# Patient Record
Sex: Female | Born: 1960
Health system: Southern US, Community
[De-identification: ages and names within clinical notes are randomized; demographics above are authoritative.]

## PROBLEM LIST (undated history)

## (undated) DIAGNOSIS — C801 Malignant (primary) neoplasm, unspecified: Secondary | ICD-10-CM

## (undated) DIAGNOSIS — N950 Postmenopausal bleeding: Secondary | ICD-10-CM

## (undated) DIAGNOSIS — Z803 Family history of malignant neoplasm of breast: Secondary | ICD-10-CM

## (undated) DIAGNOSIS — M419 Scoliosis, unspecified: Secondary | ICD-10-CM

## (undated) DIAGNOSIS — Z808 Family history of malignant neoplasm of other organs or systems: Secondary | ICD-10-CM

## (undated) DIAGNOSIS — Z9889 Other specified postprocedural states: Secondary | ICD-10-CM

## (undated) DIAGNOSIS — R112 Nausea with vomiting, unspecified: Secondary | ICD-10-CM

## (undated) DIAGNOSIS — Z923 Personal history of irradiation: Secondary | ICD-10-CM

## (undated) DIAGNOSIS — Z973 Presence of spectacles and contact lenses: Secondary | ICD-10-CM

## (undated) DIAGNOSIS — Z800 Family history of malignant neoplasm of digestive organs: Secondary | ICD-10-CM

## (undated) DIAGNOSIS — I10 Essential (primary) hypertension: Secondary | ICD-10-CM

## (undated) HISTORY — DX: Family history of malignant neoplasm of breast: Z80.3

## (undated) HISTORY — PX: DILATION AND CURETTAGE OF UTERUS: SHX78

## (undated) HISTORY — PX: AUGMENTATION MAMMAPLASTY: SUR837

## (undated) HISTORY — DX: Family history of malignant neoplasm of other organs or systems: Z80.8

## (undated) HISTORY — DX: Essential (primary) hypertension: I10

## (undated) HISTORY — PX: BREAST SURGERY: SHX581

## (undated) HISTORY — PX: TONSILLECTOMY: SUR1361

## (undated) HISTORY — DX: Family history of malignant neoplasm of digestive organs: Z80.0

## (undated) HISTORY — PX: REDUCTION MAMMAPLASTY: SUR839

---
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
## 1998-02-25 ENCOUNTER — Other Ambulatory Visit: Admission: RE | Admit: 1998-02-25 | Discharge: 1998-02-25 | Payer: Self-pay | Admitting: *Deleted

## 1998-07-26 ENCOUNTER — Inpatient Hospital Stay (HOSPITAL_COMMUNITY): Admission: AD | Admit: 1998-07-26 | Discharge: 1998-07-26 | Payer: Self-pay | Admitting: *Deleted

## 1998-11-09 ENCOUNTER — Encounter: Payer: Self-pay | Admitting: Emergency Medicine

## 1998-11-09 ENCOUNTER — Emergency Department (HOSPITAL_COMMUNITY): Admission: EM | Admit: 1998-11-09 | Discharge: 1998-11-09 | Payer: Self-pay | Admitting: Internal Medicine

## 1998-11-26 ENCOUNTER — Encounter: Admission: RE | Admit: 1998-11-26 | Discharge: 1999-02-24 | Payer: Self-pay | Admitting: *Deleted

## 1998-12-30 ENCOUNTER — Inpatient Hospital Stay (HOSPITAL_COMMUNITY): Admission: AD | Admit: 1998-12-30 | Discharge: 1998-12-30 | Payer: Self-pay | Admitting: Obstetrics and Gynecology

## 1999-01-17 ENCOUNTER — Inpatient Hospital Stay (HOSPITAL_COMMUNITY): Admission: AD | Admit: 1999-01-17 | Discharge: 1999-01-17 | Payer: Self-pay | Admitting: Obstetrics and Gynecology

## 1999-01-31 ENCOUNTER — Inpatient Hospital Stay (HOSPITAL_COMMUNITY): Admission: AD | Admit: 1999-01-31 | Discharge: 1999-02-03 | Payer: Self-pay | Admitting: Obstetrics & Gynecology

## 1999-03-11 ENCOUNTER — Other Ambulatory Visit: Admission: RE | Admit: 1999-03-11 | Discharge: 1999-03-11 | Payer: Self-pay | Admitting: *Deleted

## 2000-03-10 ENCOUNTER — Other Ambulatory Visit: Admission: RE | Admit: 2000-03-10 | Discharge: 2000-03-10 | Payer: Self-pay | Admitting: *Deleted

## 2001-03-29 ENCOUNTER — Other Ambulatory Visit: Admission: RE | Admit: 2001-03-29 | Discharge: 2001-03-29 | Payer: Self-pay | Admitting: *Deleted

## 2002-04-25 ENCOUNTER — Other Ambulatory Visit: Admission: RE | Admit: 2002-04-25 | Discharge: 2002-04-25 | Payer: Self-pay | Admitting: Obstetrics and Gynecology

## 2002-09-20 ENCOUNTER — Other Ambulatory Visit: Admission: RE | Admit: 2002-09-20 | Discharge: 2002-09-20 | Payer: Self-pay | Admitting: Obstetrics and Gynecology

## 2003-04-16 ENCOUNTER — Encounter: Payer: Self-pay | Admitting: Obstetrics and Gynecology

## 2003-04-16 ENCOUNTER — Ambulatory Visit (HOSPITAL_COMMUNITY): Admission: RE | Admit: 2003-04-16 | Discharge: 2003-04-16 | Payer: Self-pay | Admitting: Obstetrics and Gynecology

## 2003-04-18 ENCOUNTER — Encounter: Admission: RE | Admit: 2003-04-18 | Discharge: 2003-04-18 | Payer: Self-pay | Admitting: Obstetrics and Gynecology

## 2003-04-18 ENCOUNTER — Encounter: Payer: Self-pay | Admitting: Obstetrics and Gynecology

## 2003-05-10 ENCOUNTER — Other Ambulatory Visit: Admission: RE | Admit: 2003-05-10 | Discharge: 2003-05-10 | Payer: Self-pay | Admitting: Obstetrics and Gynecology

## 2004-05-19 ENCOUNTER — Encounter: Admission: RE | Admit: 2004-05-19 | Discharge: 2004-05-19 | Payer: Self-pay | Admitting: Obstetrics and Gynecology

## 2004-05-27 ENCOUNTER — Encounter: Admission: RE | Admit: 2004-05-27 | Discharge: 2004-05-27 | Payer: Self-pay | Admitting: Obstetrics and Gynecology

## 2005-06-11 ENCOUNTER — Encounter: Admission: RE | Admit: 2005-06-11 | Discharge: 2005-06-11 | Payer: Self-pay | Admitting: Obstetrics and Gynecology

## 2006-06-16 ENCOUNTER — Encounter: Admission: RE | Admit: 2006-06-16 | Discharge: 2006-06-16 | Payer: Self-pay | Admitting: Obstetrics and Gynecology

## 2006-07-05 ENCOUNTER — Encounter: Admission: RE | Admit: 2006-07-05 | Discharge: 2006-07-05 | Payer: Self-pay | Admitting: Obstetrics and Gynecology

## 2006-07-13 ENCOUNTER — Encounter: Admission: RE | Admit: 2006-07-13 | Discharge: 2006-07-13 | Payer: Self-pay | Admitting: Obstetrics and Gynecology

## 2006-07-27 ENCOUNTER — Encounter: Admission: RE | Admit: 2006-07-27 | Discharge: 2006-07-27 | Payer: Self-pay | Admitting: General Surgery

## 2006-07-30 ENCOUNTER — Ambulatory Visit (HOSPITAL_BASED_OUTPATIENT_CLINIC_OR_DEPARTMENT_OTHER): Admission: RE | Admit: 2006-07-30 | Discharge: 2006-07-30 | Payer: Self-pay | Admitting: General Surgery

## 2006-07-30 ENCOUNTER — Encounter (INDEPENDENT_AMBULATORY_CARE_PROVIDER_SITE_OTHER): Payer: Self-pay | Admitting: Specialist

## 2006-07-30 ENCOUNTER — Encounter: Admission: RE | Admit: 2006-07-30 | Discharge: 2006-07-30 | Payer: Self-pay | Admitting: General Surgery

## 2007-07-27 ENCOUNTER — Encounter: Admission: RE | Admit: 2007-07-27 | Discharge: 2007-07-27 | Payer: Self-pay | Admitting: Obstetrics and Gynecology

## 2008-07-30 ENCOUNTER — Encounter: Admission: RE | Admit: 2008-07-30 | Discharge: 2008-07-30 | Payer: Self-pay | Admitting: Obstetrics and Gynecology

## 2009-07-31 ENCOUNTER — Encounter: Admission: RE | Admit: 2009-07-31 | Discharge: 2009-07-31 | Payer: Self-pay | Admitting: Obstetrics and Gynecology

## 2009-08-07 ENCOUNTER — Encounter: Admission: RE | Admit: 2009-08-07 | Discharge: 2009-08-07 | Payer: Self-pay | Admitting: Obstetrics and Gynecology

## 2010-02-26 ENCOUNTER — Encounter: Admission: RE | Admit: 2010-02-26 | Discharge: 2010-02-26 | Payer: Self-pay | Admitting: Obstetrics and Gynecology

## 2010-06-30 ENCOUNTER — Encounter: Payer: Self-pay | Admitting: Internal Medicine

## 2010-09-17 ENCOUNTER — Encounter
Admission: RE | Admit: 2010-09-17 | Discharge: 2010-09-17 | Payer: Self-pay | Source: Home / Self Care | Attending: Obstetrics and Gynecology | Admitting: Obstetrics and Gynecology

## 2010-09-30 NOTE — Letter (Signed)
Summary: Pre Visit Letter Revised  [facility]  [address] [place], [place] [address]   Phone: [phone]  Fax: [phone]        06/30/2010 MRN: [id number]  [facility] [address] [place], [place]  [address]             Procedure Date:  12-14 at 9:30am           Dr Dalene Carrow to the Gastroenterology Division at [facility].    You are scheduled to see a nurse for your pre-procedure visit on 07-30-10 at 11am on the 3rd floor at [facility], [address]  We ask that you try to arrive at our office 15 minutes prior to your appointment time to allow for check-in.  Please take a minute to review the attached form.  If you answer "Yes" to one or more of the questions on the first page, we ask that you call the person listed at your earliest opportunity.  If you answer "No" to all of the questions, please complete the rest of the form and bring it to your appointment.    Your nurse visit will consist of discussing your medical and surgical history, your immediate family medical history, and your medications.   If you are unable to list all of your medications on the form, please bring the medication bottles to your appointment and we will list them.  We will need to be aware of both prescribed and over the counter drugs.  We will need to know exact dosage information as well.    Please be prepared to read and sign documents such as consent forms, a financial agreement, and acknowledgement forms.  If necessary, and with your consent, a friend or relative is welcome to sit-in on the nurse visit with you.  Please bring your insurance card so that we may make a copy of it.  If your insurance requires a referral to see a specialist, please bring your referral form from your primary care physician.  No co-pay is required for this nurse visit.     If you cannot keep your appointment, please call [phone] to cancel or reschedule prior to your  appointment date.  This allows [place] the opportunity to schedule an appointment for another patient in need of care.    Thank you for choosing [facility] for your medical needs.  We appreciate the opportunity to care for you.  Please visit [place] at our website  to learn more about our practice.  Sincerely, The Gastroenterology Division

## 2011-01-16 NOTE — Op Note (Signed)
[name], Graham             ACCOUNT NO.:  [id number]   MEDICAL RECORD NO.:  [id number]          PATIENT TYPE:  AMB   LOCATION:  DSC                          FACILITY:  [facility]   PHYSICIAN:  Angelia Mould. Derrell Lolling, M.D.DATE OF BIRTH:  Feb 20, 1961   DATE OF PROCEDURE:  07/30/2006  DATE OF DISCHARGE:                               OPERATIVE REPORT   PREOPERATIVE DIAGNOSIS:  Atypical ductal hyperplasia, right breast.   POSTOPERATIVE DIAGNOSIS:  Atypical ductal hyperplasia, right breast.   OPERATION PERFORMED:  Excisional biopsy calcifications, right breast.   SURGEON:  Angelia Mould. Derrell Lolling, M.D.   OPERATIVE INDICATIONS:  This is a 50 year old white female who had  screening mammograms which showed suspicious calcifications in the right  breast upper outer quadrant.  She had vacuum-assisted large core needle  biopsy of the right breast upper outer quadrant showing fibrocystic  change with microcalcifications and focal atypical ductal hyperplasia.  There was no evidence of ductal carcinoma in situ.  Dr. Cain Saupe  was concerned about sampling her and asked [place] to consider excision of  this area.  The patient has been examined and counseled as an  outpatient.  I told the patient this was a reasonable approach, and she  was brought to operating room electively.   OPERATIVE TECHNIQUE:  The patient underwent wire guide needle  localization at [facility] by Dr. Adriana Reams this  morning.  The wire was in good position.  The films were reviewed  preoperatively.  The patient was taken to operating room.  She underwent  monitoring and fairly significant sedation by the anesthesia department.  The right breast was prepped and draped in sterile fashion.  One percent  Xylocaine with epinephrine was used as a local infiltration anesthetic.  The insertion wire was in the upper outer quadrant laterally.  I made a  curved incision in the upper outer quadrant near the wire.   This  incision paralleled the areolar margin which was more medially located.  Dissection was carried down into the breast tissue and around the wire  guide.  The specimen was marked with silk sutures to mark superficial  and lateral margins.  Specimen mammography was performed.  Dr. Adriana Reams called back and said that we had completely excised the area in  question.  The specimen was sent to pathology.  Hemostasis was excellent  and achieved with electrocautery.  The wound was irrigated with saline.  The deeper breast tissues were closed with interrupted sutures of 3-0  Vicryl and  the skin closed with a running subcuticular suture of 4-0 Monocryl and  Steri-Strips.  Clean bandages were placed and the patient taken to the  recovery room in stable condition.  Estimated blood loss was about 10 to  15 mL.  Complications none.  Sponge, needle and instrument counts were  correct.      Angelia Mould. Derrell Lolling, M.D.  Electronically Signed     HMI/MEDQ  D:  07/30/2006  T:  07/31/2006  Job:  [id number]   cc:   Burnell Blanks, M.D.  [facility]

## 2011-09-08 ENCOUNTER — Other Ambulatory Visit: Payer: Self-pay | Admitting: Obstetrics and Gynecology

## 2011-09-08 DIAGNOSIS — Z1231 Encounter for screening mammogram for malignant neoplasm of breast: Secondary | ICD-10-CM

## 2011-10-01 ENCOUNTER — Ambulatory Visit
Admission: RE | Admit: 2011-10-01 | Discharge: 2011-10-01 | Disposition: A | Discharge status: Home / Self Care | Payer: BC Managed Care – PPO | Source: Ambulatory Visit | Attending: Obstetrics and Gynecology | Admitting: Obstetrics and Gynecology

## 2011-10-01 DIAGNOSIS — Z1231 Encounter for screening mammogram for malignant neoplasm of breast: Secondary | ICD-10-CM

## 2012-09-27 ENCOUNTER — Other Ambulatory Visit: Payer: Self-pay | Admitting: Obstetrics and Gynecology

## 2012-09-27 DIAGNOSIS — Z1231 Encounter for screening mammogram for malignant neoplasm of breast: Secondary | ICD-10-CM

## 2012-10-21 ENCOUNTER — Ambulatory Visit
Admission: RE | Admit: 2012-10-21 | Discharge: 2012-10-21 | Disposition: A | Discharge status: Home / Self Care | Payer: BC Managed Care – PPO | Source: Ambulatory Visit | Attending: Obstetrics and Gynecology | Admitting: Obstetrics and Gynecology

## 2012-10-21 DIAGNOSIS — Z1231 Encounter for screening mammogram for malignant neoplasm of breast: Secondary | ICD-10-CM

## 2012-10-24 ENCOUNTER — Other Ambulatory Visit: Payer: Self-pay | Admitting: Obstetrics and Gynecology

## 2012-10-24 DIAGNOSIS — R928 Other abnormal and inconclusive findings on diagnostic imaging of breast: Secondary | ICD-10-CM

## 2012-11-17 ENCOUNTER — Ambulatory Visit
Admission: RE | Admit: 2012-11-17 | Discharge: 2012-11-17 | Disposition: A | Discharge status: Home / Self Care | Payer: BC Managed Care – PPO | Source: Ambulatory Visit | Attending: Obstetrics and Gynecology | Admitting: Obstetrics and Gynecology

## 2012-11-17 DIAGNOSIS — R928 Other abnormal and inconclusive findings on diagnostic imaging of breast: Secondary | ICD-10-CM

## 2012-11-21 ENCOUNTER — Other Ambulatory Visit: Payer: Self-pay | Admitting: Obstetrics and Gynecology

## 2012-11-21 DIAGNOSIS — R920 Mammographic microcalcification found on diagnostic imaging of breast: Secondary | ICD-10-CM

## 2012-12-12 ENCOUNTER — Inpatient Hospital Stay
Admission: RE | Admit: 2012-12-12 | Discharge: 2012-12-12 | Disposition: A | Discharge status: Home / Self Care | Payer: BC Managed Care – PPO | Source: Ambulatory Visit | Attending: Obstetrics and Gynecology | Admitting: Obstetrics and Gynecology

## 2013-04-20 ENCOUNTER — Other Ambulatory Visit: Payer: Self-pay | Admitting: Obstetrics and Gynecology

## 2013-04-20 DIAGNOSIS — R920 Mammographic microcalcification found on diagnostic imaging of breast: Secondary | ICD-10-CM

## 2013-05-15 ENCOUNTER — Ambulatory Visit
Admission: RE | Admit: 2013-05-15 | Discharge: 2013-05-15 | Disposition: A | Discharge status: Home / Self Care | Payer: BC Managed Care – PPO | Source: Ambulatory Visit | Attending: Obstetrics and Gynecology | Admitting: Obstetrics and Gynecology

## 2013-05-15 DIAGNOSIS — R920 Mammographic microcalcification found on diagnostic imaging of breast: Secondary | ICD-10-CM

## 2013-10-20 ENCOUNTER — Other Ambulatory Visit: Payer: Self-pay | Admitting: Obstetrics and Gynecology

## 2013-10-20 DIAGNOSIS — R921 Mammographic calcification found on diagnostic imaging of breast: Secondary | ICD-10-CM

## 2013-11-13 ENCOUNTER — Other Ambulatory Visit: Payer: Self-pay | Admitting: Obstetrics and Gynecology

## 2013-11-13 ENCOUNTER — Ambulatory Visit
Admission: RE | Admit: 2013-11-13 | Discharge: 2013-11-13 | Disposition: A | Discharge status: Home / Self Care | Payer: BC Managed Care – PPO | Source: Ambulatory Visit | Attending: Obstetrics and Gynecology | Admitting: Obstetrics and Gynecology

## 2013-11-13 DIAGNOSIS — R921 Mammographic calcification found on diagnostic imaging of breast: Secondary | ICD-10-CM

## 2013-11-20 ENCOUNTER — Ambulatory Visit
Admission: RE | Admit: 2013-11-20 | Discharge: 2013-11-20 | Disposition: A | Discharge status: Home / Self Care | Payer: BC Managed Care – PPO | Source: Ambulatory Visit | Attending: Obstetrics and Gynecology | Admitting: Obstetrics and Gynecology

## 2013-11-20 DIAGNOSIS — R921 Mammographic calcification found on diagnostic imaging of breast: Secondary | ICD-10-CM

## 2013-11-21 ENCOUNTER — Other Ambulatory Visit: Payer: Self-pay | Admitting: Obstetrics and Gynecology

## 2013-11-21 ENCOUNTER — Other Ambulatory Visit (HOSPITAL_COMMUNITY): Payer: Self-pay | Admitting: Diagnostic Radiology

## 2013-11-21 DIAGNOSIS — N6091 Unspecified benign mammary dysplasia of right breast: Secondary | ICD-10-CM

## 2013-11-28 ENCOUNTER — Ambulatory Visit (INDEPENDENT_AMBULATORY_CARE_PROVIDER_SITE_OTHER): Payer: BC Managed Care – PPO | Admitting: Surgery

## 2013-11-29 ENCOUNTER — Other Ambulatory Visit: Payer: BC Managed Care – PPO

## 2014-01-02 ENCOUNTER — Other Ambulatory Visit (INDEPENDENT_AMBULATORY_CARE_PROVIDER_SITE_OTHER): Payer: Self-pay | Admitting: General Surgery

## 2014-01-02 ENCOUNTER — Encounter (INDEPENDENT_AMBULATORY_CARE_PROVIDER_SITE_OTHER): Payer: Self-pay | Admitting: General Surgery

## 2014-01-02 ENCOUNTER — Ambulatory Visit (INDEPENDENT_AMBULATORY_CARE_PROVIDER_SITE_OTHER): Payer: BC Managed Care – PPO | Admitting: General Surgery

## 2014-01-02 VITALS — BP 126/80 | HR 77 | Temp 98.5°F | Ht 63.0 in | Wt 165.0 lb

## 2014-01-02 DIAGNOSIS — N6091 Unspecified benign mammary dysplasia of right breast: Secondary | ICD-10-CM | POA: Insufficient documentation

## 2014-01-02 DIAGNOSIS — N6089 Other benign mammary dysplasias of unspecified breast: Secondary | ICD-10-CM

## 2014-01-04 NOTE — Progress Notes (Signed)
Patient ID: Natasha Graham, female   DOB: 09-14-1960, 53 y.o.   MRN: [id number]  Chief Complaint  Patient presents with  . eval right breast    HPI Natasha Graham is a 53 y.o. female.  Referred by Dr Wandalee Ferdinand HPI 53 yof who has multiple mammograms that I reviewed. She has had followup for quite a while. She did have a stereotactic biopsy of right breast calcifications in November 2007 with a subsequent wire localization of some of these calcifications which showed atypical ductal hyperplasia. She was not evaluated for any risk reduction measures at that time. She underwent a mammogram last year that showed  breast density C. This was read as a BI-RADS 3 mammogram with calcifications in the outer right breast that have remained stable. She was discussed an MRI at that point. She was then seen in March of 2015 with some calcifications in the lateral right breast changed. This underwent a biopsy that showed atypical ductal hyperplasia. She was also recommended an MRI again. She comes down today to discuss this. The entire area of calcification measures 7.8 x 3.4 x 1.2 cm.  Past Medical History  Diagnosis Date  . Hypertension     Past Surgical History  Procedure Laterality Date  . Brain surgery    . Scoliosis  1972    Family History  Problem Relation Age of Onset  . Cancer Father     liver    Social History History  Substance Use Topics  . Smoking status: Never Smoker   . Smokeless tobacco: Not on file  . Alcohol Use: No    No Known Allergies  Current Outpatient Prescriptions  Medication Sig Dispense Refill  . CINNAMON PO Take by mouth.      . lactobacillus acidophilus (BACID) TABS tablet Take 2 tablets by mouth 3 (three) times daily.      Marland Kitchen lisinopril-hydrochlorothiazide (PRINZIDE,ZESTORETIC) 20-25 MG per tablet       . Multiple Vitamin (MULTIVITAMIN) tablet Take 1 tablet by mouth daily.      . Turmeric 500 MG CAPS Take by mouth.       No current facility-administered  medications for this visit.    Review of Systems Review of Systems  Constitutional: Negative for fever, chills and unexpected weight change.  HENT: Negative for congestion, hearing loss, sore throat, trouble swallowing and voice change.   Eyes: Negative for visual disturbance.  Respiratory: Negative for cough and wheezing.   Cardiovascular: Negative for chest pain, palpitations and leg swelling.  Gastrointestinal: Negative for nausea, vomiting, abdominal pain, diarrhea, constipation, blood in stool, abdominal distention and anal bleeding.  Genitourinary: Negative for hematuria, vaginal bleeding and difficulty urinating.  Musculoskeletal: Negative for arthralgias.  Skin: Negative for rash and wound.  Neurological: Negative for seizures, syncope and headaches.  Hematological: Negative for adenopathy. Does not bruise/bleed easily.  Psychiatric/Behavioral: Negative for confusion.    Blood pressure 126/80, pulse 77, temperature 98.5 F (36.9 C), height 5\' 3"  (1.6 m), weight 165 lb (74.844 kg).  Physical Exam Physical Exam  Vitals reviewed. Constitutional: She appears well-developed and well-nourished.  Eyes: No scleral icterus.  Cardiovascular: Normal rate, regular rhythm and normal heart sounds.   Pulmonary/Chest: Effort normal and breath sounds normal. She has no wheezes. She has no rales. Right breast exhibits no inverted nipple, no mass, no nipple discharge, no skin change and no tenderness. Left breast exhibits no inverted nipple, no mass, no nipple discharge, no skin change and no tenderness.  Lymphadenopathy:    She  has no cervical adenopathy.    She has no axillary adenopathy.       Right: No supraclavicular adenopathy present.       Left: No supraclavicular adenopathy present.    Data Reviewed  ADDENDUM REPORT: 01/02/2014 14:16  ADDENDUM:  The patient's imaging and biopsy results were reviewed with Dr.  Donne Hazel on 01/12/2014. The currently demonstrated biopsy marker   clip is at the anterior, lateral aspect of a 2.2 x 1.7 x 1.2 cm  group of a large number of tiny microcalcifications deep in the  upper-outer quadrant of the right breast. This is the area of recent  concern, biopsied with stereotactic guidance, yielding atypical  ductal hyperplasia. Surgical excision of this group of  microcalcifications is recommended.  Additional calcifications elsewhere in the upper outer right breast,  extending to the retroareolar region, are mammographically stable  over a long period of time time with previous stereotactic and  excisional biopsies yielding atypical ductal hyperplasia. The  recently biopsied calcifications and previously biopsied  calcifications together span an area measuring 7.8 x 3.4 x 1.2 cm in  maximum dimensions.  Electronically Signed  By: Enrique Sack M.D.  On: 01/02/2014 14:16        Arline Asp, MD Tue Nov 21, 2013 10:26:29 AM EDT       ADDENDUM REPORT: 11/21/2013 10:24  ADDENDUM:  I spoke with the patient by telephone on 11/21/2013 at 10:15 a.m. to  discuss her pathology results. Pathology demonstrates calcifications  with atypical ductal hyperplasia, which is concordant with the  imaging appearance. MRI is recommended for optimal surgical planning  purposes given the extent of mammographically evident  calcifications. The patient reports a previous MRI that she is found  to be a challenging experience but I described the procedure in  detail and she feels she will be able to complete it without  difficulty. Surgical consultation has been scheduled with Dr. Dalbert Batman  on 12/05/2013 at 3:40 p.m. All questions were answered. She will be  contacted regarding MRI schedule time.     EXAM:  DIGITAL DIAGNOSTIC RIGHT MAMMOGRAM WITH CAD  COMPARISON: May 15, 2013, November 25, 2012, October 21, 2012,  October 01, 2011, September 17, 2010  ACR Breast Density Category b: There are scattered areas of  fibroglandular density.   FINDINGS:  Cc and MLO views of bilateral breasts, spot magnification cc and  lateral views of the right breast are submitted. There is a grouping  of indeterminate microcalcifications in the lateral right breast.  Mammographic images were processed with CAD.  IMPRESSION:  Suspicious findings.  RECOMMENDATION:  Stereotactic core biopsy right breast calcifications.   Assessment    Right breast adh   Plan     Right breast radioactive seed guided excisional biopsy of the suspicious calcifications  I reviewed all of her films with Dr. Joneen Caraway in the breast center. There are a number of calcifications in the outer right breast that remained stable but a smaller area that have changed. These are the areas of atypical ductal hyperplasia on biopsy. I don't think that an MRI is necessary. I don't know that that will improve any diagnostic capability with this. She also does not want to undergo an MRI for a variety of reasons including some concerns about her rods. She would rather not have this done. We're not going to proceed with an MRI. We discussed proceeding with a localized excision of these abnormal calcifications and then decided where to go after this. We discussed  the risks and benefits associated with this.       Rolm Bookbinder 01/04/2014, 2:52 PM

## 2014-02-20 ENCOUNTER — Encounter (HOSPITAL_BASED_OUTPATIENT_CLINIC_OR_DEPARTMENT_OTHER): Payer: Self-pay | Admitting: *Deleted

## 2014-02-20 NOTE — Progress Notes (Signed)
To come for labs and ekg Friday when she comes for seeds

## 2014-02-23 ENCOUNTER — Ambulatory Visit
Admission: RE | Admit: 2014-02-23 | Discharge: 2014-02-23 | Disposition: A | Discharge status: Home / Self Care | Payer: BC Managed Care – PPO | Source: Ambulatory Visit | Attending: General Surgery | Admitting: General Surgery

## 2014-02-23 ENCOUNTER — Encounter (HOSPITAL_BASED_OUTPATIENT_CLINIC_OR_DEPARTMENT_OTHER)
Admission: RE | Admit: 2014-02-23 | Discharge: 2014-02-23 | Disposition: A | Discharge status: Home / Self Care | Payer: BC Managed Care – PPO | Source: Ambulatory Visit | Attending: General Surgery | Admitting: General Surgery

## 2014-02-23 DIAGNOSIS — N6091 Unspecified benign mammary dysplasia of right breast: Secondary | ICD-10-CM

## 2014-02-23 LAB — CBC WITH DIFFERENTIAL/PLATELET
Basophils Absolute: 0 10*3/uL (ref 0.0–0.1)
Basophils Relative: 1 % (ref 0–1)
Eosinophils Absolute: 0.1 10*3/uL (ref 0.0–0.7)
Eosinophils Relative: 2 % (ref 0–5)
HEMATOCRIT: 38 % (ref 36.0–46.0)
HEMOGLOBIN: 13.2 g/dL (ref 12.0–15.0)
LYMPHS ABS: 1.7 10*3/uL (ref 0.7–4.0)
Lymphocytes Relative: 29 % (ref 12–46)
MCH: 31.3 pg (ref 26.0–34.0)
MCHC: 34.7 g/dL (ref 30.0–36.0)
MCV: 90 fL (ref 78.0–100.0)
MONOS PCT: 7 % (ref 3–12)
Monocytes Absolute: 0.4 10*3/uL (ref 0.1–1.0)
NEUTROS ABS: 3.7 10*3/uL (ref 1.7–7.7)
Neutrophils Relative %: 61 % (ref 43–77)
Platelets: 271 10*3/uL (ref 150–400)
RBC: 4.22 MIL/uL (ref 3.87–5.11)
RDW: 12 % (ref 11.5–15.5)
WBC: 6 10*3/uL (ref 4.0–10.5)

## 2014-02-23 LAB — BASIC METABOLIC PANEL
BUN: 9 mg/dL (ref 6–23)
CHLORIDE: 101 meq/L (ref 96–112)
CO2: 30 mEq/L (ref 19–32)
Calcium: 9.1 mg/dL (ref 8.4–10.5)
Creatinine, Ser: 0.74 mg/dL (ref 0.50–1.10)
GFR calc non Af Amer: >90 mL/min (ref >90)
GLUCOSE: 108 mg/dL — AB (ref 70–99)
POTASSIUM: 4 meq/L (ref 3.7–5.3)
Sodium: 142 mEq/L (ref 137–147)

## 2014-02-26 ENCOUNTER — Ambulatory Visit (HOSPITAL_BASED_OUTPATIENT_CLINIC_OR_DEPARTMENT_OTHER)
Admission: RE | Admit: 2014-02-26 | Discharge: 2014-02-26 | Disposition: A | Discharge status: Home / Self Care | Payer: BC Managed Care – PPO | Source: Ambulatory Visit | Attending: General Surgery | Admitting: General Surgery

## 2014-02-26 ENCOUNTER — Ambulatory Visit
Admission: RE | Admit: 2014-02-26 | Discharge: 2014-02-26 | Disposition: A | Discharge status: Home / Self Care | Payer: BC Managed Care – PPO | Source: Ambulatory Visit | Attending: General Surgery | Admitting: General Surgery

## 2014-02-26 ENCOUNTER — Ambulatory Visit (HOSPITAL_BASED_OUTPATIENT_CLINIC_OR_DEPARTMENT_OTHER): Payer: BC Managed Care – PPO | Admitting: Certified Registered"

## 2014-02-26 ENCOUNTER — Encounter (HOSPITAL_BASED_OUTPATIENT_CLINIC_OR_DEPARTMENT_OTHER): Payer: Self-pay

## 2014-02-26 ENCOUNTER — Encounter (HOSPITAL_BASED_OUTPATIENT_CLINIC_OR_DEPARTMENT_OTHER): Payer: BC Managed Care – PPO | Admitting: Certified Registered"

## 2014-02-26 ENCOUNTER — Encounter (HOSPITAL_BASED_OUTPATIENT_CLINIC_OR_DEPARTMENT_OTHER)
Admission: RE | Disposition: A | Discharge status: Home / Self Care | Payer: Self-pay | Source: Ambulatory Visit | Attending: General Surgery

## 2014-02-26 DIAGNOSIS — I10 Essential (primary) hypertension: Secondary | ICD-10-CM | POA: Insufficient documentation

## 2014-02-26 DIAGNOSIS — Z79899 Other long term (current) drug therapy: Secondary | ICD-10-CM | POA: Insufficient documentation

## 2014-02-26 DIAGNOSIS — N6091 Unspecified benign mammary dysplasia of right breast: Secondary | ICD-10-CM

## 2014-02-26 DIAGNOSIS — D0590 Unspecified type of carcinoma in situ of unspecified breast: Secondary | ICD-10-CM | POA: Insufficient documentation

## 2014-02-26 HISTORY — DX: Presence of spectacles and contact lenses: Z97.3

## 2014-02-26 SURGERY — RADIOACTIVE SEED GUIDED BREAST BIOPSY
Anesthesia: General | Site: Breast | Laterality: Right

## 2014-02-26 MED ORDER — LACTATED RINGERS IV SOLN
INTRAVENOUS | Status: DC | PRN
  Administered 2014-02-26 (×2): via INTRAVENOUS

## 2014-02-26 MED ORDER — OXYCODONE-ACETAMINOPHEN 10-325 MG PO TABS: 1.0000 | ORAL_TABLET | Freq: Four times a day (QID) | ORAL | Status: DC | PRN

## 2014-02-26 MED ORDER — FENTANYL CITRATE 0.05 MG/ML IJ SOLN
INTRAMUSCULAR | Status: DC | PRN
  Administered 2014-02-26 (×2): 25 ug via INTRAVENOUS
  Administered 2014-02-26: 50 ug via INTRAVENOUS
  Administered 2014-02-26: 25 ug via INTRAVENOUS

## 2014-02-26 MED ORDER — CEFAZOLIN SODIUM-DEXTROSE 2-3 GM-% IV SOLR
2.0000 g | INTRAVENOUS | Status: AC
  Administered 2014-02-26: 2 g via INTRAVENOUS

## 2014-02-26 MED ORDER — LIDOCAINE HCL (CARDIAC) 20 MG/ML IV SOLN
INTRAVENOUS | Status: DC | PRN
  Administered 2014-02-26: 60 mg via INTRAVENOUS

## 2014-02-26 MED ORDER — PROPOFOL 10 MG/ML IV BOLUS
INTRAVENOUS | Status: DC | PRN
  Administered 2014-02-26: 150 mg via INTRAVENOUS

## 2014-02-26 MED ORDER — ONDANSETRON HCL 4 MG/2ML IJ SOLN
INTRAMUSCULAR | Status: DC | PRN
  Administered 2014-02-26: 4 mg via INTRAVENOUS

## 2014-02-26 MED ORDER — OXYCODONE HCL 5 MG PO TABS: 5.0000 mg | ORAL_TABLET | Freq: Once | ORAL | Status: DC | PRN

## 2014-02-26 MED ORDER — FENTANYL CITRATE 0.05 MG/ML IJ SOLN: 50.0000 ug | INTRAMUSCULAR | Status: DC | PRN

## 2014-02-26 MED ORDER — MIDAZOLAM HCL 2 MG/2ML IJ SOLN: 1.0000 mg | INTRAMUSCULAR | Status: DC | PRN

## 2014-02-26 MED ORDER — OXYCODONE HCL 5 MG/5ML PO SOLN: 5.0000 mg | Freq: Once | ORAL | Status: DC | PRN

## 2014-02-26 MED ORDER — LACTATED RINGERS IV SOLN: INTRAVENOUS | Status: DC

## 2014-02-26 MED ORDER — BUPIVACAINE HCL (PF) 0.25 % IJ SOLN
INTRAMUSCULAR | Status: DC | PRN
  Administered 2014-02-26: 20 mL

## 2014-02-26 MED ORDER — MIDAZOLAM HCL 2 MG/2ML IJ SOLN
INTRAMUSCULAR | Status: AC
  Filled 2014-02-26: qty 2

## 2014-02-26 MED ORDER — PROPOFOL 10 MG/ML IV EMUL
INTRAVENOUS | Status: AC
  Filled 2014-02-26: qty 50

## 2014-02-26 MED ORDER — MIDAZOLAM HCL 5 MG/5ML IJ SOLN
INTRAMUSCULAR | Status: DC | PRN
  Administered 2014-02-26: 2 mg via INTRAVENOUS

## 2014-02-26 MED ORDER — HYDROMORPHONE HCL PF 1 MG/ML IJ SOLN: 0.2500 mg | INTRAMUSCULAR | Status: DC | PRN

## 2014-02-26 MED ORDER — ONDANSETRON HCL 4 MG/2ML IJ SOLN: 4.0000 mg | Freq: Once | INTRAMUSCULAR | Status: DC | PRN

## 2014-02-26 MED ORDER — EPHEDRINE SULFATE 50 MG/ML IJ SOLN
INTRAMUSCULAR | Status: DC | PRN
  Administered 2014-02-26: 10 mg via INTRAVENOUS

## 2014-02-26 MED ORDER — DEXAMETHASONE SODIUM PHOSPHATE 4 MG/ML IJ SOLN
INTRAMUSCULAR | Status: DC | PRN
  Administered 2014-02-26: 10 mg via INTRAVENOUS

## 2014-02-26 MED ORDER — CEFAZOLIN SODIUM-DEXTROSE 2-3 GM-% IV SOLR
INTRAVENOUS | Status: AC
  Filled 2014-02-26: qty 50

## 2014-02-26 MED ORDER — FENTANYL CITRATE 0.05 MG/ML IJ SOLN
INTRAMUSCULAR | Status: AC
  Filled 2014-02-26: qty 6

## 2014-02-26 MED ORDER — BUPIVACAINE HCL (PF) 0.25 % IJ SOLN
INTRAMUSCULAR | Status: AC
  Filled 2014-02-26: qty 30

## 2014-02-26 SURGICAL SUPPLY — 57 items
APPLIER CLIP 9.375 MED OPEN (MISCELLANEOUS)
BENZOIN TINCTURE PRP APPL 2/3 (GAUZE/BANDAGES/DRESSINGS) IMPLANT
BINDER BREAST LRG (GAUZE/BANDAGES/DRESSINGS) ×2 IMPLANT
BINDER BREAST MEDIUM (GAUZE/BANDAGES/DRESSINGS) IMPLANT
BINDER BREAST XLRG (GAUZE/BANDAGES/DRESSINGS) IMPLANT
BINDER BREAST XXLRG (GAUZE/BANDAGES/DRESSINGS) IMPLANT
BLADE SURG 15 STRL LF DISP TIS (BLADE) ×1 IMPLANT
BLADE SURG 15 STRL SS (BLADE) ×1
CANISTER SUC SOCK COL 7IN (MISCELLANEOUS) IMPLANT
CANISTER SUCT 1200ML W/VALVE (MISCELLANEOUS) IMPLANT
CHLORAPREP W/TINT 26ML (MISCELLANEOUS) ×2 IMPLANT
CLIP APPLIE 9.375 MED OPEN (MISCELLANEOUS) IMPLANT
COVER MAYO STAND STRL (DRAPES) ×2 IMPLANT
COVER PROBE W GEL 5X96 (DRAPES) ×2 IMPLANT
COVER TABLE BACK 60X90 (DRAPES) ×2 IMPLANT
DECANTER SPIKE VIAL GLASS SM (MISCELLANEOUS) IMPLANT
DERMABOND ADVANCED (GAUZE/BANDAGES/DRESSINGS) ×1
DERMABOND ADVANCED .7 DNX12 (GAUZE/BANDAGES/DRESSINGS) ×1 IMPLANT
DEVICE DUBIN W/COMP PLATE 8390 (MISCELLANEOUS) ×2 IMPLANT
DRAPE PED LAPAROTOMY (DRAPES) ×2 IMPLANT
DRSG TEGADERM 4X4.75 (GAUZE/BANDAGES/DRESSINGS) IMPLANT
ELECT COATED BLADE 2.86 ST (ELECTRODE) ×2 IMPLANT
ELECT REM PT RETURN 9FT ADLT (ELECTROSURGICAL) ×2
ELECTRODE REM PT RTRN 9FT ADLT (ELECTROSURGICAL) ×1 IMPLANT
GLOVE BIO SURGEON STRL SZ7 (GLOVE) ×4 IMPLANT
GLOVE BIOGEL PI IND STRL 7.0 (GLOVE) ×1 IMPLANT
GLOVE BIOGEL PI IND STRL 7.5 (GLOVE) ×1 IMPLANT
GLOVE BIOGEL PI INDICATOR 7.0 (GLOVE) ×1
GLOVE BIOGEL PI INDICATOR 7.5 (GLOVE) ×1
GLOVE EXAM NITRILE MD LF STRL (GLOVE) ×2 IMPLANT
GLOVE SURG SS PI 7.0 STRL IVOR (GLOVE) ×2 IMPLANT
GOWN STRL REUS W/ TWL LRG LVL3 (GOWN DISPOSABLE) ×2 IMPLANT
GOWN STRL REUS W/TWL LRG LVL3 (GOWN DISPOSABLE) ×2
KIT MARKER MARGIN INK (KITS) ×2 IMPLANT
NEEDLE HYPO 25X1 1.5 SAFETY (NEEDLE) ×2 IMPLANT
NS IRRIG 1000ML POUR BTL (IV SOLUTION) IMPLANT
PACK BASIN DAY SURGERY FS (CUSTOM PROCEDURE TRAY) ×2 IMPLANT
PENCIL BUTTON HOLSTER BLD 10FT (ELECTRODE) ×2 IMPLANT
SHEET MEDIUM DRAPE 40X70 STRL (DRAPES) IMPLANT
SLEEVE SCD COMPRESS KNEE MED (MISCELLANEOUS) ×2 IMPLANT
SPONGE GAUZE 4X4 12PLY STER LF (GAUZE/BANDAGES/DRESSINGS) IMPLANT
SPONGE LAP 4X18 X RAY DECT (DISPOSABLE) ×2 IMPLANT
STAPLER VISISTAT 35W (STAPLE) IMPLANT
STRIP CLOSURE SKIN 1/2X4 (GAUZE/BANDAGES/DRESSINGS) ×2 IMPLANT
SUT MNCRL AB 4-0 PS2 18 (SUTURE) ×2 IMPLANT
SUT MON AB 5-0 PS2 18 (SUTURE) IMPLANT
SUT SILK 2 0 SH (SUTURE) IMPLANT
SUT VIC AB 2-0 SH 27 (SUTURE) ×1
SUT VIC AB 2-0 SH 27XBRD (SUTURE) ×1 IMPLANT
SUT VIC AB 3-0 SH 27 (SUTURE) ×1
SUT VIC AB 3-0 SH 27X BRD (SUTURE) ×1 IMPLANT
SUT VIC AB 5-0 PS2 18 (SUTURE) IMPLANT
SYR CONTROL 10ML LL (SYRINGE) ×2 IMPLANT
TOWEL OR 17X24 6PK STRL BLUE (TOWEL DISPOSABLE) ×2 IMPLANT
TOWEL OR NON WOVEN STRL DISP B (DISPOSABLE) ×2 IMPLANT
TUBE CONNECTING 20X1/4 (TUBING) IMPLANT
YANKAUER SUCT BULB TIP NO VENT (SUCTIONS) IMPLANT

## 2014-02-26 NOTE — H&P (Signed)
37 yof who has multiple mammograms that I reviewed. She has had followup for quite a while. She did have a stereotactic biopsy of right breast calcifications in November 2007 with a subsequent wire localization of some of these calcifications which showed atypical ductal hyperplasia. She was not evaluated for any risk reduction measures at that time. She underwent a mammogram last year that showed breast density C. This was read as a BI-RADS 3 mammogram with calcifications in the outer right breast that have remained stable. She was discussed an MRI at that point. She was then seen in March of 2015 with some calcifications in the lateral right breast changed. This underwent a biopsy that showed atypical ductal hyperplasia. The entire area of calcification measures 7.8 x 3.4 x 1.2 cm.   Past Medical History   Diagnosis  Date   .  Hypertension     Past Surgical History   Procedure  Laterality  Date   .  Brain surgery     .  Scoliosis   1972    Family History   Problem  Relation  Age of Onset   .  Cancer  Father      liver   Social History  History   Substance Use Topics   .  Smoking status:  Never Smoker   .  Smokeless tobacco:  Not on file   .  Alcohol Use:  No   No Known Allergies  Current Outpatient Prescriptions   Medication  Sig  Dispense  Refill   .  CINNAMON PO  Take by mouth.     .  lactobacillus acidophilus (BACID) TABS tablet  Take 2 tablets by mouth 3 (three) times daily.     Marland Kitchen  lisinopril-hydrochlorothiazide (PRINZIDE,ZESTORETIC) 20-25 MG per tablet      .  Multiple Vitamin (MULTIVITAMIN) tablet  Take 1 tablet by mouth daily.     .  Turmeric 500 MG CAPS  Take by mouth.      No current facility-administered medications for this visit.   Review of Systems  Review of Systems  Constitutional: Negative for fever, chills and unexpected weight change.  Respiratory: Negative for cough and wheezing.  Cardiovascular: Negative for chest pain, palpitations and leg swelling.    Blood pressure 126/80, pulse 77, temperature 98.5 F (36.9 C), height 5\' 3"  (1.6 m), weight 165 lb (74.844 kg).  Physical Exam  Physical Exam  Vitals reviewed.  Constitutional: She appears well-developed and well-nourished.  Eyes: No scleral icterus.  Cardiovascular: Normal rate, regular rhythm and normal heart sounds.  Pulmonary/Chest: Effort normal and breath sounds normal. She has no wheezes. She has no rales. Right breast exhibits no inverted nipple, no mass, no nipple discharge, no skin change and no tenderness. Left breast exhibits no inverted nipple, no mass, no nipple discharge, no skin change and no tenderness.   Data Reviewed   ADDENDUM REPORT: 01/02/2014 14:16  ADDENDUM:  The patient's imaging and biopsy results were reviewed with Dr.  Donne Hazel on 01/12/2014. The currently demonstrated biopsy marker  clip is at the anterior, lateral aspect of a 2.2 x 1.7 x 1.2 cm  group of a large number of tiny microcalcifications deep in the  upper-outer quadrant of the right breast. This is the area of recent  concern, biopsied with stereotactic guidance, yielding atypical  ductal hyperplasia. Surgical excision of this group of  microcalcifications is recommended.  Additional calcifications elsewhere in the upper outer right breast,  extending to the retroareolar region, are mammographically  stable  over a long period of time time with previous stereotactic and  excisional biopsies yielding atypical ductal hyperplasia. The  recently biopsied calcifications and previously biopsied  calcifications together span an area measuring 7.8 x 3.4 x 1.2 cm in  maximum dimensions.  Electronically Signed  By: Enrique Sack M.D.  On: 01/02/2014 14:16        Arline Asp, MD Tue Nov 21, 2013 10:26:29 AM EDT        ADDENDUM REPORT: 11/21/2013 10:24  ADDENDUM:  I spoke with the patient by telephone on 11/21/2013 at 10:15 a.m. to  discuss her pathology results. Pathology demonstrates  calcifications  with atypical ductal hyperplasia, which is concordant with the  imaging appearance. MRI is recommended for optimal surgical planning  purposes given the extent of mammographically evident  calcifications. The patient reports a previous MRI that she is found  to be a challenging experience but I described the procedure in  detail and she feels she will be able to complete it without  difficulty. Surgical consultation has been scheduled with Dr. Dalbert Batman  on 12/05/2013 at 3:40 p.m. All questions were answered. She will be  contacted regarding MRI schedule time.   EXAM:  DIGITAL DIAGNOSTIC RIGHT MAMMOGRAM WITH CAD  COMPARISON: May 15, 2013, November 25, 2012, October 21, 2012,  October 01, 2011, September 17, 2010  ACR Breast Density Category b: There are scattered areas of  fibroglandular density.  FINDINGS:  Cc and MLO views of bilateral breasts, spot magnification cc and  lateral views of the right breast are submitted. There is a grouping  of indeterminate microcalcifications in the lateral right breast.  Mammographic images were processed with CAD.  IMPRESSION:  Suspicious findings.  RECOMMENDATION:  Stereotactic core biopsy right breast calcifications.  Assessment  Right breast adh   Plan  Right breast radioactive seed guided excisional biopsy of the suspicious calcifications  I reviewed all of her films with Dr. Joneen Caraway in the breast center. There are a number of calcifications in the outer right breast that remained stable but a smaller area that have changed. These are the areas of atypical ductal hyperplasia on biopsy. I don't think that an MRI is necessary. I don't know that that will improve any diagnostic capability with this. She also does not want to undergo an MRI for a variety of reasons including some concerns about her rods. She would rather not have this done. We're not going to proceed with an MRI. We discussed proceeding with a localized excision of these  abnormal calcifications and then decided where to go after this. We discussed the risks and benefits associated with this.

## 2014-02-26 NOTE — Op Note (Signed)
Preoperative diagnosis: ADH on right breast core biopsy Postoperative diagnosis: same as above Procedure: Right breast radioactive seed guided excisional biopsy Surgeon: Dr Serita Grammes EBL: minimal Anesthesia: general Specimen: right breast tissue marked with paint Complications: none Sponge and needle count correct times two Disposition to recovery stable  Indications: This is a 65 yof who has stable calcifications and a change that underwent a biopsy showing atypical ductal hyperplasia.  We discussed radioactive seed guided excision.  Procedure: After informed consent was obtained the patient was taken to the operating room.  She had previously had a radioactive seed placed by radiology. I confirmed its presence prior to beginning. She was given cefazolin. Sequential compression devices were on her legs. She was placed under general anesthesia without complication. Her right breast was prepped and draped in the standard sterile surgical fashion. A surgical timeout was then performed.  I identified the location of the seed. I then used a prior incision in the right lateral breast. I used the neoprobe to excise the seat and the surrounding tissue. This was all the way down to her chest wall. I confirmed the presence of the seed in the specimen with the neoprobe.  I then marked this with paint. A Faxitron mammogram was taken confirming removal of the radioactive seed as well as the calcifications. There were some calcifications at the edge of the area I removed but due to the fact that this was atypical ductal hyperplasia I elected not to remove more tissue and the deep margin is her pectoralis muscle. I then obtained hemostasis. I closed the breast tissue with a 2-0 Vicryl. The dermis with a 3-0 Vicryl. The skin was closed 4-0 Monocryl. Dermabond was placed over this. The entire area was infiltrated with Marcaine. She tolerated this well was extubated and transferred to recovery stable. A breast  binder was also placed.

## 2014-02-26 NOTE — Transfer of Care (Signed)
Immediate Anesthesia Transfer of Care Note  Patient: Natasha Graham  Procedure(s) Performed: Procedure(s): RADIOACTIVE SEED GUIDED EXCISIONAL BREAST BIOPSY (Right)  Patient Location: PACU  Anesthesia Type:General  Level of Consciousness: awake, alert , oriented and patient cooperative  Airway & Oxygen Therapy: Patient Spontanous Breathing and Patient connected to face mask oxygen  Post-op Assessment: Report given to PACU RN and Post -op Vital signs reviewed and stable  Post vital signs: Reviewed and stable  Complications: No apparent anesthesia complications

## 2014-02-26 NOTE — Anesthesia Postprocedure Evaluation (Signed)
  Anesthesia Post-op Note  Patient: Natasha Graham  Procedure(s) Performed: Procedure(s): RADIOACTIVE SEED GUIDED EXCISIONAL BREAST BIOPSY (Right)  Patient Location: PACU  Anesthesia Type:General  Level of Consciousness: awake, alert  and oriented  Airway and Oxygen Therapy: Patient Spontanous Breathing  Post-op Pain: none  Post-op Assessment: Post-op Vital signs reviewed  Post-op Vital Signs: Reviewed  Last Vitals:  Filed Vitals:   02/26/14 0915  BP: 116/67  Pulse: 66  Temp:   Resp: 11    Complications: No apparent anesthesia complications

## 2014-02-26 NOTE — Discharge Instructions (Signed)
[facility] °Office Phone Number [phone] ° °BREAST BIOPSY/ PARTIAL MASTECTOMY: POST OP INSTRUCTIONS ° °Always review your discharge instruction sheet given to you by the facility where your surgery was performed. ° °IF YOU HAVE DISABILITY OR FAMILY LEAVE FORMS, YOU MUST BRING THEM TO THE OFFICE FOR PROCESSING.  DO NOT GIVE THEM TO YOUR DOCTOR. ° °1. A prescription for pain medication may be given to you upon discharge.  Take your pain medication as prescribed, if needed.  If narcotic pain medicine is not needed, then you may take acetaminophen (Tylenol), naprosyn (Alleve) or ibuprofen (Advil) as needed. °2. Take your usually prescribed medications unless otherwise directed °3. If you need a refill on your pain medication, please contact your pharmacy.  They will contact our office to request authorization.  Prescriptions will not be filled after 5pm or on week-ends. °4. You should eat very light the first 24 hours after surgery, such as soup, crackers, pudding, etc.  Resume your normal diet the day after surgery. °5. Most patients will experience some swelling and bruising in the breast.  Ice packs and a good support bra will help.  Wear the breast binder provided or a sports bra for 72 hours day and night.  After that wear a sports bra during the day until you return to the office. Swelling and bruising can take several days to resolve.  °6. It is common to experience some constipation if taking pain medication after surgery.  Increasing fluid intake and taking a stool softener will usually help or prevent this problem from occurring.  A mild laxative (Milk of Magnesia or Miralax) should be taken according to package directions if there are no bowel movements after 48 hours. °7. Unless discharge instructions indicate otherwise, you may remove your bandages 48 hours after surgery and you may shower at that time.  You may have steri-strips (small skin tapes) in place directly over the incision.   These strips should be left on the skin for 7-10 days and will come off on their own.  If your surgeon used skin glue on the incision, you may shower in 24 hours.  The glue will flake off over the next 2-3 weeks.  Any sutures or staples will be removed at the office during your follow-up visit. °8. ACTIVITIES:  You may resume regular daily activities (gradually increasing) beginning the next day.  Wearing a good support bra or sports bra minimizes pain and swelling.  You may have sexual intercourse when it is comfortable. °a. You may drive when you no longer are taking prescription pain medication, you can comfortably wear a seatbelt, and you can safely maneuver your car and apply brakes. °b. RETURN TO WORK:  ______________________________________________________________________________________ °9. You should see your doctor in the office for a follow-up appointment approximately two weeks after your surgery.  Your doctor’s nurse will typically make your follow-up appointment when she calls you with your pathology report.  Expect your pathology report 3-4 business days after your surgery.  You may call to check if you do not hear from us after three days. °10. OTHER INSTRUCTIONS: _______________________________________________________________________________________________ _____________________________________________________________________________________________________________________________________ °_____________________________________________________________________________________________________________________________________ °_____________________________________________________________________________________________________________________________________ ° °WHEN TO CALL DR WAKEFIELD: °1. Fever over 101.0 °2. Nausea and/or vomiting. °3. Extreme swelling or bruising. °4. Continued bleeding from incision. °5. Increased pain, redness, or drainage from the incision. ° °The clinic staff is available to  answer your questions during regular business hours.  Please don’t hesitate to call and ask to speak to one of the nurses for   clinical concerns.  If you have a medical emergency, go to the nearest emergency room or call 911.  A surgeon from [facility] is always on call at the hospital. ° °For further questions, please visit [email or web address] ° °Post Anesthesia Home Care Instructions ° °Activity: °Get plenty of rest for the remainder of the day. A responsible adult should stay with you for 24 hours following the procedure.  °For the next 24 hours, DO NOT: °-Drive a car °-Operate machinery °-Drink alcoholic beverages °-Take any medication unless instructed by your physician °-Make any legal decisions or sign important papers. ° °Meals: °Start with liquid foods such as gelatin or soup. Progress to regular foods as tolerated. Avoid greasy, spicy, heavy foods. If nausea and/or vomiting occur, drink only clear liquids until the nausea and/or vomiting subsides. Call your physician if vomiting continues. ° °Special Instructions/Symptoms: °Your throat may feel dry or sore from the anesthesia or the breathing tube placed in your throat during surgery. If this causes discomfort, gargle with warm salt water. The discomfort should disappear within 24 hours. ° °

## 2014-02-26 NOTE — Anesthesia Procedure Notes (Signed)
Procedure Name: LMA Insertion Date/Time: 02/26/2014 7:57 AM Performed by: BLOCKER, TIMOTHY Pre-anesthesia Checklist: Patient identified, Emergency Drugs available, Suction available and Patient being monitored Patient Re-evaluated:Patient Re-evaluated prior to inductionOxygen Delivery Method: Circle System Utilized Preoxygenation: Pre-oxygenation with 100% oxygen Intubation Type: IV induction Ventilation: Mask ventilation without difficulty LMA: LMA inserted LMA Size: 4.0 Number of attempts: 1 Airway Equipment and Method: bite block Placement Confirmation: positive ETCO2 Tube secured with: Tape Dental Injury: Teeth and Oropharynx as per pre-operative assessment

## 2014-02-26 NOTE — Anesthesia Preprocedure Evaluation (Addendum)
Anesthesia Evaluation  Patient identified by MRN, date of birth, ID band  Airway Mallampati: I TM Distance: >3 FB Neck ROM: Full    Dental  (+) Teeth Intact, Dental Advisory Given   Pulmonary  breath sounds clear to auscultation        Cardiovascular hypertension, Pt. on medications Rhythm:Regular Rate:Normal     Neuro/Psych    GI/Hepatic   Endo/Other    Renal/GU      Musculoskeletal   Abdominal   Peds  Hematology   Anesthesia Other Findings   Reproductive/Obstetrics                          Anesthesia Physical Anesthesia Plan  ASA: II  Anesthesia Plan: General   Post-op Pain Management:    Induction: Intravenous  Airway Management Planned: LMA  Additional Equipment:   Intra-op Plan:   Post-operative Plan: Extubation in OR  Informed Consent: I have reviewed the patients History and Physical, chart, labs and discussed the procedure including the risks, benefits and alternatives for the proposed anesthesia with the patient or authorized representative who has indicated his/her understanding and acceptance.   Dental advisory given  Plan Discussed with: CRNA, Anesthesiologist and Surgeon  Anesthesia Plan Comments:         Anesthesia Quick Evaluation

## 2014-03-06 ENCOUNTER — Telehealth (INDEPENDENT_AMBULATORY_CARE_PROVIDER_SITE_OTHER): Payer: Self-pay

## 2014-03-06 ENCOUNTER — Telehealth (INDEPENDENT_AMBULATORY_CARE_PROVIDER_SITE_OTHER): Payer: Self-pay | Admitting: *Deleted

## 2014-03-06 DIAGNOSIS — D0511 Intraductal carcinoma in situ of right breast: Secondary | ICD-10-CM

## 2014-03-06 NOTE — Telephone Encounter (Signed)
Called pt to notify her that we were going to place orders for post op mgm on right br along with getting bilateral breast mri scheduled. Dr Donne Hazel would like these test done before her f/u appt with Dr Donne Hazel on 7/14. We will be back in touch with the pt once we have the test scheduled. Pt understands.

## 2014-03-06 NOTE — Telephone Encounter (Signed)
Spoke to pt. Made her aware of her Mammogram 03-07-14 at [facility] and Breast MRI 03-12-14 8:00 p.m. At [facility].   Pt aware.  Anderson Malta

## 2014-03-07 ENCOUNTER — Ambulatory Visit
Admission: RE | Admit: 2014-03-07 | Discharge: 2014-03-07 | Disposition: A | Discharge status: Home / Self Care | Payer: BC Managed Care – PPO | Source: Ambulatory Visit | Attending: General Surgery | Admitting: General Surgery

## 2014-03-07 DIAGNOSIS — D0511 Intraductal carcinoma in situ of right breast: Secondary | ICD-10-CM

## 2014-03-12 ENCOUNTER — Ambulatory Visit
Admission: RE | Admit: 2014-03-12 | Discharge: 2014-03-12 | Disposition: A | Discharge status: Home / Self Care | Payer: BC Managed Care – PPO | Source: Ambulatory Visit | Attending: General Surgery | Admitting: General Surgery

## 2014-03-12 DIAGNOSIS — D0511 Intraductal carcinoma in situ of right breast: Secondary | ICD-10-CM

## 2014-03-12 MED ORDER — GADOBENATE DIMEGLUMINE 529 MG/ML IV SOLN
15.0000 mL | Freq: Once | INTRAVENOUS | Status: AC | PRN
  Administered 2014-03-12: 15 mL via INTRAVENOUS

## 2014-03-13 ENCOUNTER — Encounter (INDEPENDENT_AMBULATORY_CARE_PROVIDER_SITE_OTHER): Payer: Self-pay | Admitting: General Surgery

## 2014-03-13 ENCOUNTER — Ambulatory Visit (INDEPENDENT_AMBULATORY_CARE_PROVIDER_SITE_OTHER): Payer: BC Managed Care – PPO | Admitting: General Surgery

## 2014-03-13 VITALS — BP 122/78 | HR 76 | Temp 97.5°F | Ht 64.0 in | Wt 163.0 lb

## 2014-03-13 DIAGNOSIS — D0590 Unspecified type of carcinoma in situ of unspecified breast: Secondary | ICD-10-CM

## 2014-03-13 DIAGNOSIS — D0511 Intraductal carcinoma in situ of right breast: Secondary | ICD-10-CM

## 2014-03-13 NOTE — Progress Notes (Signed)
Subjective:     Patient ID: Natasha Graham, female   DOB: Nov 23, 1960, 53 y.o.   MRN: [id number]  HPI 53 yof who has larger area of right breast calcifications that had been stable and had a prior excisional biopsy with adh.  There was a change in a portion of the calcifications and I did an excisional biopsy of this area that the pathology shows dcis with calcs measuring 0.7 cm.  Margins are not involved but closest is the anterior at 1 mm.  This is skin though.  This is er positive at 100% and pr positive at 96%.  She is doing well and returns today without complaints.  A postop mm shows remaining amorphous calcifications within a segmental distribution anteriorly to the level of the nipple spanning 7 cm.  MRI has also been performed a fluid filled excisional cavity.  Report is below.  There are a couple separate areas of NME one of which appears to correlate with calcifications.  The left breast and nodes are all normal.  She comes in today to discuss path and options.  Review of Systems EXAM:  BILATERAL BREAST MRI WITH AND WITHOUT CONTRAST  TECHNIQUE:  Multiplanar, multisequence MR images of both breasts were obtained  prior to and following the intravenous administration of 15 ml of  Multihance.  THREE-DIMENSIONAL MR IMAGE RENDERING ON INDEPENDENT WORKSTATION:  Three-dimensional MR images were rendered by post-processing of the  original MR data on an independent workstation. The  three-dimensional MR images were interpreted, and findings are  reported in the following complete MRI report for this study. Three  dimensional images were evaluated at the independent DynaCad  workstation.  COMPARISON: Bilateral breast MRI 07/13/2006. Mammography  03/07/2014, 02/22/2014, dating back to 06/16/2006.  FINDINGS:  Breast composition: b. Scattered fibroglandular tissue.  Background parenchymal enhancement: Mild.  Right breast: Approximate 4.8 x 2.6 x 2.3 cm fluid-filled excisional  cavity in the  lower outer right breast extending back to the  pectoralis muscle, with expected rim enhancement related to  granulation tissue. Immediately adjacent to the cavity along its  anterior-superior margin is a focus of non mass enhancement  demonstrating plateau kinetics measuring approximately 1.9 x 1.8 x  1.6 cm; on the prior mammograms, this is in the area of the scar  from the prior excision in 2007. Linear non mass enhancement  demonstrating plateau and washout kinetics is present immediately  inferior to the biopsy cavity in the lower outer right breast  extending over an approximate 3.8 cm length; this likely correlates  with the calcifications identified on the most recent mammograms. No  abnormal enhancement is identified elsewhere in the right breast.  Left breast: No mass or abnormal enhancement. Enhancing tissue in  the lower outer quadrant is unchanged from the 2007 MRI.  Lymph nodes: No abnormal appearing lymph nodes.  Ancillary findings: Approximate 1.3 cm simple cyst in the anterior  segment right lobe of liver. No significant ancillary findings.  IMPRESSION:  1. Suspicious linear non mass enhancement extending over an  approximate 3.8 cm length in the lower outer quadrant of the right  breast, inferior to the excision cavity. This correlates with the  calcifications identified on the recent mammograms.  2. Non mass enhancement immediately adjacent to the excisional  cavity in the right breast along its anterior-superior margin which  correlates with the scar from the prior excision in 2007.  3. No evidence of malignancy involving the left breast.  4. No pathologic lymphadenopathy.  5. 1.3  cm simple cyst in the anterior segment right lobe of liver.  RECOMMENDATION:  Stereotactic core needle biopsy of the calcifications anterior to  the biopsy cavity in the right breast identified on recent  mammograms; alternatively, MR biopsy of the linear non mass  enhancement in the  lower outer right breast.  BI-RADS CATEGORY 4: Suspicious.     Objective:   Physical Exam Well healing right breast incision    Assessment:     Right breast dcis    Plan:     We discussed her pathology today.  This is technically excised right now but there is a more extensive area of calcs and mr abnormality that may very well need to be excised. We are going to do another radiologic biopsy prior to this.  I will discuss with radiology doing this with mm or mri.  I discussed that if this entire area needs to be excised it would be mastectomy.  Will get another biopsy and then reevaluate. We discussed the staging and pathophysiology of breast cancer. We discussed all of the different options for treatment for breast cancer including surgery, chemotherapy, radiation therapy, Herceptin, and antiestrogen therapy.  I do not think at this time she needs a sentinel node biopsy.

## 2014-03-15 ENCOUNTER — Telehealth (INDEPENDENT_AMBULATORY_CARE_PROVIDER_SITE_OTHER): Payer: Self-pay

## 2014-03-15 ENCOUNTER — Other Ambulatory Visit (INDEPENDENT_AMBULATORY_CARE_PROVIDER_SITE_OTHER): Payer: Self-pay | Admitting: General Surgery

## 2014-03-15 DIAGNOSIS — R928 Other abnormal and inconclusive findings on diagnostic imaging of breast: Secondary | ICD-10-CM

## 2014-03-15 NOTE — Telephone Encounter (Signed)
Pt called to be made aware that she was discussed at [facility] yesterday and they recommend pt getting MRI guided bx not mm bx. I advised pt that I have spoke to the [facility] about getting the MRI guided bx scheduled and she will call the pt with the appt date. I advised pt that once she gets this date set up that I will check to see if pt needs another appt with Dr Donne Hazel or if we will just call her with the results. Pt understands.

## 2014-03-16 ENCOUNTER — Telehealth (INDEPENDENT_AMBULATORY_CARE_PROVIDER_SITE_OTHER): Payer: Self-pay

## 2014-03-16 NOTE — Telephone Encounter (Signed)
Pt calling in b/c she is asking more questions about the MRI bx that is scheduled for 7/22. The pt is wanting to know what will be the next step if this area that she is getting bx turns out to be cancer. The pt wanted to know if this was discussed in the Cancer Conference this week. The pt wants to know what will be the plan if this bx shows no cancer but she still has the area of concern for calcifications what do they do to manage this long term. I explained to pt that she really needs to speak with Dr Donne Hazel again once the MRI bx is done so she can get all of her answers at that time. I explained that Dr Donne Hazel was out of the office today and Monday that I would still send a message to him letting him know that you have questions. The pt asked me if Dr Donne Hazel would consider just doing more sx on the area of concern in the breast to just remove it instead of having to go thru a MRI guided bx. I advised pt that I really think that Dr Donne Hazel would not recommend just doing more surgery without knowing more about the area in the breast first now since this has all been discussed with the doctors at the conference. I advised pt that if that was an option to begin with before the MRI bx that Dr Donne Hazel would of offered that to her before the MRI bx. I advised pt that I would send all of this to Dr Donne Hazel and he might call pt before the bx to talk to her on the phone. I went ahead and made pt a f/u appt to see Dr Donne Hazel after the MRI bx for 03/26/14 at 4:30. Pt understands.

## 2014-03-20 NOTE — Telephone Encounter (Signed)
Called pt to offer moving her appt up earlier with Dr Donne Hazel. The pt is scheduled for MRI bx on 7/22 so i r/s her appt to see Dr Donne Hazel for 7/23 at 1:30.

## 2014-03-21 ENCOUNTER — Other Ambulatory Visit: Payer: Self-pay | Admitting: Obstetrics and Gynecology

## 2014-03-21 ENCOUNTER — Ambulatory Visit
Admission: RE | Admit: 2014-03-21 | Discharge: 2014-03-21 | Disposition: A | Discharge status: Home / Self Care | Payer: BC Managed Care – PPO | Source: Ambulatory Visit | Attending: General Surgery | Admitting: General Surgery

## 2014-03-21 ENCOUNTER — Other Ambulatory Visit (INDEPENDENT_AMBULATORY_CARE_PROVIDER_SITE_OTHER): Payer: Self-pay | Admitting: General Surgery

## 2014-03-21 ENCOUNTER — Ambulatory Visit: Admission: RE | Admit: 2014-03-21 | Payer: BC Managed Care – PPO | Source: Ambulatory Visit

## 2014-03-21 DIAGNOSIS — R921 Mammographic calcification found on diagnostic imaging of breast: Secondary | ICD-10-CM

## 2014-03-21 DIAGNOSIS — R928 Other abnormal and inconclusive findings on diagnostic imaging of breast: Secondary | ICD-10-CM

## 2014-03-21 MED ORDER — GADOBENATE DIMEGLUMINE 529 MG/ML IV SOLN
15.0000 mL | Freq: Once | INTRAVENOUS | Status: AC | PRN
  Administered 2014-03-21: 15 mL via INTRAVENOUS

## 2014-03-22 ENCOUNTER — Encounter (INDEPENDENT_AMBULATORY_CARE_PROVIDER_SITE_OTHER): Payer: Self-pay | Admitting: General Surgery

## 2014-03-22 ENCOUNTER — Ambulatory Visit (INDEPENDENT_AMBULATORY_CARE_PROVIDER_SITE_OTHER): Payer: BC Managed Care – PPO | Admitting: General Surgery

## 2014-03-22 VITALS — BP 126/70 | HR 87 | Temp 97.0°F | Ht 63.0 in | Wt 164.0 lb

## 2014-03-22 DIAGNOSIS — D0590 Unspecified type of carcinoma in situ of unspecified breast: Secondary | ICD-10-CM

## 2014-03-22 DIAGNOSIS — D0511 Intraductal carcinoma in situ of right breast: Secondary | ICD-10-CM

## 2014-03-22 NOTE — Progress Notes (Signed)
Subjective:     Patient ID: Natasha Graham, female   DOB: 25-Feb-1961, 53 y.o.   MRN: [id number]  HPI 36 yof who has larger area of right breast calcifications that had been stable and had a prior excisional biopsy with adh. There was a change in a portion of the calcifications and I did an excisional biopsy of this area that the pathology shows dcis with calcs measuring 0.7 cm. Margins are not involved but closest is the anterior at 1 mm. This is skin though. This is er positive at 100% and pr positive at 96%.  A postop mm shows remaining amorphous calcifications within a segmental distribution anteriorly to the level of the nipple spanning 7 cm. MRI has also been performed a fluid filled excisional cavity. Report is below. There are a couple separate areas of NME one of which appears to correlate with calcifications. We decided after Dr Nyoka Cowden of radiologys recommendation to attempt an mr biopsy that she thought correlated with calcs.  This was attempted with report below and I have discussed with Dr Radford Pax. She now recommends attempt at stereo biopsy.  Review of Systems EXAM:  MR OF THE RIGHT BREAST WITH AND WITHOUT CONTRAST  TECHNIQUE:  Multiplanar, multisequence MR images of the right breast were  obtained prior to and following the intravenous administration of  41ml of Multihance.  THREE-DIMENSIONAL MR IMAGE RENDERING ON INDEPENDENT WORKSTATION:  Three-dimensional MR images were rendered by post-processing of the  original MR data on an independent workstation. The  three-dimensional MR images were interpreted, and findings are  reported in the following complete MRI report for this study. Three  dimensional images were evaluated at the independent DynaCad  workstation.  COMPARISON: Recent breast MRI.  FINDINGS:  Excisional biopsy cavity is seen in the posterior third of the  upper-outer/central right breast. There is thin peripheral  enhancement about the seroma, a typical finding. This  thin  enhancement extends to the skin surface overlying the anterior  aspect of the excisional biopsy cavity. Three post-contrast image  sequences were performed, at 20 seconds, 3 minutes, and 6 minutes  after contrast administration. No suspicious linear enhancement is  seen anterior to the excisional biopsy cavity on today's MRI images.  As no suspicious target for biopsy is seen, the biopsy was canceled.  This was explained to the patient and her questions were answered.  I have discussed by telephone with Dr. Donne Hazel today's findings.  Given the extensive residual calcifications in the right breast, an  additional biopsy is desired for surgical planning. Although many of  the calcifications are quite superficial and may be technically  impossible to biopsy with the stereotactic biopsy device, we will  attempt to biopsy under stereotactic guidance some calcifications in  the anterior anterior portion of the right breast (anterior to the  excisional biopsy cavity) for stereotactic biopsy.  IMPRESSION:  No MRI biopsy was performed as described above.  RECOMMENDATION:  In discussed with Dr. Donne Hazel today, our office will schedule the  patient for another stereotactic biopsy of the right breast.  BI-RADS CATEGORY 2: Benign.     Objective:   Physical Exam deferred    Assessment:     Right breast dcis     Plan:     Right breast stereo biopsy then follow up

## 2014-03-29 ENCOUNTER — Ambulatory Visit
Admission: RE | Admit: 2014-03-29 | Discharge: 2014-03-29 | Disposition: A | Discharge status: Home / Self Care | Payer: BC Managed Care – PPO | Source: Ambulatory Visit | Attending: Obstetrics and Gynecology | Admitting: Obstetrics and Gynecology

## 2014-03-29 DIAGNOSIS — R921 Mammographic calcification found on diagnostic imaging of breast: Secondary | ICD-10-CM

## 2014-03-29 DIAGNOSIS — R928 Other abnormal and inconclusive findings on diagnostic imaging of breast: Secondary | ICD-10-CM

## 2014-04-02 ENCOUNTER — Ambulatory Visit (INDEPENDENT_AMBULATORY_CARE_PROVIDER_SITE_OTHER): Payer: BC Managed Care – PPO | Admitting: General Surgery

## 2014-04-02 ENCOUNTER — Encounter (INDEPENDENT_AMBULATORY_CARE_PROVIDER_SITE_OTHER): Payer: Self-pay | Admitting: General Surgery

## 2014-04-02 DIAGNOSIS — D0511 Intraductal carcinoma in situ of right breast: Secondary | ICD-10-CM

## 2014-04-02 DIAGNOSIS — D0590 Unspecified type of carcinoma in situ of unspecified breast: Secondary | ICD-10-CM

## 2014-04-02 NOTE — Progress Notes (Signed)
Subjective:     Patient ID: Natasha Graham, female   DOB: 12/07/1960, 53 y.o.   MRN: [id number]  HPI 81 yof who has larger area of right breast calcifications that had been stable and had a prior excisional biopsy with adh. There was a change in a portion of the calcifications and I did an excisional biopsy of this area that the pathology shows dcis with calcs measuring 0.7 cm. Margins are not involved but closest is the anterior at 1 mm. This is skin though. This is er positive at 100% and pr positive at 96%. A postop mm shows remaining amorphous calcifications within a segmental distribution anteriorly to the level of the nipple spanning 7 cm. MRI has also been performed a fluid filled excisional cavity. Report is below. There are a couple separate areas of NME one of which appears to correlate with calcifications. We decided after Dr Nyoka Cowden of radiologys recommendation to attempt an mr biopsy that she thought correlated with calcs. This was attempted with report below and I have discussed with Dr Radford Pax. She has now undergone a stereo biopsy that shows dcis.  She is doing well after that.   Review of Systems  ADDENDUM REPORT: 03/30/2014 14:35  ADDENDUM:  I spoke with the patient on March 30, 2014 to discuss pathology  results from recent stereotactic guided core needle biopsy of the  right breast. Pathology results demonstrate ductal carcinoma in  situ. This is concordant with imaging findings. Patient has an  appointment to see Dr. Donne Hazel on Monday April 02, 2014. She  reports no complaints at the biopsy site.  Electronically Signed  By: Lovey Newcomer M.D.  On: 03/30/2014 14:35       Study Result    CLINICAL DATA: Patient with segmental calcifications about the  lateral aspect of the right breast. Patient recently underwent  excisional biopsy for previously biopsied calcifications within the  posterior aspect of the right breast, demonstrating ductal carcinoma  in situ. Given the extent  of the calcifications, an attempt at  biopsy of the more anterior aspect was requested.  EXAM:  RIGHT BREAST STEREOTACTIC CORE NEEDLE BIOPSY  COMPARISON: Previous exams.  FINDINGS:  The patient and I discussed the procedure of stereotactic-guided  biopsy including benefits and alternatives. We discussed the high  likelihood of a successful procedure. We discussed the risks of the  procedure including infection, bleeding, tissue injury, clip  migration, and inadequate sampling. Informed written consent was  given. The usual time out protocol was performed immediately prior  to the procedure.  Using sterile technique and 2% Lidocaine as local anesthetic, under  stereotactic guidance, a 9 gauge vacuum assisted core needle biopsy  device was used to perform core needle biopsy of calcifications  within the lateral right breast anteriorly using a cranial approach.  Specimen radiograph was performed showing calcifications. Specimens  with calcifications are identified for pathology.  At the conclusion of the procedure, a coil shaped tissue marker clip  was deployed into the biopsy cavity. Follow-up 2-view mammogram  confirmed clip in appropriate position.  IMPRESSION:  Stereotactic-guided biopsy of right breast calcifications. No  apparent complications.       Objective:   Physical Exam Healed right breast incision with ecchymosis from prior biopsy      Assessment:     Right breast dcis     Plan:     There are now two separate areas in this larger area of calcifications that are dcis.  I think she requires mastectomy  for this and discussed possibly a nsm via inframammary incision. i will confirm with radiology clearance of nipple and areola. Will also discuss with plastic surgery her recent incision and healing. She will see plastic surgery first for reconstruction options. I don't think she will require radiation as this is likely a large area of dcis. She does understand that other  therapies are possible though and that invasive disease may be present.  I will plan on sentinel node biopsy at the same time.  She also understands that antiestrogen therapy will be indicated.   We discussed the staging and pathophysiology of breast cancer.   We discussed a sentinel lymph node biopsy as she does not appear to having lymph node involvement right now. We discussed the performance of that with injection of radioactive tracer and blue dye. We discussed that she would have an incision underneath her axillary hairline. We discussed that there is a bout a 10-20% chance of having a positive node with a sentinel lymph node biopsy and we will await the permanent pathology to make any other first further decisions in terms of her treatment. One of these options might be to return to the operating room to perform an axillary lymph node dissection. We discussed about a 1-2% risk lifetime of chronic shoulder pain as well as lymphedema associated with a sentinel lymph node biopsy.  We discussed the risks of operation including bleeding, infection, possible reoperation. She understands her further therapy will be based on what her stages at the time of her operation.

## 2014-04-06 ENCOUNTER — Other Ambulatory Visit (INDEPENDENT_AMBULATORY_CARE_PROVIDER_SITE_OTHER): Payer: Self-pay | Admitting: General Surgery

## 2014-04-06 DIAGNOSIS — D0511 Intraductal carcinoma in situ of right breast: Secondary | ICD-10-CM

## 2014-05-08 ENCOUNTER — Other Ambulatory Visit (HOSPITAL_COMMUNITY): Payer: BC Managed Care – PPO

## 2014-05-08 ENCOUNTER — Encounter (HOSPITAL_COMMUNITY): Payer: Self-pay

## 2014-05-09 ENCOUNTER — Other Ambulatory Visit (INDEPENDENT_AMBULATORY_CARE_PROVIDER_SITE_OTHER): Payer: Self-pay | Admitting: General Surgery

## 2014-05-09 ENCOUNTER — Encounter (HOSPITAL_COMMUNITY)
Admission: RE | Admit: 2014-05-09 | Discharge: 2014-05-09 | Disposition: A | Discharge status: Home / Self Care | Payer: BC Managed Care – PPO | Source: Ambulatory Visit | Attending: General Surgery | Admitting: General Surgery

## 2014-05-09 ENCOUNTER — Encounter (HOSPITAL_COMMUNITY)
Admission: RE | Admit: 2014-05-09 | Discharge: 2014-05-09 | Disposition: A | Discharge status: Home / Self Care | Payer: BC Managed Care – PPO | Source: Ambulatory Visit | Attending: Anesthesiology | Admitting: Anesthesiology

## 2014-05-09 ENCOUNTER — Encounter (HOSPITAL_COMMUNITY): Payer: Self-pay

## 2014-05-09 DIAGNOSIS — Z01812 Encounter for preprocedural laboratory examination: Secondary | ICD-10-CM | POA: Diagnosis present

## 2014-05-09 DIAGNOSIS — D0590 Unspecified type of carcinoma in situ of unspecified breast: Secondary | ICD-10-CM | POA: Insufficient documentation

## 2014-05-09 DIAGNOSIS — Z01810 Encounter for preprocedural cardiovascular examination: Secondary | ICD-10-CM | POA: Diagnosis present

## 2014-05-09 HISTORY — DX: Malignant (primary) neoplasm, unspecified: C80.1

## 2014-05-09 LAB — CBC WITH DIFFERENTIAL/PLATELET
BASOS PCT: 0 % (ref 0–1)
Basophils Absolute: 0 10*3/uL (ref 0.0–0.1)
EOS PCT: 2 % (ref 0–5)
Eosinophils Absolute: 0.1 10*3/uL (ref 0.0–0.7)
HEMATOCRIT: 39.9 % (ref 36.0–46.0)
HEMOGLOBIN: 14.2 g/dL (ref 12.0–15.0)
Lymphocytes Relative: 29 % (ref 12–46)
Lymphs Abs: 1.5 10*3/uL (ref 0.7–4.0)
MCH: 31.7 pg (ref 26.0–34.0)
MCHC: 35.6 g/dL (ref 30.0–36.0)
MCV: 89.1 fL (ref 78.0–100.0)
MONO ABS: 0.4 10*3/uL (ref 0.1–1.0)
MONOS PCT: 8 % (ref 3–12)
Neutro Abs: 3.1 10*3/uL (ref 1.7–7.7)
Neutrophils Relative %: 61 % (ref 43–77)
Platelets: 275 10*3/uL (ref 150–400)
RBC: 4.48 MIL/uL (ref 3.87–5.11)
RDW: 12.3 % (ref 11.5–15.5)
WBC: 5.1 10*3/uL (ref 4.0–10.5)

## 2014-05-09 LAB — BASIC METABOLIC PANEL
Anion gap: 12 (ref 5–15)
BUN: 11 mg/dL (ref 6–23)
CHLORIDE: 102 meq/L (ref 96–112)
CO2: 28 mEq/L (ref 19–32)
Calcium: 9.3 mg/dL (ref 8.4–10.5)
Creatinine, Ser: 0.67 mg/dL (ref 0.50–1.10)
GFR calc Af Amer: >90 mL/min (ref >90)
GFR calc non Af Amer: >90 mL/min (ref >90)
Glucose, Bld: 96 mg/dL (ref 70–99)
Potassium: 3.8 mEq/L (ref 3.7–5.3)
Sodium: 142 mEq/L (ref 137–147)

## 2014-05-09 NOTE — Pre-Procedure Instructions (Addendum)
Natasha Graham  05/09/2014   Your procedure is scheduled on:  05-16-2014  Wednesday   Report to [facility] Admitting at 6:30  AM.   Call this number if you have problems the morning of surgery: [phone]   Remember:   Do not eat food or drink liquids after midnight.   Take these medicines the morning of surgery with A SIP OF WATER: none   Do not wear jewelry, make-up or nail polish.  Do not wear lotions, powders, or perfumes. You may not wear deodorant.  Do not shave 48 hours prior to surgery.   Do not bring valuables to the hospital.  [facility] is not responsible for any belongings or valuables.               Contacts, dentures or bridgework may not be worn into surgery.   Leave suitcase in the car. After surgery it may be brought to your room.  For patients admitted to the hospital, discharge time is determined by your treatment team.               .    Special Instructions: See attached sheet for instructions of CHG shower/bath   Please read over the following fact sheets that you were given: Pain Booklet, Coughing and Deep Breathing and Surgical Site Infection Prevention

## 2014-05-09 NOTE — Progress Notes (Signed)
Dr. Cristal Generous office called for clarification of reason on consent form,has 2 procedures and no reason.  Dr. Leafy Ro office called for orders .

## 2014-05-11 ENCOUNTER — Other Ambulatory Visit: Payer: Self-pay | Admitting: Plastic Surgery

## 2014-05-11 DIAGNOSIS — C50911 Malignant neoplasm of unspecified site of right female breast: Secondary | ICD-10-CM

## 2014-05-11 NOTE — H&P (Signed)
Natasha Graham is an 53 y.o. female.   Chief Complaint: Breast Cancer HPI: The patient is a 53 yrs old female here for a history and physical for breast reconstruction. She was diagnosed with right breast cancer after an abnormal mammogram. There were changes in calcifications noted. An excisional biopsy was done with 1 mm margin. The tumor was ER and PR positive. There are still calcifications spanning 7 cm. An MRI was done. A stereotactic biopsy also showed DCIS. She had a family history in her paternal aunt with breast cancer in her 24s. She is 5 feet 3 inches tall, weighs 163 pounds and wears a 34 C bra. She is not a smoker. She is G3P3 and breast feed her children. She has scoliosis and had spinal rods placed. She has significant bruising in the right breast at present from the biopsy. No axillary nodes are palpated.   Past Medical History  Diagnosis Date  . Hypertension   . Wears glasses   . Cancer     breast cancer    Past Surgical History  Procedure Laterality Date  . Scoliosis  1972    harrington rods-age 29  . Tonsillectomy    . Dilation and curettage of uterus    . Breast surgery      right breast excisional biopsy    Family History  Problem Relation Age of Onset  . Cancer Father     liver   Social History:  reports that she has never smoked. She does not have any smokeless tobacco history on file. She reports that she does not drink alcohol or use illicit drugs.  Allergies: No Known Allergies   (Not in a hospital admission)  Results for orders placed during the hospital encounter of 05/09/14 (from the past 48 hour(s))  BASIC METABOLIC PANEL     Status: None   Collection Time    05/09/14  9:49 AM      Result Value Ref Range   Sodium 142  137 - 147 mEq/L   Potassium 3.8  3.7 - 5.3 mEq/L   Chloride 102  96 - 112 mEq/L   CO2 28  19 - 32 mEq/L   Glucose, Bld 96  70 - 99 mg/dL   BUN 11  6 - 23 mg/dL   Creatinine, Ser 0.67  0.50 - 1.10 mg/dL   Calcium 9.3  8.4 -  10.5 mg/dL   GFR calc non Af Amer >90  >90 mL/min   GFR calc Af Amer >90  >90 mL/min   Comment: (NOTE)     The eGFR has been calculated using the CKD EPI equation.     This calculation has not been validated in all clinical situations.     eGFR's persistently <90 mL/min signify possible Chronic Kidney     Disease.   Anion gap 12  5 - 15  CBC WITH DIFFERENTIAL     Status: None   Collection Time    05/09/14  9:49 AM      Result Value Ref Range   WBC 5.1  4.0 - 10.5 K/uL   RBC 4.48  3.87 - 5.11 MIL/uL   Hemoglobin 14.2  12.0 - 15.0 g/dL   HCT 39.9  36.0 - 46.0 %   MCV 89.1  78.0 - 100.0 fL   MCH 31.7  26.0 - 34.0 pg   MCHC 35.6  30.0 - 36.0 g/dL   RDW 12.3  11.5 - 15.5 %   Platelets 275  150 - 400  K/uL   Neutrophils Relative % 61  43 - 77 %   Neutro Abs 3.1  1.7 - 7.7 K/uL   Lymphocytes Relative 29  12 - 46 %   Lymphs Abs 1.5  0.7 - 4.0 K/uL   Monocytes Relative 8  3 - 12 %   Monocytes Absolute 0.4  0.1 - 1.0 K/uL   Eosinophils Relative 2  0 - 5 %   Eosinophils Absolute 0.1  0.0 - 0.7 K/uL   Basophils Relative 0  0 - 1 %   Basophils Absolute 0.0  0.0 - 0.1 K/uL   Dg Chest 2 View  05/09/2014   CLINICAL DATA:  Preoperative image, hypertension  EXAM: CHEST  2 VIEW  COMPARISON:  07/27/2006  FINDINGS: Stable thoracic spine Harrington rods. Stable mild sigmoid scoliotic curvature. Heart size and vascular pattern are normal. Lungs are clear.  IMPRESSION: No active cardiopulmonary disease.   Electronically Signed   By: Skipper Cliche M.D.   On: 05/09/2014 11:34    Review of Systems  Constitutional: Negative.   HENT: Negative.   Eyes: Negative.   Respiratory: Negative.   Cardiovascular: Negative.   Gastrointestinal: Negative.   Genitourinary: Negative.   Musculoskeletal: Negative.   Skin: Negative.   Psychiatric/Behavioral: Negative.     There were no vitals taken for this visit. Physical Exam  Constitutional: She is oriented to person, place, and time. She appears  well-developed and well-nourished.  HENT:  Head: Normocephalic and atraumatic.  Eyes: Conjunctivae and EOM are normal. Pupils are equal, round, and reactive to light.  Cardiovascular: Normal rate.   Respiratory: Effort normal.  Musculoskeletal: Normal range of motion.  Neurological: She is alert and oriented to person, place, and time.  Skin: Skin is warm.  Psychiatric: She has a normal mood and affect. Her behavior is normal. Judgment and thought content normal.     Assessment/Plan Plan for immediate breast reconstruction with placement of tissue expander and Flex HD to right breast.  SANGER,CLAIRE 05/11/2014, 8:21 AM

## 2014-05-15 MED ORDER — CEFAZOLIN SODIUM-DEXTROSE 2-3 GM-% IV SOLR
2.0000 g | INTRAVENOUS | Status: AC
  Administered 2014-05-16: 2 g via INTRAVENOUS
  Filled 2014-05-15: qty 50

## 2014-05-16 ENCOUNTER — Ambulatory Visit (HOSPITAL_COMMUNITY): Payer: BC Managed Care – PPO | Admitting: Anesthesiology

## 2014-05-16 ENCOUNTER — Encounter (HOSPITAL_COMMUNITY): Payer: Self-pay

## 2014-05-16 ENCOUNTER — Encounter (HOSPITAL_COMMUNITY): Payer: BC Managed Care – PPO | Admitting: Anesthesiology

## 2014-05-16 ENCOUNTER — Encounter (HOSPITAL_COMMUNITY)
Admission: RE | Disposition: A | Discharge status: Home / Self Care | Payer: Self-pay | Source: Ambulatory Visit | Attending: General Surgery

## 2014-05-16 ENCOUNTER — Observation Stay (HOSPITAL_COMMUNITY)
Admission: RE | Admit: 2014-05-16 | Discharge: 2014-05-17 | Disposition: A | Discharge status: Home / Self Care | Payer: BC Managed Care – PPO | Source: Ambulatory Visit | Attending: General Surgery | Admitting: General Surgery

## 2014-05-16 ENCOUNTER — Encounter (HOSPITAL_COMMUNITY)
Admission: RE | Admit: 2014-05-16 | Discharge: 2014-05-16 | Disposition: A | Discharge status: Home / Self Care | Payer: BC Managed Care – PPO | Source: Ambulatory Visit | Attending: General Surgery | Admitting: General Surgery

## 2014-05-16 DIAGNOSIS — Z800 Family history of malignant neoplasm of digestive organs: Secondary | ICD-10-CM | POA: Diagnosis not present

## 2014-05-16 DIAGNOSIS — C50911 Malignant neoplasm of unspecified site of right female breast: Secondary | ICD-10-CM

## 2014-05-16 DIAGNOSIS — Z9089 Acquired absence of other organs: Secondary | ICD-10-CM | POA: Insufficient documentation

## 2014-05-16 DIAGNOSIS — D0590 Unspecified type of carcinoma in situ of unspecified breast: Principal | ICD-10-CM | POA: Insufficient documentation

## 2014-05-16 DIAGNOSIS — I10 Essential (primary) hypertension: Secondary | ICD-10-CM | POA: Insufficient documentation

## 2014-05-16 DIAGNOSIS — M4120 Other idiopathic scoliosis, site unspecified: Secondary | ICD-10-CM | POA: Insufficient documentation

## 2014-05-16 DIAGNOSIS — R928 Other abnormal and inconclusive findings on diagnostic imaging of breast: Secondary | ICD-10-CM | POA: Insufficient documentation

## 2014-05-16 DIAGNOSIS — N6089 Other benign mammary dysplasias of unspecified breast: Secondary | ICD-10-CM | POA: Diagnosis not present

## 2014-05-16 DIAGNOSIS — D0511 Intraductal carcinoma in situ of right breast: Secondary | ICD-10-CM | POA: Diagnosis present

## 2014-05-16 HISTORY — PX: MASTECTOMY: SHX3

## 2014-05-16 HISTORY — DX: Scoliosis, unspecified: M41.9

## 2014-05-16 HISTORY — PX: BREAST RECONSTRUCTION WITH PLACEMENT OF TISSUE EXPANDER AND FLEX HD (ACELLULAR HYDRATED DERMIS): SHX6295

## 2014-05-16 LAB — BASIC METABOLIC PANEL
ANION GAP: 11 (ref 5–15)
BUN: 9 mg/dL (ref 6–23)
CO2: 27 meq/L (ref 19–32)
Calcium: 8.3 mg/dL — ABNORMAL LOW (ref 8.4–10.5)
Chloride: 98 mEq/L (ref 96–112)
Creatinine, Ser: 0.63 mg/dL (ref 0.50–1.10)
GFR calc non Af Amer: >90 mL/min (ref >90)
Glucose, Bld: 220 mg/dL — ABNORMAL HIGH (ref 70–99)
POTASSIUM: 3.5 meq/L — AB (ref 3.7–5.3)
Sodium: 136 mEq/L — ABNORMAL LOW (ref 137–147)

## 2014-05-16 LAB — CBC
HEMATOCRIT: 36.4 % (ref 36.0–46.0)
Hemoglobin: 12.9 g/dL (ref 12.0–15.0)
MCH: 31.3 pg (ref 26.0–34.0)
MCHC: 35.4 g/dL (ref 30.0–36.0)
MCV: 88.3 fL (ref 78.0–100.0)
Platelets: 241 10*3/uL (ref 150–400)
RBC: 4.12 MIL/uL (ref 3.87–5.11)
RDW: 12.3 % (ref 11.5–15.5)
WBC: 11.8 10*3/uL — ABNORMAL HIGH (ref 4.0–10.5)

## 2014-05-16 SURGERY — NIPPLE SPARING MASTECTOMY WITH SENTINAL LYMPH NODE BIOPSY AND  RECONSTRUCTION WITH PLACEMENT OF TISSUE EXPANDER
Anesthesia: General | Site: Breast | Laterality: Right

## 2014-05-16 MED ORDER — PROMETHAZINE HCL 25 MG/ML IJ SOLN: 25.0000 mg | INTRAMUSCULAR | Status: DC | PRN

## 2014-05-16 MED ORDER — MIDAZOLAM HCL 5 MG/5ML IJ SOLN
INTRAMUSCULAR | Status: DC | PRN
  Administered 2014-05-16 (×2): 1 mg via INTRAVENOUS

## 2014-05-16 MED ORDER — ROCURONIUM BROMIDE 50 MG/5ML IV SOLN
INTRAVENOUS | Status: AC
  Filled 2014-05-16: qty 1

## 2014-05-16 MED ORDER — LIDOCAINE HCL (CARDIAC) 20 MG/ML IV SOLN
INTRAVENOUS | Status: DC | PRN
  Administered 2014-05-16: 60 mg via INTRAVENOUS

## 2014-05-16 MED ORDER — FENTANYL CITRATE 0.05 MG/ML IJ SOLN
INTRAMUSCULAR | Status: AC
  Filled 2014-05-16: qty 5

## 2014-05-16 MED ORDER — ADULT MULTIVITAMIN W/MINERALS CH
1.0000 | ORAL_TABLET | Freq: Every day | ORAL | Status: DC
  Administered 2014-05-17: 1 via ORAL
  Filled 2014-05-16 (×2): qty 1

## 2014-05-16 MED ORDER — ONE-DAILY MULTI VITAMINS PO TABS: 1.0000 | ORAL_TABLET | Freq: Every day | ORAL | Status: DC

## 2014-05-16 MED ORDER — PROMETHAZINE HCL 25 MG/ML IJ SOLN
INTRAMUSCULAR | Status: AC
  Filled 2014-05-16: qty 1

## 2014-05-16 MED ORDER — ONDANSETRON HCL 4 MG/2ML IJ SOLN
4.0000 mg | Freq: Four times a day (QID) | INTRAMUSCULAR | Status: DC | PRN
  Administered 2014-05-16: 4 mg via INTRAVENOUS
  Filled 2014-05-16 (×2): qty 2

## 2014-05-16 MED ORDER — DEXAMETHASONE SODIUM PHOSPHATE 4 MG/ML IJ SOLN
INTRAMUSCULAR | Status: DC | PRN
  Administered 2014-05-16: 4 mg via INTRAVENOUS

## 2014-05-16 MED ORDER — HYDROCODONE-ACETAMINOPHEN 5-325 MG PO TABS
ORAL_TABLET | ORAL | Status: AC
  Filled 2014-05-16: qty 2

## 2014-05-16 MED ORDER — KCL IN DEXTROSE-NACL 20-5-0.45 MEQ/L-%-% IV SOLN
INTRAVENOUS | Status: AC
  Filled 2014-05-16: qty 1000

## 2014-05-16 MED ORDER — MIDAZOLAM HCL 2 MG/2ML IJ SOLN
INTRAMUSCULAR | Status: AC
  Filled 2014-05-16: qty 2

## 2014-05-16 MED ORDER — FENTANYL CITRATE 0.05 MG/ML IJ SOLN
INTRAMUSCULAR | Status: AC
  Filled 2014-05-16: qty 2

## 2014-05-16 MED ORDER — DIAZEPAM 5 MG PO TABS
ORAL_TABLET | ORAL | Status: AC
  Filled 2014-05-16: qty 1

## 2014-05-16 MED ORDER — PROPOFOL 10 MG/ML IV BOLUS
INTRAVENOUS | Status: AC
  Filled 2014-05-16: qty 20

## 2014-05-16 MED ORDER — LISINOPRIL-HYDROCHLOROTHIAZIDE 20-25 MG PO TABS
1.0000 | ORAL_TABLET | Freq: Every evening | ORAL | Status: DC
Start: 2014-05-16 — End: 2014-05-16

## 2014-05-16 MED ORDER — ONDANSETRON HCL 4 MG/2ML IJ SOLN
INTRAMUSCULAR | Status: AC
  Filled 2014-05-16: qty 2

## 2014-05-16 MED ORDER — STERILE WATER FOR INJECTION IJ SOLN
INTRAMUSCULAR | Status: AC
  Filled 2014-05-16: qty 10

## 2014-05-16 MED ORDER — HYDROCHLOROTHIAZIDE 25 MG PO TABS
25.0000 mg | ORAL_TABLET | Freq: Every evening | ORAL | Status: DC
  Administered 2014-05-16: 25 mg via ORAL
  Filled 2014-05-16 (×2): qty 1

## 2014-05-16 MED ORDER — SODIUM CHLORIDE 0.9 % IR SOLN
Status: DC | PRN
  Administered 2014-05-16: 09:00:00

## 2014-05-16 MED ORDER — DIAZEPAM 2 MG PO TABS: 2.0000 mg | ORAL_TABLET | Freq: Two times a day (BID) | ORAL | Status: DC | PRN

## 2014-05-16 MED ORDER — PHENYLEPHRINE HCL 10 MG/ML IJ SOLN
INTRAMUSCULAR | Status: DC | PRN
  Administered 2014-05-16 (×2): 80 ug via INTRAVENOUS

## 2014-05-16 MED ORDER — PROPOFOL 10 MG/ML IV BOLUS
INTRAVENOUS | Status: DC | PRN
  Administered 2014-05-16: 160 mg via INTRAVENOUS

## 2014-05-16 MED ORDER — LIDOCAINE HCL (CARDIAC) 20 MG/ML IV SOLN
INTRAVENOUS | Status: AC
  Filled 2014-05-16: qty 5

## 2014-05-16 MED ORDER — SUCCINYLCHOLINE CHLORIDE 20 MG/ML IJ SOLN
INTRAMUSCULAR | Status: AC
  Filled 2014-05-16: qty 1

## 2014-05-16 MED ORDER — ACETAMINOPHEN 650 MG RE SUPP: 650.0000 mg | Freq: Four times a day (QID) | RECTAL | Status: DC | PRN

## 2014-05-16 MED ORDER — HYDROCODONE-ACETAMINOPHEN 5-325 MG PO TABS
1.0000 | ORAL_TABLET | ORAL | Status: DC | PRN
  Administered 2014-05-16 (×2): 2 via ORAL
  Administered 2014-05-17: 1 via ORAL
  Filled 2014-05-16: qty 1
  Filled 2014-05-16: qty 2

## 2014-05-16 MED ORDER — ONDANSETRON HCL 4 MG/2ML IJ SOLN
INTRAMUSCULAR | Status: DC | PRN
  Administered 2014-05-16: 4 mg via INTRAVENOUS

## 2014-05-16 MED ORDER — PROMETHAZINE HCL 25 MG/ML IJ SOLN
12.5000 mg | INTRAMUSCULAR | Status: DC | PRN
  Filled 2014-05-16: qty 1

## 2014-05-16 MED ORDER — TECHNETIUM TC 99M SULFUR COLLOID FILTERED
1.0000 | Freq: Once | INTRAVENOUS | Status: AC | PRN
  Administered 2014-05-16: 1 via INTRADERMAL

## 2014-05-16 MED ORDER — LISINOPRIL 20 MG PO TABS
20.0000 mg | ORAL_TABLET | Freq: Every evening | ORAL | Status: DC
  Administered 2014-05-16: 20 mg via ORAL
  Filled 2014-05-16 (×2): qty 1

## 2014-05-16 MED ORDER — EPHEDRINE SULFATE 50 MG/ML IJ SOLN
INTRAMUSCULAR | Status: AC
  Filled 2014-05-16: qty 1

## 2014-05-16 MED ORDER — ACETAMINOPHEN 325 MG PO TABS: 650.0000 mg | ORAL_TABLET | Freq: Four times a day (QID) | ORAL | Status: DC | PRN

## 2014-05-16 MED ORDER — 0.9 % SODIUM CHLORIDE (POUR BTL) OPTIME
TOPICAL | Status: DC | PRN
  Administered 2014-05-16 (×3): 1000 mL

## 2014-05-16 MED ORDER — SUCCINYLCHOLINE CHLORIDE 20 MG/ML IJ SOLN
INTRAMUSCULAR | Status: DC | PRN
  Administered 2014-05-16: 100 mg via INTRAVENOUS

## 2014-05-16 MED ORDER — LACTATED RINGERS IV SOLN
INTRAVENOUS | Status: DC | PRN
  Administered 2014-05-16 (×2): via INTRAVENOUS

## 2014-05-16 MED ORDER — CEFAZOLIN SODIUM 1-5 GM-% IV SOLN
1.0000 g | Freq: Three times a day (TID) | INTRAVENOUS | Status: DC
  Administered 2014-05-16 – 2014-05-17 (×3): 1 g via INTRAVENOUS
  Filled 2014-05-16 (×5): qty 50

## 2014-05-16 MED ORDER — HYDROMORPHONE HCL PF 1 MG/ML IJ SOLN
0.2500 mg | INTRAMUSCULAR | Status: DC | PRN
  Administered 2014-05-16 (×2): 0.5 mg via INTRAVENOUS

## 2014-05-16 MED ORDER — DEXAMETHASONE SODIUM PHOSPHATE 4 MG/ML IJ SOLN
INTRAMUSCULAR | Status: AC
  Filled 2014-05-16: qty 1

## 2014-05-16 MED ORDER — MORPHINE SULFATE 2 MG/ML IJ SOLN: 2.0000 mg | INTRAMUSCULAR | Status: DC | PRN

## 2014-05-16 MED ORDER — DOCUSATE SODIUM 100 MG PO CAPS
100.0000 mg | ORAL_CAPSULE | Freq: Two times a day (BID) | ORAL | Status: DC
  Administered 2014-05-16 – 2014-05-17 (×2): 100 mg via ORAL
  Filled 2014-05-16 (×2): qty 1

## 2014-05-16 MED ORDER — PROMETHAZINE HCL 25 MG/ML IJ SOLN
6.2500 mg | Freq: Four times a day (QID) | INTRAMUSCULAR | Status: AC | PRN
  Administered 2014-05-16: 6.25 mg via INTRAVENOUS

## 2014-05-16 MED ORDER — HYDROMORPHONE HCL 1 MG/ML IJ SOLN
INTRAMUSCULAR | Status: AC
  Filled 2014-05-16: qty 1

## 2014-05-16 MED ORDER — KCL IN DEXTROSE-NACL 20-5-0.45 MEQ/L-%-% IV SOLN
INTRAVENOUS | Status: DC
  Filled 2014-05-16 (×5): qty 1000

## 2014-05-16 MED ORDER — FENTANYL CITRATE 0.05 MG/ML IJ SOLN
INTRAMUSCULAR | Status: DC | PRN
  Administered 2014-05-16 (×2): 50 ug via INTRAVENOUS
  Administered 2014-05-16: 100 ug via INTRAVENOUS
  Administered 2014-05-16: 25 ug via INTRAVENOUS
  Administered 2014-05-16 (×5): 50 ug via INTRAVENOUS

## 2014-05-16 MED ORDER — ONDANSETRON HCL 4 MG/2ML IJ SOLN
INTRAMUSCULAR | Status: AC
  Administered 2014-05-16: 4 mg
  Filled 2014-05-16: qty 2

## 2014-05-16 MED ORDER — ARTIFICIAL TEARS OP OINT
TOPICAL_OINTMENT | OPHTHALMIC | Status: DC | PRN
  Administered 2014-05-16: 1 via OPHTHALMIC

## 2014-05-16 MED ORDER — ARTIFICIAL TEARS OP OINT
TOPICAL_OINTMENT | OPHTHALMIC | Status: AC
  Filled 2014-05-16: qty 3.5

## 2014-05-16 SURGICAL SUPPLY — 85 items
APPLIER CLIP 9.375 MED OPEN (MISCELLANEOUS) ×2
BAG DECANTER FOR FLEXI CONT (MISCELLANEOUS) ×2 IMPLANT
BINDER BREAST LRG (GAUZE/BANDAGES/DRESSINGS) IMPLANT
BINDER BREAST XLRG (GAUZE/BANDAGES/DRESSINGS) ×2 IMPLANT
BIOPATCH RED 1 DISK 7.0 (GAUZE/BANDAGES/DRESSINGS) ×2 IMPLANT
BLADE 10 SAFETY STRL DISP (BLADE) IMPLANT
CANISTER SUCTION 2500CC (MISCELLANEOUS) ×2 IMPLANT
CHLORAPREP W/TINT 26ML (MISCELLANEOUS) ×4 IMPLANT
CLIP APPLIE 9.375 MED OPEN (MISCELLANEOUS) ×1 IMPLANT
CONT SPEC 4OZ CLIKSEAL STRL BL (MISCELLANEOUS) ×4 IMPLANT
COVER PROBE W GEL 5X96 (DRAPES) ×2 IMPLANT
COVER SURGICAL LIGHT HANDLE (MISCELLANEOUS) ×4 IMPLANT
DERMABOND ADVANCED (GAUZE/BANDAGES/DRESSINGS) ×1
DERMABOND ADVANCED .7 DNX12 (GAUZE/BANDAGES/DRESSINGS) ×1 IMPLANT
DRAIN CHANNEL 19F RND (DRAIN) ×2 IMPLANT
DRAPE INCISE IOBAN 66X45 STRL (DRAPES) IMPLANT
DRAPE LAPAROSCOPIC ABDOMINAL (DRAPES) IMPLANT
DRAPE ORTHO SPLIT 77X108 STRL (DRAPES) ×2
DRAPE PROXIMA HALF (DRAPES) ×4 IMPLANT
DRAPE SURG 17X23 STRL (DRAPES) IMPLANT
DRAPE SURG ORHT 6 SPLT 77X108 (DRAPES) ×2 IMPLANT
DRAPE WARM FLUID 44X44 (DRAPE) ×2 IMPLANT
DRSG MEPILEX BORDER 4X12 (GAUZE/BANDAGES/DRESSINGS) IMPLANT
DRSG PAD ABDOMINAL 8X10 ST (GAUZE/BANDAGES/DRESSINGS) ×4 IMPLANT
ELECT BLADE 4.0 EZ CLEAN MEGAD (MISCELLANEOUS) ×6
ELECT BLADE 6.5 EXT (BLADE) ×6 IMPLANT
ELECT CAUTERY BLADE 6.4 (BLADE) ×4 IMPLANT
ELECT REM PT RETURN 9FT ADLT (ELECTROSURGICAL) ×2
ELECTRODE BLDE 4.0 EZ CLN MEGD (MISCELLANEOUS) ×3 IMPLANT
ELECTRODE REM PT RTRN 9FT ADLT (ELECTROSURGICAL) ×1 IMPLANT
EVACUATOR SILICONE 100CC (DRAIN) ×2 IMPLANT
GAUZE SPONGE 4X4 12PLY STRL (GAUZE/BANDAGES/DRESSINGS) IMPLANT
GLOVE BIO SURGEON STRL SZ 6 (GLOVE) ×2 IMPLANT
GLOVE BIO SURGEON STRL SZ 6.5 (GLOVE) ×10 IMPLANT
GLOVE BIO SURGEON STRL SZ7 (GLOVE) ×6 IMPLANT
GLOVE BIO SURGEON STRL SZ8 (GLOVE) ×4 IMPLANT
GLOVE BIOGEL PI IND STRL 7.0 (GLOVE) ×2 IMPLANT
GLOVE BIOGEL PI IND STRL 7.5 (GLOVE) ×1 IMPLANT
GLOVE BIOGEL PI INDICATOR 7.0 (GLOVE) ×2
GLOVE BIOGEL PI INDICATOR 7.5 (GLOVE) ×1
GLOVE SURG SS PI 7.0 STRL IVOR (GLOVE) ×2 IMPLANT
GOWN STRL REUS W/ TWL LRG LVL3 (GOWN DISPOSABLE) ×5 IMPLANT
GOWN STRL REUS W/TWL LRG LVL3 (GOWN DISPOSABLE) ×5
GRAFT FLEX HD 4X16 THICK (Tissue Mesh) ×2 IMPLANT
IMPL BREAST TIS EXP M 350CC (Breast) ×1 IMPLANT
IMPLANT BREAST TIS EXP M 350CC (Breast) ×2 IMPLANT
KIT BASIN OR (CUSTOM PROCEDURE TRAY) ×4 IMPLANT
KIT MARKER MARGIN INK (KITS) ×2 IMPLANT
KIT ROOM TURNOVER OR (KITS) ×2 IMPLANT
MARKER SKIN DUAL TIP RULER LAB (MISCELLANEOUS) ×2 IMPLANT
NEEDLE 18GX1X1/2 (RX/OR ONLY) (NEEDLE) IMPLANT
NEEDLE 21 GA WING INFUSION (NEEDLE) ×2 IMPLANT
NEEDLE 22X1 1/2 (OR ONLY) (NEEDLE) IMPLANT
NEEDLE HYPO 25GX1X1/2 BEV (NEEDLE) IMPLANT
NS IRRIG 1000ML POUR BTL (IV SOLUTION) ×6 IMPLANT
PACK GENERAL/GYN (CUSTOM PROCEDURE TRAY) ×4 IMPLANT
PAD ABD 8X10 STRL (GAUZE/BANDAGES/DRESSINGS) IMPLANT
PAD ARMBOARD 7.5X6 YLW CONV (MISCELLANEOUS) ×2 IMPLANT
PENCIL BUTTON HOLSTER BLD 10FT (ELECTRODE) ×2 IMPLANT
PIN SAFETY STERILE (MISCELLANEOUS) ×2 IMPLANT
SET ASEPTIC TRANSFER (MISCELLANEOUS) IMPLANT
SPECIMEN JAR X LARGE (MISCELLANEOUS) ×2 IMPLANT
SPONGE LAP 18X18 X RAY DECT (DISPOSABLE) ×2 IMPLANT
STAPLER VISISTAT 35W (STAPLE) ×2 IMPLANT
STRIP CLOSURE SKIN 1/2X4 (GAUZE/BANDAGES/DRESSINGS) IMPLANT
SUT ETHILON 2 0 FS 18 (SUTURE) IMPLANT
SUT MNCRL AB 4-0 PS2 18 (SUTURE) ×2 IMPLANT
SUT MON AB 3-0 SH 27 (SUTURE) ×1
SUT MON AB 3-0 SH27 (SUTURE) ×1 IMPLANT
SUT MON AB 5-0 PS2 18 (SUTURE) ×2 IMPLANT
SUT PDS AB 2-0 CT1 27 (SUTURE) ×6 IMPLANT
SUT SILK 2 0 SH (SUTURE) ×4 IMPLANT
SUT SILK 3 0 SH 30 (SUTURE) IMPLANT
SUT VIC AB 3-0 54X BRD REEL (SUTURE) IMPLANT
SUT VIC AB 3-0 BRD 54 (SUTURE)
SUT VIC AB 3-0 SH 18 (SUTURE) ×2 IMPLANT
SUT VIC AB 3-0 SH 27 (SUTURE)
SUT VIC AB 3-0 SH 27X BRD (SUTURE) IMPLANT
SUT VIC AB 3-0 SH 8-18 (SUTURE) IMPLANT
SUT VICRYL 4-0 PS2 18IN ABS (SUTURE) IMPLANT
SYR BULB IRRIGATION 50ML (SYRINGE) ×2 IMPLANT
SYR CONTROL 10ML LL (SYRINGE) ×2 IMPLANT
TOWEL OR 17X24 6PK STRL BLUE (TOWEL DISPOSABLE) ×4 IMPLANT
TOWEL OR 17X26 10 PK STRL BLUE (TOWEL DISPOSABLE) ×4 IMPLANT
TRAY FOLEY CATH 14FRSI W/METER (CATHETERS) ×2 IMPLANT

## 2014-05-16 NOTE — Op Note (Signed)
Op report    DATE OF OPERATION:  05/16/2014  LOCATION: [facility]  SURGICAL DIVISION: Plastic Surgery  PREOPERATIVE DIAGNOSES:  1. Breast cancer.    POSTOPERATIVE DIAGNOSES:  1. Breast cancer.   PROCEDURE:  1. Right immediate breast reconstruction with placement of Acellular Dermal Matrix and tissue expanders.  SURGEON: Theodoro Kos, DO  ASSISTANT: Shawn Rayburn, PA  ANESTHESIA:  General.   COMPLICATIONS: None.   IMPLANTS: Right - Mentor 350 cc with 300 cc of saline placed. Acellular Dermal Matrix 4 x 16  INDICATIONS FOR PROCEDURE:  The patient, Natasha Graham, is a 53 y.o. female born on Apr 22, 1961, is here for immediate first stage breast reconstruction with placement of right tissue expander and Acellular dermal matrix. MRN: [id number]  CONSENT:  Informed consent was obtained directly from the patient. Risks, benefits and alternatives were fully discussed. Specific risks including but not limited to bleeding, infection, hematoma, seroma, scarring, pain, implant infection, implant extrusion, capsular contracture, asymmetry, wound healing problems, and need for further surgery were all discussed. The patient did have an ample opportunity to have her questions answered to her satisfaction.   DESCRIPTION OF PROCEDURE:  The patient was taken to the operating room by the general surgery team. SCDs were placed and IV antibiotics were given. The patient's chest was prepped and draped in a sterile fashion. A time out was performed and the implants to be used were identified.  A right mastectomy was performed.  Once the general surgery team had completed their portion of the case the patient was rendered to the plastic and reconstructive surgery team.  The pectoralis major muscle was lifted from the chest wall with release of the lateral edge and lateral inframammary fold.  The pocket was irrigated with antibiotic solution and hemostasis was achieved with electrocautery.  The  ADM was then prepared according to the manufacture guidelines and slits placed to help with postoperative fluid management.  The ADM was then sutured to the inferior and lateral edge of the inframammary fold with 2-0 PDS starting with an interrupted stitch and then a running stitch.  The lateral portion was sutured to with interrupted sutures after the expander was placed.  The expander was prepared according to the manufacture guidelines, the air evacuated and then it was placed under the ADM and pectoralis major muscle.  The inferior and lateral tabs were used to secure the expander to the chest wall with 2-0 PDS.  The drain was placed at the inframammary fold over the ADM and secured to the skin with 3-0 Silk.   The deep layers were closed with 3-0 Vicryl followed by 4-0 Monocryl.  The skin was closed with 5-0 Monocryl and then dermabond was applied.  The ABDs and breast binder were placed.  The patient tolerated the procedure well and there were no complications.  The patient was allowed to wake from anesthesia and taken to the recovery room in satisfactory condition.

## 2014-05-16 NOTE — H&P (View-Only) (Signed)
Natasha Graham is an 53 y.o. female.   Chief Complaint: Breast Cancer HPI: The patient is a 53 yrs old female here for a history and physical for breast reconstruction. She was diagnosed with right breast cancer after an abnormal mammogram. There were changes in calcifications noted. An excisional biopsy was done with 1 mm margin. The tumor was ER and PR positive. There are still calcifications spanning 7 cm. An MRI was done. A stereotactic biopsy also showed DCIS. She had a family history in her paternal aunt with breast cancer in her 50s. She is 5 feet 3 inches tall, weighs 163 pounds and wears a 34 C bra. She is not a smoker. She is G3P3 and breast feed her children. She has scoliosis and had spinal rods placed. She has significant bruising in the right breast at present from the biopsy. No axillary nodes are palpated.   Past Medical History  Diagnosis Date  . Hypertension   . Wears glasses   . Cancer     breast cancer    Past Surgical History  Procedure Laterality Date  . Scoliosis  1972    harrington rods-age 10  . Tonsillectomy    . Dilation and curettage of uterus    . Breast surgery      right breast excisional biopsy    Family History  Problem Relation Age of Onset  . Cancer Father     liver   Social History:  reports that she has never smoked. She does not have any smokeless tobacco history on file. She reports that she does not drink alcohol or use illicit drugs.  Allergies: No Known Allergies   (Not in a hospital admission)  Results for orders placed during the hospital encounter of 05/09/14 (from the past 48 hour(s))  BASIC METABOLIC PANEL     Status: None   Collection Time    05/09/14  9:49 AM      Result Value Ref Range   Sodium 142  137 - 147 mEq/L   Potassium 3.8  3.7 - 5.3 mEq/L   Chloride 102  96 - 112 mEq/L   CO2 28  19 - 32 mEq/L   Glucose, Bld 96  70 - 99 mg/dL   BUN 11  6 - 23 mg/dL   Creatinine, Ser 0.67  0.50 - 1.10 mg/dL   Calcium 9.3  8.4 -  10.5 mg/dL   GFR calc non Af Amer >90  >90 mL/min   GFR calc Af Amer >90  >90 mL/min   Comment: (NOTE)     The eGFR has been calculated using the CKD EPI equation.     This calculation has not been validated in all clinical situations.     eGFR's persistently <90 mL/min signify possible Chronic Kidney     Disease.   Anion gap 12  5 - 15  CBC WITH DIFFERENTIAL     Status: None   Collection Time    05/09/14  9:49 AM      Result Value Ref Range   WBC 5.1  4.0 - 10.5 K/uL   RBC 4.48  3.87 - 5.11 MIL/uL   Hemoglobin 14.2  12.0 - 15.0 g/dL   HCT 39.9  36.0 - 46.0 %   MCV 89.1  78.0 - 100.0 fL   MCH 31.7  26.0 - 34.0 pg   MCHC 35.6  30.0 - 36.0 g/dL   RDW 12.3  11.5 - 15.5 %   Platelets 275  150 - 400   K/uL   Neutrophils Relative % 61  43 - 77 %   Neutro Abs 3.1  1.7 - 7.7 K/uL   Lymphocytes Relative 29  12 - 46 %   Lymphs Abs 1.5  0.7 - 4.0 K/uL   Monocytes Relative 8  3 - 12 %   Monocytes Absolute 0.4  0.1 - 1.0 K/uL   Eosinophils Relative 2  0 - 5 %   Eosinophils Absolute 0.1  0.0 - 0.7 K/uL   Basophils Relative 0  0 - 1 %   Basophils Absolute 0.0  0.0 - 0.1 K/uL   Dg Chest 2 View  05/09/2014   CLINICAL DATA:  Preoperative image, hypertension  EXAM: CHEST  2 VIEW  COMPARISON:  07/27/2006  FINDINGS: Stable thoracic spine Harrington rods. Stable mild sigmoid scoliotic curvature. Heart size and vascular pattern are normal. Lungs are clear.  IMPRESSION: No active cardiopulmonary disease.   Electronically Signed   By: Raymond  Rubner M.D.   On: 05/09/2014 11:34    Review of Systems  Constitutional: Negative.   HENT: Negative.   Eyes: Negative.   Respiratory: Negative.   Cardiovascular: Negative.   Gastrointestinal: Negative.   Genitourinary: Negative.   Musculoskeletal: Negative.   Skin: Negative.   Psychiatric/Behavioral: Negative.     There were no vitals taken for this visit. Physical Exam  Constitutional: She is oriented to person, place, and time. She appears  well-developed and well-nourished.  HENT:  Head: Normocephalic and atraumatic.  Eyes: Conjunctivae and EOM are normal. Pupils are equal, round, and reactive to light.  Cardiovascular: Normal rate.   Respiratory: Effort normal.  Musculoskeletal: Normal range of motion.  Neurological: She is alert and oriented to person, place, and time.  Skin: Skin is warm.  Psychiatric: She has a normal mood and affect. Her behavior is normal. Judgment and thought content normal.     Assessment/Plan Plan for immediate breast reconstruction with placement of tissue expander and Flex HD to right breast.  SANGER,Natasha Graham 05/11/2014, 8:21 AM    

## 2014-05-16 NOTE — Anesthesia Preprocedure Evaluation (Addendum)
Anesthesia Evaluation  Patient identified by MRN, date of birth, ID band Patient awake    Reviewed: Allergy & Precautions, H&P , NPO status , Patient's Chart, lab work & pertinent test results  Airway Mallampati: II      Dental  (+) Dental Advisory Given   Pulmonary neg pulmonary ROS,  breath sounds clear to auscultation        Cardiovascular hypertension, Rhythm:Regular Rate:Normal     Neuro/Psych    GI/Hepatic negative GI ROS, Neg liver ROS,   Endo/Other    Renal/GU negative Renal ROS     Musculoskeletal   Abdominal   Peds  Hematology   Anesthesia Other Findings   Reproductive/Obstetrics                        Anesthesia Physical Anesthesia Plan  ASA: II  Anesthesia Plan: General   Post-op Pain Management:    Induction: Intravenous  Airway Management Planned: Oral ETT  Additional Equipment:   Intra-op Plan:   Post-operative Plan: Extubation in OR  Informed Consent: I have reviewed the patients History and Physical, chart, labs and discussed the procedure including the risks, benefits and alternatives for the proposed anesthesia with the patient or authorized representative who has indicated his/her understanding and acceptance.   Dental advisory given  Plan Discussed with: CRNA, Anesthesiologist and Surgeon  Anesthesia Plan Comments:         Anesthesia Quick Evaluation

## 2014-05-16 NOTE — Interval H&P Note (Signed)
History and Physical Interval Note:  05/16/2014 9:44 AM  Natasha Graham  has presented today for surgery, with the diagnosis of right breast cancer  The various methods of treatment have been discussed with the patient and family. After consideration of risks, benefits and other options for treatment, the patient has consented to  Procedure(s): RIGHT NIPPLE SPARING MASTECTOMY WITH SENTINAL LYMPH NODE BIOPSY  (Right) IMMEDIATE RIGHT BREAST RECONSTRUCTION WITH PLACEMENT OF TISSUE EXPANDER AND FLEX HD (ACELLULAR HYDRATED DERMIS) (Right) as a surgical intervention .  The patient's history has been reviewed, patient examined, no change in status, stable for surgery.  I have reviewed the patient's chart and labs.  Questions were answered to the patient's satisfaction.     SANGER,Jolan Upchurch

## 2014-05-16 NOTE — Anesthesia Postprocedure Evaluation (Signed)
  Anesthesia Post-op Note  Patient: Natasha Graham  Procedure(s) Performed: Procedure(s): RIGHT NIPPLE SPARING MASTECTOMY WITH SENTINAL LYMPH NODE BIOPSY  (Right) IMMEDIATE RIGHT BREAST RECONSTRUCTION WITH PLACEMENT OF TISSUE EXPANDER AND FLEX HD (ACELLULAR HYDRATED DERMIS) (Right)  Patient Location: PACU  Anesthesia Type:General  Level of Consciousness: awake  Airway and Oxygen Therapy: Patient Spontanous Breathing  Post-op Pain: mild  Post-op Assessment: Post-op Vital signs reviewed  Post-op Vital Signs: Reviewed  Last Vitals:  Filed Vitals:   05/16/14 1147  BP:   Pulse:   Temp:   Resp: 12    Complications: No apparent anesthesia complications

## 2014-05-16 NOTE — Progress Notes (Signed)
Report given to Mark RN

## 2014-05-16 NOTE — H&P (Signed)
HPI  23 yof who has multiple mammograms that I reviewed. She has had followup for quite a while. She did have a stereotactic biopsy of right breast calcifications in November 2007 with a subsequent wire localization of some of these calcifications which showed atypical ductal hyperplasia. She was not evaluated for any risk reduction measures at that time. She underwent a mammogram last year that showed breast density C. This was read as a BI-RADS 3 mammogram with calcifications in the outer right breast that have remained stable. She was discussed an MRI at that point. She was then seen in March of 2015 with some calcifications in the lateral right breast changed. This underwent a biopsy that showed atypical ductal hyperplasia. She was also recommended an MRI again. The entire area of calcification measures 7.8 x 3.4 x 1.2 cm. There was a change in a portion of the calcifications and I did an excisional biopsy of this area that the pathology shows dcis with calcs measuring 0.7 cm. Margins are not involved but closest is the anterior at 1 mm. This is skin though. This is er positive at 100% and pr positive at 96%. A postop mm shows remaining amorphous calcifications within a segmental distribution anteriorly to the level of the nipple spanning 7 cm. MRI has also been performed a fluid filled excisional cavity. There are a couple separate areas of NME one of which appears to correlate with calcifications. We decided after Dr Nyoka Cowden of radiologys recommendation to attempt an mr biopsy that she thought correlated with calcs. . She has now undergone a stereo biopsy that shows dcis. She is doing well after that.  Past Medical History   Diagnosis  Date   .  Hypertension     Past Surgical History   Procedure  Laterality  Date   .  Brain surgery     .  Scoliosis   1972    Family History   Problem  Relation  Age of Onset   .  Cancer  Father      liver   Social History  History   Substance Use Topics   .   Smoking status:  Never Smoker   .  Smokeless tobacco:  Not on file   .  Alcohol Use:  No   No Known Allergies  Current Outpatient Prescriptions   Medication  Sig  Dispense  Refill   .  CINNAMON PO  Take by mouth.     .  lactobacillus acidophilus (BACID) TABS tablet  Take 2 tablets by mouth 3 (three) times daily.     Marland Kitchen  lisinopril-hydrochlorothiazide (PRINZIDE,ZESTORETIC) 20-25 MG per tablet      .  Multiple Vitamin (MULTIVITAMIN) tablet  Take 1 tablet by mouth daily.     .  Turmeric 500 MG CAPS  Take by mouth.      No current facility-administered medications for this visit.   Review of Systems  Review of Systems  Constitutional: Negative for fever, chills and unexpected weight change.  HENT: Negative for congestion, hearing loss, sore throat, trouble swallowing and voice change.  Eyes: Negative for visual disturbance.  Respiratory: Negative for cough and wheezing.  Cardiovascular: Negative for chest pain, palpitations and leg swelling.  Gastrointestinal: Negative for nausea, vomiting, abdominal pain, diarrhea, constipation, blood in stool, abdominal distention and anal bleeding.  Genitourinary: Negative for hematuria, vaginal bleeding and difficulty urinating.   Physical Exam  Physical Exam  Vitals reviewed.  Constitutional: She appears well-developed and well-nourished.  Eyes: No  scleral icterus.  Cardiovascular: Normal rate, regular rhythm and normal heart sounds.  Pulmonary/Chest: Effort normal and breath sounds normal. She has no wheezes. She has no rales. Right breast exhibits no inverted nipple, no mass, no nipple discharge, no skin change and no tenderness. Left breast exhibits no inverted nipple, no mass, no nipple discharge, no skin change and no tenderness. Healed right breast scar Lymphadenopathy:  She has no cervical adenopathy.  She has no axillary adenopathy.  Right: No supraclavicular adenopathy present.  Left: No supraclavicular adenopathy present.   Assessment   Right breast dcis Plan  Right NSM  There are now two separate areas in this larger area of calcifications that are dcis. I think she requires mastectomy for this and can do a nsm via inframammary incision.   I don't think she will require radiation as this is likely a large area of dcis. She does understand that other therapies are possible though and that invasive disease may be present. I will plan on sentinel node biopsy at the same time. She also understands that antiestrogen therapy will be indicated.  We discussed the staging and pathophysiology of breast cancer.  We discussed a sentinel lymph node biopsy as she does not appear to having lymph node involvement right now. We discussed the performance of that with injection of radioactive tracer. We discussed that she would have an incision underneath her axillary hairline. We discussed that there is a bout a 10-20% chance of having a positive node with a sentinel lymph node biopsy and we will await the permanent pathology to make any other first further decisions in terms of her treatment. One of these options might be to return to the operating room to perform an axillary lymph node dissection. We discussed about a 1-2% risk lifetime of chronic shoulder pain as well as lymphedema associated with a sentinel lymph node biopsy.  We discussed the risks of operation including bleeding, infection, possible reoperation. She understands her further therapy will be based on what her stages at the time of her operation.

## 2014-05-16 NOTE — Anesthesia Procedure Notes (Addendum)
Procedure Name: Intubation Date/Time: 05/16/2014 8:43 AM Performed by: Storm Frisk E Pre-anesthesia Checklist: Patient identified, Timeout performed, Emergency Drugs available, Suction available and Patient being monitored Patient Re-evaluated:Patient Re-evaluated prior to inductionOxygen Delivery Method: Circle system utilized Preoxygenation: Pre-oxygenation with 100% oxygen Intubation Type: IV induction and Cricoid Pressure applied Ventilation: Mask ventilation without difficulty Laryngoscope Size: Mac and 3 Grade View: Grade I Tube type: Oral Tube size: 7.0 mm Number of attempts: 1 Airway Equipment and Method: Stylet Placement Confirmation: ETT inserted through vocal cords under direct vision,  breath sounds checked- equal and bilateral and positive ETCO2 Secured at: 22 cm Tube secured with: Tape Dental Injury: Teeth and Oropharynx as per pre-operative assessment  Comments: B/L Breath Sounds confirmed by Dr. Oletta Lamas   Anesthesia Regional Block:  Pectoralis block  Pre-Anesthetic Checklist: ,, timeout performed, Correct Patient, Correct Site, Correct Laterality, Correct Procedure, Correct Position, site marked, Risks and benefits discussed,  Surgical consent,  Pre-op evaluation,  At surgeon's request and post-op pain management  Laterality: Right  Prep: chloraprep       Needles:   Needle Type: Stimulator Needle - 80          Additional Needles:  Procedures: Doppler guided and ultrasound guided (picture in chart) Pectoralis block Narrative:  Start time: 05/16/2014 8:00 AM End time: 05/16/2014 8:20 AM Injection made incrementally with aspirations every 5 mL. Anesthesiologist: Dr. Marcie Bal

## 2014-05-16 NOTE — Op Note (Signed)
Preoperative diagnosis: Right breast ductal carcinoma in situ Postoperative diagnosis: Same as above Procedure: Right nipple sparing mastectomy Right axillary sentinel lymph node biopsy Surgeon: Dr. Serita Grammes Anesthesia: Gen. With pectoral block Estimated blood loss: 40 cc Drains: Per plastic surgery Complications: None Specimens: #1 right nipple sparing mastectomy marked with paint #2 right retroareolar tissue marked with paint #3 right axillary tissue #4 right axillary sentinel node with counts of 2855 Social count was correct at completion Disposition case turned over to plastic surgery for reconstruction  Indications: This a 53 year old female who has a history of a larger calcifications in her upper outer quadrant that underwent an excisional biopsy number years ago for atypical ductal hyperplasia. It was elected that she would be followed at that point. She then came to see me for a change in these calcifications. She had an excisional biopsy showing ductal carcinoma in situ. We then obtained an MRI which showed a larger span of abnormality. She had a biopsy of the other extent of this disease it showed ductal carcinoma in situ. We decided then to proceed with a nipple sparing mastectomy, sentinel node biopsy, and immediate expander reconstruction with plastic surgery.  Procedure: After informed consent was obtained the patient first underwent a pectoral block and was injected with technetium in the standard perireolar fashion. She was given cefazolin. Sequential compression devices were in place and she was placed under general anesthesia without complication. Bilateral chest was then prepped and draped in the standard sterile surgical fashion. A surgical timeout was then performed.  I made an inframammary incision that measured 10 cm about 7 cm from the sternum. I then used cautery to remove the breast tissue including the pectoralis fascia from the pectoralis muscle. Anteriorly I  then used cautery and a combination of the facelift scissors to remove the breast from the skin. This was somewhat difficult in the region of her prior incision. I was able to remove the breast tissue in its entirety I then marked this with paint. This was passed off the table as a specimen. I then everted the nipple areolar complex. I then used the scissors to sharply remove all of the retroareolar tissue except for the skin and the nipple. This was painted and passed off the table as a separate specimen. I then used a neoprobe to identify a sentinel node with a count listed as above. There was no background radioactivity.This was excised. I then obtained hemostasis. The case was then turned over to plastic surgery for reconstruction.

## 2014-05-16 NOTE — Transfer of Care (Signed)
Immediate Anesthesia Transfer of Care Note  Patient: Natasha Graham  Procedure(s) Performed: Procedure(s): RIGHT NIPPLE SPARING MASTECTOMY WITH SENTINAL LYMPH NODE BIOPSY  (Right) IMMEDIATE RIGHT BREAST RECONSTRUCTION WITH PLACEMENT OF TISSUE EXPANDER AND FLEX HD (ACELLULAR HYDRATED DERMIS) (Right)  Patient Location: PACU  Anesthesia Type:General  Level of Consciousness: awake and alert   Airway & Oxygen Therapy: Patient Spontanous Breathing and Patient connected to nasal cannula oxygen  Post-op Assessment: Report given to PACU RN and Post -op Vital signs reviewed and stable  Post vital signs: Reviewed and stable  Complications: No apparent anesthesia complications

## 2014-05-16 NOTE — Progress Notes (Signed)
Nausea w/ movement/ no emesis/ orders rec'd for promethazine

## 2014-05-16 NOTE — Brief Op Note (Signed)
05/16/2014  9:45 AM  PATIENT:  Natasha Graham  53 y.o. female  PRE-OPERATIVE DIAGNOSIS:  right breast cancer  POST-OPERATIVE DIAGNOSIS:  right breast cancer  PROCEDURE:  Procedure(s): RIGHT NIPPLE SPARING MASTECTOMY WITH SENTINAL LYMPH NODE BIOPSY  (Right) IMMEDIATE RIGHT BREAST RECONSTRUCTION WITH PLACEMENT OF TISSUE EXPANDER AND FLEX HD (ACELLULAR HYDRATED DERMIS) (Right)  SURGEON:  Surgeon(s) and Role: Panel 1:    * Rolm Bookbinder, MD - Primary  Panel 2:    * Theodoro Kos, DO - Primary  PHYSICIAN ASSISTANT: Shawn Rayburn, PA  ASSISTANTS: none   ANESTHESIA:   general  EBL:     BLOOD ADMINISTERED:none  DRAINS: (1) Jackson-Pratt drain(s) with closed bulb suction in the breast pocket   LOCAL MEDICATIONS USED:  MARCAINE     SPECIMEN:  No Specimen  DISPOSITION OF SPECIMEN:  N/A  COUNTS:  YES  TOURNIQUET:  * No tourniquets in log *  DICTATION: .Dragon Dictation  PLAN OF CARE: Discharge to home after PACU  PATIENT DISPOSITION:  Observation   Delay start of Pharmacological VTE agent (>24hrs) due to surgical blood loss or risk of bleeding: no

## 2014-05-17 ENCOUNTER — Encounter (HOSPITAL_COMMUNITY): Payer: Self-pay | Admitting: General Surgery

## 2014-05-17 DIAGNOSIS — D0590 Unspecified type of carcinoma in situ of unspecified breast: Secondary | ICD-10-CM | POA: Diagnosis not present

## 2014-05-17 MED ADMIN — KCl 20 MEQ/L (0.15%) in Dextrose 5% & NaCl 0.45% Inj: INTRAVENOUS | @ 04:00:00

## 2014-05-17 NOTE — Discharge Instructions (Signed)
[facility] °[phone] ° °MASTECTOMY: POST OP INSTRUCTIONS ° °Always review your discharge instruction sheet given to you by the facility where your surgery was performed. °IF YOU HAVE DISABILITY OR FAMILY LEAVE FORMS, YOU MUST BRING THEM TO THE OFFICE FOR PROCESSING.   °DO NOT GIVE THEM TO YOUR DOCTOR. °A prescription for pain medication may be given to you upon discharge.  Take your pain medication as prescribed, if needed.  If narcotic pain medicine is not needed, then you may take acetaminophen (Tylenol), naprosyn (Alleve) or ibuprofen (Advil) as needed. °1. Take your usually prescribed medications unless otherwise directed. °2. If you need a refill on your pain medication, please contact your pharmacy.  They will contact our office to request authorization.  Prescriptions will not be filled after 5pm or on week-ends. °3. You should follow a light diet the first few days after arrival home, such as soup and crackers, etc.  Resume your normal diet the day after surgery. °4. Most patients will experience some swelling and bruising on the chest and underarm.  Ice packs will help.  Swelling and bruising can take several days to resolve. Wear the binder day and night until you return to the office.  °5. It is common to experience some constipation if taking pain medication after surgery.  Increasing fluid intake and taking a stool softener (such as Colace) will usually help or prevent this problem from occurring.  A mild laxative (Milk of Magnesia or Miralax) should be taken according to package instructions if there are no bowel movements after 48 hours. °6. Unless discharge instructions indicate otherwise, leave your bandage dry and in place until your next appointment in 3-5 days.  You may take a limited sponge bath.  No tube baths or showers until the drains are removed.  You may have steri-strips (small skin tapes) in place directly over the incision.  These strips should be left on the  skin for 7-10 days. If you have glue it will come off in next couple week.  Any sutures will be removed at an office visit °7. DRAINS:  If you have drains in place, it is important to keep a list of the amount of drainage produced each day in your drains.  Before leaving the hospital, you should be instructed on drain care.  Call our office if you have any questions about your drains. I will remove your drains when they put out less than 30 cc or ml for 2 consecutive days. °8. ACTIVITIES:  You may resume regular (light) daily activities beginning the next day--such as daily self-care, walking, climbing stairs--gradually increasing activities as tolerated.  You may have sexual intercourse when it is comfortable.  Refrain from any heavy lifting or straining until approved by your doctor. °a. You may drive when you are no longer taking prescription pain medication, you can comfortably wear a seatbelt, and you can safely maneuver your car and apply brakes. °b. RETURN TO WORK:  __________________________________________________________ °9. You should see your doctor in the office for a follow-up appointment approximately 3-5 days after your surgery.  Your doctor’s nurse will typically make your follow-up appointment when she calls you with your pathology report.  Expect your pathology report 3-4business days after surgery. °10. OTHER INSTRUCTIONS: ______________________________________________________________________________________________ ____________________________________________________________________________________________ °WHEN TO CALL YOUR DR Kayda Allers: °1. Fever over 101.0 °2. Nausea and/or vomiting °3. Extreme swelling or bruising °4. Continued bleeding from incision. °5. Increased pain, redness, or drainage from the incision. °The clinic staff is available   to answer your questions during regular business hours.  Please don’t hesitate to call and ask to speak to one of the nurses for clinical concerns.  If  you have a medical emergency, go to the nearest emergency room or call 911.  A surgeon from [facility] is always on call at the hospital. °[address], [address], [place], [place]  [address] ? [address] [address], [place], [place]   [address] °[phone] ? [phone] ? FAX [phone] °Web site: [email or web address] ° °

## 2014-05-17 NOTE — Progress Notes (Signed)
1 Day Post-Op  Subjective: Doing well this am. Pain under good control and nausea is improved.  Right breast flap and NAC are viable and without signs of hematoma or infection.  JP drain output as expected.  Ready for discharge today.  Objective: Vital signs in last 24 hours: Temp:  [97.5 F (36.4 C)-98.5 F (36.9 C)] 98 F (36.7 C) (09/17 0515) Pulse Rate:  [59-103] 59 (09/17 0515) Resp:  [8-20] 16 (09/17 0515) BP: (121-162)/(61-92) 121/61 mmHg (09/17 0515) SpO2:  [96 %-100 %] 98 % (09/17 0515) Weight:  [72.122 kg (159 lb)-74.39 kg (164 lb)] 72.122 kg (159 lb) (09/16 1315) Last BM Date: 05/16/14  Intake/Output from previous day: 09/16 0701 - 09/17 0700 In: 2640 [P.O.:440; I.V.:2200] Out: 1055 [Urine:600; Drains:205; Blood:250] Intake/Output this shift:    General appearance: alert, cooperative and no distress Resp: clear to auscultation bilaterally Cardio: regular rate and rhythm Right breast incision is clean, dry and intact and without signs of hematoma or infection. NAC/flap viable.   Lab Results:   Recent Labs  05/16/14 1430  WBC 11.8*  HGB 12.9  HCT 36.4  PLT 241   BMET  Recent Labs  05/16/14 1430  NA 136*  K 3.5*  CL 98  CO2 27  GLUCOSE 220*  BUN 9  CREATININE 0.63  CALCIUM 8.3*   PT/INR No results found for this basename: LABPROT, INR,  in the last 72 hours ABG No results found for this basename: PHART, PCO2, PO2, HCO3,  in the last 72 hours  Studies/Results: Nm Sentinel Node Inj-no Rpt (breast)  05/16/2014   CLINICAL DATA: right breast sentinel node biopsy   Sulfur colloid was injected intradermally by the nuclear medicine  technologist for breast cancer sentinel node localization.     Anti-infectives: Anti-infectives   Start     Dose/Rate Route Frequency Ordered Stop   05/16/14 1415  ceFAZolin (ANCEF) IVPB 1 g/50 mL premix     1 g 100 mL/hr over 30 Minutes Intravenous 3 times per day 05/16/14 1349     05/16/14 0831  polymyxin B 500,000  Units, bacitracin 50,000 Units in sodium chloride irrigation 0.9 % 500 mL irrigation  Status:  Discontinued       As needed 05/16/14 0916 05/16/14 1109   05/16/14 0600  ceFAZolin (ANCEF) IVPB 2 g/50 mL premix     2 g 100 mL/hr over 30 Minutes Intravenous On call to O.R. 05/15/14 1426 05/16/14 0837      Assessment/Plan: s/p Procedure(s): RIGHT NIPPLE SPARING MASTECTOMY WITH SENTINAL LYMPH NODE BIOPSY  (Right) IMMEDIATE RIGHT BREAST RECONSTRUCTION WITH PLACEMENT OF TISSUE EXPANDER AND FLEX HD (ACELLULAR HYDRATED DERMIS) (Right) Discharge today Follow up in office next Tuesday  LOS: 1 day    Tempest Frankland,PA-C Plastic Surgery [phone]

## 2014-05-23 NOTE — Progress Notes (Signed)
Agree with the above

## 2014-05-30 NOTE — Discharge Summary (Signed)
Agree with the above information.

## 2014-05-30 NOTE — Discharge Summary (Signed)
Physician Discharge Summary  Patient ID: Natasha Graham MRN: [id number] DOB/AGE: 54/27/1962 54 y.o.  Admit date: 05/16/2014 Discharge date: 05/17/14  Admission Diagnoses:Breast cancer, right breast  Discharge Diagnoses:  Active Problems:   Breast cancer, right breast   Discharged Condition: good  Hospital Course: The patient is a 53 yrs old female admitted for right nipple sparing mastectomy and sentinel node biopsy and for immediate right breast reconstruction. She was diagnosed with right breast cancer after an abnormal mammogram. There were changes in calcifications noted. An excisional biopsy was done with 1 mm margin. The tumor was ER and PR positive. There are still calcifications spanning 7 cm. An MRI was done. A stereotactic biopsy also showed DCIS. She had a family history in her paternal aunt with breast cancer in her 5s. She is 5 feet 3 inches tall, weighs 163 pounds and wears a 34 C bra. She is not a smoker. She is G3P3 and breast feed her children. She has scoliosis and had spinal rods placed. She has significant bruising in the right breast at present from the biopsy. No axillary nodes are palpated.   The patient was taken to the OR and underwent a right nipple sparing mastectomy and right axillary sentinel node biopsy followed by right immediate breast reconstruction with placement of ADM and tissue expander. She had no complications and was discharged the following day in stable condition.   Consults: None   Treatments: surgery: Procedure: Right nipple sparing mastectomy Right axillary sentinel lymph node biopsy Surgeon: Dr. Serita Grammes Followed by:  1. Right immediate breast reconstruction with placement of Acellular Dermal Matrix and tissue expanders. SURGEON: Claire Sanger, DO IMPLANTS: Right - [place]or 350 cc with 300 cc of saline placed. Acellular Dermal Matrix 4 x 16   Discharge Exam: Blood pressure 121/61, pulse 59, temperature 98 F (36.7 C), temperature  source Oral, resp. rate 16, height 5\' 4"  (1.626 m), weight 72.122 kg (159 lb), last menstrual period 10/16/2013, SpO2 98.00%. General appearance: alert, cooperative, appears stated age and no distress Resp: clear to auscultation bilaterally Cardio: regular rate and rhythm Right breast flap is viable and without signs of infection or hematoma  Disposition: 01-Home or Self Care     Medication List         Flax Seed Oil 1000 MG Caps  Take 1,000-2,000 mg by mouth daily.     lisinopril-hydrochlorothiazide 20-25 MG per tablet  Commonly known as:  PRINZIDE,ZESTORETIC  Take 1 tablet by mouth every evening.     multivitamin tablet  Take 1 tablet by mouth daily.           Follow-up Information   Follow up with [facility], DO In 1 week.   Specialty:  Plastic Surgery   Contact information:   [place] [address] [phone]       Follow up with Rolm Bookbinder, MD In 2 weeks.   Specialty:  General Surgery   Contact information:   [address] [place] [address] [phone]       Signed: [facility] [phone]

## 2014-06-04 ENCOUNTER — Telehealth: Payer: Self-pay | Admitting: *Deleted

## 2014-06-04 NOTE — Telephone Encounter (Signed)
Pt called back requesting to see Dr. Jana Hakim. Called Dr. Renella Cunas appt and confirmed 07/30/14 appt w/ pt.  Called Kim in Med Rec to request the records back so I can create the chart.

## 2014-06-04 NOTE — Telephone Encounter (Signed)
Received referral from [facility].  Called and confirmed 06/19/14 appt w/ pt.  Mailed calendar, welcoming packet & intake form to pt.  Emailed Engineer, civil (consulting) at [employer] to make her aware.  Took paperwork to HIM to create chart.

## 2014-06-11 ENCOUNTER — Ambulatory Visit: Payer: BC Managed Care – PPO | Admitting: Physical Therapy

## 2014-06-19 ENCOUNTER — Ambulatory Visit: Payer: BC Managed Care – PPO

## 2014-06-21 ENCOUNTER — Ambulatory Visit: Payer: BC Managed Care – PPO | Attending: General Surgery | Admitting: Physical Therapy

## 2014-06-21 DIAGNOSIS — C50919 Malignant neoplasm of unspecified site of unspecified female breast: Secondary | ICD-10-CM | POA: Diagnosis present

## 2014-07-19 ENCOUNTER — Encounter: Payer: BC Managed Care – PPO | Admitting: Physical Therapy

## 2014-07-27 ENCOUNTER — Encounter: Payer: Self-pay | Admitting: *Deleted

## 2014-07-27 NOTE — Progress Notes (Signed)
Completed chart and placed in Dr. Virgie Dad box.

## 2014-07-30 ENCOUNTER — Ambulatory Visit (HOSPITAL_BASED_OUTPATIENT_CLINIC_OR_DEPARTMENT_OTHER): Payer: BC Managed Care – PPO | Admitting: Oncology

## 2014-07-30 ENCOUNTER — Encounter: Payer: Self-pay | Admitting: Oncology

## 2014-07-30 ENCOUNTER — Other Ambulatory Visit (HOSPITAL_BASED_OUTPATIENT_CLINIC_OR_DEPARTMENT_OTHER): Payer: BC Managed Care – PPO

## 2014-07-30 ENCOUNTER — Other Ambulatory Visit: Payer: Self-pay | Admitting: *Deleted

## 2014-07-30 ENCOUNTER — Ambulatory Visit: Payer: BC Managed Care – PPO

## 2014-07-30 VITALS — BP 165/68 | HR 89 | Temp 98.2°F | Resp 18 | Ht 63.75 in | Wt 167.7 lb

## 2014-07-30 DIAGNOSIS — Z170 Estrogen receptor positive status [ER+]: Secondary | ICD-10-CM

## 2014-07-30 DIAGNOSIS — C50911 Malignant neoplasm of unspecified site of right female breast: Secondary | ICD-10-CM

## 2014-07-30 DIAGNOSIS — D0511 Intraductal carcinoma in situ of right breast: Secondary | ICD-10-CM

## 2014-07-30 LAB — CBC WITH DIFFERENTIAL/PLATELET
BASO%: 0.7 % (ref 0.0–2.0)
Basophils Absolute: 0 10*3/uL (ref 0.0–0.1)
EOS ABS: 0.1 10*3/uL (ref 0.0–0.5)
EOS%: 2.2 % (ref 0.0–7.0)
HCT: 41.6 % (ref 34.8–46.6)
HGB: 13.8 g/dL (ref 11.6–15.9)
LYMPH%: 30.1 % (ref 14.0–49.7)
MCH: 31 pg (ref 25.1–34.0)
MCHC: 33.2 g/dL (ref 31.5–36.0)
MCV: 93.4 fL (ref 79.5–101.0)
MONO#: 0.5 10*3/uL (ref 0.1–0.9)
MONO%: 9.2 % (ref 0.0–14.0)
NEUT%: 57.8 % (ref 38.4–76.8)
NEUTROS ABS: 3.4 10*3/uL (ref 1.5–6.5)
PLATELETS: 317 10*3/uL (ref 145–400)
RBC: 4.45 10*6/uL (ref 3.70–5.45)
RDW: 12.2 % (ref 11.2–14.5)
WBC: 6 10*3/uL (ref 3.9–10.3)
lymph#: 1.8 10*3/uL (ref 0.9–3.3)

## 2014-07-30 LAB — COMPREHENSIVE METABOLIC PANEL (CC13)
ALT: 23 U/L (ref 0–55)
ANION GAP: 12 meq/L — AB (ref 3–11)
AST: 23 U/L (ref 5–34)
Albumin: 4 g/dL (ref 3.5–5.0)
Alkaline Phosphatase: 78 U/L (ref 40–150)
BILIRUBIN TOTAL: 0.29 mg/dL (ref 0.20–1.20)
BUN: 10.5 mg/dL (ref 7.0–26.0)
CO2: 30 mEq/L — ABNORMAL HIGH (ref 22–29)
CREATININE: 0.9 mg/dL (ref 0.6–1.1)
Calcium: 9.5 mg/dL (ref 8.4–10.4)
Chloride: 102 mEq/L (ref 98–109)
GLUCOSE: 93 mg/dL (ref 70–140)
Potassium: 3.5 mEq/L (ref 3.5–5.1)
Sodium: 143 mEq/L (ref 136–145)
Total Protein: 7 g/dL (ref 6.4–8.3)

## 2014-07-30 NOTE — Progress Notes (Signed)
[facility]  [phone]) [phone] [phone]) [phone]     ID: Natasha Graham DOB: 10-Jul-1961  MR#: [id number]  BSW#:[id number]  Patient Care Team: Leonides Sake, MD as PCP - General (Family Medicine) Rolm Bookbinder, MD as Consulting Physician (General Surgery) Chauncey Cruel, MD as Consulting Physician (Oncology) OTHER MD: Sherrie Mustache MD  CHIEF COMPLAINT: Ductal carcinoma in situ  CURRENT TREATMENT: Observation   BREAST CANCER HISTORY: Natasha Graham has a history of right breast biopsy in late 2007 showing atypical ductal hyperplasia in the upper outer right breast.. Breast MRI 07/13/2006 showed postbiopsy changes in the upper right breast, and some right breast cysts. There were no solid masses however or areas of abnormal enhancement. There were no enlarged lymph nodes. Incidental finding of liver cysts was made. On 07/30/2006 she underwent needle localization of the calcifications in the outer right breast which also showed ([id number]) atypical ductal hyperplasia, with no malignancy.   On 10/21/2012 routine digital bilateral screening mammography at the breast Center showed breast density to be category C. In the right breast there were calcifications and architectural distortion to go with skin retraction. Additional views obtained 11/17/2012 showed extensive but stable punctate microcalcifications in the upper outer right breast. Many of these were superficial and not amenable to stereotactic biopsy. MRI was suggested but the patient was unable to undergo that test and therefore six-month mammography was obtained 05/15/2013. This showed the calcifications in the outer right breast to have been stable. Because of their extents and because of their superficial location they could not be all sampled all removed with biopsy. MRI was again discussed, but the patient has Harrington rods in place and also felt the cost of MRI was prohibitive.  On 11/13/2013 further six-month  follow-up showed a grouping of intermediate microcalcifications in the lateral right breast and these were biopsied 11/20/2013. The result of that procedure ([id number]) showed atypical ductal hyperplasia. Accordingly on 02/23/2014 the patient underwent right lumpectomy, with the pathology ([id number]) showing ductal carcinoma in situ, grade 2, measuring 0.7 cm. Estrogen receptor was 100% positive. Progesterone receptor was 96% positive. Both showed strong staining intensity. Margins were clear but the closest margin anteriorly was at 1 mm.  On 03/07/2014 the patient underwent postoperative mammography which showed post lumpectomy changes but also calcifications extending from the lumpectomy site anteriorly towards the nipple. On 03/12/2014 the patient underwent bilateral breast MRI. Breast composition here was described as category B. In the right breast there was a 4.8 cm fluidlike excisional cavity in the lower outer right breast. Adjacent to this was a focus of non-masslike enhancement measuring 1.9 cm. This is an area associated with the scar from the excision in 2007. There was also non-masslike enhancement inferior to the biopsy cavity measuring 3.8 cm. There were no abnormal lymph nodes and no masses or abnormal enhancement in the left breast. Specifically the left breast lower outer quadrant was unchanged from 2007 MRI.  Overall it was felt the linear non-masslike enhancement extending almost 4 cm inferior to the excisional cavity was sufficiently suspicious to warrant biopsy, and this was performed 03/29/2014. It again showed ductal carcinoma in situ, grade 1, estrogen receptor and progesterone receptor both 100% positive with strong staining intensity.  At this point the patient met with surgery and opted for definitive right mastectomy with immediate reconstruction. On 05/16/2014 she underwent simple mastectomy with sentinel lymph node sampling. The pathology ([id number]) showed atypical  ductal hyperplasia but no evidence of neoplasia. All 5 sentinel lymph  nodes were clear. The patient had immediate right breast reconstruction with tissue expander and dermal matrix placement.  She is scheduled for definitive implant placement 08/08/2014.  Her subsequent history is as detailed below  INTERVAL HISTORY: Natasha Graham  (who is niece to my patient Natasha Graham)  was evaluated in the rest clinic 07/30/2014.  She did well with her right mastectomy and is not having significant problems with her reconstruction. She is pleased so far with the cosmetic result.  REVIEW OF SYSTEMS:  She has had no periods for the past year. Still she denies significant problems with hot flashes (she states she has "hot feet" instead) , vaginal dryness, insomnia, or weight gain.   She exercises chiefly by walking, 30-45 minutes most days of the week.A detailed review of systems today was noncontributory  PAST MEDICAL HISTORY: Past Medical History  Diagnosis Date  . Hypertension   . Wears glasses   . Cancer     breast cancer  . Scoliosis     PAST SURGICAL HISTORY: Past Surgical History  Procedure Laterality Date  . Scoliosis  1972    harrington rods-age 53  . Tonsillectomy    . Dilation and curettage of uterus    . Breast surgery      right breast excisional biopsy  . Mastectomy Right 05/16/2014    placement of acellular dermal matrix & tissue expanders   . Breast reconstruction with placement of tissue expander and flex hd (acellular hydrated dermis) Right 05/16/2014    Procedure: IMMEDIATE RIGHT BREAST RECONSTRUCTION WITH PLACEMENT OF TISSUE EXPANDER AND FLEX HD (ACELLULAR HYDRATED DERMIS);  Surgeon: Theodoro Kos, DO;  Location: [facility];  Service: Plastics;  Laterality: Right;    FAMILY HISTORY Family History  Problem Relation Age of Onset  . Cancer Father     liver   there is significant cancer history on the paternal side, her father dying at the age of 22 with what the patient tells  me was metastatic head and neck cancer (but also involving the colon and liver). One of the patient's father's brothers had stomach cancer, and another had a brain tumor, and 2 of the patient's father's sisters had breast cancer, 1 diagnosed at age 14, the other at age 52.  On the mother's side there is a history of non-Hodgkin's lymphoma at age 29. There is no history of ovarian cancer in the family  GYNECOLOGIC HISTORY:  No LMP recorded. Patient is not currently having periods (Reason: Perimenopausal).  menarche age 53, first live birth age 22. The patient is GX P3. She stopped having periods in October 2014. She did not take hormone replacement. She did take birth control pills for approximately 1 year remotely, with no complications.  SOCIAL HISTORY:   The patient and her husband Derald Macleod (goes by [employer]") on a systems tacking and processing lumbar. She works as an Software engineer. Son Truman Hayward is a truck Geophysicist/field seismologist. Daughter Thayer Headings is a recent college graduate and is looking for a job. Roselyn Reef , 15 is a Ship broker. All live in [place]. The patient has 1 grandchild on the way. She attends [employer]    ADVANCED DIRECTIVES:  In place   HEALTH MAINTENANCE: History  Substance Use Topics  . Smoking status: Never Smoker   . Smokeless tobacco: Never Used  . Alcohol Use: No     Colonoscopy:  2013  PAP: March 2015  Bone density:  Lipid panel:  No Known Allergies  Current Outpatient Prescriptions  Medication  Sig Dispense Refill  . Flaxseed, Linseed, (FLAX SEED OIL) 1000 MG CAPS Take 1,000-2,000 mg by mouth daily.     Marland Kitchen lisinopril-hydrochlorothiazide (PRINZIDE,ZESTORETIC) 20-25 MG per tablet Take 1 tablet by mouth every evening.     . Multiple Vitamin (MULTIVITAMIN) tablet Take 1 tablet by mouth daily.     No current facility-administered medications for this visit.    OBJECTIVE:  Middle-aged white woman in no acute distress Filed Vitals:   07/30/14 1618  BP:  165/68  Pulse: 89  Temp: 98.2 F (36.8 C)  Resp: 18     Body mass index is 29.02 kg/(m^2).    ECOG FS:0 - Asymptomatic  Ocular: Sclerae unicteric, pupils equal, round and reactive to light Ear-nose-throat: Oropharynx clear,  Teeth in good repair Lymphatic: No cervical or supraclavicular adenopathy Lungs no rales or rhonchi, good excursion bilaterally Heart regular rate and rhythm, no murmur appreciated Abd soft, nontender, positive bowel sounds MSK no focal spinal tenderness, no joint edema Neuro: non-focal, well-oriented, appropriate affect Breasts:  The right breast is status post mastectomy  With expander in place. There is no evidence of local recurrence. The right axilla is benign. The left breast is unremarkable.   LAB RESULTS:  CMP     Component Value Date/Time   NA 143 07/30/2014 1603   NA 136* 05/16/2014 1430   K 3.5 07/30/2014 1603   K 3.5* 05/16/2014 1430   CL 98 05/16/2014 1430   CO2 30* 07/30/2014 1603   CO2 27 05/16/2014 1430   GLUCOSE 93 07/30/2014 1603   GLUCOSE 220* 05/16/2014 1430   BUN 10.5 07/30/2014 1603   BUN 9 05/16/2014 1430   CREATININE 0.9 07/30/2014 1603   CREATININE 0.63 05/16/2014 1430   CALCIUM 9.5 07/30/2014 1603   CALCIUM 8.3* 05/16/2014 1430   PROT 7.0 07/30/2014 1603   ALBUMIN 4.0 07/30/2014 1603   AST 23 07/30/2014 1603   ALT 23 07/30/2014 1603   ALKPHOS 78 07/30/2014 1603   BILITOT 0.29 07/30/2014 1603   GFRNONAA >90 05/16/2014 1430   GFRAA >90 05/16/2014 1430    INo results found for: SPEP, UPEP  Lab Results  Component Value Date   WBC 6.0 07/30/2014   NEUTROABS 3.4 07/30/2014   HGB 13.8 07/30/2014   HCT 41.6 07/30/2014   MCV 93.4 07/30/2014   PLT 317 07/30/2014      Chemistry      Component Value Date/Time   NA 143 07/30/2014 1603   NA 136* 05/16/2014 1430   K 3.5 07/30/2014 1603   K 3.5* 05/16/2014 1430   CL 98 05/16/2014 1430   CO2 30* 07/30/2014 1603   CO2 27 05/16/2014 1430   BUN 10.5 07/30/2014 1603    BUN 9 05/16/2014 1430   CREATININE 0.9 07/30/2014 1603   CREATININE 0.63 05/16/2014 1430      Component Value Date/Time   CALCIUM 9.5 07/30/2014 1603   CALCIUM 8.3* 05/16/2014 1430   ALKPHOS 78 07/30/2014 1603   AST 23 07/30/2014 1603   ALT 23 07/30/2014 1603   BILITOT 0.29 07/30/2014 1603       No results found for: LABCA2  No components found for: LABCA125  No results for input(s): INR in the last 168 hours.  Urinalysis No results found for: COLORURINE, APPEARANCEUR, LABSPEC, PHURINE, GLUCOSEU, HGBUR, BILIRUBINUR, KETONESUR, PROTEINUR, UROBILINOGEN, NITRITE, LEUKOCYTESUR  STUDIES: No results found.  Prior imaging studies reviewed with patient  ASSESSMENT: 53 y.o. [place], [place] woman  (1) status post right breast upper-outer  quadrant lumpectomy 07/30/2006 showing atypical ductal hyperplasia.  (2) right breast upper outer quadrant biopsy 11/20/2013 showed atypical ductal hyperplasia   (3) right lumpectomy 02/26/2014 showed ductal carcinoma in situ measuring 0.7 cm, estrogen receptor 100% positive, progesterone receptor 96% positive, with 1 mm margins  (4) right breast lower inner quadrant biopsy 03/29/2014 showed ductal carcinoma in situ, 100% estrogen receptor positive, 100% progesterone receptor positive,  (5) status post right mastectomy with sentinel lymph node sampling showing only atypical ductal hyperplasia, all 5 sentinel lymph nodes clear   (6) the patient opted against prophylactic antiestrogens  PLAN: We spent the better part of today's hour-long appointment discussing the biology of breast cancer in general, and the specifics of the patient's tumor in particular.  While there is a small amount of controversy regarding whether ductal carcinoma in situ is "truly a cancer " (since it is not life threatening) I think it is best to consider this is a noninvasive, non-life-threatening cancer, since we do not know which cases of ductal carcinoma in situ in fact will  become life threatening eventually. For that reason I affirmed the patient's decision to go ahead and have her right breast removed.  She understands that mastectomy cures noninvasive breast cancer and therefore as far as that cancer is concerned she does not need to do anything else beyond the reconstruction.  Because she still has a contralateral breast, there is a risk of breast cancer developing there. I would quote  That risk as a half percent per year, which means that she lives to be 46 she would have a 20% risk of developing breast cancer in the left breast. This could be noninvasive or invasive and it could be estrogen receptor positive or negative.   if she took anti-estrogens for 5 years that risk would drop in half. That means out of 100 women like her instead of 20 of them having cancer develop in the left breast, only 10 would. Again we are not talking life and death issues : Since she will have close monitoring, if she ever does develop cancer in the contralateral breast we should be able to pick it up early and cure, whether she would need radiation, chemotherapy, anti-estrogens, or only surgery would be a different question.  We then discussed the possible toxicities side effects and complications of anti-estrogens and I gave her that information in writing.    after much discussion Natasha Graham decided she feels very comfortable with no further treatment. I am comfortable with that decision as well.  I did recommend that she obtain "3-D" tomography mammography yearly, even if she has to pay a little bit extra to obtain that [employer] are already beginning to pay for that additional test)  Since it will increase the sensitivity of her left mammogram.  I also affirmed her exercise program which is excellent.   all she will need as far as breast cancer follow-up is concerned is yearly left mammography ( preferably with tomography as just discussed) and a yearly physician breast exam.  She will need no imaging on the right side.    accordingly we are releasing Debbie to her primary care physician. Of course I will be glad to see her at any point in the future if and when the need arises. As of now however we are not making any further routine appointments for her here.  Chauncey Cruel, MD   07/30/2014 5:28 PM     addendum: After Debbie left I checked eligibility  for genetics testing and given her family history she would qualify. I am sending her a letter letting her know that we will be glad to arrange that for her if she wishes to pursue that

## 2014-07-30 NOTE — Progress Notes (Signed)
CHECKED IN PATIENT WITH NO ISSUES PRIOR TO DR. Marlana Salvage HAS NOT BEEN TRAVELIGN AND HAS APPT CARD.

## 2014-08-02 ENCOUNTER — Other Ambulatory Visit: Payer: Self-pay | Admitting: Plastic Surgery

## 2014-08-02 DIAGNOSIS — N651 Disproportion of reconstructed breast: Secondary | ICD-10-CM

## 2014-08-02 DIAGNOSIS — Z9011 Acquired absence of right breast and nipple: Secondary | ICD-10-CM

## 2014-08-03 ENCOUNTER — Encounter (HOSPITAL_BASED_OUTPATIENT_CLINIC_OR_DEPARTMENT_OTHER): Payer: Self-pay | Admitting: *Deleted

## 2014-08-03 NOTE — Progress Notes (Signed)
No new labs needed 

## 2014-08-08 ENCOUNTER — Ambulatory Visit (HOSPITAL_BASED_OUTPATIENT_CLINIC_OR_DEPARTMENT_OTHER): Payer: BC Managed Care – PPO | Admitting: Anesthesiology

## 2014-08-08 ENCOUNTER — Encounter (HOSPITAL_BASED_OUTPATIENT_CLINIC_OR_DEPARTMENT_OTHER)
Admission: RE | Disposition: A | Discharge status: Home / Self Care | Payer: Self-pay | Source: Ambulatory Visit | Attending: Plastic Surgery

## 2014-08-08 ENCOUNTER — Ambulatory Visit (HOSPITAL_BASED_OUTPATIENT_CLINIC_OR_DEPARTMENT_OTHER)
Admission: RE | Admit: 2014-08-08 | Discharge: 2014-08-08 | Disposition: A | Discharge status: Home / Self Care | Payer: BC Managed Care – PPO | Source: Ambulatory Visit | Attending: Plastic Surgery | Admitting: Plastic Surgery

## 2014-08-08 ENCOUNTER — Other Ambulatory Visit: Payer: Self-pay | Admitting: Plastic Surgery

## 2014-08-08 ENCOUNTER — Encounter (HOSPITAL_BASED_OUTPATIENT_CLINIC_OR_DEPARTMENT_OTHER): Payer: Self-pay | Admitting: *Deleted

## 2014-08-08 DIAGNOSIS — N6489 Other specified disorders of breast: Secondary | ICD-10-CM | POA: Diagnosis present

## 2014-08-08 DIAGNOSIS — I10 Essential (primary) hypertension: Secondary | ICD-10-CM | POA: Diagnosis not present

## 2014-08-08 DIAGNOSIS — Z9011 Acquired absence of right breast and nipple: Secondary | ICD-10-CM | POA: Diagnosis not present

## 2014-08-08 DIAGNOSIS — Z853 Personal history of malignant neoplasm of breast: Secondary | ICD-10-CM | POA: Insufficient documentation

## 2014-08-08 DIAGNOSIS — Z170 Estrogen receptor positive status [ER+]: Secondary | ICD-10-CM | POA: Diagnosis not present

## 2014-08-08 DIAGNOSIS — N651 Disproportion of reconstructed breast: Secondary | ICD-10-CM

## 2014-08-08 DIAGNOSIS — Z803 Family history of malignant neoplasm of breast: Secondary | ICD-10-CM | POA: Diagnosis not present

## 2014-08-08 HISTORY — PX: MASTOPEXY: SHX5358

## 2014-08-08 HISTORY — PX: REMOVAL OF TISSUE EXPANDER AND PLACEMENT OF IMPLANT: SHX6457

## 2014-08-08 HISTORY — PX: LIPOSUCTION: SHX10

## 2014-08-08 LAB — POCT HEMOGLOBIN-HEMACUE: HEMOGLOBIN: 12.7 g/dL (ref 12.0–15.0)

## 2014-08-08 SURGERY — REMOVAL, TISSUE EXPANDER, BREAST, WITH IMPLANT INSERTION
Anesthesia: General | Site: Breast | Laterality: Right

## 2014-08-08 MED ORDER — OXYCODONE HCL 5 MG PO TABS
ORAL_TABLET | ORAL | Status: AC
  Filled 2014-08-08: qty 1

## 2014-08-08 MED ORDER — PROPOFOL 10 MG/ML IV BOLUS
INTRAVENOUS | Status: DC | PRN
  Administered 2014-08-08: 200 mg via INTRAVENOUS

## 2014-08-08 MED ORDER — SCOPOLAMINE 1 MG/3DAYS TD PT72
MEDICATED_PATCH | TRANSDERMAL | Status: AC
  Filled 2014-08-08: qty 1

## 2014-08-08 MED ORDER — LIDOCAINE HCL (PF) 1 % IJ SOLN
INTRAMUSCULAR | Status: AC
  Filled 2014-08-08: qty 60

## 2014-08-08 MED ORDER — CEFAZOLIN SODIUM-DEXTROSE 2-3 GM-% IV SOLR
2.0000 g | INTRAVENOUS | Status: AC
  Administered 2014-08-08: 2 g via INTRAVENOUS

## 2014-08-08 MED ORDER — MIDAZOLAM HCL 2 MG/2ML IJ SOLN: 1.0000 mg | INTRAMUSCULAR | Status: DC | PRN

## 2014-08-08 MED ORDER — SUFENTANIL CITRATE 50 MCG/ML IV SOLN
INTRAVENOUS | Status: DC | PRN
  Administered 2014-08-08 (×2): 10 ug via INTRAVENOUS

## 2014-08-08 MED ORDER — SCOPOLAMINE 1 MG/3DAYS TD PT72
1.0000 | MEDICATED_PATCH | TRANSDERMAL | Status: DC
Start: 2014-08-08 — End: 2014-08-08

## 2014-08-08 MED ORDER — MIDAZOLAM HCL 5 MG/5ML IJ SOLN
INTRAMUSCULAR | Status: DC | PRN
  Administered 2014-08-08: 2 mg via INTRAVENOUS

## 2014-08-08 MED ORDER — EPHEDRINE SULFATE 50 MG/ML IJ SOLN
INTRAMUSCULAR | Status: DC | PRN
  Administered 2014-08-08: 10 mg via INTRAVENOUS

## 2014-08-08 MED ORDER — OXYCODONE HCL 5 MG/5ML PO SOLN: 5.0000 mg | Freq: Once | ORAL | Status: AC | PRN

## 2014-08-08 MED ORDER — ONDANSETRON HCL 4 MG/2ML IJ SOLN: 4.0000 mg | Freq: Once | INTRAMUSCULAR | Status: DC | PRN

## 2014-08-08 MED ORDER — PROPOFOL 10 MG/ML IV EMUL
INTRAVENOUS | Status: AC
  Filled 2014-08-08: qty 50

## 2014-08-08 MED ORDER — BUPIVACAINE-EPINEPHRINE 0.25% -1:200000 IJ SOLN
INTRAMUSCULAR | Status: DC | PRN
  Administered 2014-08-08: 12.5 mL

## 2014-08-08 MED ORDER — SCOPOLAMINE 1 MG/3DAYS TD PT72
1.0000 | MEDICATED_PATCH | TRANSDERMAL | Status: DC
  Administered 2014-08-08: 1 via TRANSDERMAL
  Administered 2014-08-08: 1.5 mg via TRANSDERMAL

## 2014-08-08 MED ORDER — CEFAZOLIN SODIUM-DEXTROSE 2-3 GM-% IV SOLR
INTRAVENOUS | Status: AC
  Filled 2014-08-08: qty 50

## 2014-08-08 MED ORDER — PHENYLEPHRINE HCL 10 MG/ML IJ SOLN
INTRAMUSCULAR | Status: DC | PRN
  Administered 2014-08-08: 40 ug via INTRAVENOUS

## 2014-08-08 MED ORDER — BUPIVACAINE-EPINEPHRINE (PF) 0.25% -1:200000 IJ SOLN
INTRAMUSCULAR | Status: AC
  Filled 2014-08-08: qty 30

## 2014-08-08 MED ORDER — FENTANYL CITRATE 0.05 MG/ML IJ SOLN: 50.0000 ug | INTRAMUSCULAR | Status: DC | PRN

## 2014-08-08 MED ORDER — SUFENTANIL CITRATE 50 MCG/ML IV SOLN
INTRAVENOUS | Status: AC
  Filled 2014-08-08: qty 1

## 2014-08-08 MED ORDER — HYDROMORPHONE HCL 1 MG/ML IJ SOLN
0.2500 mg | INTRAMUSCULAR | Status: DC | PRN
  Administered 2014-08-08: 0.5 mg via INTRAVENOUS
  Administered 2014-08-08 (×2): 0.25 mg via INTRAVENOUS

## 2014-08-08 MED ORDER — LACTATED RINGERS IV SOLN
INTRAVENOUS | Status: DC
  Administered 2014-08-08 (×2): via INTRAVENOUS

## 2014-08-08 MED ORDER — SUCCINYLCHOLINE CHLORIDE 20 MG/ML IJ SOLN
INTRAMUSCULAR | Status: AC
  Filled 2014-08-08: qty 1

## 2014-08-08 MED ORDER — ONDANSETRON HCL 4 MG/2ML IJ SOLN
INTRAMUSCULAR | Status: DC | PRN
  Administered 2014-08-08: 4 mg via INTRAVENOUS

## 2014-08-08 MED ORDER — HYDROMORPHONE HCL 1 MG/ML IJ SOLN
INTRAMUSCULAR | Status: AC
  Filled 2014-08-08: qty 1

## 2014-08-08 MED ORDER — LIDOCAINE HCL (CARDIAC) 20 MG/ML IV SOLN
INTRAVENOUS | Status: DC | PRN
  Administered 2014-08-08: 50 mg via INTRAVENOUS

## 2014-08-08 MED ORDER — MIDAZOLAM HCL 2 MG/2ML IJ SOLN
INTRAMUSCULAR | Status: AC
  Filled 2014-08-08: qty 2

## 2014-08-08 MED ORDER — SODIUM CHLORIDE 0.9 % IV SOLN
INTRAVENOUS | Status: DC | PRN
  Administered 2014-08-08: 600 mL via INTRAMUSCULAR

## 2014-08-08 MED ORDER — DEXAMETHASONE SODIUM PHOSPHATE 4 MG/ML IJ SOLN
INTRAMUSCULAR | Status: DC | PRN
  Administered 2014-08-08: 10 mg via INTRAVENOUS

## 2014-08-08 MED ORDER — OXYCODONE HCL 5 MG PO TABS
5.0000 mg | ORAL_TABLET | Freq: Once | ORAL | Status: AC | PRN
  Administered 2014-08-08: 5 mg via ORAL

## 2014-08-08 MED ORDER — SODIUM CHLORIDE 0.9 % IR SOLN
Status: DC | PRN
  Administered 2014-08-08: 500 mL

## 2014-08-08 MED ORDER — SUCCINYLCHOLINE CHLORIDE 20 MG/ML IJ SOLN
INTRAMUSCULAR | Status: DC | PRN
  Administered 2014-08-08: 100 mg via INTRAVENOUS

## 2014-08-08 SURGICAL SUPPLY — 77 items
BAG DECANTER FOR FLEXI CONT (MISCELLANEOUS) ×6 IMPLANT
BINDER BREAST LRG (GAUZE/BANDAGES/DRESSINGS) IMPLANT
BINDER BREAST MEDIUM (GAUZE/BANDAGES/DRESSINGS) IMPLANT
BINDER BREAST XLRG (GAUZE/BANDAGES/DRESSINGS) IMPLANT
BINDER BREAST XXLRG (GAUZE/BANDAGES/DRESSINGS) IMPLANT
BIOPATCH RED 1 DISK 7.0 (GAUZE/BANDAGES/DRESSINGS) IMPLANT
BLADE HEX COATED 2.75 (ELECTRODE) ×6 IMPLANT
BLADE SURG 10 STRL SS (BLADE) ×12 IMPLANT
BLADE SURG 15 STRL LF DISP TIS (BLADE) ×10 IMPLANT
BLADE SURG 15 STRL SS (BLADE) ×2
BNDG GAUZE ELAST 4 BULKY (GAUZE/BANDAGES/DRESSINGS) ×12 IMPLANT
CANISTER LIPO FAT HARVEST (MISCELLANEOUS) ×6 IMPLANT
CANISTER SUCT 1200ML W/VALVE (MISCELLANEOUS) ×6 IMPLANT
CHLORAPREP W/TINT 26ML (MISCELLANEOUS) ×6 IMPLANT
CLSR STERI-STRIP ANTIMIC 1/2X4 (GAUZE/BANDAGES/DRESSINGS) ×12 IMPLANT
CORDS BIPOLAR (ELECTRODE) IMPLANT
COVER BACK TABLE 60X90IN (DRAPES) ×6 IMPLANT
COVER MAYO STAND STRL (DRAPES) ×6 IMPLANT
DECANTER SPIKE VIAL GLASS SM (MISCELLANEOUS) IMPLANT
DRAIN CHANNEL 19F RND (DRAIN) IMPLANT
DRAPE LAPAROSCOPIC ABDOMINAL (DRAPES) ×6 IMPLANT
DRSG PAD ABDOMINAL 8X10 ST (GAUZE/BANDAGES/DRESSINGS) ×12 IMPLANT
DRSG TEGADERM 2-3/8X2-3/4 SM (GAUZE/BANDAGES/DRESSINGS) IMPLANT
ELECT BLADE 4.0 EZ CLEAN MEGAD (MISCELLANEOUS) ×6
ELECT REM PT RETURN 9FT ADLT (ELECTROSURGICAL) ×6
ELECTRODE BLDE 4.0 EZ CLN MEGD (MISCELLANEOUS) ×5 IMPLANT
ELECTRODE REM PT RTRN 9FT ADLT (ELECTROSURGICAL) ×5 IMPLANT
EVACUATOR SILICONE 100CC (DRAIN) IMPLANT
FILTER LIPOSUCTION (MISCELLANEOUS) ×6 IMPLANT
GAUZE SPONGE 4X4 12PLY STRL (GAUZE/BANDAGES/DRESSINGS) IMPLANT
GLOVE BIO SURGEON STRL SZ 6.5 (GLOVE) ×24 IMPLANT
GLOVE SURG SS PI 7.0 STRL IVOR (GLOVE) ×6 IMPLANT
GOWN STRL REUS W/ TWL LRG LVL3 (GOWN DISPOSABLE) ×15 IMPLANT
GOWN STRL REUS W/TWL LRG LVL3 (GOWN DISPOSABLE) ×3
IMPL BREAST SALINE HP 500CC (Breast) ×5 IMPLANT
IMPLANT BREAST SALINE HP 500CC (Breast) ×6 IMPLANT
IV NS 1000ML (IV SOLUTION) ×1
IV NS 1000ML BAXH (IV SOLUTION) ×5 IMPLANT
IV NS 500ML (IV SOLUTION)
IV NS 500ML BAXH (IV SOLUTION) IMPLANT
KIT FILL SYSTEM UNIVERSAL (SET/KITS/TRAYS/PACK) IMPLANT
LINER CANISTER 1000CC FLEX (MISCELLANEOUS) ×6 IMPLANT
LIQUID BAND (GAUZE/BANDAGES/DRESSINGS) ×12 IMPLANT
NDL SAFETY ECLIPSE 18X1.5 (NEEDLE) ×5 IMPLANT
NEEDLE HYPO 18GX1.5 SHARP (NEEDLE) ×1
NEEDLE HYPO 25X1 1.5 SAFETY (NEEDLE) ×6 IMPLANT
NS IRRIG 1000ML POUR BTL (IV SOLUTION) IMPLANT
PACK BASIN DAY SURGERY FS (CUSTOM PROCEDURE TRAY) ×6 IMPLANT
PAD ALCOHOL SWAB (MISCELLANEOUS) ×6 IMPLANT
PENCIL BUTTON HOLSTER BLD 10FT (ELECTRODE) ×6 IMPLANT
PIN SAFETY STERILE (MISCELLANEOUS) IMPLANT
SIZER BREAST SGL 500CC (SIZER) ×5 IMPLANT
SIZER BREAST SGL USE 500CC (SIZER) ×1
SLEEVE SCD COMPRESS KNEE MED (MISCELLANEOUS) ×6 IMPLANT
SPONGE GAUZE 4X4 12PLY STER LF (GAUZE/BANDAGES/DRESSINGS) IMPLANT
SPONGE LAP 18X18 X RAY DECT (DISPOSABLE) ×12 IMPLANT
STRIP CLOSURE SKIN 1/2X4 (GAUZE/BANDAGES/DRESSINGS) ×6 IMPLANT
SUT MNCRL AB 4-0 PS2 18 (SUTURE) ×12 IMPLANT
SUT MON AB 3-0 SH 27 (SUTURE) ×2
SUT MON AB 3-0 SH27 (SUTURE) ×10 IMPLANT
SUT MON AB 5-0 PS2 18 (SUTURE) ×18 IMPLANT
SUT PDS AB 2-0 CT2 27 (SUTURE) IMPLANT
SUT SILK 3 0 PS 1 (SUTURE) IMPLANT
SUT VIC AB 3-0 SH 27 (SUTURE) ×1
SUT VIC AB 3-0 SH 27X BRD (SUTURE) ×5 IMPLANT
SUT VIC AB 5-0 PS2 18 (SUTURE) IMPLANT
SUT VICRYL 4-0 PS2 18IN ABS (SUTURE) ×6 IMPLANT
SYR 20CC LL (SYRINGE) IMPLANT
SYR 50ML LL SCALE MARK (SYRINGE) ×18 IMPLANT
SYR BULB IRRIGATION 50ML (SYRINGE) ×6 IMPLANT
SYR CONTROL 10ML LL (SYRINGE) ×6 IMPLANT
SYR TB 1ML LL NO SAFETY (SYRINGE) IMPLANT
TOWEL OR 17X24 6PK STRL BLUE (TOWEL DISPOSABLE) ×12 IMPLANT
TUBE CONNECTING 20X1/4 (TUBING) ×6 IMPLANT
TUBING SET GRADUATE ASPIR 12FT (MISCELLANEOUS) ×6 IMPLANT
UNDERPAD 30X30 INCONTINENT (UNDERPADS AND DIAPERS) ×12 IMPLANT
YANKAUER SUCT BULB TIP NO VENT (SUCTIONS) ×6 IMPLANT

## 2014-08-08 NOTE — Anesthesia Postprocedure Evaluation (Signed)
  Anesthesia Post-op Note  Patient: Natasha Graham  Procedure(s) Performed: Procedure(s): REMOVAL OF RIGHT  TISSUE EXPANDERS WITH PLACEMENT OF RIGHT BREAST IMPLANTS WITH LIPO SUCTION  (Right) LIPO SUCTION  (Bilateral)  MASTOPEXY FOR SYMMETRY (Left)  Patient Location: PACU  Anesthesia Type: General   Level of Consciousness: awake, alert  and oriented  Airway and Oxygen Therapy: Patient Spontanous Breathing  Post-op Pain: mild  Post-op Assessment: Post-op Vital signs reviewed  Post-op Vital Signs: Reviewed  Last Vitals:  Filed Vitals:   08/08/14 1145  BP: 120/74  Pulse: 68  Temp:   Resp: 8    Complications: No apparent anesthesia complications

## 2014-08-08 NOTE — Discharge Instructions (Signed)
May shower on Saturday Continue binder or sports bra  Post Anesthesia Home Care Instructions  Activity: Get plenty of rest for the remainder of the day. A responsible adult should stay with you for 24 hours following the procedure.  For the next 24 hours, DO NOT: -Drive a car -Paediatric nurse -Drink alcoholic beverages -Take any medication unless instructed by your physician -Make any legal decisions or sign important papers.  Meals: Start with liquid foods such as gelatin or soup. Progress to regular foods as tolerated. Avoid greasy, spicy, heavy foods. If nausea and/or vomiting occur, drink only clear liquids until the nausea and/or vomiting subsides. Call your physician if vomiting continues.  Special Instructions/Symptoms: Your throat may feel dry or sore from the anesthesia or the breathing tube placed in your throat during surgery. If this causes discomfort, gargle with warm salt water. The discomfort should disappear within 24 hours.

## 2014-08-08 NOTE — Anesthesia Preprocedure Evaluation (Signed)
Anesthesia Evaluation  Patient identified by MRN, date of birth, ID band Patient awake    Reviewed: Allergy & Precautions, H&P , NPO status , Patient's Chart, lab work & pertinent test results  Airway Mallampati: II  TM Distance: >3 FB Neck ROM: Full  Mouth opening: Limited Mouth Opening  Dental  (+) Teeth Intact, Dental Advisory Given   Pulmonary  breath sounds clear to auscultation        Cardiovascular hypertension, Pt. on medications Rhythm:Regular Rate:Normal     Neuro/Psych    GI/Hepatic   Endo/Other    Renal/GU      Musculoskeletal   Abdominal   Peds  Hematology   Anesthesia Other Findings   Reproductive/Obstetrics                             Anesthesia Physical Anesthesia Plan  ASA: II  Anesthesia Plan: General   Post-op Pain Management:    Induction: Intravenous  Airway Management Planned: Oral ETT  Additional Equipment:   Intra-op Plan:   Post-operative Plan: Extubation in OR  Informed Consent: I have reviewed the patients History and Physical, chart, labs and discussed the procedure including the risks, benefits and alternatives for the proposed anesthesia with the patient or authorized representative who has indicated his/her understanding and acceptance.   Dental advisory given  Plan Discussed with: CRNA and Anesthesiologist  Anesthesia Plan Comments:         Anesthesia Quick Evaluation

## 2014-08-08 NOTE — Brief Op Note (Signed)
08/08/2014  10:34 AM  PATIENT:  Natasha Graham  53 y.o. female  PRE-OPERATIVE DIAGNOSIS:  HISTORY OF RIGHT BREAST CANCER,HYPERTROPHY OF LEFT BREAST  POST-OPERATIVE DIAGNOSIS:  HISTORY OF RIGHT BREAST CANCER,HYPERTROPHY OF LEFT BREAST  PROCEDURE:  Procedure(s): REMOVAL OF RIGHT  TISSUE EXPANDERS WITH PLACEMENT OF RIGHT BREAST IMPLANTS WITH LIPO SUCTION  (Right) LIPO SUCTION  (Bilateral)  MASTOPEXY FOR SYMMETRY (Left)  SURGEON:  Surgeon(s) and Role:    * Claire Sanger, DO - Primary  PHYSICIAN ASSISTANT: Shawn Rayburn, PA  ASSISTANTS: none   ANESTHESIA:   general  EBL:  Total I/O In: 1200 [I.V.:1200] Out: -   BLOOD ADMINISTERED:none  DRAINS: none   LOCAL MEDICATIONS USED:  MARCAINE     SPECIMEN:  Source of Specimen:  mastectomy scars and left breast tissue  DISPOSITION OF SPECIMEN:  PATHOLOGY  COUNTS:  YES  TOURNIQUET:  * No tourniquets in log *  DICTATION: .Dragon Dictation  PLAN OF CARE: Discharge to home after PACU  PATIENT DISPOSITION:  PACU - hemodynamically stable.   Delay start of Pharmacological VTE agent (>24hrs) due to surgical blood loss or risk of bleeding: no

## 2014-08-08 NOTE — Anesthesia Procedure Notes (Signed)
Procedure Name: Intubation Date/Time: 08/08/2014 8:44 AM Performed by: Melynda Ripple D Pre-anesthesia Checklist: Patient identified, Emergency Drugs available, Suction available and Patient being monitored Patient Re-evaluated:Patient Re-evaluated prior to inductionOxygen Delivery Method: Circle System Utilized Preoxygenation: Pre-oxygenation with 100% oxygen Intubation Type: IV induction Ventilation: Mask ventilation without difficulty Laryngoscope Size: Mac and 3 Grade View: Grade II Tube type: Oral Number of attempts: 1 Airway Equipment and Method: stylet and oral airway Placement Confirmation: ETT inserted through vocal cords under direct vision,  positive ETCO2 and breath sounds checked- equal and bilateral Secured at: 22 cm Tube secured with: Tape Dental Injury: Teeth and Oropharynx as per pre-operative assessment

## 2014-08-08 NOTE — Op Note (Signed)
Op report    DATE OF OPERATION:  08/08/2014  LOCATION: [facility]  SURGICAL DIVISION: Plastic Surgery  PREOPERATIVE DIAGNOSES:  1. History of breast cancer.  2. Acquired absence of right breast.  3. Left Breast asymmetry  POSTOPERATIVE DIAGNOSES:  1. History of breast cancer.  2. Acquired absence of right breast.  3. Left Breast asymmetry  PROCEDURE:  1. Right exchange of tissue expander for implant.  2. Right breastcapsulotomies for implant respositioning. 3. Left breast mastopexy for symmetry. 4. Bilateral liposuction for symmetry.  SURGEON: Theodoro Kos, DO  ASSISTANT: Shawn Rayburn, PA  ANESTHESIA:  General.   COMPLICATIONS: None.   IMPLANTS: Mentor Smooth Round High Profile saline 500cc. Ref #[id number].  Serial Number [id number] filled to 600cc  INDICATIONS FOR PROCEDURE:  The patient, Natasha Graham, is a 53 y.o. female born on 06-Apr-1961, is here for treatment for further treatment after a mastectomy and placement of a tissue expander. She now presents for exchange of her expander for an implant.  She requires capsulotomies to better position the implant. MRN: [id number]  CONSENT:  Informed consent was obtained directly from the patient. Risks, benefits and alternatives were fully discussed. Specific risks including but not limited to bleeding, infection, hematoma, seroma, scarring, pain, implant infection, implant extrusion, capsular contracture, asymmetry, wound healing problems, and need for further surgery were all discussed. The patient did have an ample opportunity to have her questions answered to her satisfaction.   DESCRIPTION OF PROCEDURE:  The patient was taken to the operating room. SCDs were placed and IV antibiotics were given. The patient's chest was prepped and draped in a sterile fashion. A time out was performed and the implants to be used were identified.  Local was used to infiltrate the area.   The old mastectomy scar  was opened and superior mastectomy and inferior mastectomy flaps were re-raised over the pectoralis major muscle. The pectoralis was split to expose the tissue expander which was removed. Inspection of the pocket showed a normal healthy capsule and good integration of the biologic matrix.   Circumferential capsulotomies were performed to allow for breast pocket expansion.  Measurements were made to confirm adequate pocket size for the implant dimensions.  Hemostasis was ensured.  Gloves were changed. The implant was placed in the pocket and oriented appropriately.  It was filled with 600 cc of normal injectable saline. The pectoralis major muscle and capsule on the anterior surface were re-closed with a 3-0 running Vicryl suture. The remaining skin was closed with 4-0 Monocryl deep dermal and 5-0 Monocryl subcuticular stitches. Liposuction was done on the inferior and lateral aspect for symmetry and improved contour.  Attention was turned to the left breast and a mastopexy was performed with the ice cream cone shape.  She was marked preoperatively.  The markings were confirmed and the #10 blade was used to de-epithelialize the location for the NAC and at the vertical limb.  The flaps were raised and the NAC inset with 4-0 Monocryl followed by 5-0 Monocryl.  The vertical limb was closed with 3-0 Monocryl follwed by 4-0 and 5-0 Monocryl.  Dermabond and steri strips were applied.  A breast binder and ABD was applied.  The patient was allowed to wake from anesthesia and taken to the recovery room in satisfactory condition.

## 2014-08-08 NOTE — Transfer of Care (Signed)
Immediate Anesthesia Transfer of Care Note  Patient: Natasha Graham  Procedure(s) Performed: Procedure(s): REMOVAL OF RIGHT  TISSUE EXPANDERS WITH PLACEMENT OF RIGHT BREAST IMPLANTS WITH LIPO SUCTION  (Right) LIPO SUCTION  (Bilateral)  MASTOPEXY FOR SYMMETRY (Left)  Patient Location: PACU  Anesthesia Type:General  Level of Consciousness: awake, alert , oriented and patient cooperative  Airway & Oxygen Therapy: Patient Spontanous Breathing and Patient connected to face mask oxygen  Post-op Assessment: Report given to PACU RN and Post -op Vital signs reviewed and stable  Post vital signs: Reviewed and stable  Complications: No apparent anesthesia complications

## 2014-08-08 NOTE — H&P (Signed)
Natasha Graham is an 53 y.o. female.   Chief Complaint: acquired absence of right breast HPI: The patient is a 53 yrs old female here for a post operative follow up after breast reconstruction with expander / FlexHD followed by implant placement. She was diagnosed with right breast cancer after an abnormal mammogram. There were changes in calcifications noted. An excisional biopsy was done with 1 mm margin. The tumor was ER and PR positive. There are still calcifications spanning 7 cm. An MRI was done. A stereotactic biopsy also showed DCIS. She had a family history in her paternal aunt with breast cancer in her 37s. She is 5 feet 3 inches tall, weighs 163 pounds and wears a 34 C bra. She is not a smoker. She is G3P3 and breast feed her children. She has scoliosis and had spinal rods placed. No sign of infection. She has 580/350 cc in the right expander. The right NAC looks healthy.  Past Medical History  Diagnosis Date  . Hypertension   . Wears glasses   . Cancer     breast cancer  . Scoliosis     Past Surgical History  Procedure Laterality Date  . Scoliosis  1972    harrington rods-age 57  . Tonsillectomy    . Dilation and curettage of uterus    . Breast surgery      right breast excisional biopsy  . Mastectomy Right 05/16/2014    placement of acellular dermal matrix & tissue expanders   . Breast reconstruction with placement of tissue expander and flex hd (acellular hydrated dermis) Right 05/16/2014    Procedure: IMMEDIATE RIGHT BREAST RECONSTRUCTION WITH PLACEMENT OF TISSUE EXPANDER AND FLEX HD (ACELLULAR HYDRATED DERMIS);  Surgeon: Theodoro Kos, DO;  Location: [facility];  Service: Plastics;  Laterality: Right;    Family History  Problem Relation Age of Onset  . Cancer Father     liver   Social History:  reports that she has never smoked. She has never used smokeless tobacco. She reports that she does not drink alcohol or use illicit drugs.  Allergies: No Known  Allergies  Medications Prior to Admission  Medication Sig Dispense Refill  . cephALEXin (KEFLEX) 500 MG capsule Take 500 mg by mouth.    . diazepam (VALIUM) 2 MG tablet Take 2 mg by mouth.    . Flaxseed, Linseed, (FLAX SEED OIL) 1000 MG CAPS Take 1,000-2,000 mg by mouth daily.     Marland Kitchen HYDROcodone-acetaminophen (NORCO) 5-325 MG per tablet Take 1 tablet by mouth.    Marland Kitchen lisinopril-hydrochlorothiazide (PRINZIDE,ZESTORETIC) 20-25 MG per tablet Take 1 tablet by mouth every evening.     . Multiple Vitamin (MULTIVITAMIN) tablet Take 1 tablet by mouth daily.      No results found for this or any previous visit (from the past 48 hour(s)). No results found.  Review of Systems  Constitutional: Negative.   HENT: Negative.   Eyes: Negative.   Respiratory: Negative.   Cardiovascular: Negative.   Gastrointestinal: Negative.   Genitourinary: Negative.   Musculoskeletal: Negative.   Skin: Negative.   Neurological: Negative.   Psychiatric/Behavioral: Negative.     Blood pressure 151/80, pulse 85, temperature 97.6 F (36.4 C), temperature source Oral, resp. rate 18, height 5\' 4"  (1.626 m), weight 76.204 kg (168 lb), SpO2 99 %. Physical Exam  Constitutional: She is oriented to person, place, and time. She appears well-developed and well-nourished.  HENT:  Head: Normocephalic and atraumatic.  Eyes: Conjunctivae and EOM are normal. Pupils are equal,  round, and reactive to light.  Cardiovascular: Normal rate.   Respiratory: Effort normal.  GI: Soft.  Musculoskeletal: Normal range of motion.  Neurological: She is alert and oriented to person, place, and time.  Skin: Skin is warm.  Psychiatric: She has a normal mood and affect. Her behavior is normal. Judgment and thought content normal.     Assessment/Plan We placed injectable saline in the Right: 50 cc for a total of 630/350 cc. It was done under sterile conditions and the patient tolerated it well. We will plan for a left mastopexy for symmetry  and right expander removal with implant placement. We had a long discussion about silicone vs saline and she would like a saline implant if it fits otherwise she is agreeable to silicone.   SANGER,Raigen Jagielski 08/08/2014, 8:27 AM

## 2014-08-09 ENCOUNTER — Encounter (HOSPITAL_BASED_OUTPATIENT_CLINIC_OR_DEPARTMENT_OTHER): Payer: Self-pay | Admitting: Plastic Surgery

## 2014-08-09 NOTE — Addendum Note (Signed)
Addendum  created 08/09/14 1149 by Tawni Millers, CRNA   Modules edited: Charges VN

## 2014-10-24 ENCOUNTER — Other Ambulatory Visit: Payer: Self-pay

## 2014-10-24 DIAGNOSIS — Z1231 Encounter for screening mammogram for malignant neoplasm of breast: Secondary | ICD-10-CM

## 2014-11-15 ENCOUNTER — Ambulatory Visit
Admission: RE | Admit: 2014-11-15 | Discharge: 2014-11-15 | Disposition: A | Discharge status: Home / Self Care | Payer: BLUE CROSS/BLUE SHIELD | Source: Ambulatory Visit

## 2014-11-15 DIAGNOSIS — Z1231 Encounter for screening mammogram for malignant neoplasm of breast: Secondary | ICD-10-CM

## 2015-09-06 ENCOUNTER — Other Ambulatory Visit: Payer: Self-pay | Admitting: Obstetrics and Gynecology

## 2015-09-20 ENCOUNTER — Encounter (HOSPITAL_COMMUNITY)
Admission: RE | Disposition: A | Discharge status: Home / Self Care | Payer: Self-pay | Source: Ambulatory Visit | Attending: Obstetrics and Gynecology

## 2015-09-20 ENCOUNTER — Ambulatory Visit (HOSPITAL_COMMUNITY)
Admission: RE | Admit: 2015-09-20 | Discharge: 2015-09-20 | Disposition: A | Discharge status: Home / Self Care | Payer: BLUE CROSS/BLUE SHIELD | Source: Ambulatory Visit | Attending: Obstetrics and Gynecology | Admitting: Obstetrics and Gynecology

## 2015-09-20 ENCOUNTER — Ambulatory Visit (HOSPITAL_COMMUNITY): Payer: BLUE CROSS/BLUE SHIELD | Admitting: Anesthesiology

## 2015-09-20 ENCOUNTER — Encounter (HOSPITAL_COMMUNITY): Payer: Self-pay

## 2015-09-20 DIAGNOSIS — I10 Essential (primary) hypertension: Secondary | ICD-10-CM | POA: Insufficient documentation

## 2015-09-20 DIAGNOSIS — M419 Scoliosis, unspecified: Secondary | ICD-10-CM | POA: Diagnosis not present

## 2015-09-20 DIAGNOSIS — Z853 Personal history of malignant neoplasm of breast: Secondary | ICD-10-CM | POA: Diagnosis not present

## 2015-09-20 DIAGNOSIS — N950 Postmenopausal bleeding: Secondary | ICD-10-CM | POA: Insufficient documentation

## 2015-09-20 DIAGNOSIS — Z79899 Other long term (current) drug therapy: Secondary | ICD-10-CM | POA: Insufficient documentation

## 2015-09-20 DIAGNOSIS — Z9011 Acquired absence of right breast and nipple: Secondary | ICD-10-CM | POA: Diagnosis not present

## 2015-09-20 HISTORY — DX: Nausea with vomiting, unspecified: R11.2

## 2015-09-20 HISTORY — DX: Other specified postprocedural states: Z98.890

## 2015-09-20 HISTORY — PX: HYSTEROSCOPY WITH D & C: SHX1775

## 2015-09-20 SURGERY — DILATATION AND CURETTAGE /HYSTEROSCOPY
Anesthesia: Choice

## 2015-09-20 MED ORDER — CEFAZOLIN SODIUM-DEXTROSE 2-3 GM-% IV SOLR
2.0000 g | INTRAVENOUS | Status: AC
  Administered 2015-09-20: 2 g via INTRAVENOUS

## 2015-09-20 MED ORDER — MIDAZOLAM HCL 2 MG/2ML IJ SOLN
INTRAMUSCULAR | Status: DC | PRN
  Administered 2015-09-20: 2 mg via INTRAVENOUS

## 2015-09-20 MED ORDER — MIDAZOLAM HCL 2 MG/2ML IJ SOLN
INTRAMUSCULAR | Status: AC
  Filled 2015-09-20: qty 2

## 2015-09-20 MED ORDER — KETOROLAC TROMETHAMINE 30 MG/ML IJ SOLN
INTRAMUSCULAR | Status: AC
  Filled 2015-09-20: qty 1

## 2015-09-20 MED ORDER — SCOPOLAMINE 1 MG/3DAYS TD PT72
1.0000 | MEDICATED_PATCH | Freq: Once | TRANSDERMAL | Status: DC
  Administered 2015-09-20: 1.5 mg via TRANSDERMAL

## 2015-09-20 MED ORDER — PROPOFOL 10 MG/ML IV BOLUS
INTRAVENOUS | Status: DC | PRN
  Administered 2015-09-20: 200 mg via INTRAVENOUS

## 2015-09-20 MED ORDER — FENTANYL CITRATE (PF) 100 MCG/2ML IJ SOLN: 25.0000 ug | INTRAMUSCULAR | Status: DC | PRN

## 2015-09-20 MED ORDER — PROPOFOL 10 MG/ML IV BOLUS
INTRAVENOUS | Status: AC
  Filled 2015-09-20: qty 20

## 2015-09-20 MED ORDER — FENTANYL CITRATE (PF) 250 MCG/5ML IJ SOLN
INTRAMUSCULAR | Status: AC
  Filled 2015-09-20: qty 5

## 2015-09-20 MED ORDER — BUPIVACAINE HCL (PF) 0.25 % IJ SOLN
INTRAMUSCULAR | Status: AC
  Filled 2015-09-20: qty 30

## 2015-09-20 MED ORDER — LIDOCAINE HCL (CARDIAC) 20 MG/ML IV SOLN
INTRAVENOUS | Status: DC | PRN
  Administered 2015-09-20: 80 mg via INTRAVENOUS

## 2015-09-20 MED ORDER — HYDROCODONE-IBUPROFEN 7.5-200 MG PO TABS: 1.0000 | ORAL_TABLET | Freq: Three times a day (TID) | ORAL | Status: DC | PRN

## 2015-09-20 MED ORDER — ONDANSETRON HCL 4 MG/2ML IJ SOLN
INTRAMUSCULAR | Status: AC
  Filled 2015-09-20: qty 2

## 2015-09-20 MED ORDER — LIDOCAINE HCL (CARDIAC) 20 MG/ML IV SOLN
INTRAVENOUS | Status: AC
  Filled 2015-09-20: qty 5

## 2015-09-20 MED ORDER — GLYCINE 1.5 % IR SOLN
Status: DC | PRN
  Administered 2015-09-20: 3000 mL

## 2015-09-20 MED ORDER — DEXAMETHASONE SODIUM PHOSPHATE 10 MG/ML IJ SOLN
INTRAMUSCULAR | Status: DC | PRN
  Administered 2015-09-20: 8 mg via INTRAVENOUS

## 2015-09-20 MED ORDER — DEXAMETHASONE SODIUM PHOSPHATE 10 MG/ML IJ SOLN
INTRAMUSCULAR | Status: AC
  Filled 2015-09-20: qty 1

## 2015-09-20 MED ORDER — CEFAZOLIN SODIUM-DEXTROSE 2-3 GM-% IV SOLR
INTRAVENOUS | Status: AC
  Filled 2015-09-20: qty 50

## 2015-09-20 MED ORDER — BUPIVACAINE HCL (PF) 0.25 % IJ SOLN
INTRAMUSCULAR | Status: DC | PRN
  Administered 2015-09-20: 20 mL

## 2015-09-20 MED ORDER — ONDANSETRON HCL 4 MG/2ML IJ SOLN
INTRAMUSCULAR | Status: DC | PRN
  Administered 2015-09-20: 4 mg via INTRAVENOUS

## 2015-09-20 MED ORDER — LACTATED RINGERS IV SOLN
INTRAVENOUS | Status: DC
  Administered 2015-09-20 (×2): via INTRAVENOUS

## 2015-09-20 MED ORDER — KETOROLAC TROMETHAMINE 30 MG/ML IJ SOLN
INTRAMUSCULAR | Status: DC | PRN
  Administered 2015-09-20: 30 mg via INTRAVENOUS

## 2015-09-20 MED ORDER — SCOPOLAMINE 1 MG/3DAYS TD PT72
MEDICATED_PATCH | TRANSDERMAL | Status: AC
  Administered 2015-09-20: 1.5 mg via TRANSDERMAL
  Filled 2015-09-20: qty 1

## 2015-09-20 MED ORDER — FENTANYL CITRATE (PF) 100 MCG/2ML IJ SOLN
INTRAMUSCULAR | Status: DC | PRN
  Administered 2015-09-20 (×2): 50 ug via INTRAVENOUS

## 2015-09-20 SURGICAL SUPPLY — 15 items
CANISTER SUCT 3000ML (MISCELLANEOUS) ×2 IMPLANT
CATH ROBINSON RED A/P 16FR (CATHETERS) ×2 IMPLANT
CLOTH BEACON ORANGE TIMEOUT ST (SAFETY) ×2 IMPLANT
CONTAINER PREFILL 10% NBF 60ML (FORM) ×2 IMPLANT
GLOVE BIO SURGEON STRL SZ7.5 (GLOVE) ×2 IMPLANT
GLOVE BIOGEL PI IND STRL 7.0 (GLOVE) ×1 IMPLANT
GLOVE BIOGEL PI INDICATOR 7.0 (GLOVE) ×1
GOWN STRL REUS W/TWL LRG LVL3 (GOWN DISPOSABLE) ×6 IMPLANT
PACK VAGINAL MINOR WOMEN LF (CUSTOM PROCEDURE TRAY) ×2 IMPLANT
PAD OB MATERNITY 4.3X12.25 (PERSONAL CARE ITEMS) ×2 IMPLANT
SYR TB 1ML 25GX5/8 (SYRINGE) ×2 IMPLANT
TOWEL OR 17X24 6PK STRL BLUE (TOWEL DISPOSABLE) ×4 IMPLANT
TUBING AQUILEX INFLOW (TUBING) ×2 IMPLANT
TUBING AQUILEX OUTFLOW (TUBING) ×2 IMPLANT
WATER STERILE IRR 1000ML POUR (IV SOLUTION) ×2 IMPLANT

## 2015-09-20 NOTE — Transfer of Care (Signed)
Immediate Anesthesia Transfer of Care Note  Patient: Natasha Graham  Procedure(s) Performed: Procedure(s): DILATATION AND CURETTAGE /HYSTEROSCOPY (N/A)  Patient Location: PACU  Anesthesia Type:General  Level of Consciousness: awake and patient cooperative  Airway & Oxygen Therapy: Patient Spontanous Breathing and Patient connected to nasal cannula oxygen  Post-op Assessment: Report given to RN and Post -op Vital signs reviewed and stable  Post vital signs: Reviewed and stable  Last Vitals:  Filed Vitals:   09/20/15 0739  BP: 152/80  Pulse: 72  Temp: 36.5 C  Resp: 18    Complications: No apparent anesthesia complications

## 2015-09-20 NOTE — Progress Notes (Signed)
Patient ID: Natasha Graham, female   DOB: [birth year], 55 y.o.   MRN: [id number] Patient seen and examined. Consent witnessed and signed. No changes noted. Update completed.

## 2015-09-20 NOTE — Anesthesia Preprocedure Evaluation (Signed)
Anesthesia Evaluation  Patient identified by MRN, date of birth, ID band Patient awake    Reviewed: Allergy & Precautions, H&P , Patient's Chart, lab work & pertinent test results, reviewed documented beta blocker date and time   Airway Mallampati: II  TM Distance: >3 FB Neck ROM: full    Dental no notable dental hx.    Pulmonary    Pulmonary exam normal breath sounds clear to auscultation       Cardiovascular hypertension, On Medications  Rhythm:regular Rate:Normal     Neuro/Psych    GI/Hepatic   Endo/Other    Renal/GU      Musculoskeletal   Abdominal   Peds  Hematology   Anesthesia Other Findings   Reproductive/Obstetrics                             Anesthesia Physical Anesthesia Plan  ASA: II  Anesthesia Plan:    Post-op Pain Management:    Induction: Intravenous  Airway Management Planned: LMA  Additional Equipment:   Intra-op Plan:   Post-operative Plan:   Informed Consent: I have reviewed the patients History and Physical, chart, labs and discussed the procedure including the risks, benefits and alternatives for the proposed anesthesia with the patient or authorized representative who has indicated his/her understanding and acceptance.   Dental Advisory Given and Dental advisory given  Plan Discussed with: CRNA and Surgeon  Anesthesia Plan Comments: (Discussed GA with LMA, possible sore throat, potential need to switch to ETT, N/V, pulmonary aspiration. Questions answered. )        Anesthesia Quick Evaluation

## 2015-09-20 NOTE — H&P (Signed)
Natasha Graham is an 55 y.o. female. Recurrent PMB for evaluation  Pertinent Gynecological History: Menses: flow is light Bleeding: post menopausal bleeding Contraception: none DES exposure: denies Blood transfusions: none Sexually transmitted diseases: no past history Previous GYN Procedures: na  Last mammogram: normal Date: na Last pap: normal Date: 2016 OB History: G4, P3   Menstrual History: Menarche age: 50  No LMP recorded. Patient is not currently having periods (Reason: Perimenopausal).    Past Medical History  Diagnosis Date  . Hypertension   . Wears glasses   . Cancer [facility])     breast cancer  . Scoliosis   . PONV (postoperative nausea and vomiting)     Past Surgical History  Procedure Laterality Date  . Scoliosis  1972    harrington rods-age 54  . Tonsillectomy    . Dilation and curettage of uterus    . Breast surgery      right breast excisional biopsy  . Mastectomy Right 05/16/2014    placement of acellular dermal matrix & tissue expanders   . Breast reconstruction with placement of tissue expander and flex hd (acellular hydrated dermis) Right 05/16/2014    Procedure: IMMEDIATE RIGHT BREAST RECONSTRUCTION WITH PLACEMENT OF TISSUE EXPANDER AND FLEX HD (ACELLULAR HYDRATED DERMIS);  Surgeon: Theodoro Kos, DO;  Location: [facility];  Service: Plastics;  Laterality: Right;  . Removal of tissue expander and placement of implant Right 08/08/2014    Procedure: REMOVAL OF RIGHT  TISSUE EXPANDERS WITH PLACEMENT OF RIGHT BREAST IMPLANTS WITH LIPO SUCTION ;  Surgeon: Theodoro Kos, DO;  Location: [facility];  Service: Plastics;  Laterality: Right;  . Liposuction Bilateral 08/08/2014    Procedure: LIPO SUCTION ;  Surgeon: Theodoro Kos, DO;  Location: [place];  Service: Plastics;  Laterality: Bilateral;  . Mastopexy Left 08/08/2014    Procedure:  MASTOPEXY FOR SYMMETRY;  Surgeon: Theodoro Kos, DO;  Location: [place] [facility];  Service:  Plastics;  Laterality: Left;    Family History  Problem Relation Age of Onset  . Cancer Father     liver    Social History:  reports that she has never smoked. She has never used smokeless tobacco. She reports that she does not drink alcohol or use illicit drugs.  Allergies: No Known Allergies  Prescriptions prior to admission  Medication Sig Dispense Refill Last Dose  . lisinopril-hydrochlorothiazide (PRINZIDE,ZESTORETIC) 20-25 MG per tablet Take 1 tablet by mouth every evening.    09/19/2015 at 1900    Review of Systems  Constitutional: Negative.   All other systems reviewed and are negative.   Blood pressure 152/80, pulse 72, temperature 97.7 F (36.5 C), temperature source Oral, resp. rate 18, height 5\' 3"  (1.6 m), weight 74.844 kg (165 lb), SpO2 100 %. Physical Exam  Nursing note and vitals reviewed. Constitutional: She appears well-developed and well-nourished.  HENT:  Head: Normocephalic.  Cardiovascular: Normal rate and regular rhythm.   Respiratory: Effort normal and breath sounds normal.  GI: Soft. Bowel sounds are normal.  Genitourinary: Vagina normal and uterus normal.  Musculoskeletal: Normal range of motion.  Skin: Skin is warm and dry.  Psychiatric: She has a normal mood and affect.    No results found for this or any previous visit (from the past 24 hour(s)).  No results found.  Assessment/Plan: PMB - recurrent, Thick endometrium- nl sonohys History of Breast CA HTN Proceed with Diag HS, D&C Risks of surgery noted and discussed. Consent done.  Leibish Mcgregor J 09/20/2015,  8:32 AM

## 2015-09-20 NOTE — Op Note (Signed)
09/20/2015  9:51 AM  PATIENT:  Natasha Graham  55 y.o. female  PRE-OPERATIVE DIAGNOSIS:  Postmenopausal Bleeding  POST-OPERATIVE DIAGNOSIS:  Postmenopausal Bleeding  PROCEDURE:  Procedure(s): DILATATION AND CURETTAGE /HYSTEROSCOPY  SURGEON:  Surgeon(s): Brien Few, MD  ASSISTANTS: none   ANESTHESIA:   local and general  ESTIMATED BLOOD LOSS: minimal and fluid deficit = 95cc   DRAINS: none   LOCAL MEDICATIONS USED:  MARCAINE    and Amount: 20 ml  SPECIMEN:  Source of Specimen:  [facility]  DISPOSITION OF SPECIMEN:  PATHOLOGY  COUNTS:  YES  DICTATION #: done  PLAN OF CARE: dc home  PATIENT DISPOSITION:  PACU - hemodynamically stable.

## 2015-09-20 NOTE — Discharge Instructions (Signed)

## 2015-09-20 NOTE — Anesthesia Procedure Notes (Signed)
Procedure Name: LMA Insertion Date/Time: 09/20/2015 9:16 AM Performed by: Georgeanne Nim Pre-anesthesia Checklist: Patient identified, Emergency Drugs available, Suction available, Patient being monitored and Timeout performed Patient Re-evaluated:Patient Re-evaluated prior to inductionOxygen Delivery Method: Circle system utilized Preoxygenation: Pre-oxygenation with 100% oxygen Intubation Type: IV induction Ventilation: Mask ventilation without difficulty LMA: LMA inserted LMA Size: 4.0 Number of attempts: 1 Placement Confirmation: positive ETCO2,  CO2 detector and breath sounds checked- equal and bilateral Tube secured with: Tape Dental Injury: Teeth and Oropharynx as per pre-operative assessment

## 2015-09-20 NOTE — Anesthesia Postprocedure Evaluation (Signed)
Anesthesia Post Note  Patient: Natasha Graham  Procedure(s) Performed: Procedure(s) (LRB): DILATATION AND CURETTAGE /HYSTEROSCOPY (N/A)  Patient location during evaluation: PACU Anesthesia Type: General Level of consciousness: sedated Pain management: satisfactory to patient Vital Signs Assessment: post-procedure vital signs reviewed and stable Respiratory status: spontaneous breathing Cardiovascular status: stable Anesthetic complications: no    Last Vitals:  Filed Vitals:   09/20/15 1100 09/20/15 1111  BP: 117/68   Pulse: 57 60  Temp:  36.7 C  Resp: 14 14    Last Pain: There were no vitals filed for this visit.               Riccardo Dubin

## 2015-09-23 ENCOUNTER — Encounter (HOSPITAL_COMMUNITY): Payer: Self-pay | Admitting: Obstetrics and Gynecology

## 2015-09-23 NOTE — Op Note (Signed)
NAMEKENIKA, Natasha Graham             ACCOUNT NO.:  [id number]  MEDICAL RECORD NO.:  [id number]  LOCATION:  [facility]                          FACILITY:  [facility]  PHYSICIAN:  Lovenia Kim, M.D.DATE OF BIRTH:  08/03/1961  DATE OF PROCEDURE:  09/20/2015 DATE OF DISCHARGE:  09/20/2015                              OPERATIVE REPORT   PREOPERATIVE DIAGNOSIS:  Postmenopausal bleeding.  POSTOPERATIVE DIAGNOSIS:  Postmenopausal bleeding.  PROCEDURE:  Diagnostic hysteroscopy, D and C.  SURGEON:  Lovenia Kim, M.D.  ASSISTANT:  None.  ANESTHESIA:  General.  ESTIMATED BLOOD LOSS:  Less than 50 mL.  COMPLICATIONS:  None.  DRAINS:  None.  COUNTS:  Correct.  DISPOSITION:  The patient to recovery in good condition.  BRIEF OPERATIVE NOTE:  After being apprised of risks of anesthesia, infection, bleeding, injury to surrounding organs, possible need for repair, delayed versus immediate complications to include bowel and bladder injury, possible need for repair, the patient was brought to the operating room where she was administered general anesthetic without complications, prepped and draped in usual sterile fashion, catheterized until the bladder was empty.  Exam under anesthesia revealed an anteflexed uterus and no adnexal masses.  At this time, paracervical block with 20 mL of dilute Marcaine solution.  Cervix easily dilated up to 23 Pratt dilator.  Hysteroscope placed.  Visualization revealed a normal endometrial cavity with a subtle appearance of a submucous fibroid in the right lateral fundal area, not impinging upon the cavity but coming to the surface of the cavity.  At this time, D and C was performed using sharp curettage in a 4-quadrant method.  Good hemostasis was noted. Instruments were removed.  The patient tolerated the procedure well, was awakened and transferred to recovery in good condition.     Lovenia Kim, M.D.     RJT/MEDQ  D:  09/22/2015  T:   09/22/2015  Job:  [id number]

## 2015-11-01 ENCOUNTER — Other Ambulatory Visit: Payer: Self-pay

## 2015-11-01 DIAGNOSIS — Z1231 Encounter for screening mammogram for malignant neoplasm of breast: Secondary | ICD-10-CM

## 2015-11-01 DIAGNOSIS — Z9011 Acquired absence of right breast and nipple: Secondary | ICD-10-CM

## 2015-11-23 IMAGING — CR DG CHEST 2V
2 series · 2 of 2 positions shown · non-contrast
Comparison: 07/27/2006

CLINICAL DATA: Preoperative image, hypertension

EXAM:
CHEST  2 VIEW

[w chest pa]
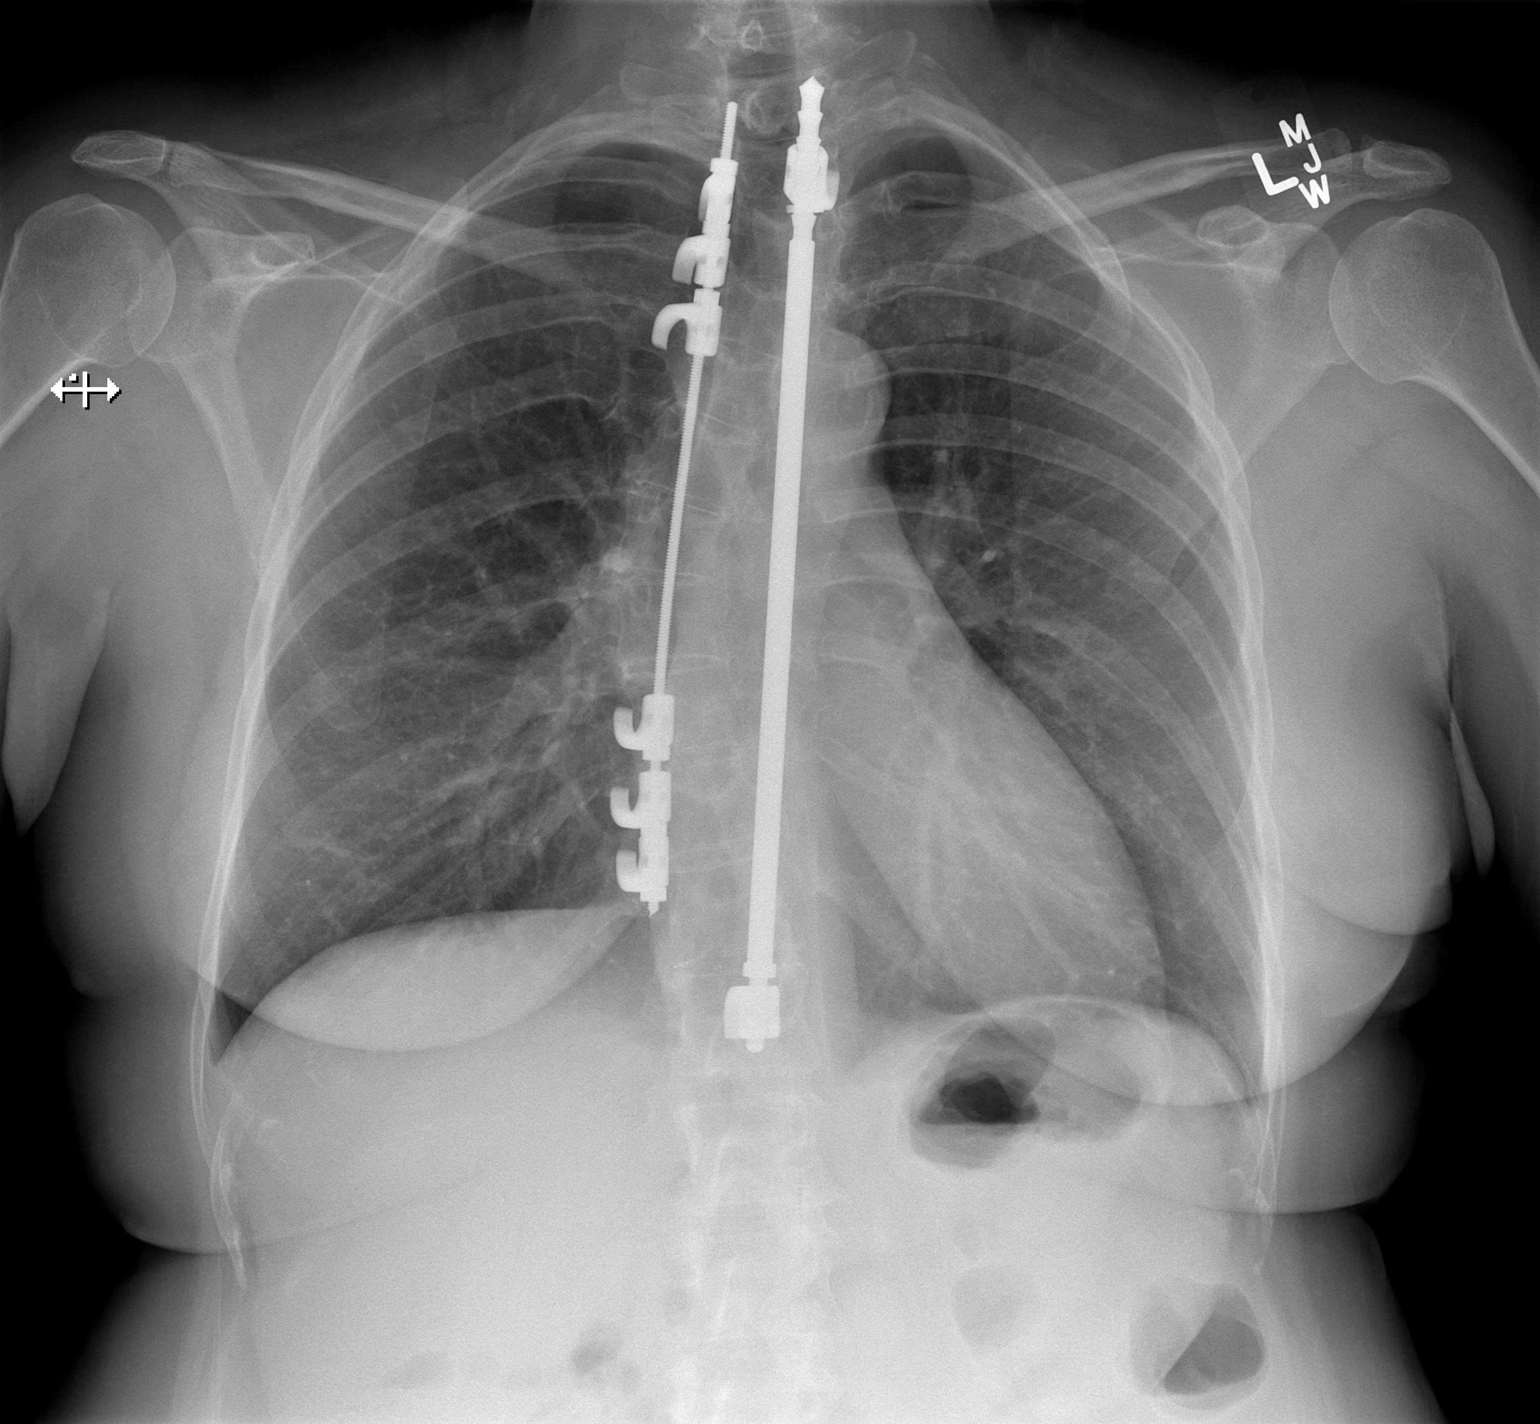

[w chest lat]
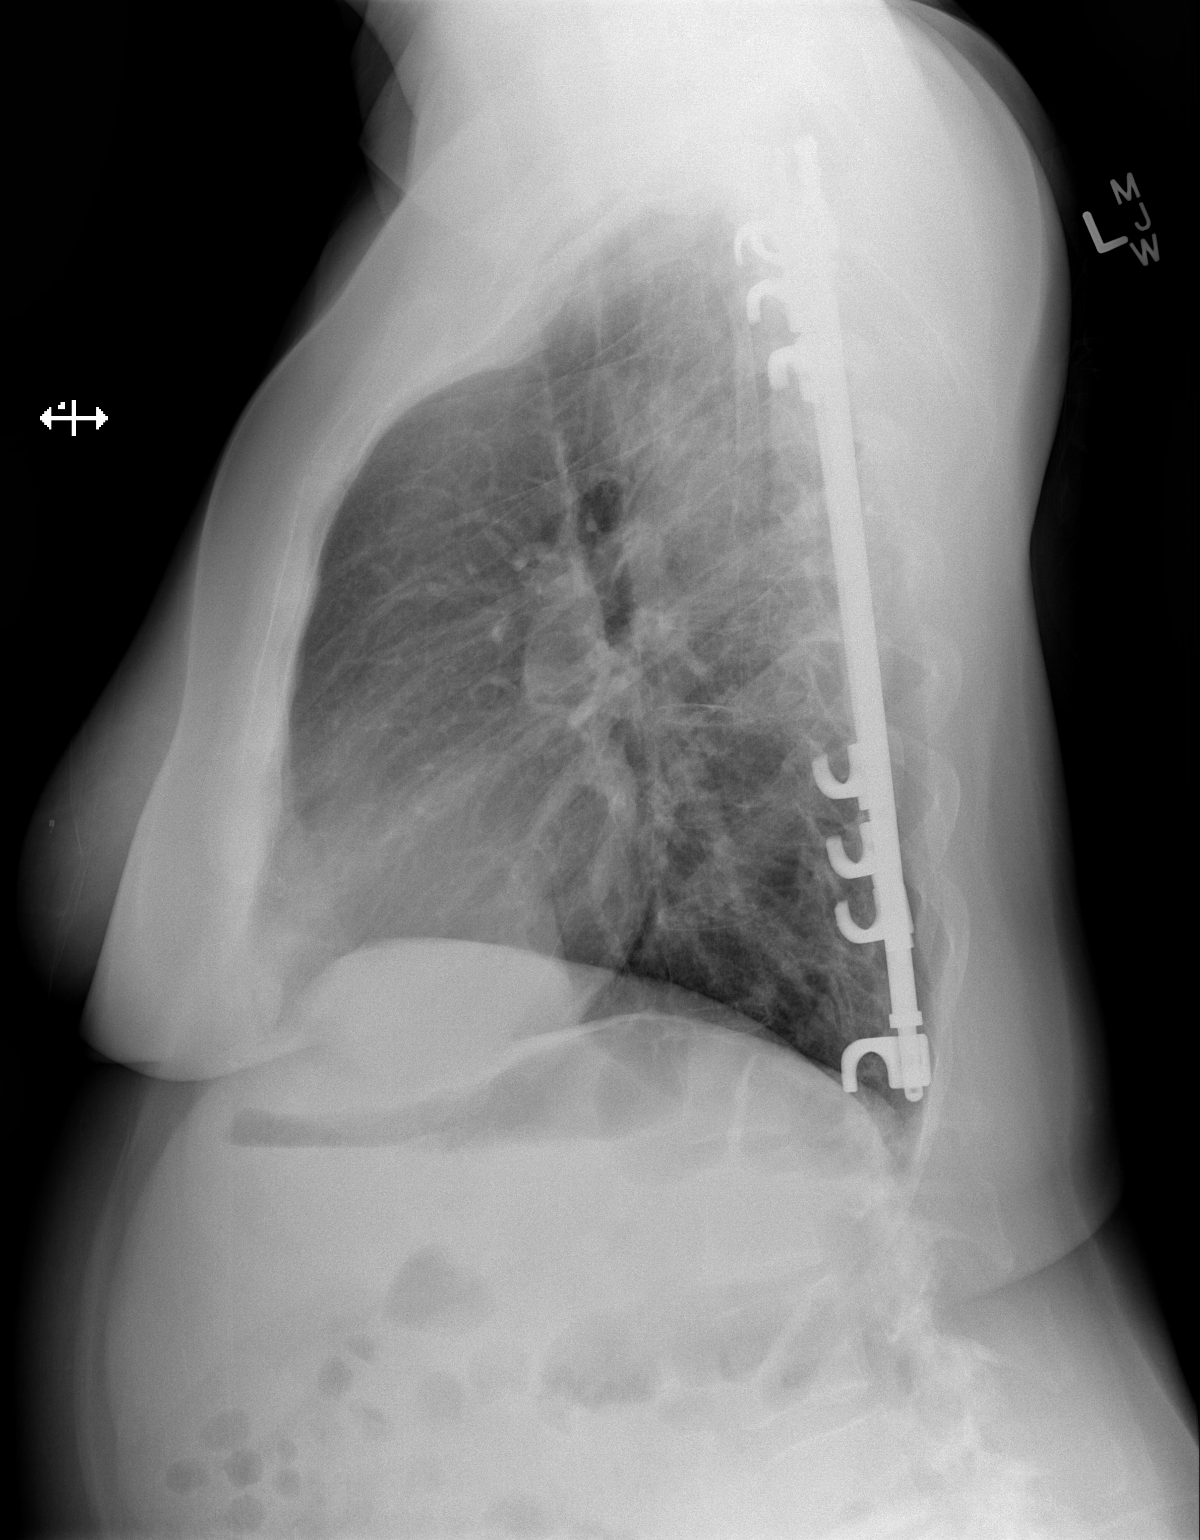

[2 of 2 positions shown; findings below may reference images not displayed]

FINDINGS: Stable thoracic spine Harrington rods. Stable mild sigmoid scoliotic
curvature. Heart size and vascular pattern are normal. Lungs are
clear.
IMPRESSION: No active cardiopulmonary disease.

## 2016-02-10 DIAGNOSIS — D2261 Melanocytic nevi of right upper limb, including shoulder: Secondary | ICD-10-CM | POA: Diagnosis not present

## 2016-02-10 DIAGNOSIS — B078 Other viral warts: Secondary | ICD-10-CM | POA: Diagnosis not present

## 2016-02-10 DIAGNOSIS — L821 Other seborrheic keratosis: Secondary | ICD-10-CM | POA: Diagnosis not present

## 2016-02-10 DIAGNOSIS — D2272 Melanocytic nevi of left lower limb, including hip: Secondary | ICD-10-CM | POA: Diagnosis not present

## 2016-02-19 ENCOUNTER — Ambulatory Visit: Payer: BLUE CROSS/BLUE SHIELD

## 2016-02-24 ENCOUNTER — Ambulatory Visit
Admission: RE | Admit: 2016-02-24 | Discharge: 2016-02-24 | Disposition: A | Discharge status: Home / Self Care | Payer: BLUE CROSS/BLUE SHIELD | Source: Ambulatory Visit

## 2016-02-24 DIAGNOSIS — Z9011 Acquired absence of right breast and nipple: Secondary | ICD-10-CM

## 2016-02-24 DIAGNOSIS — Z1231 Encounter for screening mammogram for malignant neoplasm of breast: Secondary | ICD-10-CM

## 2016-07-27 DIAGNOSIS — M25651 Stiffness of right hip, not elsewhere classified: Secondary | ICD-10-CM | POA: Diagnosis not present

## 2016-07-27 DIAGNOSIS — M25652 Stiffness of left hip, not elsewhere classified: Secondary | ICD-10-CM | POA: Diagnosis not present

## 2016-07-27 DIAGNOSIS — M6281 Muscle weakness (generalized): Secondary | ICD-10-CM | POA: Diagnosis not present

## 2016-07-27 DIAGNOSIS — M5093 Cervical disc disorder, unspecified, cervicothoracic region: Secondary | ICD-10-CM | POA: Diagnosis not present

## 2016-08-07 DIAGNOSIS — M25651 Stiffness of right hip, not elsewhere classified: Secondary | ICD-10-CM | POA: Diagnosis not present

## 2016-08-07 DIAGNOSIS — M6281 Muscle weakness (generalized): Secondary | ICD-10-CM | POA: Diagnosis not present

## 2016-08-07 DIAGNOSIS — M5093 Cervical disc disorder, unspecified, cervicothoracic region: Secondary | ICD-10-CM | POA: Diagnosis not present

## 2016-08-07 DIAGNOSIS — M25652 Stiffness of left hip, not elsewhere classified: Secondary | ICD-10-CM | POA: Diagnosis not present

## 2016-08-14 DIAGNOSIS — M25651 Stiffness of right hip, not elsewhere classified: Secondary | ICD-10-CM | POA: Diagnosis not present

## 2016-08-14 DIAGNOSIS — M6281 Muscle weakness (generalized): Secondary | ICD-10-CM | POA: Diagnosis not present

## 2016-08-14 DIAGNOSIS — M25652 Stiffness of left hip, not elsewhere classified: Secondary | ICD-10-CM | POA: Diagnosis not present

## 2016-08-14 DIAGNOSIS — M5093 Cervical disc disorder, unspecified, cervicothoracic region: Secondary | ICD-10-CM | POA: Diagnosis not present

## 2016-08-28 DIAGNOSIS — D0591 Unspecified type of carcinoma in situ of right breast: Secondary | ICD-10-CM | POA: Diagnosis not present

## 2016-12-04 DIAGNOSIS — R7303 Prediabetes: Secondary | ICD-10-CM | POA: Diagnosis not present

## 2016-12-04 DIAGNOSIS — E663 Overweight: Secondary | ICD-10-CM | POA: Diagnosis not present

## 2016-12-04 DIAGNOSIS — Z1389 Encounter for screening for other disorder: Secondary | ICD-10-CM | POA: Diagnosis not present

## 2016-12-04 DIAGNOSIS — Z6829 Body mass index (BMI) 29.0-29.9, adult: Secondary | ICD-10-CM | POA: Diagnosis not present

## 2016-12-04 DIAGNOSIS — I10 Essential (primary) hypertension: Secondary | ICD-10-CM | POA: Diagnosis not present

## 2017-03-09 ENCOUNTER — Other Ambulatory Visit: Payer: Self-pay | Admitting: Obstetrics and Gynecology

## 2017-03-09 DIAGNOSIS — Z1231 Encounter for screening mammogram for malignant neoplasm of breast: Secondary | ICD-10-CM

## 2017-03-30 DIAGNOSIS — Z9011 Acquired absence of right breast and nipple: Secondary | ICD-10-CM | POA: Diagnosis not present

## 2017-03-30 DIAGNOSIS — Z9889 Other specified postprocedural states: Secondary | ICD-10-CM | POA: Diagnosis not present

## 2017-03-30 DIAGNOSIS — Z853 Personal history of malignant neoplasm of breast: Secondary | ICD-10-CM | POA: Diagnosis not present

## 2017-03-31 ENCOUNTER — Ambulatory Visit
Admission: RE | Admit: 2017-03-31 | Discharge: 2017-03-31 | Disposition: A | Discharge status: Home / Self Care | Payer: BLUE CROSS/BLUE SHIELD | Source: Ambulatory Visit | Attending: Obstetrics and Gynecology | Admitting: Obstetrics and Gynecology

## 2017-03-31 DIAGNOSIS — Z1231 Encounter for screening mammogram for malignant neoplasm of breast: Secondary | ICD-10-CM

## 2017-03-31 DIAGNOSIS — Z6829 Body mass index (BMI) 29.0-29.9, adult: Secondary | ICD-10-CM | POA: Diagnosis not present

## 2017-03-31 DIAGNOSIS — Z1151 Encounter for screening for human papillomavirus (HPV): Secondary | ICD-10-CM | POA: Diagnosis not present

## 2017-03-31 DIAGNOSIS — Z01419 Encounter for gynecological examination (general) (routine) without abnormal findings: Secondary | ICD-10-CM | POA: Diagnosis not present

## 2017-03-31 DIAGNOSIS — D0511 Intraductal carcinoma in situ of right breast: Secondary | ICD-10-CM | POA: Diagnosis not present

## 2017-04-02 DIAGNOSIS — D2272 Melanocytic nevi of left lower limb, including hip: Secondary | ICD-10-CM | POA: Diagnosis not present

## 2017-04-02 DIAGNOSIS — D2262 Melanocytic nevi of left upper limb, including shoulder: Secondary | ICD-10-CM | POA: Diagnosis not present

## 2017-04-02 DIAGNOSIS — D2271 Melanocytic nevi of right lower limb, including hip: Secondary | ICD-10-CM | POA: Diagnosis not present

## 2017-04-02 DIAGNOSIS — D2261 Melanocytic nevi of right upper limb, including shoulder: Secondary | ICD-10-CM | POA: Diagnosis not present

## 2017-04-02 DIAGNOSIS — D485 Neoplasm of uncertain behavior of skin: Secondary | ICD-10-CM | POA: Diagnosis not present

## 2017-12-03 DIAGNOSIS — I10 Essential (primary) hypertension: Secondary | ICD-10-CM | POA: Diagnosis not present

## 2017-12-03 DIAGNOSIS — R7303 Prediabetes: Secondary | ICD-10-CM | POA: Diagnosis not present

## 2017-12-03 DIAGNOSIS — Z6830 Body mass index (BMI) 30.0-30.9, adult: Secondary | ICD-10-CM | POA: Diagnosis not present

## 2017-12-03 DIAGNOSIS — E041 Nontoxic single thyroid nodule: Secondary | ICD-10-CM | POA: Diagnosis not present

## 2018-02-17 ENCOUNTER — Other Ambulatory Visit: Payer: Self-pay | Admitting: Obstetrics and Gynecology

## 2018-02-17 DIAGNOSIS — Z1231 Encounter for screening mammogram for malignant neoplasm of breast: Secondary | ICD-10-CM

## 2018-03-14 ENCOUNTER — Ambulatory Visit
Admission: RE | Admit: 2018-03-14 | Discharge: 2018-03-14 | Disposition: A | Discharge status: Home / Self Care | Payer: BLUE CROSS/BLUE SHIELD | Source: Ambulatory Visit | Attending: Obstetrics and Gynecology | Admitting: Obstetrics and Gynecology

## 2018-03-14 DIAGNOSIS — Z1231 Encounter for screening mammogram for malignant neoplasm of breast: Secondary | ICD-10-CM | POA: Diagnosis not present

## 2018-03-16 ENCOUNTER — Other Ambulatory Visit: Payer: Self-pay | Admitting: Obstetrics and Gynecology

## 2018-03-16 DIAGNOSIS — R928 Other abnormal and inconclusive findings on diagnostic imaging of breast: Secondary | ICD-10-CM

## 2018-03-18 ENCOUNTER — Ambulatory Visit
Admission: RE | Admit: 2018-03-18 | Discharge: 2018-03-18 | Disposition: A | Discharge status: Home / Self Care | Payer: BLUE CROSS/BLUE SHIELD | Source: Ambulatory Visit | Attending: Obstetrics and Gynecology | Admitting: Obstetrics and Gynecology

## 2018-03-18 DIAGNOSIS — R921 Mammographic calcification found on diagnostic imaging of breast: Secondary | ICD-10-CM | POA: Diagnosis not present

## 2018-03-18 DIAGNOSIS — N6489 Other specified disorders of breast: Secondary | ICD-10-CM | POA: Diagnosis not present

## 2018-03-18 DIAGNOSIS — R928 Other abnormal and inconclusive findings on diagnostic imaging of breast: Secondary | ICD-10-CM

## 2018-03-22 DIAGNOSIS — Z1151 Encounter for screening for human papillomavirus (HPV): Secondary | ICD-10-CM | POA: Diagnosis not present

## 2018-03-22 DIAGNOSIS — Z6830 Body mass index (BMI) 30.0-30.9, adult: Secondary | ICD-10-CM | POA: Diagnosis not present

## 2018-03-22 DIAGNOSIS — Z01419 Encounter for gynecological examination (general) (routine) without abnormal findings: Secondary | ICD-10-CM | POA: Diagnosis not present

## 2018-03-29 DIAGNOSIS — S92151A Displaced avulsion fracture (chip fracture) of right talus, initial encounter for closed fracture: Secondary | ICD-10-CM | POA: Diagnosis not present

## 2018-03-29 DIAGNOSIS — S99911A Unspecified injury of right ankle, initial encounter: Secondary | ICD-10-CM | POA: Diagnosis not present

## 2018-03-31 DIAGNOSIS — M25571 Pain in right ankle and joints of right foot: Secondary | ICD-10-CM | POA: Diagnosis not present

## 2018-03-31 DIAGNOSIS — S93491A Sprain of other ligament of right ankle, initial encounter: Secondary | ICD-10-CM | POA: Diagnosis not present

## 2019-02-28 DIAGNOSIS — R7303 Prediabetes: Secondary | ICD-10-CM | POA: Diagnosis not present

## 2019-02-28 DIAGNOSIS — Z6830 Body mass index (BMI) 30.0-30.9, adult: Secondary | ICD-10-CM | POA: Diagnosis not present

## 2019-02-28 DIAGNOSIS — I10 Essential (primary) hypertension: Secondary | ICD-10-CM | POA: Diagnosis not present

## 2019-02-28 DIAGNOSIS — Z1331 Encounter for screening for depression: Secondary | ICD-10-CM | POA: Diagnosis not present

## 2019-04-12 ENCOUNTER — Other Ambulatory Visit: Payer: Self-pay | Admitting: Obstetrics and Gynecology

## 2019-04-12 DIAGNOSIS — Z1231 Encounter for screening mammogram for malignant neoplasm of breast: Secondary | ICD-10-CM

## 2019-06-14 DIAGNOSIS — Z01419 Encounter for gynecological examination (general) (routine) without abnormal findings: Secondary | ICD-10-CM | POA: Diagnosis not present

## 2019-06-21 ENCOUNTER — Other Ambulatory Visit: Payer: Self-pay | Admitting: Obstetrics and Gynecology

## 2019-06-21 DIAGNOSIS — N6489 Other specified disorders of breast: Secondary | ICD-10-CM

## 2019-07-07 ENCOUNTER — Other Ambulatory Visit: Payer: Self-pay | Admitting: Obstetrics and Gynecology

## 2019-07-07 ENCOUNTER — Other Ambulatory Visit: Payer: Self-pay

## 2019-07-07 ENCOUNTER — Ambulatory Visit
Admission: RE | Admit: 2019-07-07 | Discharge: 2019-07-07 | Disposition: A | Discharge status: Home / Self Care | Payer: BLUE CROSS/BLUE SHIELD | Source: Ambulatory Visit | Attending: Obstetrics and Gynecology | Admitting: Obstetrics and Gynecology

## 2019-07-07 DIAGNOSIS — R928 Other abnormal and inconclusive findings on diagnostic imaging of breast: Secondary | ICD-10-CM | POA: Diagnosis not present

## 2019-07-07 DIAGNOSIS — N6489 Other specified disorders of breast: Secondary | ICD-10-CM

## 2019-07-07 DIAGNOSIS — N6320 Unspecified lump in the left breast, unspecified quadrant: Secondary | ICD-10-CM

## 2019-07-07 DIAGNOSIS — N6324 Unspecified lump in the left breast, lower inner quadrant: Secondary | ICD-10-CM | POA: Diagnosis not present

## 2019-07-12 ENCOUNTER — Ambulatory Visit
Admission: RE | Admit: 2019-07-12 | Discharge: 2019-07-12 | Disposition: A | Discharge status: Home / Self Care | Payer: BLUE CROSS/BLUE SHIELD | Source: Ambulatory Visit | Attending: Obstetrics and Gynecology | Admitting: Obstetrics and Gynecology

## 2019-07-12 ENCOUNTER — Other Ambulatory Visit: Payer: Self-pay

## 2019-07-12 DIAGNOSIS — C50812 Malignant neoplasm of overlapping sites of left female breast: Secondary | ICD-10-CM | POA: Diagnosis not present

## 2019-07-12 DIAGNOSIS — N6320 Unspecified lump in the left breast, unspecified quadrant: Secondary | ICD-10-CM

## 2019-07-12 DIAGNOSIS — N6325 Unspecified lump in the left breast, overlapping quadrants: Secondary | ICD-10-CM | POA: Diagnosis not present

## 2019-08-21 ENCOUNTER — Ambulatory Visit (INDEPENDENT_AMBULATORY_CARE_PROVIDER_SITE_OTHER): Payer: BLUE CROSS/BLUE SHIELD | Admitting: Plastic Surgery

## 2019-08-21 ENCOUNTER — Other Ambulatory Visit: Payer: Self-pay

## 2019-08-21 ENCOUNTER — Encounter: Payer: Self-pay | Admitting: Plastic Surgery

## 2019-08-21 DIAGNOSIS — C50912 Malignant neoplasm of unspecified site of left female breast: Secondary | ICD-10-CM | POA: Diagnosis not present

## 2019-08-21 DIAGNOSIS — Z9011 Acquired absence of right breast and nipple: Secondary | ICD-10-CM | POA: Diagnosis not present

## 2019-08-21 DIAGNOSIS — Z9889 Other specified postprocedural states: Secondary | ICD-10-CM | POA: Diagnosis not present

## 2019-08-21 NOTE — Progress Notes (Addendum)
Patient ID: Natasha Graham, female    DOB: 1961/03/29, 58 y.o.   MRN: [id number]   Chief Complaint  Patient presents with  . Breast Cancer    The patient is a 58 year old female here for a consultation for breast reconstruction.  In September 2015 the patient underwent a right mastectomy (skin and nipple sparing) with expander placement.  In December 2015 she had a right saline implant placed and a left mastopexy for symmetry.  A Mentor smooth round high profile saline implant was placed with 600 cc of saline infused.  She is extremely happy with her results.  She had a mammogram 1 month ago and was found to have irregularities which led to a diagnosis of left lobular breast cancer.  She is 5 feet 4 inches tall and weighs 167 pounds.  She is on medication for hypertension and is otherwise doing very well.  Her scars of all healed nicely.  Her cancer is in the vertical limb of the mastopexy scar at the inframammary fold.  It is palpable and firm.  Her general surgeon before was Dr. Donne Hazel and will be this time as well.   Review of Systems  Constitutional: Negative.  Negative for activity change.  HENT: Negative.   Eyes: Negative.   Respiratory: Negative.   Cardiovascular: Negative.  Negative for leg swelling.  Gastrointestinal: Negative.  Negative for abdominal pain.  Endocrine: Negative.   Genitourinary: Negative.   Musculoskeletal: Negative.   Skin: Negative.   Hematological: Negative.   Psychiatric/Behavioral: Negative.     Past Medical History:  Diagnosis Date  . Cancer [facility])    breast cancer  . Hypertension   . PONV (postoperative nausea and vomiting)   . Scoliosis   . Wears glasses     Past Surgical History:  Procedure Laterality Date  . AUGMENTATION MAMMAPLASTY Right    2015 post mastectomy  . BREAST RECONSTRUCTION WITH PLACEMENT OF TISSUE EXPANDER AND FLEX HD (ACELLULAR HYDRATED DERMIS) Right 05/16/2014   Procedure: IMMEDIATE RIGHT BREAST RECONSTRUCTION WITH  PLACEMENT OF TISSUE EXPANDER AND FLEX HD (ACELLULAR HYDRATED DERMIS);  Surgeon: Theodoro Kos, DO;  Location: [facility];  Service: Plastics;  Laterality: Right;  . BREAST SURGERY     right breast excisional biopsy  . DILATION AND CURETTAGE OF UTERUS    . HYSTEROSCOPY WITH D & C N/A 09/20/2015   Procedure: DILATATION AND CURETTAGE /HYSTEROSCOPY;  Surgeon: Brien Few, MD;  Location: [facility];  Service: Gynecology;  Laterality: N/A;  . LIPOSUCTION Bilateral 08/08/2014   Procedure: LIPO SUCTION ;  Surgeon: Theodoro Kos, DO;  Location: [place];  Service: Plastics;  Laterality: Bilateral;  . MASTECTOMY Right 05/16/2014   placement of acellular dermal matrix & tissue expanders   . MASTOPEXY Left 08/08/2014   Procedure:  MASTOPEXY FOR SYMMETRY;  Surgeon: Theodoro Kos, DO;  Location: [place];  Service: Plastics;  Laterality: Left;  . REDUCTION MAMMAPLASTY Left    2015  . REMOVAL OF TISSUE EXPANDER AND PLACEMENT OF IMPLANT Right 08/08/2014   Procedure: REMOVAL OF RIGHT  TISSUE EXPANDERS WITH PLACEMENT OF RIGHT BREAST IMPLANTS WITH LIPO SUCTION ;  Surgeon: Theodoro Kos, DO;  Location: [place];  Service: Plastics;  Laterality: Right;  . scoliosis  1972   harrington rods-age 80  . TONSILLECTOMY        Current Outpatient Medications:  .  HYDROcodone-ibuprofen (VICOPROFEN) 7.5-200 MG tablet, Take 1 tablet by mouth every 8 (eight) hours as needed  for moderate pain. (Patient not taking: Reported on 08/21/2019), Disp: 30 tablet, Rfl: 0 .  lisinopril-hydrochlorothiazide (PRINZIDE,ZESTORETIC) 20-25 MG per tablet, Take 1 tablet by mouth every evening. , Disp: , Rfl:    Objective:   Vitals:   08/21/19 0916  BP: (!) 156/76  Pulse: 85  Temp: (!) 97.3 F (36.3 C)  SpO2: 96%    Physical Exam Vitals and nursing note reviewed.  Constitutional:      Appearance: Normal appearance.  Eyes:     Extraocular Movements: Extraocular movements intact.    Cardiovascular:     Rate and Rhythm: Normal rate.     Pulses: Normal pulses.  Pulmonary:     Effort: Pulmonary effort is normal. No respiratory distress.  Chest:    Abdominal:     General: Abdomen is flat. There is no distension.     Tenderness: There is no abdominal tenderness.  Neurological:     General: No focal deficit present.     Mental Status: She is alert and oriented to person, place, and time.  Psychiatric:        Mood and Affect: Mood normal.        Behavior: Behavior normal.        Thought Content: Thought content normal.     Assessment & Plan:  Lobular breast cancer, left (HCC)  Acquired absence of right breast  Status post right breast reconstruction  We will plan on immediate breast reconstruction with expander placement.  The patient was not interested in other reconstructive options but they were offered she would like to be as symmetric as possible and would like to have the same type of reconstruction and a saline implant again.  This is completely understandable as she has a great result.  Pictures were obtained of the patient and placed in the chart with the patient's or guardian's permission.  The [place] was signed into law in 2016 which includes the topic of electronic health records.  This provides immediate access to information in MyChart.  This includes consultation notes, operative notes, office notes, lab results and pathology reports.  If you have any questions about what you read please let us know at your next visit or call us at the office.  We are right here with you.     [place], DO

## 2019-08-30 ENCOUNTER — Other Ambulatory Visit: Payer: Self-pay | Admitting: General Surgery

## 2019-08-30 DIAGNOSIS — C50912 Malignant neoplasm of unspecified site of left female breast: Secondary | ICD-10-CM

## 2019-09-15 ENCOUNTER — Ambulatory Visit (INDEPENDENT_AMBULATORY_CARE_PROVIDER_SITE_OTHER): Payer: BC Managed Care – PPO | Admitting: Plastic Surgery

## 2019-09-15 ENCOUNTER — Other Ambulatory Visit: Payer: Self-pay

## 2019-09-15 ENCOUNTER — Encounter: Payer: Self-pay | Admitting: Plastic Surgery

## 2019-09-15 VITALS — BP 150/86 | HR 73 | Temp 97.8°F | Ht 66.0 in | Wt 172.6 lb

## 2019-09-15 DIAGNOSIS — C50912 Malignant neoplasm of unspecified site of left female breast: Secondary | ICD-10-CM

## 2019-09-15 MED ORDER — CEPHALEXIN 500 MG PO CAPS: 500.0000 mg | ORAL_CAPSULE | Freq: Four times a day (QID) | ORAL | 0 refills | Status: AC

## 2019-09-15 MED ORDER — ONDANSETRON HCL 4 MG PO TABS: 4.0000 mg | ORAL_TABLET | Freq: Three times a day (TID) | ORAL | 0 refills | Status: DC | PRN

## 2019-09-15 MED ORDER — HYDROCODONE-ACETAMINOPHEN 5-325 MG PO TABS: 1.0000 | ORAL_TABLET | Freq: Three times a day (TID) | ORAL | 0 refills | Status: AC | PRN

## 2019-09-15 MED ORDER — DIAZEPAM 2 MG PO TABS: 2.0000 mg | ORAL_TABLET | Freq: Two times a day (BID) | ORAL | 0 refills | Status: DC | PRN

## 2019-09-15 NOTE — H&P (View-Only) (Signed)
ICD-10-CM   1. Lobular breast cancer, left [facility])  C50.912       Patient ID: Natasha Graham, female    DOB: Sep 18, 1960, 59 y.o.   MRN: [id number]   History of Present Illness: Natasha Graham is a 59 y.o.  female  with a history of left lobular cancer.  She presents for preoperative evaluation for upcoming procedure, left nipple sparing mastectomy with left axillary sentinel node biopsy with Dr. Donne Hazel and immediate left breast reconstruction with placement of tissue expanders and Flex HD with Dr. Marla Roe, scheduled for 09/27/2019.  In September 2015 the patient underwent a right mastectomy (skin and nipple sparing) with expander placement.  In December 2015 she had a right saline implant placed and left mastopexy for symmetry.  A Mentor smooth round high profile saline implant was placed with 600 cc of saline infused. She is very pleased with the results.  1 month ago she had a mammogram with irregularities which led to diagnosis of left lobular breast cancer.  Her cancer is in the vertical limb of the mastopexy scar at the [facility].  She is a non-smoker with hx of HTN.  The patient has had problems with anesthesia; nausea.  Past Medical History: Allergies: No Known Allergies  Current Medications:  Current Outpatient Medications:  .  Flaxseed Oil (LINSEED OIL) OIL, by Misc.(Non-Drug; Combo Route) route., Disp: , Rfl:  .  lisinopril-hydrochlorothiazide (PRINZIDE,ZESTORETIC) 20-25 MG per tablet, Take 1 tablet by mouth every evening. , Disp: , Rfl:  .  Multiple Vitamin (MULTIVITAMIN) capsule, Take by mouth., Disp: , Rfl:  .  Pediatric Multivitamins-Fl (MULTI VIT/FL PO), Take by mouth., Disp: , Rfl:  .  Turmeric (QC TUMERIC COMPLEX) 500 MG CAPS, Take by mouth., Disp: , Rfl:   Past Medical Problems: Past Medical History:  Diagnosis Date  . Cancer [facility])    breast cancer  . Hypertension   . PONV (postoperative nausea and vomiting)   . Scoliosis   . Wears glasses     Past  Surgical History: Past Surgical History:  Procedure Laterality Date  . AUGMENTATION MAMMAPLASTY Right    2015 post mastectomy  . BREAST RECONSTRUCTION WITH PLACEMENT OF TISSUE EXPANDER AND FLEX HD (ACELLULAR HYDRATED DERMIS) Right 05/16/2014   Procedure: IMMEDIATE RIGHT BREAST RECONSTRUCTION WITH PLACEMENT OF TISSUE EXPANDER AND FLEX HD (ACELLULAR HYDRATED DERMIS);  Surgeon: Theodoro Kos, DO;  Location: [place];  Service: Plastics;  Laterality: Right;  . BREAST SURGERY     right breast excisional biopsy  . DILATION AND CURETTAGE OF UTERUS    . HYSTEROSCOPY WITH D & C N/A 09/20/2015   Procedure: DILATATION AND CURETTAGE /HYSTEROSCOPY;  Surgeon: Brien Few, MD;  Location: [facility];  Service: Gynecology;  Laterality: N/A;  . LIPOSUCTION Bilateral 08/08/2014   Procedure: LIPO SUCTION ;  Surgeon: Theodoro Kos, DO;  Location: [place];  Service: Plastics;  Laterality: Bilateral;  . MASTECTOMY Right 05/16/2014   placement of acellular dermal matrix & tissue expanders   . MASTOPEXY Left 08/08/2014   Procedure:  MASTOPEXY FOR SYMMETRY;  Surgeon: Theodoro Kos, DO;  Location: [place];  Service: Plastics;  Laterality: Left;  . REDUCTION MAMMAPLASTY Left    2015  . REMOVAL OF TISSUE EXPANDER AND PLACEMENT OF IMPLANT Right 08/08/2014   Procedure: REMOVAL OF RIGHT  TISSUE EXPANDERS WITH PLACEMENT OF RIGHT BREAST IMPLANTS WITH LIPO SUCTION ;  Surgeon: Theodoro Kos, DO;  Location: [place];  Service: Plastics;  Laterality: Right;  . scoliosis  1972   harrington rods-age 63  . TONSILLECTOMY      Social History: Social History   Socioeconomic History  . Marital status: Married    Spouse name: Not on file  . Number of children: Not on file  . Years of education: Not on file  . Highest education level: Not on file  Occupational History  . Not on file  Tobacco Use  . Smoking status: Never Smoker  . Smokeless tobacco: Never Used  Substance and  Sexual Activity  . Alcohol use: No  . Drug use: No  . Sexual activity: Not on file  Other Topics Concern  . Not on file  Social History Narrative  . Not on file   Social Determinants of Health   Financial Resource Strain:   . Difficulty of Paying Living Expenses: Not on file  Food Insecurity:   . Worried About Charity fundraiser in the Last Year: Not on file  . Ran Out of Food in the Last Year: Not on file  Transportation Needs:   . Lack of Transportation (Medical): Not on file  . Lack of Transportation (Non-Medical): Not on file  Physical Activity:   . Days of Exercise per Week: Not on file  . Minutes of Exercise per Session: Not on file  Stress:   . Feeling of Stress : Not on file  Social Connections:   . Frequency of Communication with Friends and Family: Not on file  . Frequency of Social Gatherings with Friends and Family: Not on file  . Attends Religious Services: Not on file  . Active Member of Clubs or Organizations: Not on file  . Attends Archivist Meetings: Not on file  . Marital Status: Not on file  Intimate Partner Violence:   . Fear of Current or Ex-Partner: Not on file  . Emotionally Abused: Not on file  . Physically Abused: Not on file  . Sexually Abused: Not on file    Family History: Family History  Problem Relation Age of Onset  . Cancer Father        liver  . Breast cancer Paternal Aunt     Review of Systems: Review of Systems  Constitutional: Negative for chills and fever.  HENT: Negative for congestion and sore throat.   Respiratory: Negative for cough and shortness of breath.   Cardiovascular: Negative for chest pain.  Gastrointestinal: Negative for abdominal pain, nausea and vomiting.  Musculoskeletal: Negative for joint pain and myalgias.  Skin: Negative for itching and rash.    Physical Exam: Vital Signs BP (!) 150/86 (BP Location: Left Arm, Patient Position: Sitting, Cuff Size: Large)   Pulse 73   Temp 97.8 F (36.6  C) (Temporal)   Ht 5\' 6"  (1.676 m)   Wt 172 lb 9.6 oz (78.3 kg)   SpO2 100%   BMI 27.86 kg/m  Physical Exam Constitutional:      Appearance: Normal appearance. She is normal weight.  HENT:     Head: Normocephalic and atraumatic.  Eyes:     Extraocular Movements: Extraocular movements intact.  Cardiovascular:     Rate and Rhythm: Normal rate and regular rhythm.     Pulses: Normal pulses.     Heart sounds: Normal heart sounds.  Pulmonary:     Effort: Pulmonary effort is normal.     Breath sounds: Normal breath sounds. No wheezing, rhonchi or rales.  Abdominal:     General: Bowel sounds are normal.  Palpations: Abdomen is soft.  Musculoskeletal:        General: No swelling. Normal range of motion.     Cervical back: Normal range of motion.  Skin:    General: Skin is warm and dry.     Coloration: Skin is not pale.     Findings: No erythema or rash.  Neurological:     General: No focal deficit present.     Mental Status: She is alert and oriented to person, place, and time.  Psychiatric:        Mood and Affect: Mood normal.        Behavior: Behavior normal.        Thought Content: Thought content normal.        Judgment: Judgment normal.    Assessment/Plan:  Natasha Graham is here today for pre-op evaluation due to left lobular breast cancer. In 2015 she had right breast cancer and underwent a nipple sparing mastectomy. She also had right side reconstruction with a 600 cc saline implant and a left side mastopexy for symmetry.  She is very happy with the reconstruction on her right side and would like to achieve similar results on the left side.  She is a non-smoker with a hx of HTN.  She is scheduled for left nipple sparing mastectomy with left axillary sentinel node biopsy with Dr. Donne Hazel and immediate left breast reconstruction with placement of tissue expanders and Flex HD with Dr. Marla Roe, scheduled for 09/27/2019.  Risks, benefits, and alternatives of procedure  discussed, questions answered and consent obtained.    Caprini score: High. Recommendation for mechanical and pharmacological prophylaxis. Risks include 59 year old female, BMI > 25, length of surgery, hx of cancer. Denies personal or family history of blood clots.  Rx sent to pharmacy for Keflex, Zofran, Valium, and Norco.  The 21st Century Cures Act was signed into law in 2016 which includes the topic of electronic health records.  This provides immediate access to information in MyChart.  This includes consultation notes, operative notes, office notes, lab results and pathology reports.  If you have any questions about what you read please let us know at your next visit or call us at the office.  We are right here with you.   Electronically signed by: Threasa Heads, PA-C 09/15/2019 1:12 PM

## 2019-09-15 NOTE — Progress Notes (Signed)
ICD-10-CM   1. Lobular breast cancer, left [facility])  C50.912       Patient ID: Natasha Graham, female    DOB: 1961/04/13, 59 y.o.   MRN: [id number]   History of Present Illness: Natasha Graham is a 59 y.o.  female  with a history of left lobular cancer.  She presents for preoperative evaluation for upcoming procedure, left nipple sparing mastectomy with left axillary sentinel node biopsy with Dr. Donne Hazel and immediate left breast reconstruction with placement of tissue expanders and Flex HD with Dr. Marla Roe, scheduled for 09/27/2019.  In September 2015 the patient underwent a right mastectomy (skin and nipple sparing) with expander placement.  In December 2015 she had a right saline implant placed and left mastopexy for symmetry.  A Mentor smooth round high profile saline implant was placed with 600 cc of saline infused. She is very pleased with the results.  1 month ago she had a mammogram with irregularities which led to diagnosis of left lobular breast cancer.  Her cancer is in the vertical limb of the mastopexy scar at the [facility].  She is a non-smoker with hx of HTN.  The patient has had problems with anesthesia; nausea.  Past Medical History: Allergies: No Known Allergies  Current Medications:  Current Outpatient Medications:  .  Flaxseed Oil (LINSEED OIL) OIL, by Misc.(Non-Drug; Combo Route) route., Disp: , Rfl:  .  lisinopril-hydrochlorothiazide (PRINZIDE,ZESTORETIC) 20-25 MG per tablet, Take 1 tablet by mouth every evening. , Disp: , Rfl:  .  Multiple Vitamin (MULTIVITAMIN) capsule, Take by mouth., Disp: , Rfl:  .  Pediatric Multivitamins-Fl (MULTI VIT/FL PO), Take by mouth., Disp: , Rfl:  .  Turmeric (QC TUMERIC COMPLEX) 500 MG CAPS, Take by mouth., Disp: , Rfl:   Past Medical Problems: Past Medical History:  Diagnosis Date  . Cancer [facility])    breast cancer  . Hypertension   . PONV (postoperative nausea and vomiting)   . Scoliosis   . Wears glasses     Past  Surgical History: Past Surgical History:  Procedure Laterality Date  . AUGMENTATION MAMMAPLASTY Right    2015 post mastectomy  . BREAST RECONSTRUCTION WITH PLACEMENT OF TISSUE EXPANDER AND FLEX HD (ACELLULAR HYDRATED DERMIS) Right 05/16/2014   Procedure: IMMEDIATE RIGHT BREAST RECONSTRUCTION WITH PLACEMENT OF TISSUE EXPANDER AND FLEX HD (ACELLULAR HYDRATED DERMIS);  Surgeon: Theodoro Kos, DO;  Location: [place];  Service: Plastics;  Laterality: Right;  . BREAST SURGERY     right breast excisional biopsy  . DILATION AND CURETTAGE OF UTERUS    . HYSTEROSCOPY WITH D & C N/A 09/20/2015   Procedure: DILATATION AND CURETTAGE /HYSTEROSCOPY;  Surgeon: Brien Few, MD;  Location: [place] [facility];  Service: Gynecology;  Laterality: N/A;  . LIPOSUCTION Bilateral 08/08/2014   Procedure: LIPO SUCTION ;  Surgeon: Theodoro Kos, DO;  Location: [place];  Service: Plastics;  Laterality: Bilateral;  . MASTECTOMY Right 05/16/2014   placement of acellular dermal matrix & tissue expanders   . MASTOPEXY Left 08/08/2014   Procedure:  MASTOPEXY FOR SYMMETRY;  Surgeon: Theodoro Kos, DO;  Location: [place];  Service: Plastics;  Laterality: Left;  . REDUCTION MAMMAPLASTY Left    2015  . REMOVAL OF TISSUE EXPANDER AND PLACEMENT OF IMPLANT Right 08/08/2014   Procedure: REMOVAL OF RIGHT  TISSUE EXPANDERS WITH PLACEMENT OF RIGHT BREAST IMPLANTS WITH LIPO SUCTION ;  Surgeon: Theodoro Kos, DO;  Location: [place];  Service: Plastics;  Laterality: Right;  . scoliosis  1972   harrington rods-age 25  . TONSILLECTOMY      Social History: Social History   Socioeconomic History  . Marital status: Married    Spouse name: Not on file  . Number of children: Not on file  . Years of education: Not on file  . Highest education level: Not on file  Occupational History  . Not on file  Tobacco Use  . Smoking status: Never Smoker  . Smokeless tobacco: Never Used  Substance and  Sexual Activity  . Alcohol use: No  . Drug use: No  . Sexual activity: Not on file  Other Topics Concern  . Not on file  Social History Narrative  . Not on file   Social Determinants of Health   Financial Resource Strain:   . Difficulty of Paying Living Expenses: Not on file  Food Insecurity:   . Worried About Charity fundraiser in the Last Year: Not on file  . Ran Out of Food in the Last Year: Not on file  Transportation Needs:   . Lack of Transportation (Medical): Not on file  . Lack of Transportation (Non-Medical): Not on file  Physical Activity:   . Days of Exercise per Week: Not on file  . Minutes of Exercise per Session: Not on file  Stress:   . Feeling of Stress : Not on file  Social Connections:   . Frequency of Communication with Friends and Family: Not on file  . Frequency of Social Gatherings with Friends and Family: Not on file  . Attends Religious Services: Not on file  . Active Member of Clubs or Organizations: Not on file  . Attends Archivist Meetings: Not on file  . Marital Status: Not on file  Intimate Partner Violence:   . Fear of Current or Ex-Partner: Not on file  . Emotionally Abused: Not on file  . Physically Abused: Not on file  . Sexually Abused: Not on file    Family History: Family History  Problem Relation Age of Onset  . Cancer Father        liver  . Breast cancer Paternal Aunt     Review of Systems: Review of Systems  Constitutional: Negative for chills and fever.  HENT: Negative for congestion and sore throat.   Respiratory: Negative for cough and shortness of breath.   Cardiovascular: Negative for chest pain.  Gastrointestinal: Negative for abdominal pain, nausea and vomiting.  Musculoskeletal: Negative for joint pain and myalgias.  Skin: Negative for itching and rash.    Physical Exam: Vital Signs BP (!) 150/86 (BP Location: Left Arm, Patient Position: Sitting, Cuff Size: Large)   Pulse 73   Temp 97.8 F (36.6  C) (Temporal)   Ht 5\' 6"  (1.676 m)   Wt 172 lb 9.6 oz (78.3 kg)   SpO2 100%   BMI 27.86 kg/m  Physical Exam Constitutional:      Appearance: Normal appearance. She is normal weight.  HENT:     Head: Normocephalic and atraumatic.  Eyes:     Extraocular Movements: Extraocular movements intact.  Cardiovascular:     Rate and Rhythm: Normal rate and regular rhythm.     Pulses: Normal pulses.     Heart sounds: Normal heart sounds.  Pulmonary:     Effort: Pulmonary effort is normal.     Breath sounds: Normal breath sounds. No wheezing, rhonchi or rales.  Abdominal:     General: Bowel sounds are normal.  Palpations: Abdomen is soft.  Musculoskeletal:        General: No swelling. Normal range of motion.     Cervical back: Normal range of motion.  Skin:    General: Skin is warm and dry.     Coloration: Skin is not pale.     Findings: No erythema or rash.  Neurological:     General: No focal deficit present.     Mental Status: She is alert and oriented to person, place, and time.  Psychiatric:        Mood and Affect: Mood normal.        Behavior: Behavior normal.        Thought Content: Thought content normal.        Judgment: Judgment normal.    Assessment/Plan:  Natasha Graham is here today for pre-op evaluation due to left lobular breast cancer. In 2015 she had right breast cancer and underwent a nipple sparing mastectomy. She also had right side reconstruction with a 600 cc saline implant and a left side mastopexy for symmetry.  She is very happy with the reconstruction on her right side and would like to achieve similar results on the left side.  She is a non-smoker with a hx of HTN.  She is scheduled for left nipple sparing mastectomy with left axillary sentinel node biopsy with Dr. Donne Hazel and immediate left breast reconstruction with placement of tissue expanders and Flex HD with Dr. Marla Roe, scheduled for 09/27/2019.  Risks, benefits, and alternatives of procedure  discussed, questions answered and consent obtained.    Caprini score: High. Recommendation for mechanical and pharmacological prophylaxis. Risks include 59 year old female, BMI > 25, length of surgery, hx of cancer. Denies personal or family history of blood clots.  Rx sent to pharmacy for Keflex, Zofran, Valium, and Norco.  The 21st Century Cures Act was signed into law in 2016 which includes the topic of electronic health records.  This provides immediate access to information in MyChart.  This includes consultation notes, operative notes, office notes, lab results and pathology reports.  If you have any questions about what you read please let us know at your next visit or call us at the office.  We are right here with you.   Electronically signed by: Threasa Heads, PA-C 09/15/2019 1:12 PM

## 2019-09-20 ENCOUNTER — Other Ambulatory Visit: Payer: Self-pay

## 2019-09-20 ENCOUNTER — Encounter (HOSPITAL_BASED_OUTPATIENT_CLINIC_OR_DEPARTMENT_OTHER): Payer: Self-pay | Admitting: General Surgery

## 2019-09-23 ENCOUNTER — Other Ambulatory Visit (HOSPITAL_COMMUNITY)
Admission: RE | Admit: 2019-09-23 | Discharge: 2019-09-23 | Disposition: A | Discharge status: Home / Self Care | Payer: BC Managed Care – PPO | Source: Ambulatory Visit | Attending: General Surgery | Admitting: General Surgery

## 2019-09-23 DIAGNOSIS — Z01812 Encounter for preprocedural laboratory examination: Secondary | ICD-10-CM | POA: Diagnosis not present

## 2019-09-23 DIAGNOSIS — Z20822 Contact with and (suspected) exposure to covid-19: Secondary | ICD-10-CM | POA: Diagnosis not present

## 2019-09-23 LAB — SARS CORONAVIRUS 2 (TAT 6-24 HRS): SARS Coronavirus 2: NEGATIVE

## 2019-09-25 ENCOUNTER — Encounter (HOSPITAL_BASED_OUTPATIENT_CLINIC_OR_DEPARTMENT_OTHER)
Admission: RE | Admit: 2019-09-25 | Discharge: 2019-09-25 | Disposition: A | Discharge status: Home / Self Care | Payer: BC Managed Care – PPO | Source: Ambulatory Visit | Attending: General Surgery | Admitting: General Surgery

## 2019-09-25 ENCOUNTER — Other Ambulatory Visit: Payer: Self-pay

## 2019-09-25 DIAGNOSIS — Z01818 Encounter for other preprocedural examination: Secondary | ICD-10-CM | POA: Diagnosis not present

## 2019-09-25 LAB — BASIC METABOLIC PANEL
Anion gap: 10 (ref 5–15)
BUN: 9 mg/dL (ref 6–20)
CO2: 28 mmol/L (ref 22–32)
Calcium: 9.2 mg/dL (ref 8.9–10.3)
Chloride: 102 mmol/L (ref 98–111)
Creatinine, Ser: 0.76 mg/dL (ref 0.44–1.00)
GFR calc Af Amer: >60 mL/min (ref >60)
GFR calc non Af Amer: >60 mL/min (ref >60)
Glucose, Bld: 114 mg/dL — ABNORMAL HIGH (ref 70–99)
Potassium: 3.9 mmol/L (ref 3.5–5.1)
Sodium: 140 mmol/L (ref 135–145)

## 2019-09-25 MED ORDER — ENSURE PRE-SURGERY PO LIQD: 296.0000 mL | Freq: Once | ORAL | Status: DC

## 2019-09-25 NOTE — Progress Notes (Signed)

## 2019-09-27 ENCOUNTER — Encounter (HOSPITAL_BASED_OUTPATIENT_CLINIC_OR_DEPARTMENT_OTHER): Payer: Self-pay | Admitting: General Surgery

## 2019-09-27 ENCOUNTER — Ambulatory Visit (HOSPITAL_BASED_OUTPATIENT_CLINIC_OR_DEPARTMENT_OTHER): Payer: BC Managed Care – PPO | Admitting: Certified Registered"

## 2019-09-27 ENCOUNTER — Encounter (HOSPITAL_BASED_OUTPATIENT_CLINIC_OR_DEPARTMENT_OTHER)
Admission: RE | Disposition: A | Discharge status: Home / Self Care | Payer: Self-pay | Source: Home / Self Care | Attending: General Surgery

## 2019-09-27 ENCOUNTER — Ambulatory Visit (HOSPITAL_COMMUNITY)
Admission: RE | Admit: 2019-09-27 | Discharge: 2019-09-27 | Disposition: A | Discharge status: Home / Self Care | Payer: BC Managed Care – PPO | Source: Ambulatory Visit | Attending: General Surgery | Admitting: General Surgery

## 2019-09-27 ENCOUNTER — Observation Stay (HOSPITAL_BASED_OUTPATIENT_CLINIC_OR_DEPARTMENT_OTHER)
Admission: RE | Admit: 2019-09-27 | Discharge: 2019-09-28 | Disposition: A | Discharge status: Home / Self Care | Payer: BC Managed Care – PPO | Attending: General Surgery | Admitting: General Surgery

## 2019-09-27 ENCOUNTER — Other Ambulatory Visit: Payer: Self-pay

## 2019-09-27 DIAGNOSIS — C773 Secondary and unspecified malignant neoplasm of axilla and upper limb lymph nodes: Secondary | ICD-10-CM | POA: Diagnosis not present

## 2019-09-27 DIAGNOSIS — Z170 Estrogen receptor positive status [ER+]: Secondary | ICD-10-CM | POA: Insufficient documentation

## 2019-09-27 DIAGNOSIS — I10 Essential (primary) hypertension: Secondary | ICD-10-CM | POA: Diagnosis not present

## 2019-09-27 DIAGNOSIS — C50912 Malignant neoplasm of unspecified site of left female breast: Principal | ICD-10-CM | POA: Diagnosis present

## 2019-09-27 DIAGNOSIS — G8918 Other acute postprocedural pain: Secondary | ICD-10-CM | POA: Diagnosis not present

## 2019-09-27 DIAGNOSIS — Z79899 Other long term (current) drug therapy: Secondary | ICD-10-CM | POA: Insufficient documentation

## 2019-09-27 DIAGNOSIS — Z9011 Acquired absence of right breast and nipple: Secondary | ICD-10-CM | POA: Diagnosis not present

## 2019-09-27 HISTORY — PX: BREAST RECONSTRUCTION WITH PLACEMENT OF TISSUE EXPANDER AND FLEX HD (ACELLULAR HYDRATED DERMIS): SHX6295

## 2019-09-27 HISTORY — PX: NIPPLE SPARING MASTECTOMY WITH SENTINEL LYMPH NODE BIOPSY: SHX6826

## 2019-09-27 SURGERY — NIPPLE SPARING MASTECTOMY WITH SENTINEL LYMPH NODE BIOPSY
Anesthesia: General | Site: Breast | Laterality: Left

## 2019-09-27 MED ORDER — CEFAZOLIN SODIUM-DEXTROSE 2-4 GM/100ML-% IV SOLN
2.0000 g | INTRAVENOUS | Status: AC
  Administered 2019-09-27: 2 g via INTRAVENOUS

## 2019-09-27 MED ORDER — SODIUM CHLORIDE (PF) 0.9 % IJ SOLN
INTRAMUSCULAR | Status: AC
  Filled 2019-09-27: qty 10

## 2019-09-27 MED ORDER — CEFAZOLIN SODIUM-DEXTROSE 2-4 GM/100ML-% IV SOLN: 2.0000 g | INTRAVENOUS | Status: DC

## 2019-09-27 MED ORDER — CEFAZOLIN SODIUM-DEXTROSE 2-4 GM/100ML-% IV SOLN
INTRAVENOUS | Status: AC
  Filled 2019-09-27: qty 100

## 2019-09-27 MED ORDER — SUCCINYLCHOLINE CHLORIDE 200 MG/10ML IV SOSY
PREFILLED_SYRINGE | INTRAVENOUS | Status: AC
  Filled 2019-09-27: qty 10

## 2019-09-27 MED ORDER — ROCURONIUM BROMIDE 100 MG/10ML IV SOLN
INTRAVENOUS | Status: DC | PRN
  Administered 2019-09-27: 50 mg via INTRAVENOUS

## 2019-09-27 MED ORDER — GABAPENTIN 100 MG PO CAPS
ORAL_CAPSULE | ORAL | Status: AC
  Filled 2019-09-27: qty 1

## 2019-09-27 MED ORDER — MIDAZOLAM HCL 2 MG/2ML IJ SOLN
INTRAMUSCULAR | Status: AC
  Filled 2019-09-27: qty 2

## 2019-09-27 MED ORDER — DIPHENHYDRAMINE HCL 50 MG/ML IJ SOLN
INTRAMUSCULAR | Status: DC | PRN
  Administered 2019-09-27: 6.25 mg via INTRAVENOUS

## 2019-09-27 MED ORDER — HYDROMORPHONE HCL 1 MG/ML IJ SOLN
INTRAMUSCULAR | Status: AC
  Filled 2019-09-27: qty 0.5

## 2019-09-27 MED ORDER — HYDROMORPHONE HCL 1 MG/ML IJ SOLN
0.2500 mg | INTRAMUSCULAR | Status: DC | PRN
  Administered 2019-09-27 (×2): 0.5 mg via INTRAVENOUS

## 2019-09-27 MED ORDER — SIMETHICONE 80 MG PO CHEW: 40.0000 mg | CHEWABLE_TABLET | Freq: Four times a day (QID) | ORAL | Status: DC | PRN

## 2019-09-27 MED ORDER — ONDANSETRON HCL 4 MG/2ML IJ SOLN
INTRAMUSCULAR | Status: DC | PRN
  Administered 2019-09-27: 4 mg via INTRAVENOUS

## 2019-09-27 MED ORDER — FENTANYL CITRATE (PF) 100 MCG/2ML IJ SOLN
INTRAMUSCULAR | Status: DC | PRN
  Administered 2019-09-27 (×2): 50 ug via INTRAVENOUS

## 2019-09-27 MED ORDER — DEXAMETHASONE SODIUM PHOSPHATE 4 MG/ML IJ SOLN
INTRAMUSCULAR | Status: DC | PRN
  Administered 2019-09-27: 5 mg via INTRAVENOUS

## 2019-09-27 MED ORDER — ONDANSETRON HCL 4 MG/2ML IJ SOLN
4.0000 mg | Freq: Four times a day (QID) | INTRAMUSCULAR | Status: DC | PRN
  Administered 2019-09-27: 4 mg via INTRAVENOUS
  Filled 2019-09-27: qty 2

## 2019-09-27 MED ORDER — SODIUM CHLORIDE 0.9 % IV SOLN
INTRAVENOUS | Status: DC | PRN
  Administered 2019-09-27: 500 mL

## 2019-09-27 MED ORDER — PROPOFOL 500 MG/50ML IV EMUL
INTRAVENOUS | Status: AC
  Filled 2019-09-27: qty 50

## 2019-09-27 MED ORDER — SUGAMMADEX SODIUM 200 MG/2ML IV SOLN
INTRAVENOUS | Status: DC | PRN
  Administered 2019-09-27: 200 mg via INTRAVENOUS

## 2019-09-27 MED ORDER — 0.9 % SODIUM CHLORIDE (POUR BTL) OPTIME
TOPICAL | Status: DC | PRN
  Administered 2019-09-27: 1000 mL

## 2019-09-27 MED ORDER — LACTATED RINGERS IV SOLN: INTRAVENOUS | Status: DC

## 2019-09-27 MED ORDER — PROPOFOL 10 MG/ML IV BOLUS
INTRAVENOUS | Status: DC | PRN
  Administered 2019-09-27: 150 mg via INTRAVENOUS

## 2019-09-27 MED ORDER — SUGAMMADEX SODIUM 500 MG/5ML IV SOLN
INTRAVENOUS | Status: AC
  Filled 2019-09-27: qty 5

## 2019-09-27 MED ORDER — METHOCARBAMOL 500 MG PO TABS
500.0000 mg | ORAL_TABLET | Freq: Three times a day (TID) | ORAL | Status: DC
  Administered 2019-09-27 (×2): 500 mg via ORAL
  Filled 2019-09-27 (×3): qty 1

## 2019-09-27 MED ORDER — LISINOPRIL-HYDROCHLOROTHIAZIDE 20-25 MG PO TABS
1.0000 | ORAL_TABLET | Freq: Every evening | ORAL | Status: DC
  Administered 2019-09-27: 1 via ORAL

## 2019-09-27 MED ORDER — EPHEDRINE SULFATE 50 MG/ML IJ SOLN
INTRAMUSCULAR | Status: DC | PRN
  Administered 2019-09-27: 10 mg via INTRAVENOUS

## 2019-09-27 MED ORDER — METHYLENE BLUE 0.5 % INJ SOLN
INTRAVENOUS | Status: AC
  Filled 2019-09-27: qty 10

## 2019-09-27 MED ORDER — FENTANYL CITRATE (PF) 100 MCG/2ML IJ SOLN
100.0000 ug | Freq: Once | INTRAMUSCULAR | Status: AC
  Administered 2019-09-27: 100 ug via INTRAVENOUS

## 2019-09-27 MED ORDER — FENTANYL CITRATE (PF) 100 MCG/2ML IJ SOLN
INTRAMUSCULAR | Status: AC
  Filled 2019-09-27: qty 2

## 2019-09-27 MED ORDER — TECHNETIUM TC 99M SULFUR COLLOID FILTERED
1.0000 | Freq: Once | INTRAVENOUS | Status: AC | PRN
  Administered 2019-09-27: 1 via INTRADERMAL

## 2019-09-27 MED ORDER — OXYCODONE HCL 5 MG PO TABS
5.0000 mg | ORAL_TABLET | ORAL | Status: DC | PRN
  Administered 2019-09-27 (×2): 5 mg via ORAL
  Filled 2019-09-27 (×2): qty 1

## 2019-09-27 MED ORDER — EPHEDRINE 5 MG/ML INJ
INTRAVENOUS | Status: AC
  Filled 2019-09-27: qty 10

## 2019-09-27 MED ORDER — BUPIVACAINE LIPOSOME 1.3 % IJ SUSP
INTRAMUSCULAR | Status: DC | PRN
  Administered 2019-09-27: 10 mL

## 2019-09-27 MED ORDER — GABAPENTIN 100 MG PO CAPS
100.0000 mg | ORAL_CAPSULE | ORAL | Status: AC
  Administered 2019-09-27: 100 mg via ORAL

## 2019-09-27 MED ORDER — SODIUM CHLORIDE 0.9 % IV SOLN
INTRAVENOUS | Status: AC
  Filled 2019-09-27: qty 500000

## 2019-09-27 MED ORDER — LIDOCAINE 2% (20 MG/ML) 5 ML SYRINGE
INTRAMUSCULAR | Status: AC
  Filled 2019-09-27: qty 5

## 2019-09-27 MED ORDER — ONDANSETRON 4 MG PO TBDP: 4.0000 mg | ORAL_TABLET | Freq: Four times a day (QID) | ORAL | Status: DC | PRN

## 2019-09-27 MED ORDER — MIDAZOLAM HCL 2 MG/2ML IJ SOLN
2.0000 mg | Freq: Once | INTRAMUSCULAR | Status: AC
  Administered 2019-09-27: 2 mg via INTRAVENOUS

## 2019-09-27 MED ORDER — BUPIVACAINE HCL (PF) 0.25 % IJ SOLN
INTRAMUSCULAR | Status: AC
  Filled 2019-09-27: qty 30

## 2019-09-27 MED ORDER — ACETAMINOPHEN 500 MG PO TABS
ORAL_TABLET | ORAL | Status: AC
  Filled 2019-09-27: qty 2

## 2019-09-27 MED ORDER — BUPIVACAINE-EPINEPHRINE (PF) 0.5% -1:200000 IJ SOLN
INTRAMUSCULAR | Status: DC | PRN
  Administered 2019-09-27: 20 mL

## 2019-09-27 MED ORDER — MORPHINE SULFATE (PF) 4 MG/ML IV SOLN: 2.0000 mg | INTRAVENOUS | Status: DC | PRN

## 2019-09-27 MED ORDER — PHENYLEPHRINE 40 MCG/ML (10ML) SYRINGE FOR IV PUSH (FOR BLOOD PRESSURE SUPPORT)
PREFILLED_SYRINGE | INTRAVENOUS | Status: AC
  Filled 2019-09-27: qty 10

## 2019-09-27 MED ORDER — ACETAMINOPHEN 500 MG PO TABS
1000.0000 mg | ORAL_TABLET | ORAL | Status: AC
  Administered 2019-09-27: 1000 mg via ORAL

## 2019-09-27 MED ORDER — ROCURONIUM BROMIDE 10 MG/ML (PF) SYRINGE
PREFILLED_SYRINGE | INTRAVENOUS | Status: AC
  Filled 2019-09-27: qty 10

## 2019-09-27 MED ORDER — DEXAMETHASONE SODIUM PHOSPHATE 10 MG/ML IJ SOLN
INTRAMUSCULAR | Status: AC
  Filled 2019-09-27: qty 1

## 2019-09-27 MED ORDER — DIPHENHYDRAMINE HCL 50 MG/ML IJ SOLN
INTRAMUSCULAR | Status: AC
  Filled 2019-09-27: qty 1

## 2019-09-27 MED ORDER — ONDANSETRON HCL 4 MG/2ML IJ SOLN
INTRAMUSCULAR | Status: AC
  Filled 2019-09-27: qty 2

## 2019-09-27 MED ORDER — SODIUM CHLORIDE 0.9 % IV SOLN: INTRAVENOUS | Status: DC

## 2019-09-27 MED ORDER — ACETAMINOPHEN 500 MG PO TABS
1000.0000 mg | ORAL_TABLET | Freq: Four times a day (QID) | ORAL | Status: DC
  Administered 2019-09-27 – 2019-09-28 (×3): 1000 mg via ORAL
  Filled 2019-09-27 (×3): qty 2

## 2019-09-27 MED ORDER — SCOPOLAMINE 1 MG/3DAYS TD PT72
MEDICATED_PATCH | TRANSDERMAL | Status: AC
  Filled 2019-09-27: qty 1

## 2019-09-27 MED ORDER — SCOPOLAMINE 1 MG/3DAYS TD PT72
1.0000 | MEDICATED_PATCH | TRANSDERMAL | Status: DC
  Administered 2019-09-27: 1.5 mg via TRANSDERMAL

## 2019-09-27 MED ORDER — CHLORHEXIDINE GLUCONATE CLOTH 2 % EX PADS: 6.0000 | MEDICATED_PAD | Freq: Once | CUTANEOUS | Status: DC

## 2019-09-27 SURGICAL SUPPLY — 101 items
APPLIER CLIP 11 MED OPEN (CLIP) ×2
APPLIER CLIP 9.375 MED OPEN (MISCELLANEOUS)
BAG DECANTER FOR FLEXI CONT (MISCELLANEOUS) ×2 IMPLANT
BENZOIN TINCTURE PRP APPL 2/3 (GAUZE/BANDAGES/DRESSINGS) IMPLANT
BINDER BREAST LRG (GAUZE/BANDAGES/DRESSINGS) IMPLANT
BINDER BREAST MEDIUM (GAUZE/BANDAGES/DRESSINGS) IMPLANT
BINDER BREAST XLRG (GAUZE/BANDAGES/DRESSINGS) ×2 IMPLANT
BINDER BREAST XXLRG (GAUZE/BANDAGES/DRESSINGS) IMPLANT
BIOPATCH RED 1 DISK 7.0 (GAUZE/BANDAGES/DRESSINGS) ×2 IMPLANT
BLADE CLIPPER SURG (BLADE) IMPLANT
BLADE HEX COATED 2.75 (ELECTRODE) IMPLANT
BLADE SURG 10 STRL SS (BLADE) ×2 IMPLANT
BLADE SURG 15 STRL LF DISP TIS (BLADE) ×1 IMPLANT
BLADE SURG 15 STRL SS (BLADE) ×1
BNDG GAUZE ELAST 4 BULKY (GAUZE/BANDAGES/DRESSINGS) IMPLANT
CANISTER SUCT 1200ML W/VALVE (MISCELLANEOUS) ×2 IMPLANT
CHLORAPREP W/TINT 26 (MISCELLANEOUS) ×4 IMPLANT
CLIP APPLIE 11 MED OPEN (CLIP) ×1 IMPLANT
CLIP APPLIE 9.375 MED OPEN (MISCELLANEOUS) IMPLANT
CLIP VESOCCLUDE SM WIDE 6/CT (CLIP) IMPLANT
COVER BACK TABLE 60X90IN (DRAPES) ×2 IMPLANT
COVER MAYO STAND STRL (DRAPES) ×2 IMPLANT
COVER PROBE W GEL 5X96 (DRAPES) ×2 IMPLANT
COVER WAND RF STERILE (DRAPES) IMPLANT
DECANTER SPIKE VIAL GLASS SM (MISCELLANEOUS) IMPLANT
DERMABOND ADVANCED (GAUZE/BANDAGES/DRESSINGS) ×1
DERMABOND ADVANCED .7 DNX12 (GAUZE/BANDAGES/DRESSINGS) ×1 IMPLANT
DRAIN CHANNEL 19F RND (DRAIN) ×2 IMPLANT
DRAPE LAPAROSCOPIC ABDOMINAL (DRAPES) IMPLANT
DRAPE TOP ARMCOVERS (MISCELLANEOUS) ×2 IMPLANT
DRAPE U-SHAPE 76X120 STRL (DRAPES) ×2 IMPLANT
DRAPE UTILITY XL STRL (DRAPES) ×2 IMPLANT
DRSG PAD ABDOMINAL 8X10 ST (GAUZE/BANDAGES/DRESSINGS) ×4 IMPLANT
DRSG TEGADERM 4X4.75 (GAUZE/BANDAGES/DRESSINGS) IMPLANT
ELECT BLADE 4.0 EZ CLEAN MEGAD (MISCELLANEOUS) ×2
ELECT REM PT RETURN 9FT ADLT (ELECTROSURGICAL) ×2
ELECTRODE BLDE 4.0 EZ CLN MEGD (MISCELLANEOUS) ×1 IMPLANT
ELECTRODE REM PT RTRN 9FT ADLT (ELECTROSURGICAL) ×1 IMPLANT
EVACUATOR SILICONE 100CC (DRAIN) ×2 IMPLANT
GAUZE SPONGE 4X4 12PLY STRL (GAUZE/BANDAGES/DRESSINGS) ×2 IMPLANT
GAUZE SPONGE 4X4 12PLY STRL LF (GAUZE/BANDAGES/DRESSINGS) IMPLANT
GLOVE BIO SURGEON STRL SZ 6.5 (GLOVE) ×6 IMPLANT
GLOVE BIO SURGEON STRL SZ7 (GLOVE) ×2 IMPLANT
GLOVE BIOGEL PI IND STRL 6.5 (GLOVE) ×1 IMPLANT
GLOVE BIOGEL PI IND STRL 7.0 (GLOVE) ×1 IMPLANT
GLOVE BIOGEL PI IND STRL 7.5 (GLOVE) ×2 IMPLANT
GLOVE BIOGEL PI INDICATOR 6.5 (GLOVE) ×1
GLOVE BIOGEL PI INDICATOR 7.0 (GLOVE) ×1
GLOVE BIOGEL PI INDICATOR 7.5 (GLOVE) ×2
GLOVE ECLIPSE 6.5 STRL STRAW (GLOVE) ×2 IMPLANT
GLOVE ECLIPSE 7.5 STRL STRAW (GLOVE) ×2 IMPLANT
GOWN STRL REUS W/ TWL LRG LVL3 (GOWN DISPOSABLE) ×5 IMPLANT
GOWN STRL REUS W/TWL LRG LVL3 (GOWN DISPOSABLE) ×5
GRAFT FLEX HD 6X16 PLIABLE (Tissue) ×2 IMPLANT
HEMOSTAT ARISTA ABSORB 3G PWDR (HEMOSTASIS) IMPLANT
IMPL EXPANDER BREAST 455CC (Breast) ×1 IMPLANT
IMPLANT BREAST 455CC (Breast) ×1 IMPLANT
IMPLANT EXPANDER BREAST 455CC (Breast) ×1 IMPLANT
IV NS 1000ML (IV SOLUTION)
IV NS 1000ML BAXH (IV SOLUTION) IMPLANT
IV NS 500ML (IV SOLUTION)
IV NS 500ML BAXH (IV SOLUTION) IMPLANT
KIT FILL SYSTEM UNIVERSAL (SET/KITS/TRAYS/PACK) IMPLANT
NDL SAFETY ECLIPSE 18X1.5 (NEEDLE) IMPLANT
NEEDLE HYPO 18GX1.5 SHARP (NEEDLE)
NEEDLE HYPO 25X1 1.5 SAFETY (NEEDLE) IMPLANT
NS IRRIG 1000ML POUR BTL (IV SOLUTION) ×2 IMPLANT
PACK BASIN DAY SURGERY FS (CUSTOM PROCEDURE TRAY) ×2 IMPLANT
PENCIL SMOKE EVACUATOR (MISCELLANEOUS) ×2 IMPLANT
PIN SAFETY STERILE (MISCELLANEOUS) ×2 IMPLANT
RETRACTOR ONETRAX LX 90X20 (MISCELLANEOUS) IMPLANT
SHEET MEDIUM DRAPE 40X70 STRL (DRAPES) ×2 IMPLANT
SLEEVE SCD COMPRESS KNEE MED (MISCELLANEOUS) ×2 IMPLANT
SPONGE LAP 18X18 RF (DISPOSABLE) ×6 IMPLANT
SPONGE LAP 4X18 RFD (DISPOSABLE) IMPLANT
STRIP CLOSURE SKIN 1/2X4 (GAUZE/BANDAGES/DRESSINGS) IMPLANT
STRIP SUTURE WOUND CLOSURE 1/2 (MISCELLANEOUS) IMPLANT
SUT ETHILON 2 0 FS 18 (SUTURE) IMPLANT
SUT MNCRL AB 4-0 PS2 18 (SUTURE) ×2 IMPLANT
SUT MON AB 3-0 SH 27 (SUTURE) ×1
SUT MON AB 3-0 SH27 (SUTURE) ×1 IMPLANT
SUT MON AB 5-0 PS2 18 (SUTURE) ×2 IMPLANT
SUT PDS 3-0 CT2 (SUTURE)
SUT PDS AB 2-0 CT2 27 (SUTURE) IMPLANT
SUT PDS II 3-0 CT2 27 ABS (SUTURE) IMPLANT
SUT SILK 2 0 SH (SUTURE) ×2 IMPLANT
SUT SILK 3 0 PS 1 (SUTURE) IMPLANT
SUT VIC AB 2-0 SH 27 (SUTURE)
SUT VIC AB 2-0 SH 27XBRD (SUTURE) IMPLANT
SUT VIC AB 3-0 54X BRD REEL (SUTURE) IMPLANT
SUT VIC AB 3-0 BRD 54 (SUTURE)
SUT VIC AB 3-0 SH 27 (SUTURE)
SUT VIC AB 3-0 SH 27X BRD (SUTURE) IMPLANT
SUT VICRYL 3-0 CR8 SH (SUTURE) ×2 IMPLANT
SYR BULB IRRIGATION 50ML (SYRINGE) ×2 IMPLANT
SYR CONTROL 10ML LL (SYRINGE) IMPLANT
TOWEL GREEN STERILE FF (TOWEL DISPOSABLE) ×4 IMPLANT
TRAY DSU PREP LF (CUSTOM PROCEDURE TRAY) IMPLANT
TUBE CONNECTING 20X1/4 (TUBING) ×2 IMPLANT
UNDERPAD 30X36 HEAVY ABSORB (UNDERPADS AND DIAPERS) ×4 IMPLANT
YANKAUER SUCT BULB TIP NO VENT (SUCTIONS) ×2 IMPLANT

## 2019-09-27 NOTE — Anesthesia Procedure Notes (Signed)
Anesthesia Regional Block: Pectoralis block   Pre-Anesthetic Checklist: ,, timeout performed, Correct Patient, Correct Site, Correct Laterality, Correct Procedure, Correct Position, site marked, Risks and benefits discussed, pre-op evaluation,  At surgeon's request and post-op pain management  Laterality: Left  Prep: Maximum Sterile Barrier Precautions used, chloraprep       Needles:  Injection technique: Single-shot  Needle Type: Echogenic Stimulator Needle     Needle Length: 9cm  Needle Gauge: 21     Additional Needles:   Procedures:,,,, ultrasound used (permanent image in chart),,,,  Narrative:  Start time: 09/27/2019 7:08 AM End time: 09/27/2019 7:18 AM Injection made incrementally with aspirations every 5 mL. Anesthesiologist: Roderic Palau, MD  Additional Notes: 2% Lidocaine skin wheel.

## 2019-09-27 NOTE — Anesthesia Postprocedure Evaluation (Signed)
Anesthesia Post Note  Patient: Natasha Graham  Procedure(s) Performed: LEFT NIPPLE SPARING MASTECTOMY WITH LEFT AXILLARY SENTINEL LYMPH NODE BIOPSY (Left Breast) LEFT BREAST RECONSTRUCTION WITH PLACEMENT OF TISSUE EXPANDER AND FLEX HD (ACELLULAR HYDRATED DERMIS) (Left Breast)     Patient location during evaluation: PACU Anesthesia Type: General and Regional Level of consciousness: awake and alert Pain management: pain level controlled Vital Signs Assessment: post-procedure vital signs reviewed and stable Respiratory status: spontaneous breathing, nonlabored ventilation and respiratory function stable Cardiovascular status: blood pressure returned to baseline and stable Postop Assessment: no apparent nausea or vomiting Anesthetic complications: no    Last Vitals:  Vitals:   09/27/19 1130 09/27/19 1200  BP: 124/74 139/72  Pulse: 79 82  Resp: 13 18  Temp:  (!) 36.1 C  SpO2: 100% 97%    Last Pain:  Vitals:   09/27/19 1200  TempSrc:   PainSc: 3                  Merridith Dershem,W. EDMOND

## 2019-09-27 NOTE — Discharge Instructions (Signed)
INSTRUCTIONS FOR AFTER BREAST SURGERY   You are getting ready to undergo breast surgery.  You will likely have some questions about what to expect following your operation.  The following information will help you and your family understand what to expect when you are discharged from the hospital.  Following these guidelines will help ensure a smooth recovery and reduce risks of complications.   Postoperative instructions include information on: diet, wound care, medications and physical activity.  AFTER SURGERY Expect to go home after the procedure.  In some cases, you may need to spend one night in the hospital for observation.  DIET Breast surgery does not require a specific diet.  However, the healthier you eat the better your body can start healing. It is important to increasing your protein intake.  This means limiting the foods with sugar and carbohydrates.  Focus on vegetables and some meat.  If you have any liposuction during your procedure be sure to drink water.  If your urine is bright yellow, then it is concentrated, and you need to drink more water.  As a general rule after surgery, you should have 8 ounces of water every hour while awake.  If you find you are persistently nauseated or unable to take in liquids let us know.  NO TOBACCO USE or EXPOSURE.  This will slow your healing process and increase the risk of a wound.  WOUND CARE If you have a drain: You can shower five days after surgery.  Clean with baby wipes until the drain is removed.    If you have steri-strips / tape directly attached to your skin leave them in place. It is OK to get these wet.  No baths, pools or hot tubs for two weeks. We close your incision to leave the smallest and best-looking scar. No ointment or creams on your incisions until given the go ahead.  Especially not Neosporin (Too many skin reactions with this one).  A few weeks after surgery you can use Mederma and start massaging the scar. We ask you to  wear your binder or sports bra for the first 6 weeks around the clock, including while sleeping. This provides added comfort and helps reduce the fluid accumulation at the surgery site.  ACTIVITY No heavy lifting until cleared by the doctor.  This usually means no more than a half-gallon of milk.  It is OK to walk and climb stairs. In fact, moving your legs is very important to decrease your risk of a blood clot.  It will also help keep you from getting deconditioned.  Every 1 to 2 hours get up and walk for 5 minutes. This will help with a quicker recovery back to normal.  Let pain be your guide so you don't do too much.  This is not the time for spring cleaning and don't plan on taking care of anyone else.  This time is for you to recover,  You will be more comfortable if you sleep and rest with your head elevated either with a few pillows under you or in a recliner.  No stomach sleeping for a three months.  WORK Everyone returns to work at different times. As a rough guide, most people take at least 1 - 2 weeks off prior to returning to work. If you need documentation for your job, bring the forms to your postoperative follow up visit.  DRIVING Arrange for someone to bring you home from the hospital.  You may be able to drive a  few days after surgery but not while taking any narcotics or valium.  BOWEL MOVEMENTS Constipation can occur after anesthesia and while taking pain medication.  It is important to stay ahead for your comfort.  We recommend taking Milk of Magnesia (2 tablespoons; twice a day) while taking the pain pills.  SEROMA This is fluid your body tried to put in the surgical site.  This is normal but if it creates tight skinny skin let us know.  It usually decreases in a few weeks.  MEDICATIONS and PAIN CONTROL At your preoperative visit for you history and physical you were given the following medications: 1. An antibiotic: Start this medication when you get home and take according  to the instructions on the bottle. 2. Zofran 4 mg:  This is to treat nausea and vomiting.  You can take this every 6 hours as needed and only if needed. 3. Valium 2 mg: This is for muscle tightness if you have an implant or expander. This will help relax your muscle which also helps with pain control.  This can be taken every 12 hours as needed.  Don't drive after taking this medication. 4. Norco (hydrocodone/acetaminophen) 5/325 mg:  This is only to be used after you have taken the motrin or the tylenol. Every 8 hours as needed. Over the counter Medication to take: 5. Ibuprofen (Motrin) 600 mg:  Take this every 6 hours.  If you have additional pain then take 500 mg of the tylenol every 8 hours.  Only take the Norco after you have tried these two. 6. Miralax or stool softener of choice: Take this according to the bottle if you take the [place] Call your surgeon's office if any of the following occur:  Fever 101 degrees F or greater  Excessive bleeding or fluid from the incision site.  Pain that increases over time without aid from the medications  Redness, warmth, or pus draining from incision sites  Persistent nausea or inability to take in liquids  Severe misshapen area that underwent the operation.  Here are some resources:  1. Plastic surgery website: https://www.plasticsurgery.org/for-medical-professionals/education-and-resources/publications/breast-reconstruction-magazine 2. Breast Reconstruction Awareness Campaign:  HotelLives.co.nz 3. Plastic surgery Implant information:  https://www.plasticsurgery.org/patient-safety/breast-implant-safety  About my Jackson-Pratt Bulb Drain  What is a Jackson-Pratt bulb? A Jackson-Pratt is a soft, round device used to collect drainage. It is connected to a long, thin drainage catheter, which is held in place by one or two small stiches near your surgical incision site. When the bulb is squeezed, it forms a vacuum,  forcing the drainage to empty into the bulb.  Emptying the Jackson-Pratt bulb- To empty the bulb: 1. Release the plug on the top of the bulb. 2. Pour the bulb's contents into a measuring container which your nurse will provide. 3. Record the time emptied and amount of drainage. Empty the drain(s) as often as your     doctor or nurse recommends.  Date                  Time                    Amount (Drain 1)                 Amount (Drain 2)  _____________________________________________________________________  _____________________________________________________________________  _____________________________________________________________________  _____________________________________________________________________  _____________________________________________________________________  _____________________________________________________________________  _____________________________________________________________________  _____________________________________________________________________  Squeezing the Jackson-Pratt Bulb- To squeeze the bulb: 1. Make sure the plug at the top of the bulb is  open. 2. Squeeze the bulb tightly in your fist. You will hear air squeezing from the bulb. 3. Replace the plug while the bulb is squeezed. 4. Use a safety pin to attach the bulb to your clothing. This will keep the catheter from     pulling at the bulb insertion site.  When to call your doctor- Call your doctor if:  Drain site becomes red, swollen or hot.  You have a fever greater than 101 degrees F.  There is oozing at the drain site.  Drain falls out (apply a guaze bandage over the drain hole and secure it with tape).  Drainage increases daily not related to activity patterns. (You will usually have more drainage when you are active than when you are resting.)  Drainage has a bad odor.  Information for Discharge Teaching: EXPAREL (bupivacaine liposome injectable  suspension)   Your surgeon or anesthesiologist gave you EXPAREL(bupivacaine) to help control your pain after surgery.   EXPAREL is a local anesthetic that provides pain relief by numbing the tissue around the surgical site.  EXPAREL is designed to release pain medication over time and can control pain for up to 72 hours.  Depending on how you respond to EXPAREL, you may require less pain medication during your recovery.  Possible side effects:  Temporary loss of sensation or ability to move in the area where bupivacaine was injected.  Nausea, vomiting, constipation  Rarely, numbness and tingling in your mouth or lips, lightheadedness, or anxiety may occur.  Call your doctor right away if you think you may be experiencing any of these sensations, or if you have other questions regarding possible side effects.  Follow all other discharge instructions given to you by your surgeon or nurse. Eat a healthy diet and drink plenty of water or other fluids.  If you return to the hospital for any reason within 96 hours following the administration of EXPAREL, it is important for health care providers to know that you have received this anesthetic. A teal colored band has been placed on your arm with the date, time and amount of EXPAREL you have received in order to alert and inform your health care providers. Please leave this armband in place for the full 96 hours following administration, and then you may remove the band.

## 2019-09-27 NOTE — Interval H&P Note (Signed)
History and Physical Interval Note:  09/27/2019 7:44 AM  Natasha Graham  has presented today for surgery, with the diagnosis of LEFT BREAST CANCER.  The various methods of treatment have been discussed with the patient and family. After consideration of risks, benefits and other options for treatment, the patient has consented to  Procedure(s): LEFT NIPPLE SPARING MASTECTOMY WITH LEFT AXILLARY SENTINEL LYMPH NODE BIOPSY (Left) LEFT BREAST RECONSTRUCTION WITH PLACEMENT OF TISSUE EXPANDER AND FLEX HD (ACELLULAR HYDRATED DERMIS) (Left) as a surgical intervention.  The patient's history has been reviewed, patient examined, no change in status, stable for surgery.  I have reviewed the patient's chart and labs.  Questions were answered to the patient's satisfaction.     Loel Lofty Elyza Whitt

## 2019-09-27 NOTE — H&P (Signed)
Natasha Graham is an 59 y.o. female.   Chief Complaint: breast cancer HPI:  34 yof referred by Dr Natasha Graham for new left breast cancer. she has history of right nsm for er/pr pos dcis in 2015. she declined antiestrogen therapy at that time for risk reduction. she has done well since then. she has no mass or dc now. she completed recon and has been happy. she underwent screening mm that shows b density breasts. she has a 1.7 cm mass by mm and then a 2.1 cm mass by [place]. axillary [place] was negative. core biopsy is ILC grade I that is er pos, pr neg, Her 2 negative and Ki is 1%. she is here to discuss options   Past Medical History:  Diagnosis Date  . Cancer [facility])    breast cancer  . Hypertension   . PONV (postoperative nausea and vomiting)   . Scoliosis   . Wears glasses     Past Surgical History:  Procedure Laterality Date  . AUGMENTATION MAMMAPLASTY Right    2015 post mastectomy  . BREAST RECONSTRUCTION WITH PLACEMENT OF TISSUE EXPANDER AND FLEX HD (ACELLULAR HYDRATED DERMIS) Right 05/16/2014   Procedure: IMMEDIATE RIGHT BREAST RECONSTRUCTION WITH PLACEMENT OF TISSUE EXPANDER AND FLEX HD (ACELLULAR HYDRATED DERMIS);  Surgeon: Theodoro Kos, DO;  Location: [facility];  Service: Plastics;  Laterality: Right;  . BREAST SURGERY     right breast excisional biopsy  . DILATION AND CURETTAGE OF UTERUS    . HYSTEROSCOPY WITH D & C N/A 09/20/2015   Procedure: DILATATION AND CURETTAGE /HYSTEROSCOPY;  Surgeon: Brien Few, MD;  Location: [facility];  Service: Gynecology;  Laterality: N/A;  . LIPOSUCTION Bilateral 08/08/2014   Procedure: LIPO SUCTION ;  Surgeon: Theodoro Kos, DO;  Location: [facility];  Service: Plastics;  Laterality: Bilateral;  . MASTECTOMY Right 05/16/2014   placement of acellular dermal matrix & tissue expanders   . MASTOPEXY Left 08/08/2014   Procedure:  MASTOPEXY FOR SYMMETRY;  Surgeon: Theodoro Kos, DO;  Location: [facility];  Service: Plastics;   Laterality: Left;  . REDUCTION MAMMAPLASTY Left    2015  . REMOVAL OF TISSUE EXPANDER AND PLACEMENT OF IMPLANT Right 08/08/2014   Procedure: REMOVAL OF RIGHT  TISSUE EXPANDERS WITH PLACEMENT OF RIGHT BREAST IMPLANTS WITH LIPO SUCTION ;  Surgeon: Theodoro Kos, DO;  Location: [facility];  Service: Plastics;  Laterality: Right;  . scoliosis  1972   harrington rods-age 20  . TONSILLECTOMY      Family History  Problem Relation Age of Onset  . Cancer Father        liver  . Breast cancer Paternal Aunt    Social History:  reports that she has never smoked. She has never used smokeless tobacco. She reports that she does not drink alcohol or use drugs.  Allergies: No Known Allergies  Medications Prior to Admission  Medication Sig Dispense Refill  . Ascorbic Acid (VITAMIN C) 100 MG tablet Take 100 mg by mouth daily.    . Flaxseed Oil (LINSEED OIL) OIL by Misc.(Non-Drug; Combo Route) route.    . Ginger, Zingiber officinalis, (GINGER PO) Take by mouth.    Marland Kitchen lisinopril-hydrochlorothiazide (PRINZIDE,ZESTORETIC) 20-25 MG per tablet Take 1 tablet by mouth every evening.     . Multiple Vitamin (MULTIVITAMIN) capsule Take by mouth.    . Pediatric Multivitamins-Fl (MULTI VIT/FL PO) Take by mouth.    . Turmeric (QC TUMERIC COMPLEX) 500 MG CAPS Take by mouth.    Marland Kitchen  ondansetron (ZOFRAN) 4 MG tablet Take 1 tablet (4 mg total) by mouth every 8 (eight) hours as needed for nausea or vomiting. 20 tablet 0    Results for orders placed or performed during the hospital encounter of 09/27/19 (from the past 48 hour(s))  Basic metabolic panel     Status: Abnormal   Collection Time: 09/25/19  9:30 AM  Result Value Ref Range   Sodium 140 135 - 145 mmol/L   Potassium 3.9 3.5 - 5.1 mmol/L   Chloride 102 98 - 111 mmol/L   CO2 28 22 - 32 mmol/L   Glucose, Bld 114 (H) 70 - 99 mg/dL   BUN 9 6 - 20 mg/dL   Creatinine, Ser 0.76 0.44 - 1.00 mg/dL   Calcium 9.2 8.9 - 10.3 mg/dL   GFR calc non Af Amer >60  >60 mL/min   GFR calc Af Amer >60 >60 mL/min   Anion gap 10 5 - 15    Comment: Performed at [facility], [place] [address], [place], [place] [address]   NM Sentinel Node Inj-No Rpt (Breast)  Result Date: 09/27/2019 Sulfur colloid was injected by the nuclear medicine technologist for melanoma sentinel node.    Review of Systems  All other systems reviewed and are negative.   Blood pressure 132/62, pulse 88, temperature 97.7 F (36.5 C), temperature source Temporal, resp. rate 12, height 5\' 4"  (1.626 m), weight 77.6 kg, last menstrual period 10/16/2013, SpO2 100 %. Physical Exam  General Mental Status-Alert. Orientation-Oriented X3. Head and Neck Trachea-midline. cv rrr Lungs clearn Breast Nipples-No Discharge. Breast Lump-No Palpable Breast Mass. Note: right im incision well healed, implant in place mastopexy incision on left well healed with hematoma at central lower breast where mass appears to be    Assessment/Plan BREAST CARCINOMA, LOBULAR, LEFT (C50.912) Story: Left nsm with ax sn biopsy  we discussed staging and pathophysiology of breast cancer and all available treatment options. we discussed genetic testing and will refer next year due to insurance. we discussed sn biopsy to ensure no nodal involvement. we discussed risk of lymphedema and shoulder dysfunction. she understands this from prior surgery. she desires mastectomy as she has had one before and does not want to do radiation. i think this is reasonable. no need for mri at this point. we discussed nsm on this side and I will refer to plastic surgery      Rolm Bookbinder, MD 09/27/2019, 7:55 AM

## 2019-09-27 NOTE — Progress Notes (Signed)
Assisted Dr. Oren Bracket with left, ultrasound guided, pectoralis block. Side rails up, monitors on throughout procedure. See vital signs in flow sheet. Tolerated Procedure well.

## 2019-09-27 NOTE — Transfer of Care (Signed)
Immediate Anesthesia Transfer of Care Note  Patient: LOVENIA PAPE  Procedure(s) Performed: LEFT NIPPLE SPARING MASTECTOMY WITH LEFT AXILLARY SENTINEL LYMPH NODE BIOPSY (Left Breast) LEFT BREAST RECONSTRUCTION WITH PLACEMENT OF TISSUE EXPANDER AND FLEX HD (ACELLULAR HYDRATED DERMIS) (Left Breast)  Patient Location: PACU  Anesthesia Type:GA combined with regional for post-op pain  Level of Consciousness: awake, alert , oriented and drowsy  Airway & Oxygen Therapy: Patient Spontanous Breathing and Patient connected to face mask oxygen  Post-op Assessment: Report given to RN and Post -op Vital signs reviewed and stable  Post vital signs: Reviewed and stable  Last Vitals:  Vitals Value Taken Time  BP 132/73 09/27/19 1022  Temp    Pulse 94 09/27/19 1024  Resp 20 09/27/19 1024  SpO2 100 % 09/27/19 1024  Vitals shown include unvalidated device data.  Last Pain:  Vitals:   09/27/19 0646  TempSrc: Temporal  PainSc: 0-No pain         Complications: No apparent anesthesia complications

## 2019-09-27 NOTE — Anesthesia Procedure Notes (Signed)

## 2019-09-27 NOTE — Interval H&P Note (Signed)
History and Physical Interval Note:  09/27/2019 7:56 AM  Natasha Graham  has presented today for surgery, with the diagnosis of LEFT BREAST CANCER.  The various methods of treatment have been discussed with the patient and family. After consideration of risks, benefits and other options for treatment, the patient has consented to  Procedure(s): LEFT NIPPLE SPARING MASTECTOMY WITH LEFT AXILLARY SENTINEL LYMPH NODE BIOPSY (Left) LEFT BREAST RECONSTRUCTION WITH PLACEMENT OF TISSUE EXPANDER AND FLEX HD (ACELLULAR HYDRATED DERMIS) (Left) as a surgical intervention.  The patient's history has been reviewed, patient examined, no change in status, stable for surgery.  I have reviewed the patient's chart and labs.  Questions were answered to the patient's satisfaction.     Rolm Bookbinder

## 2019-09-27 NOTE — Anesthesia Preprocedure Evaluation (Addendum)
Anesthesia Evaluation  Patient identified by MRN, date of birth, ID band Patient awake    Reviewed: Allergy & Precautions, H&P , NPO status , Patient's Chart, lab work & pertinent test results  History of Anesthesia Complications (+) PONV  Airway Mallampati: III  TM Distance: >3 FB Neck ROM: Full    Dental no notable dental hx. (+) Teeth Intact, Dental Advisory Given   Pulmonary neg pulmonary ROS,    Pulmonary exam normal breath sounds clear to auscultation       Cardiovascular hypertension, Pt. on medications  Rhythm:Regular Rate:Normal     Neuro/Psych negative neurological ROS  negative psych ROS   GI/Hepatic negative GI ROS, Neg liver ROS,   Endo/Other  negative endocrine ROS  Renal/GU negative Renal ROS  negative genitourinary   Musculoskeletal   Abdominal   Peds  Hematology negative hematology ROS (+)   Anesthesia Other Findings   Reproductive/Obstetrics negative OB ROS                            Anesthesia Physical Anesthesia Plan  ASA: II  Anesthesia Plan: General   Post-op Pain Management:  Regional for Post-op pain   Induction: Intravenous  PONV Risk Score and Plan: 4 or greater and Ondansetron, Dexamethasone, Midazolam and Scopolamine patch - Pre-op  Airway Management Planned: Oral ETT  Additional Equipment:   Intra-op Plan:   Post-operative Plan: Extubation in OR  Informed Consent: I have reviewed the patients History and Physical, chart, labs and discussed the procedure including the risks, benefits and alternatives for the proposed anesthesia with the patient or authorized representative who has indicated his/her understanding and acceptance.     Dental advisory given  Plan Discussed with: CRNA  Anesthesia Plan Comments:         Anesthesia Quick Evaluation

## 2019-09-27 NOTE — Op Note (Addendum)
Op report    DATE OF OPERATION:  09/27/2019  LOCATION: [facility]  SURGICAL DIVISION: Plastic Surgery  PREOPERATIVE DIAGNOSES:  1. Left Breast cancer.    POSTOPERATIVE DIAGNOSES:  1. Left Breast cancer.   PROCEDURE:  1. Left immediate breast reconstruction with placement of Acellular Dermal Matrix and tissue expanders.  SURGEON: Adley Mazurowski Sanger Cleven Jansma, DO  ASSISTANT: Phoebe Sharps, PA  ANESTHESIA:  General.   COMPLICATIONS: None.   IMPLANTS: Left - Mentor 455 cc. Ref [id number].  Serial Number [id number], 170 cc of injectable saline placed in the expander. Acellular Dermal Matrix 6 x 16 cm Flex HD  INDICATIONS FOR PROCEDURE:  The patient, Natasha Graham, is a 59 y.o. female born on 1960/09/04, is here for  immediate first stage breast reconstruction with placement of left tissue expander and Acellular dermal matrix. MRN: [id number]  CONSENT:  Informed consent was obtained directly from the patient. Risks, benefits and alternatives were fully discussed. Specific risks including but not limited to bleeding, infection, hematoma, seroma, scarring, pain, implant infection, implant extrusion, capsular contracture, asymmetry, wound healing problems, and need for further surgery were all discussed. The patient did have an ample opportunity to have her questions answered to her satisfaction.   DESCRIPTION OF PROCEDURE:  The patient was taken to the operating room by the general surgery team. SCDs were placed and IV antibiotics were given. The patient's chest was prepped and draped in a sterile fashion. A time out was performed and the implants to be used were identified.  A left mastectomy was performed.  Once the general surgery team had completed their portion of the case the patient was rendered to the plastic and reconstructive surgery team.  Left:  Hemostasis was achieved with electrocautery.  The pocket was irriated with saline and antibiotic solution.  The pectoralis major muscle was lifted from the chest wall with release of the lateral edge and lateral inframammary fold.  The pocket was irrigated with antibiotic solution and hemostasis was achieved with electrocautery.  The lateral aspect of the pectoralis muscle was split and therefore left in place.  The ADM was then prepared according to the manufacture guidelines and slits placed to help with postoperative fluid management.  The ADM was then sutured to the inferior and lateral edge of the inframammary fold with 2-0 PDS starting with an interrupted stitch and then a running stitch.  The lateral portion was sutured to with interrupted sutures after the expander was placed.  The expander was prepared according to the manufacture guidelines, the air evacuated and then it was placed under the ADM and pectoralis major muscle.  The inferior and lateral tabs were used to secure the expander to the chest wall with 2-0 PDS.  The drain was placed at the inframammary fold over the ADM and secured to the skin with 3-0 Silk.    The deep layers were closed with 3-0 Monocryl followed by 4-0 Monocryl.  The skin was closed with 5-0 Monocryl and then dermabond was applied.  The ABDs and breast binder were placed.  The patient tolerated the procedure well and there were no complications.  The patient was allowed to wake from anesthesia and taken to the recovery room in satisfactory condition.   The advanced practice practitioner (APP) assisted throughout the case.  The APP was essential in retraction and counter traction when needed to make the case progress smoothly.  This retraction and assistance made it possible to see the tissue plans for the procedure.  The assistance was needed for blood control, tissue re-approximation and assisted with closure of the incision site.  The [place] was signed into law in 2016 which includes the topic of electronic health records.  This provides immediate access to  information in MyChart.  This includes consultation notes, operative notes, office notes, lab results and pathology reports.  If you have any questions about what you read please let us know at your next visit or call us at the office.  We are right here with you.

## 2019-09-27 NOTE — Op Note (Signed)
Preoperative diagnosis: clinical stage II left breast invasive lobular cancer Postoperative diagnosis: same as above Procedure: 1.  Left nipple sparing mastectomy 2.  Left deep axillary sentinel node biopsy Surgeon: Dr Serita Grammes EBL: 100 cc Drains per plastic surgery Specimens: 1. Left breast tissue short superior, long lateral, double nipple/areola 2. Left deep axillary nodes with highest count 751 Complications none Sponge and needle count correct dispo case turned over to plastic surgery  Indications: This is a 59 year old female I know from a history of a right nipple sparing mastectomy for hormone receptor positive DCIS in 2015.  She declined antiestrogen therapy at that time for risk reduction.  She has done well since then and presented with a screening mammogram that showed B density breast.  She had a 1.7 cm mass by mammography and then a 2.1 cm mass by ultrasound.  Her axillary ultrasound was negative.  Core biopsy showed a grade 1 invasive lobular cancer that was ER positive, PR negative, HER-2 negative.  She wanted to wait for insurance issues.  We elected to perform a left nipple sparing mastectomy with axillary sentinel node biopsy combined with expander reconstruction.  Procedure: After informed consent was obtained the patient first underwent a pectoral block.  She was given antibiotics.  SCDs were in place.  She was injected with technetium in the standard periareolar fashion.  She was then placed under general anesthesia without complication.  She was prepped and draped in the standard sterile surgical fashion.  A surgical timeout was then performed.  Her tumor was adherent to the vertical limb of her reduction scar.  I made an inframammary incision but I extended this over the mass to include any skin that might be involved.  I remove the skin paddle overlying the mass.  The remaining skin was free.  I then created the posterior flap and including the pectoralis fascia to  the clavicle, parasternal area, and latissimus.  I then created the anterior flap with a combination of cautery and facelift scissors to minimize the amount of breast tissue on the flap.  I used the same boundaries as the posterior flap.  She had some scar tissue related to her prior surgery but otherwise this was not difficult.  I then remove the breast tissue and marked it as above.  This was passed off the table.  I obtained hemostasis on my flaps.  The flaps were appropriately sized.  I then was able to enter into the axilla.  There were several sentinel lymph nodes that were identified that were not enlarged.  I excised these and there was no background radioactivity when I was done.  I obtained hemostasis.  I then turned the case over to plastic surgery for reconstruction.

## 2019-09-28 ENCOUNTER — Other Ambulatory Visit: Payer: Self-pay

## 2019-09-28 ENCOUNTER — Encounter: Payer: Self-pay | Admitting: *Deleted

## 2019-09-28 NOTE — Discharge Summary (Signed)
Physician Discharge Summary  Patient ID: Natasha Graham MRN: [id number] DOB/AGE: Oct 08, 1960 59 y.o.  Admit date: 09/27/2019 Discharge date: 09/28/2019  Admission Diagnoses: Breast cancer, left  Discharge Diagnoses:  Active Problems:   Breast cancer, left breast [facility])   Discharged Condition: good  Hospital Course: Natasha Graham underwent a left nipple sparing mastectomy with left axillary sentinel lymph node biopsy with Dr. Donne Hazel and left breast reconstruction with placement of tissue expander and Flex HD with Dr. Marla Roe yesterday. No overnight events. This morning she is doing very well. Incisions c/d/i. Drain in place with serosanguinous fluid in the bulb. Patient denies CP, SOB, HA, N/V. Reports pain is well controlled.   Consults: None  Significant Diagnostic Studies: none  Treatments: surgery:   Discharge Exam: Blood pressure (!) 109/49, pulse 60, temperature 97.9 F (36.6 C), resp. rate 16, height 5\' 4"  (1.626 m), weight 77.6 kg, last menstrual period 10/16/2013, SpO2 99 %. General appearance: alert, cooperative and no distress Head: Normocephalic, without obvious abnormality, atraumatic Eyes: EOMs intact Resp: normal effort Incision/Wound: Left Breast Incision c/d/i. No signs of infection, seroma/hematoma. Mild bruising lateral aspect. Drain in place with serosanguinous fluid in bulb.   Disposition: Discharge disposition: 01-Home or Self Care       Discharge Instructions    Call MD for:  difficulty breathing, headache or visual disturbances   Complete by: As directed    Call MD for:  extreme fatigue   Complete by: As directed    Call MD for:  hives   Complete by: As directed    Call MD for:  persistant dizziness or light-headedness   Complete by: As directed    Call MD for:  persistant nausea and vomiting   Complete by: As directed    Call MD for:  redness, tenderness, or signs of infection (pain, swelling, redness, odor or green/yellow discharge around  incision site)   Complete by: As directed    Call MD for:  severe uncontrolled pain   Complete by: As directed    Call MD for:  temperature >100.4   Complete by: As directed    Diet - low sodium heart healthy   Complete by: As directed    Increase activity slowly   Complete by: As directed      Allergies as of 09/28/2019   No Known Allergies     Medication List    TAKE these medications   GINGER PO Take by mouth.   Linseed Oil Oil by Misc.(Non-Drug; Combo Route) route.   lisinopril-hydrochlorothiazide 20-25 MG tablet Commonly known as: ZESTORETIC Take 1 tablet by mouth every evening.   MULTI VIT/FL PO Take by mouth.   multivitamin capsule Take by mouth.   ondansetron 4 MG tablet Commonly known as: Zofran Take 1 tablet (4 mg total) by mouth every 8 (eight) hours as needed for nausea or vomiting.   QC Tumeric Complex 500 MG Caps Generic drug: Turmeric Take by mouth.   vitamin C 100 MG tablet Take 100 mg by mouth daily.      Follow-up Information    Dillingham, Loel Lofty, DO In 1 week.   Specialty: Plastic Surgery Contact information: [address] [place] [address] [phone]        Rolm Bookbinder, MD In 2 weeks.   Specialty: General Surgery Contact information: [address] [place] [address] [phone]          Dr. Lyndee Leo [facility] Dillingham [address] [place],  [place]  [id number]  Signed: Threasa Heads 09/28/2019, 7:15 AM

## 2019-10-02 ENCOUNTER — Other Ambulatory Visit: Payer: Self-pay | Admitting: Oncology

## 2019-10-02 ENCOUNTER — Telehealth: Payer: Self-pay | Admitting: *Deleted

## 2019-10-02 DIAGNOSIS — C50912 Malignant neoplasm of unspecified site of left female breast: Secondary | ICD-10-CM

## 2019-10-02 NOTE — Telephone Encounter (Signed)
Ordered mammaprint per Dr. Magrinat. Faxed requisition to pathology and [facility] °

## 2019-10-05 ENCOUNTER — Telehealth: Payer: Self-pay

## 2019-10-05 ENCOUNTER — Telehealth: Payer: Self-pay | Admitting: Oncology

## 2019-10-05 NOTE — Progress Notes (Signed)
   Subjective:     Patient ID: Natasha Graham, female    DOB: 07-31-1961, 59 y.o.   MRN: [id number]  Chief Complaint  Patient presents with  . Post-op Follow-up    PO follow up visit from [place]: 09/27/19 from immediate left breast reconstruction with expander and flex HD placement    HPI: The patient is a 59 y.o. female here for follow-up after left nipple sparing mastectomy and left axillary sentinel node biopsy with Dr. Donne Hazel and immediate reconstruction with placement of tissue expander and Flex HD with Dr. Marla Roe on 09/27/2019.  Today Natasha Graham reports she feels well.  Denies chest pain, shortness of breath, N/V. + BM.  Drain in place on left side with serous sanguinous fluid in the bulb.  Still producing greater than 20 cc per 24-hour.  Left breast area inferior to NAC is very red, area above NAC is yellowish bruised, nipple is very dark in color.  Review of Systems  Constitutional: Negative for chills and fever.  HENT: Negative for congestion, rhinorrhea, sneezing and sore throat.   Respiratory: Negative for cough and shortness of breath.   Cardiovascular: Negative for chest pain.  Gastrointestinal: Negative for constipation, diarrhea, nausea and vomiting.  Skin: Positive for color change (redness on lower left breast). Negative for pallor and rash.     Objective:   Vital Signs BP (!) 168/85 (BP Location: Right Leg, Patient Position: Sitting, Cuff Size: Normal)   Pulse 97   Temp 98.2 F (36.8 C) (Temporal)   Ht 5\' 4"  (1.626 m)   Wt 173 lb (78.5 kg)   LMP 10/16/2013   SpO2 99%   BMI 29.70 kg/m  Vital Signs and Nursing Note Reviewed  Physical Exam  Constitutional: She is oriented to person, place, and time and well-developed, well-nourished, and in no distress.  HENT:  Head: Normocephalic and atraumatic.  Eyes: EOM are normal.  Pulmonary/Chest: Effort normal.    Musculoskeletal:        General: Normal range of motion.     Cervical back: Normal range of  motion.  Neurological: She is alert and oriented to person, place, and time. Gait normal.  Skin: Skin is warm and dry. No rash noted. No pallor.  Psychiatric: Mood, memory, affect and judgment normal.  Nursing note and vitals reviewed.     Assessment/Plan:     ICD-10-CM   1. S/P mastectomy, left  Z90.12     Natasha Graham is feeling well. Pain well controlled. Drain producing > 20 cc serosanguinous fluid per day, decreasing each day. Anticipate possible drain removal next week.  Skin inferior to nipple and nipple may not survive.  Patient to begin applying silvadene 2x per day. Will monitor closely.  We did not place injectable saline in the Expander using a sterile technique today. Left: 0 cc for a total of 170 / 455 cc  The [place] was signed into law in 2016 which includes the topic of electronic health records.  This provides immediate access to information in MyChart.  This includes consultation notes, operative notes, office notes, lab results and pathology reports.  If you have any questions about what you read please let us know at your next visit or call us at the office.  We are right here with you.   Threasa Heads, PA-C 10/06/2019, 9:30 AM

## 2019-10-05 NOTE — Telephone Encounter (Signed)
Scheduled appt per 2/1 sch message - pt is aware of appt date and time

## 2019-10-05 NOTE — Telephone Encounter (Signed)

## 2019-10-06 ENCOUNTER — Other Ambulatory Visit: Payer: Self-pay

## 2019-10-06 ENCOUNTER — Ambulatory Visit (INDEPENDENT_AMBULATORY_CARE_PROVIDER_SITE_OTHER): Payer: BC Managed Care – PPO | Admitting: Plastic Surgery

## 2019-10-06 ENCOUNTER — Encounter: Payer: Self-pay | Admitting: Plastic Surgery

## 2019-10-06 DIAGNOSIS — Z9012 Acquired absence of left breast and nipple: Secondary | ICD-10-CM

## 2019-10-06 MED ORDER — SILVER SULFADIAZINE 1 % EX CREA: 1.0000 "application " | TOPICAL_CREAM | Freq: Two times a day (BID) | CUTANEOUS | Status: DC

## 2019-10-09 ENCOUNTER — Other Ambulatory Visit (INDEPENDENT_AMBULATORY_CARE_PROVIDER_SITE_OTHER): Payer: BC Managed Care – PPO | Admitting: Plastic Surgery

## 2019-10-09 DIAGNOSIS — Z9012 Acquired absence of left breast and nipple: Secondary | ICD-10-CM

## 2019-10-09 MED ORDER — SILVER SULFADIAZINE 1 % EX CREA: TOPICAL_CREAM | Freq: Two times a day (BID) | CUTANEOUS | 1 refills | Status: AC

## 2019-10-09 NOTE — Progress Notes (Signed)
Apply Silvadene 2x per day to nipple and inferior breast area.

## 2019-10-12 NOTE — Progress Notes (Signed)
Subjective:     Patient ID: Natasha Graham, female    DOB: 12-Mar-1961, 59 y.o.   MRN: [id number]  Chief Complaint  Patient presents with  . Breast Cancer    Patient here PO for left breast reconstruction    HPI: The patient is a 59 y.o. female here for follow-up after left nipple sparing mastectomy and left axillary sentinel node biopsy with Dr. Donne Hazel and immediate reconstruction with placement of tissue expander and Flex HD with Dr. Marla Roe on 09/27/2019.  At last visit left breast was very red inferior to the NAC, yellowish bruised above the NAC, and nipple was very dark in color.  Started on Silvadene.Today bruising has mostly resolved. Inferior color is improving some. Nipple is still dark, but lower portion shows some improved color. Slow cap refill of NAC. Drain in place with < 20 cc of output for the last few days. Incision c/d/i. No signs of infection,seroma/hematoma.   Review of Systems  Constitutional: Negative for chills and fever.  HENT: Negative for congestion, sneezing and sore throat.   Respiratory: Negative for cough and shortness of breath.   Cardiovascular: Negative for chest pain.  Gastrointestinal: Negative for diarrhea, nausea and vomiting.  Skin: Positive for wound. Negative for color change, pallor and rash.  Neurological: Negative for headaches.     Objective:   Vital Signs BP (!) 183/89 (BP Location: Right Arm, Patient Position: Sitting, Cuff Size: Normal)   Pulse 79   Temp 97.7 F (36.5 C) (Temporal)   Ht 5\' 4"  (1.626 m)   Wt 173 lb 9.6 oz (78.7 kg)   LMP 10/16/2013   SpO2 98%   BMI 29.80 kg/m  Vital Signs and Nursing Note Reviewed  Physical Exam  Constitutional: She is oriented to person, place, and time and well-developed, well-nourished, and in no distress.  HENT:  Head: Normocephalic and atraumatic.  Eyes: EOM are normal.  Pulmonary/Chest: Effort normal.    Left breast: slow cap refill of NAC. Nipple dark in color with inferior  edge improving in color from last week. Beast inferior to NAC remains red, but some improvement in color. Skin closest to incision sloughing off some.  Drain in place with minimal serous fluid in bulb. < 20 cc output for last few days.   Musculoskeletal:        General: Normal range of motion.     Cervical back: Normal range of motion.  Neurological: She is alert and oriented to person, place, and time. Gait normal.  Skin: Skin is warm and dry. No rash noted. No erythema. No pallor.  Psychiatric: Mood, memory, affect and judgment normal.    Assessment/Plan:     ICD-10-CM   1. S/P mastectomy, left  Z90.12     Natasha Graham is overall doing well today.  Incision on left breast is healing, C/D/I.  Overall color is improving some, inferior to NAC still show signs of redness and some sloughing of skin near the incision, nipple is still dark in color, cap refill to NAC is slow.  Drain removed today. We removed injectable saline from the Expander using a sterile technique to remove additional pressure from the skin in the left breast: Left: -30 cc for a total of 140 / 455 cc  Continue applying Silvadene to the left breast on nipple area, NAC and inferior portion of the breast.  Apply Vaseline and gauze to drain site.  Call office for any signs of swelling, increased pain, color changes that are not  improving.  Follow-up in 1 week.  Pictures were obtained of the patient and placed in the chart with the patient's or guardian's permission.  The [place] was signed into law in 2016 which includes the topic of electronic health records.  This provides immediate access to information in MyChart.  This includes consultation notes, operative notes, office notes, lab results and pathology reports.  If you have any questions about what you read please let us know at your next visit or call us at the office.  We are right here with you.   Threasa Heads, PA-C 10/13/2019, 9:28 AM

## 2019-10-13 ENCOUNTER — Telehealth: Payer: Self-pay | Admitting: *Deleted

## 2019-10-13 ENCOUNTER — Encounter: Payer: Self-pay | Admitting: Plastic Surgery

## 2019-10-13 ENCOUNTER — Other Ambulatory Visit: Payer: Self-pay

## 2019-10-13 ENCOUNTER — Ambulatory Visit (INDEPENDENT_AMBULATORY_CARE_PROVIDER_SITE_OTHER): Payer: BC Managed Care – PPO | Admitting: Plastic Surgery

## 2019-10-13 VITALS — BP 183/89 | HR 79 | Temp 97.7°F | Ht 64.0 in | Wt 173.6 lb

## 2019-10-13 DIAGNOSIS — Z9012 Acquired absence of left breast and nipple: Secondary | ICD-10-CM

## 2019-10-13 NOTE — Telephone Encounter (Signed)
Received mammaprint results of low risk. Patient is aware. 

## 2019-10-17 ENCOUNTER — Encounter: Payer: BLUE CROSS/BLUE SHIELD | Admitting: Plastic Surgery

## 2019-10-17 NOTE — Progress Notes (Signed)
[facility]  [phone] [phone]     ID: Natasha Graham DOB: 02/22/1961  MR#: [id number]  CSN#:[id number]  Patient Care Team: Hamrick, Maura L, MD as PCP - General (Family Medicine) Wakefield, Matthew, MD as Consulting Physician (General Surgery) Magrinat, Gustav C, MD as Consulting Physician (Oncology) Stuart, Dawn C, RN as Registered Nurse Martini, Keisha N, RN as Registered Nurse Dillingham, Claire S, DO as Attending Physician (Plastic Surgery) OTHER MD: Clare Sanger MD  CHIEF COMPLAINT: new estrogen receptor positive left breast cancer; remote noninvasive right breast cancer (s/p bilateral mastectomies)  CURRENT TREATMENT: Awaiting further surgery, radiation, and staging scans  INTERVAL HISTORY: Natasha Graham returns today to the breast cancer clinic.  I had seen her remotely when she had a right-sided noninvasive breast cancer.  She has now been found to have left sided breast cancer as detailed below.  She is here to discuss staging and systemic treatment options.  LEFT BREAST CANCER HISTORY: She had routine screening left mammogram on 06/14/2019 showing a developing asymmetry. She presented for left diagnostic mammogram and left breast ultrasound on 07/07/2019. Physical exam that day showed a palpable, firm, superficial mass in the left breast at 6 o'clock along the site of reduction mammoplasty scar (performed in 2015 with right mastectomy). Scans showed: breast density category B; 2.1 cm superficial mass involving the skin in the left breast at 6 o'clock; no enlarged adenopathy in the left axilla.   Accordingly on 07/12/2019 she proceeded to biopsy of the left breast area in question. The pathology from this procedure ([id number]) showed: invasive lobular carcinoma, grade 1, with perineural invasion present. Prognostic indicators significant for: estrogen receptor, 60% positive with moderate staining intensity and progesterone receptor, 0% negative.  Proliferation marker Ki67 at <1%. HER2 equvocal by immunohistochemistry (2+), but negative by fluorescent in situ hybridization with a signals ratio 1.18 and number per cell 1.95.  She opted to proceed with left mastectomy on 09/27/2019 under Dr. Wakefield with immediate reconstruction under Dr. Dillingham. 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  Two lymph nodes were biopsied. One showed metastatic lobular carcinoma with focus measuring 2.1 cm with evidence of extranodal extension. The other lymph node also showed metastatic lobular carcinoma with no lymphoid tissue present.  Biopsy of the nipple was also taken at that time and showed no evidence of invasive carcinoma.  Mammaprint was performed on the final surgical sample. This returned showing low risk.   REVIEW OF SYSTEMS: Natasha Graham did well with her surgery, with minimal pain, no significant problems with bleeding or fever.  She had her drain in for about 2 weeks.  She has an expander in place and view of eventual saline reconstruction.  She denies unusual headaches visual changes cough phlegm production pleurisy or any significant change in bowel or bladder habits.  A detailed review of systems today was otherwise stable.   RIGHT BREAST CANCER HISTORY:  Natasha Graham has a history of right breast biopsy in late 2007 showing atypical ductal hyperplasia in the upper outer right breast.. Breast MRI 07/13/2006 showed postbiopsy changes in the upper right breast, and some right breast cysts. There were no solid masses however or areas of abnormal enhancement. There were no enlarged lymph nodes. Incidental finding of liver cysts was made. On 07/30/2006 she underwent needle   localization of the  calcifications in the outer right breast which also showed ([id number]) atypical ductal hyperplasia, with no malignancy.   On 10/21/2012 routine digital bilateral screening mammography at the breast Center showed breast density to be category C. In the right breast there were calcifications and architectural distortion to go with skin retraction. Additional views obtained 11/17/2012 showed extensive but stable punctate microcalcifications in the upper outer right breast. Many of these were superficial and not amenable to stereotactic biopsy. MRI was suggested but the patient was unable to undergo that test and therefore six-month mammography was obtained 05/15/2013. This showed the calcifications in the outer right breast to have been stable. Because of their extents and because of their superficial location they could not be all sampled all removed with biopsy. MRI was again discussed, but the patient has Harrington rods in place and also felt the cost of MRI was prohibitive.  On 11/13/2013 further six-month follow-up showed a grouping of intermediate microcalcifications in the lateral right breast and these were biopsied 11/20/2013. The result of that procedure ([id number]) showed atypical ductal hyperplasia. Accordingly on 02/23/2014 the patient underwent right lumpectomy, with the pathology ([id number]) showing ductal carcinoma in situ, grade 2, measuring 0.7 cm. Estrogen receptor was 100% positive. Progesterone receptor was 96% positive. Both showed strong staining intensity. Margins were clear but the closest margin anteriorly was at 1 mm.  On 03/07/2014 the patient underwent postoperative mammography which showed post lumpectomy changes but also calcifications extending from the lumpectomy site anteriorly towards the nipple. On 03/12/2014 the patient underwent bilateral breast MRI. Breast composition here was described as category B. In the right breast there was a 4.8 cm fluidlike excisional  cavity in the lower outer right breast. Adjacent to this was a focus of non-masslike enhancement measuring 1.9 cm. This is an area associated with the scar from the excision in 2007. There was also non-masslike enhancement inferior to the biopsy cavity measuring 3.8 cm. There were no abnormal lymph nodes and no masses or abnormal enhancement in the left breast. Specifically the left breast lower outer quadrant was unchanged from 2007 MRI.  Overall it was felt the linear non-masslike enhancement extending almost 4 cm inferior to the excisional cavity was sufficiently suspicious to warrant biopsy, and this was performed 03/29/2014. It again showed ductal carcinoma in situ, grade 1, estrogen receptor and progesterone receptor both 100% positive with strong staining intensity.  At this point the patient met with surgery and opted for definitive right mastectomy with immediate reconstruction. On 05/16/2014 she underwent simple mastectomy with sentinel lymph node sampling. The pathology ([id number]) showed atypical ductal hyperplasia but no evidence of neoplasia. All 5 sentinel lymph nodes were clear. The patient had immediate right breast reconstruction with tissue expander and dermal matrix placement.  She is scheduled for definitive implant placement 08/08/2014.  Her subsequent history is as detailed below   PAST MEDICAL HISTORY: Past Medical History:  Diagnosis Date  . Cancer (HCC)    breast cancer  . Hypertension   . PONV (postoperative nausea and vomiting)   . Scoliosis   . Wears glasses     PAST SURGICAL HISTORY: Past Surgical History:  Procedure Laterality Date  . AUGMENTATION MAMMAPLASTY Right    2015 post mastectomy  . BREAST RECONSTRUCTION WITH PLACEMENT OF TISSUE EXPANDER AND FLEX HD (ACELLULAR HYDRATED DERMIS) Right 05/16/2014   Procedure: IMMEDIATE RIGHT BREAST RECONSTRUCTION WITH PLACEMENT OF TISSUE EXPANDER AND FLEX HD (ACELLULAR HYDRATED DERMIS);  Surgeon: Claire Sanger, DO;     Location: [facility];  Service: Plastics;  Laterality: Right;  . BREAST RECONSTRUCTION WITH PLACEMENT OF TISSUE EXPANDER AND FLEX HD (ACELLULAR HYDRATED DERMIS) Left 09/27/2019   Procedure: LEFT BREAST RECONSTRUCTION WITH PLACEMENT OF TISSUE EXPANDER AND FLEX HD (ACELLULAR HYDRATED DERMIS);  Surgeon: Wallace Going, DO;  Location: [facility];  Service: Plastics;  Laterality: Left;  . BREAST SURGERY     right breast excisional biopsy  . DILATION AND CURETTAGE OF UTERUS    . HYSTEROSCOPY WITH D & C N/A 09/20/2015   Procedure: DILATATION AND CURETTAGE /HYSTEROSCOPY;  Surgeon: Brien Few, MD;  Location: [facility];  Service: Gynecology;  Laterality: N/A;  . LIPOSUCTION Bilateral 08/08/2014   Procedure: LIPO SUCTION ;  Surgeon: Theodoro Kos, DO;  Location: [facility];  Service: Plastics;  Laterality: Bilateral;  . MASTECTOMY Right 05/16/2014   placement of acellular dermal matrix & tissue expanders   . MASTOPEXY Left 08/08/2014   Procedure:  MASTOPEXY FOR SYMMETRY;  Surgeon: Theodoro Kos, DO;  Location: [facility];  Service: Plastics;  Laterality: Left;  . NIPPLE SPARING MASTECTOMY WITH SENTINEL LYMPH NODE BIOPSY Left 09/27/2019   Procedure: LEFT NIPPLE SPARING MASTECTOMY WITH LEFT AXILLARY SENTINEL LYMPH NODE BIOPSY;  Surgeon: Rolm Bookbinder, MD;  Location: [facility];  Service: General;  Laterality: Left;  . REDUCTION MAMMAPLASTY Left    2015  . REMOVAL OF TISSUE EXPANDER AND PLACEMENT OF IMPLANT Right 08/08/2014   Procedure: REMOVAL OF RIGHT  TISSUE EXPANDERS WITH PLACEMENT OF RIGHT BREAST IMPLANTS WITH LIPO SUCTION ;  Surgeon: Theodoro Kos, DO;  Location: [facility];  Service: Plastics;  Laterality: Right;  . scoliosis  1972   harrington rods-age 58  . TONSILLECTOMY      FAMILY HISTORY Family History  Problem Relation Age of Onset  . Cancer Father        liver  . Breast cancer Paternal Aunt    there is  significant cancer history on the paternal side, her father dying at the age of 30 with what the patient tells me was metastatic head and neck cancer (but also involving the colon and liver). One of the patient's father's brothers had stomach cancer, and another had a brain tumor, and 2 of the patient's father's sisters had breast cancer, 1 diagnosed at age 52, the other at age 22.  On the mother's side there is a history of non-Hodgkin's lymphoma at age 58. There is no history of ovarian cancer in the family   GYNECOLOGIC HISTORY:  Patient's last menstrual period was 10/16/2013.  menarche age 24, first live birth age 85. The patient is GX P3. She stopped having periods in October 2014. She did not take hormone replacement. She did take birth control pills for approximately 1 year remotely, with no complications.   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 She works as an Web designer. Son Truman Hayward is a truck Geophysicist/field seismologist. Daughter Thayer Headings is a Forensic psychologist and currently          .  Son Roselyn Reef      . The patient has        grandchildren. She attends [employer]    ADVANCED DIRECTIVES: In the absence of any documents to the contrary the patient's husband is her healthcare power of attorney  HEALTH MAINTENANCE: Social History   Tobacco Use  . Smoking status: Never Smoker  .  Smokeless tobacco: Never Used  Substance Use Topics  . Alcohol use: No  . Drug use: No     Colonoscopy:  2013  PAP: March 2015  Bone density:  Lipid panel:  No Known Allergies  Current Outpatient Medications  Medication Sig Dispense Refill  . Ascorbic Acid (VITAMIN C) 100 MG tablet Take 100 mg by mouth daily.    . Flaxseed Oil (LINSEED OIL) OIL by Misc.(Non-Drug; Combo Route) route.    . Ginger, Zingiber officinalis, (GINGER PO) Take by mouth.    . lisinopril-hydrochlorothiazide (PRINZIDE,ZESTORETIC) 20-25 MG per  tablet Take 1 tablet by mouth every evening.     . Multiple Vitamin (MULTIVITAMIN) capsule Take by mouth.    . Pediatric Multivitamins-Fl (MULTI VIT/FL PO) Take by mouth.    . silver sulfADIAZINE (SILVADENE) 1 % cream Apply topically 2 (two) times daily for 10 days. Apply to affected area twice daily 50 g 1  . tamoxifen (NOLVADEX) 20 MG tablet Take 1 tablet (20 mg total) by mouth daily. To start after completion of radiation therapy 90 tablet 4  . Turmeric (QC TUMERIC COMPLEX) 500 MG CAPS Take by mouth.     No current facility-administered medications for this visit.    OBJECTIVE: White woman who appears younger than stated age Vitals:   10/18/19 1120  BP: (!) 144/71  Pulse: 91  Resp: 18  Temp: (!) 97 F (36.1 C)  SpO2: 99%     Body mass index is 29.75 kg/m.    ECOG FS:1 - Symptomatic but completely ambulatory  Sclerae unicteric, EOMs intact Wearing a mask No cervical or supraclavicular adenopathy Lungs no rales or rhonchi Heart regular rate and rhythm Abd soft, nontender, positive bowel sounds MSK no focal spinal tenderness, no upper extremity lymphedema Neuro: nonfocal, well oriented, appropriate affect Breasts: The right breast is status post lumpectomy with saline reconstruction.  There is no evidence of disease recurrence.  The left breast is status post mastectomy with expander in place.  The incision is healing well although there is some anterior desquamation.  Initial cosmetic appearance is good.  Both axillae are benign.  LAB RESULTS:  CMP     Component Value Date/Time   NA 142 10/18/2019 1102   NA 143 07/30/2014 1603   K 3.5 10/18/2019 1102   K 3.5 07/30/2014 1603   CL 103 10/18/2019 1102   CO2 29 10/18/2019 1102   CO2 30 (H) 07/30/2014 1603   GLUCOSE 106 (H) 10/18/2019 1102   GLUCOSE 93 07/30/2014 1603   BUN 10 10/18/2019 1102   BUN 10.5 07/30/2014 1603   CREATININE 0.78 10/18/2019 1102   CREATININE 0.9 07/30/2014 1603   CALCIUM 8.9 10/18/2019 1102    CALCIUM 9.5 07/30/2014 1603   PROT 7.1 10/18/2019 1102   PROT 7.0 07/30/2014 1603   ALBUMIN 4.0 10/18/2019 1102   ALBUMIN 4.0 07/30/2014 1603   AST 18 10/18/2019 1102   AST 23 07/30/2014 1603   ALT 24 10/18/2019 1102   ALT 23 07/30/2014 1603   ALKPHOS 86 10/18/2019 1102   ALKPHOS 78 07/30/2014 1603   BILITOT 0.4 10/18/2019 1102   BILITOT 0.29 07/30/2014 1603   GFRNONAA >60 10/18/2019 1102   GFRAA >60 10/18/2019 1102    INo results found for: SPEP, UPEP  Lab Results  Component Value Date   WBC 5.7 10/18/2019   NEUTROABS 3.2 10/18/2019   HGB 13.7 10/18/2019   HCT 39.3 10/18/2019   MCV 91.8 10/18/2019   PLT 343 10/18/2019        Chemistry      Component Value Date/Time   NA 142 10/18/2019 1102   NA 143 07/30/2014 1603   K 3.5 10/18/2019 1102   K 3.5 07/30/2014 1603   CL 103 10/18/2019 1102   CO2 29 10/18/2019 1102   CO2 30 (H) 07/30/2014 1603   BUN 10 10/18/2019 1102   BUN 10.5 07/30/2014 1603   CREATININE 0.78 10/18/2019 1102   CREATININE 0.9 07/30/2014 1603      Component Value Date/Time   CALCIUM 8.9 10/18/2019 1102   CALCIUM 9.5 07/30/2014 1603   ALKPHOS 86 10/18/2019 1102   ALKPHOS 78 07/30/2014 1603   AST 18 10/18/2019 1102   AST 23 07/30/2014 1603   ALT 24 10/18/2019 1102   ALT 23 07/30/2014 1603   BILITOT 0.4 10/18/2019 1102   BILITOT 0.29 07/30/2014 1603      No results found for: LABCA2  No components found for: LABCA125  No results for input(s): INR in the last 168 hours.  Urinalysis No results found for: COLORURINE, APPEARANCEUR, LABSPEC, PHURINE, GLUCOSEU, HGBUR, BILIRUBINUR, KETONESUR, PROTEINUR, UROBILINOGEN, NITRITE, LEUKOCYTESUR   STUDIES: NM Sentinel Node Inj-No Rpt (Breast)  Result Date: 09/27/2019 Sulfur colloid was injected by the nuclear medicine technologist for melanoma sentinel node.      ASSESSMENT: 58 y.o. [place], [place] woman  RIGHT BREAST CANCER (1) status post right breast upper-outer quadrant lumpectomy 07/30/2006  showing atypical ductal hyperplasia.  (2) right breast upper outer quadrant biopsy 11/20/2013 showed atypical ductal hyperplasia   (3) right lumpectomy 02/26/2014 showed ductal carcinoma in situ measuring 0.7 cm, estrogen receptor 100% positive, progesterone receptor 96% positive, with 1 mm margins  (4) right breast lower inner quadrant biopsy 03/29/2014 showed ductal carcinoma in situ, 100% estrogen receptor positive, 100% progesterone receptor positive,  (5) status post right mastectomy with sentinel lymph node sampling showing only atypical ductal hyperplasia,  (a) 5 sentinel lymph nodes removed, all clear  (b) status post right saline implant reconstruction  (6) the patient opted against prophylactic antiestrogens   LEFT BREAST CANCER (7) status post left breast lower outer quadrant biopsy 07/12/2019 for a clinical T1N0, stage IA invasive lobular carcinoma, grade 1, with evidence of perineural invasion, estrogen receptor moderately positive, progesterone receptor negative, HER-2 not amplified, with an MIB-1 of less than 1%  (8) status post left nipple sparing mastectomy 09/27/2019 for a pT3 pN1, stage IIIA invasive lobular carcinoma, grade 2, with a negative nipple biopsy and a focally positive anterior margin  (a) 1 sentinel lymph node had a 2.1 cm tumor deposit; a second possible lymph node had no residual lymph node tissue  (b) immediate expander placement  (c) axillary lymph node dissection pending  (d) repeat prognostic panel pending  (9) MammaPrint "low risk" predicts no significant benefit from chemotherapy.  (10) adjuvant radiation pending  (11) staging studies: pending  (a) chest, abdomen and pelvic CT with contrast  (b) consider F18 estradiol PET scan  (12) adjuvant tamoxifen to follow  (13) genetics testing  PLAN: I reviewed the recent developments with Natasha Graham and she understands that this time she has an invasive cancer whereas before it was noninvasive.   Furthermore this cancer is locally advanced, stage III.  We discussed the difference between ductal and lobular breast cancers.  Lobular breast cancers do not make a "glue" and therefore the cancer cells do not hang together.  The result of this is the cancer is very difficult to detect.  Surgically it is hard to feel an edge   and on radiology frequently no masses are seen even when prior biopsy has shown tumor to be present.  The risk of systemic spread is greater in stage III and therefore we generally stage these patients with CT scans and I have ordered these.  They are likely to be negative as just discussed.  We have a new type of PET scan, using fluoro18estradiol instead of glucose.  This is much more sensitive than the standard PET in lobular breast cancer cases and depending on the results of her prior CT scans we will consider proceeding to this particular study  If work-up shows only stage III disease, then she will have completion axillary lymph node dissection and adjuvant radiation.  She will continue to have filling of her expander and likely definitive reconstruction after the radiation.  She will then be ready to start antiestrogens. We discussed the difference between tamoxifen and anastrozole in detail. She understands that anastrozole and the aromatase inhibitors in general work by blocking estrogen production. Accordingly vaginal dryness, decrease in bone density, and of course hot flashes can result. The aromatase inhibitors can also negatively affect the cholesterol profile, although that is a minor effect. One out of 5 women on aromatase inhibitors we will feel "old and achy". This arthralgia/myalgia syndrome, which resembles fibromyalgia clinically, does resolve with stopping the medications. Accordingly this is not a reason to not try an aromatase inhibitor but it is a frequent reason to stop it (in other words 20% of women will not be able to tolerate these  medications).  Tamoxifen on the other hand does not block estrogen production. It does not "take away a woman's estrogen". It blocks the estrogen receptor in breast cells. Like anastrozole, it can also cause hot flashes. As opposed to anastrozole, tamoxifen has many estrogen-like effects. It is technically an estrogen receptor modulator. This means that in some tissues tamoxifen works like estrogen-- for example it helps strengthen the bones. It tends to improve the cholesterol profile. It can cause thickening of the endometrial lining, and even endometrial polyps or rarely cancer of the uterus.(The risk of uterine cancer due to tamoxifen is one additional cancer per thousand women year). It can cause vaginal wetness or stickiness. It can cause blood clots through this estrogen-like effect--the risk of blood clots with tamoxifen is exactly the same as with birth control pills or hormone replacement.  Neither of these agents causes mood changes or weight gain, despite the popular belief that they can have these side effects. We have data from studies comparing either of these drugs with placebo, and in those cases the control group had the same amount of weight gain and depression as the group that took the drug.  In the end we decided that she would likely start with tamoxifen, but we will not start that until radiation has been completed.  Tentatively she will see me again in about a month, but of course I will contact her once we have the results of the scans and if necessary I will see her before then.  Total encounter time 65 minutes.*  Gustav C. Magrinat, MD 10/18/2019 3:57 PM  Oncology and Hematology [facility] [address] Tel. [phone]  FAX [phone]   I, Katie Daubenspeck, am acting as scribe for Dr. Gustav C. Magrinat.  I, Gustav Magrinat MD, have reviewed the above documentation for accuracy and completeness, and I agree with the above.    *Total  Encounter Time as defined by the Centers   for Medicare and Medicaid Services includes, in addition to the face-to-face time of a patient visit (documented in the note above) non-face-to-face time: obtaining and reviewing outside history, ordering and reviewing medications, tests or procedures, care coordination (communications with other health care professionals or caregivers) and documentation in the medical record.    

## 2019-10-18 ENCOUNTER — Inpatient Hospital Stay: Payer: BC Managed Care – PPO

## 2019-10-18 ENCOUNTER — Inpatient Hospital Stay: Payer: BC Managed Care – PPO | Attending: Oncology | Admitting: Oncology

## 2019-10-18 ENCOUNTER — Other Ambulatory Visit: Payer: Self-pay

## 2019-10-18 VITALS — BP 144/71 | HR 91 | Temp 97.0°F | Resp 18 | Ht 64.0 in | Wt 173.3 lb

## 2019-10-18 DIAGNOSIS — Z853 Personal history of malignant neoplasm of breast: Secondary | ICD-10-CM

## 2019-10-18 DIAGNOSIS — C50912 Malignant neoplasm of unspecified site of left female breast: Secondary | ICD-10-CM

## 2019-10-18 DIAGNOSIS — Z170 Estrogen receptor positive status [ER+]: Secondary | ICD-10-CM | POA: Diagnosis not present

## 2019-10-18 DIAGNOSIS — Z79899 Other long term (current) drug therapy: Secondary | ICD-10-CM | POA: Diagnosis not present

## 2019-10-18 DIAGNOSIS — D0511 Intraductal carcinoma in situ of right breast: Secondary | ICD-10-CM

## 2019-10-18 DIAGNOSIS — Z9013 Acquired absence of bilateral breasts and nipples: Secondary | ICD-10-CM

## 2019-10-18 DIAGNOSIS — C50512 Malignant neoplasm of lower-outer quadrant of left female breast: Secondary | ICD-10-CM | POA: Diagnosis not present

## 2019-10-18 DIAGNOSIS — C50112 Malignant neoplasm of central portion of left female breast: Secondary | ICD-10-CM | POA: Insufficient documentation

## 2019-10-18 LAB — CBC WITH DIFFERENTIAL/PLATELET
Abs Immature Granulocytes: 0.01 10*3/uL (ref 0.00–0.07)
Basophils Absolute: 0.1 10*3/uL (ref 0.0–0.1)
Basophils Relative: 1 %
Eosinophils Absolute: 0.2 10*3/uL (ref 0.0–0.5)
Eosinophils Relative: 3 %
HCT: 39.3 % (ref 36.0–46.0)
Hemoglobin: 13.7 g/dL (ref 12.0–15.0)
Immature Granulocytes: 0 %
Lymphocytes Relative: 33 %
Lymphs Abs: 1.9 10*3/uL (ref 0.7–4.0)
MCH: 32 pg (ref 26.0–34.0)
MCHC: 34.9 g/dL (ref 30.0–36.0)
MCV: 91.8 fL (ref 80.0–100.0)
Monocytes Absolute: 0.4 10*3/uL (ref 0.1–1.0)
Monocytes Relative: 7 %
Neutro Abs: 3.2 10*3/uL (ref 1.7–7.7)
Neutrophils Relative %: 56 %
Platelets: 343 10*3/uL (ref 150–400)
RBC: 4.28 MIL/uL (ref 3.87–5.11)
RDW: 12.5 % (ref 11.5–15.5)
WBC: 5.7 10*3/uL (ref 4.0–10.5)
nRBC: 0 % (ref 0.0–0.2)

## 2019-10-18 LAB — COMPREHENSIVE METABOLIC PANEL
ALT: 24 U/L (ref 0–44)
AST: 18 U/L (ref 15–41)
Albumin: 4 g/dL (ref 3.5–5.0)
Alkaline Phosphatase: 86 U/L (ref 38–126)
Anion gap: 10 (ref 5–15)
BUN: 10 mg/dL (ref 6–20)
CO2: 29 mmol/L (ref 22–32)
Calcium: 8.9 mg/dL (ref 8.9–10.3)
Chloride: 103 mmol/L (ref 98–111)
Creatinine, Ser: 0.78 mg/dL (ref 0.44–1.00)
GFR calc Af Amer: >60 mL/min (ref >60)
GFR calc non Af Amer: >60 mL/min (ref >60)
Glucose, Bld: 106 mg/dL — ABNORMAL HIGH (ref 70–99)
Potassium: 3.5 mmol/L (ref 3.5–5.1)
Sodium: 142 mmol/L (ref 135–145)
Total Bilirubin: 0.4 mg/dL (ref 0.3–1.2)
Total Protein: 7.1 g/dL (ref 6.5–8.1)

## 2019-10-18 MED ORDER — TAMOXIFEN CITRATE 20 MG PO TABS: 20.0000 mg | ORAL_TABLET | Freq: Every day | ORAL | 4 refills | Status: DC

## 2019-10-19 ENCOUNTER — Telehealth: Payer: Self-pay | Admitting: Oncology

## 2019-10-19 LAB — CANCER ANTIGEN 27.29: CA 27.29: 20.6 U/mL (ref 0.0–38.6)

## 2019-10-19 NOTE — Telephone Encounter (Signed)
I talk with patient regarding schedule  

## 2019-10-20 ENCOUNTER — Encounter: Payer: BC Managed Care – PPO | Admitting: Plastic Surgery

## 2019-10-20 ENCOUNTER — Other Ambulatory Visit: Payer: Self-pay | Admitting: Oncology

## 2019-10-20 DIAGNOSIS — C50512 Malignant neoplasm of lower-outer quadrant of left female breast: Secondary | ICD-10-CM

## 2019-10-20 DIAGNOSIS — Z170 Estrogen receptor positive status [ER+]: Secondary | ICD-10-CM

## 2019-10-20 NOTE — Progress Notes (Signed)
Natasha Graham is scheduled for her CT scans on 10/31/2019.  I have added an F-18 PET scan for a couple of days later.

## 2019-10-24 ENCOUNTER — Encounter: Payer: Self-pay | Admitting: Oncology

## 2019-10-24 ENCOUNTER — Other Ambulatory Visit: Payer: Self-pay | Admitting: Oncology

## 2019-10-24 LAB — SURGICAL PATHOLOGY

## 2019-10-26 NOTE — Progress Notes (Cosign Needed)
Subjective:     Patient ID: Natasha Graham, female    DOB: 05-Apr-1961, 59 y.o.   MRN: [id number]  Chief Complaint  Patient presents with  . Breast Cancer    Patient here for PO follow up from immediate left breast reconstruction with flex HD placement SX: 09/27/19    HPI: The patient is a 59 y.o. female here for follow-up after left nipple sparing mastectomy and left axillary sentinel node biopsy with Dr. Donne Hazel and immediate reconstruction with placement of tissue expander and Flex HD with Dr. Marla Roe on 09/27/19.  Summary of last visit: Left breast was red with some sloughing of skin inferior to NAC, nipple was dark with inferior portion slightly improved in color, slow cap refill of NAC. Applying silvadene twice daily.   Today Natasha Graham reports she thinks her left breast is improving.  NAC and inferior left breast color has greatly improved. Some skin sloughing off NAC and inferior breast, but skin underneath looks good. Patient reports her margins were not clear. She has a CT scheduled for Tuesday and will undergo re-excision of left breast and removal of additional lymph nodes and then radiation.  Surgery and radiation has not yet been scheduled.   Review of Systems  Constitutional: Negative for chills and fever.  HENT: Negative for congestion, rhinorrhea, sneezing and sore throat.   Respiratory: Negative for cough and shortness of breath.   Cardiovascular: Negative for chest pain.  Gastrointestinal: Negative for constipation, diarrhea, nausea and vomiting.  Skin: Negative for pallor and rash.       Skin of left breast NAC and inferior portion have improved in color.      Objective:   Vital Signs BP (!) 145/84 (BP Location: Right Arm, Patient Position: Sitting, Cuff Size: Large)   Pulse 65   Temp 97.8 F (36.6 C) (Temporal)   Ht 5\' 4"  (1.626 m)   Wt 172 lb 6.4 oz (78.2 kg)   LMP 10/16/2013   SpO2 97%   BMI 29.59 kg/m  Vital Signs and Nursing Note  Reviewed  Physical Exam  Constitutional: She is oriented to person, place, and time and well-developed, well-nourished, and in no distress.  HENT:  Head: Normocephalic and atraumatic.  Eyes: EOM are normal.  Pulmonary/Chest: Effort normal.  Left breast: Color has improved greatly of NAC and inferior area. Dead skin sloughing off, but leaving more viable looking skin in it's place. Nipple still has small black area, but greatly improved from last visit.  Drain site closed and healing well, small amount of irritation redness.  Musculoskeletal:        General: Normal range of motion.     Cervical back: Normal range of motion.  Neurological: She is alert and oriented to person, place, and time. Gait normal.  Skin: Skin is warm and dry. No rash noted. No erythema. No pallor.  Psychiatric: Mood, memory, affect and judgment normal.     Assessment/Plan:     ICD-10-CM   1. S/P mastectomy, left  Z90.12   2. Malignant neoplasm of lower-outer quadrant of left breast of female, estrogen receptor positive ([place])  C50.512    Z17.0     Natasha Graham reports that her margins were not clear.  She will have a CT scan on Tuesday followed by surgery for reexcision of the left breast and removal of additional lymph nodes.  This will be followed by radiation.  She is unsure at this time of the timing of the surgery and radiation.  The skin of her left breast is greatly improving with majority of the NAC and inferior portion of the breast now showing viable skin.  Small amount of nipple is still very dark.    We did not place injectable saline in the Expander using a sterile technique today to allow additional time for the left breast tissue to heal. Left: 0 cc for a total of 140 / 455 cc  Do daily dressing changes for left breast NAC and inferior portion of antibiotic ointment or vaseline and cover with gauze or ABD.  Follow up in 2 weeks.  Call office with any questions/concerns or if condition worsens.   Will monitor timing of surgery/radiation and coordinate as appropriate.  The [place] was signed into law in 2016 which includes the topic of electronic health records.  This provides immediate access to information in MyChart.  This includes consultation notes, operative notes, office notes, lab results and pathology reports.  If you have any questions about what you read please let us know at your next visit or call us at the office.  We are right here with you.      Threasa Heads, PA-C 10/27/2019, 11:30 AM

## 2019-10-27 ENCOUNTER — Other Ambulatory Visit: Payer: Self-pay | Admitting: Oncology

## 2019-10-27 ENCOUNTER — Other Ambulatory Visit: Payer: Self-pay

## 2019-10-27 ENCOUNTER — Ambulatory Visit (INDEPENDENT_AMBULATORY_CARE_PROVIDER_SITE_OTHER): Payer: BC Managed Care – PPO | Admitting: Plastic Surgery

## 2019-10-27 ENCOUNTER — Encounter: Payer: Self-pay | Admitting: Plastic Surgery

## 2019-10-27 VITALS — BP 145/84 | HR 65 | Temp 97.8°F | Ht 64.0 in | Wt 172.4 lb

## 2019-10-27 DIAGNOSIS — Z170 Estrogen receptor positive status [ER+]: Secondary | ICD-10-CM

## 2019-10-27 DIAGNOSIS — C50512 Malignant neoplasm of lower-outer quadrant of left female breast: Secondary | ICD-10-CM

## 2019-10-27 DIAGNOSIS — Z9012 Acquired absence of left breast and nipple: Secondary | ICD-10-CM

## 2019-10-31 ENCOUNTER — Encounter (HOSPITAL_COMMUNITY): Payer: Self-pay

## 2019-10-31 ENCOUNTER — Other Ambulatory Visit: Payer: Self-pay | Admitting: Oncology

## 2019-10-31 ENCOUNTER — Ambulatory Visit (HOSPITAL_COMMUNITY)
Admission: RE | Admit: 2019-10-31 | Discharge: 2019-10-31 | Disposition: A | Discharge status: Home / Self Care | Payer: BC Managed Care – PPO | Source: Ambulatory Visit | Attending: Oncology | Admitting: Oncology

## 2019-10-31 ENCOUNTER — Other Ambulatory Visit: Payer: Self-pay

## 2019-10-31 DIAGNOSIS — C50112 Malignant neoplasm of central portion of left female breast: Secondary | ICD-10-CM | POA: Diagnosis not present

## 2019-10-31 DIAGNOSIS — C50512 Malignant neoplasm of lower-outer quadrant of left female breast: Secondary | ICD-10-CM | POA: Diagnosis not present

## 2019-10-31 DIAGNOSIS — D259 Leiomyoma of uterus, unspecified: Secondary | ICD-10-CM | POA: Diagnosis not present

## 2019-10-31 DIAGNOSIS — C50912 Malignant neoplasm of unspecified site of left female breast: Secondary | ICD-10-CM | POA: Insufficient documentation

## 2019-10-31 DIAGNOSIS — K760 Fatty (change of) liver, not elsewhere classified: Secondary | ICD-10-CM | POA: Diagnosis not present

## 2019-10-31 DIAGNOSIS — Z170 Estrogen receptor positive status [ER+]: Secondary | ICD-10-CM | POA: Diagnosis not present

## 2019-10-31 DIAGNOSIS — D0511 Intraductal carcinoma in situ of right breast: Secondary | ICD-10-CM | POA: Diagnosis not present

## 2019-10-31 DIAGNOSIS — R911 Solitary pulmonary nodule: Secondary | ICD-10-CM | POA: Diagnosis not present

## 2019-10-31 MED ORDER — SODIUM CHLORIDE (PF) 0.9 % IJ SOLN
INTRAMUSCULAR | Status: AC
  Filled 2019-10-31: qty 50

## 2019-10-31 MED ORDER — IOHEXOL 300 MG/ML  SOLN
100.0000 mL | Freq: Once | INTRAMUSCULAR | Status: AC | PRN
  Administered 2019-10-31: 100 mL via INTRAVENOUS

## 2019-10-31 NOTE — Progress Notes (Signed)
I called Hailo and gave her the news of the CTs which are very favorable.  She cannot proceed to surgery as originally planned.

## 2019-11-01 ENCOUNTER — Other Ambulatory Visit: Payer: Self-pay | Admitting: General Surgery

## 2019-11-01 ENCOUNTER — Encounter: Payer: Self-pay | Admitting: *Deleted

## 2019-11-03 ENCOUNTER — Encounter: Payer: Self-pay | Admitting: *Deleted

## 2019-11-03 ENCOUNTER — Telehealth: Payer: Self-pay | Admitting: Oncology

## 2019-11-03 NOTE — Telephone Encounter (Signed)
R/s appt per 3/5 sch message- pt is aware of new appt date and time

## 2019-11-06 ENCOUNTER — Encounter: Payer: Self-pay | Admitting: *Deleted

## 2019-11-06 ENCOUNTER — Other Ambulatory Visit: Payer: Self-pay

## 2019-11-06 ENCOUNTER — Encounter (HOSPITAL_BASED_OUTPATIENT_CLINIC_OR_DEPARTMENT_OTHER): Payer: Self-pay | Admitting: General Surgery

## 2019-11-09 ENCOUNTER — Other Ambulatory Visit (HOSPITAL_COMMUNITY)
Admission: RE | Admit: 2019-11-09 | Discharge: 2019-11-09 | Disposition: A | Discharge status: Home / Self Care | Payer: BC Managed Care – PPO | Source: Ambulatory Visit | Attending: General Surgery | Admitting: General Surgery

## 2019-11-09 DIAGNOSIS — Z20822 Contact with and (suspected) exposure to covid-19: Secondary | ICD-10-CM | POA: Insufficient documentation

## 2019-11-09 LAB — SARS CORONAVIRUS 2 (TAT 6-24 HRS): SARS Coronavirus 2: NEGATIVE

## 2019-11-09 NOTE — Progress Notes (Signed)

## 2019-11-13 ENCOUNTER — Observation Stay (HOSPITAL_BASED_OUTPATIENT_CLINIC_OR_DEPARTMENT_OTHER)
Admission: RE | Admit: 2019-11-13 | Discharge: 2019-11-14 | Disposition: A | Discharge status: Home / Self Care | Payer: BC Managed Care – PPO | Attending: General Surgery | Admitting: General Surgery

## 2019-11-13 ENCOUNTER — Encounter (HOSPITAL_BASED_OUTPATIENT_CLINIC_OR_DEPARTMENT_OTHER): Payer: Self-pay | Admitting: General Surgery

## 2019-11-13 ENCOUNTER — Other Ambulatory Visit: Payer: Self-pay

## 2019-11-13 ENCOUNTER — Ambulatory Visit (HOSPITAL_BASED_OUTPATIENT_CLINIC_OR_DEPARTMENT_OTHER): Payer: BC Managed Care – PPO | Admitting: Anesthesiology

## 2019-11-13 ENCOUNTER — Encounter (HOSPITAL_BASED_OUTPATIENT_CLINIC_OR_DEPARTMENT_OTHER)
Admission: RE | Disposition: A | Discharge status: Home / Self Care | Payer: Self-pay | Source: Home / Self Care | Attending: General Surgery

## 2019-11-13 DIAGNOSIS — Z79810 Long term (current) use of selective estrogen receptor modulators (SERMs): Secondary | ICD-10-CM | POA: Insufficient documentation

## 2019-11-13 DIAGNOSIS — Z79899 Other long term (current) drug therapy: Secondary | ICD-10-CM | POA: Insufficient documentation

## 2019-11-13 DIAGNOSIS — L089 Local infection of the skin and subcutaneous tissue, unspecified: Secondary | ICD-10-CM | POA: Diagnosis not present

## 2019-11-13 DIAGNOSIS — Z170 Estrogen receptor positive status [ER+]: Secondary | ICD-10-CM | POA: Insufficient documentation

## 2019-11-13 DIAGNOSIS — C773 Secondary and unspecified malignant neoplasm of axilla and upper limb lymph nodes: Secondary | ICD-10-CM | POA: Insufficient documentation

## 2019-11-13 DIAGNOSIS — G8918 Other acute postprocedural pain: Secondary | ICD-10-CM | POA: Diagnosis not present

## 2019-11-13 DIAGNOSIS — L98499 Non-pressure chronic ulcer of skin of other sites with unspecified severity: Secondary | ICD-10-CM | POA: Insufficient documentation

## 2019-11-13 DIAGNOSIS — Z86000 Personal history of in-situ neoplasm of breast: Secondary | ICD-10-CM | POA: Insufficient documentation

## 2019-11-13 DIAGNOSIS — Z853 Personal history of malignant neoplasm of breast: Secondary | ICD-10-CM | POA: Diagnosis not present

## 2019-11-13 DIAGNOSIS — Z923 Personal history of irradiation: Secondary | ICD-10-CM | POA: Insufficient documentation

## 2019-11-13 DIAGNOSIS — Z9013 Acquired absence of bilateral breasts and nipples: Secondary | ICD-10-CM | POA: Diagnosis not present

## 2019-11-13 DIAGNOSIS — C50912 Malignant neoplasm of unspecified site of left female breast: Secondary | ICD-10-CM | POA: Diagnosis not present

## 2019-11-13 DIAGNOSIS — I10 Essential (primary) hypertension: Secondary | ICD-10-CM | POA: Insufficient documentation

## 2019-11-13 DIAGNOSIS — C50512 Malignant neoplasm of lower-outer quadrant of left female breast: Secondary | ICD-10-CM | POA: Diagnosis not present

## 2019-11-13 HISTORY — PX: SCAR REVISION: SHX5285

## 2019-11-13 HISTORY — PX: AXILLARY LYMPH NODE DISSECTION: SHX5229

## 2019-11-13 SURGERY — LYMPHADENECTOMY, AXILLARY
Anesthesia: General | Site: Breast | Laterality: Left

## 2019-11-13 MED ORDER — DEXAMETHASONE SODIUM PHOSPHATE 4 MG/ML IJ SOLN
INTRAMUSCULAR | Status: DC | PRN
  Administered 2019-11-13: 10 mg via INTRAVENOUS

## 2019-11-13 MED ORDER — PHENYLEPHRINE 40 MCG/ML (10ML) SYRINGE FOR IV PUSH (FOR BLOOD PRESSURE SUPPORT)
PREFILLED_SYRINGE | INTRAVENOUS | Status: AC
  Filled 2019-11-13: qty 10

## 2019-11-13 MED ORDER — PROMETHAZINE HCL 25 MG/ML IJ SOLN
INTRAMUSCULAR | Status: AC
  Filled 2019-11-13: qty 1

## 2019-11-13 MED ORDER — MORPHINE SULFATE (PF) 4 MG/ML IV SOLN: 1.0000 mg | INTRAVENOUS | Status: DC | PRN

## 2019-11-13 MED ORDER — BACITRACIN ZINC 500 UNIT/GM EX OINT
TOPICAL_OINTMENT | CUTANEOUS | Status: AC
  Filled 2019-11-13: qty 28.35

## 2019-11-13 MED ORDER — CEFAZOLIN SODIUM-DEXTROSE 2-4 GM/100ML-% IV SOLN
INTRAVENOUS | Status: AC
  Filled 2019-11-13: qty 100

## 2019-11-13 MED ORDER — EPHEDRINE 5 MG/ML INJ
INTRAVENOUS | Status: AC
  Filled 2019-11-13: qty 10

## 2019-11-13 MED ORDER — ACETAMINOPHEN 500 MG PO TABS
ORAL_TABLET | ORAL | Status: AC
  Filled 2019-11-13: qty 2

## 2019-11-13 MED ORDER — LISINOPRIL-HYDROCHLOROTHIAZIDE 20-25 MG PO TABS
1.0000 | ORAL_TABLET | Freq: Every evening | ORAL | Status: DC
  Administered 2019-11-13: 1 via ORAL

## 2019-11-13 MED ORDER — DEXAMETHASONE SODIUM PHOSPHATE 10 MG/ML IJ SOLN
INTRAMUSCULAR | Status: AC
  Filled 2019-11-13: qty 1

## 2019-11-13 MED ORDER — FENTANYL CITRATE (PF) 100 MCG/2ML IJ SOLN
50.0000 ug | INTRAMUSCULAR | Status: DC | PRN
  Administered 2019-11-13: 50 ug via INTRAVENOUS

## 2019-11-13 MED ORDER — FENTANYL CITRATE (PF) 100 MCG/2ML IJ SOLN
INTRAMUSCULAR | Status: AC
  Filled 2019-11-13: qty 2

## 2019-11-13 MED ORDER — ACETAMINOPHEN 500 MG PO TABS
1000.0000 mg | ORAL_TABLET | Freq: Four times a day (QID) | ORAL | Status: DC
  Administered 2019-11-13 – 2019-11-14 (×3): 1000 mg via ORAL
  Filled 2019-11-13 (×3): qty 2

## 2019-11-13 MED ORDER — GABAPENTIN 100 MG PO CAPS
100.0000 mg | ORAL_CAPSULE | ORAL | Status: AC
  Administered 2019-11-13: 100 mg via ORAL

## 2019-11-13 MED ORDER — SODIUM CHLORIDE 0.9 % IV SOLN: INTRAVENOUS | Status: DC

## 2019-11-13 MED ORDER — GLYCOPYRROLATE PF 0.2 MG/ML IJ SOSY
PREFILLED_SYRINGE | INTRAMUSCULAR | Status: AC
  Filled 2019-11-13: qty 1

## 2019-11-13 MED ORDER — LACTATED RINGERS IV SOLN: INTRAVENOUS | Status: DC

## 2019-11-13 MED ORDER — MIDAZOLAM HCL 2 MG/2ML IJ SOLN
INTRAMUSCULAR | Status: AC
  Filled 2019-11-13: qty 2

## 2019-11-13 MED ORDER — PROMETHAZINE HCL 25 MG/ML IJ SOLN: 12.5000 mg | INTRAMUSCULAR | Status: DC | PRN

## 2019-11-13 MED ORDER — BUPIVACAINE HCL (PF) 0.25 % IJ SOLN
INTRAMUSCULAR | Status: DC | PRN
  Administered 2019-11-13: 2 mL

## 2019-11-13 MED ORDER — SCOPOLAMINE 1 MG/3DAYS TD PT72
1.0000 | MEDICATED_PATCH | TRANSDERMAL | Status: DC
  Administered 2019-11-13: 1.5 mg via TRANSDERMAL

## 2019-11-13 MED ORDER — METOCLOPRAMIDE HCL 5 MG/ML IJ SOLN: 10.0000 mg | Freq: Once | INTRAMUSCULAR | Status: DC | PRN

## 2019-11-13 MED ORDER — ONDANSETRON HCL 4 MG/2ML IJ SOLN
INTRAMUSCULAR | Status: AC
  Filled 2019-11-13: qty 2

## 2019-11-13 MED ORDER — METHOCARBAMOL 500 MG PO TABS
500.0000 mg | ORAL_TABLET | Freq: Three times a day (TID) | ORAL | Status: DC
  Administered 2019-11-13 (×2): 500 mg via ORAL
  Filled 2019-11-13 (×2): qty 1

## 2019-11-13 MED ORDER — SIMETHICONE 80 MG PO CHEW: 40.0000 mg | CHEWABLE_TABLET | Freq: Four times a day (QID) | ORAL | Status: DC | PRN

## 2019-11-13 MED ORDER — BUPIVACAINE LIPOSOME 1.3 % IJ SUSP
INTRAMUSCULAR | Status: DC | PRN
  Administered 2019-11-13: 10 mL via PERINEURAL

## 2019-11-13 MED ORDER — OXYCODONE HCL 5 MG PO TABS: 5.0000 mg | ORAL_TABLET | Freq: Once | ORAL | Status: DC | PRN

## 2019-11-13 MED ORDER — PROPOFOL 10 MG/ML IV BOLUS
INTRAVENOUS | Status: AC
  Filled 2019-11-13: qty 20

## 2019-11-13 MED ORDER — GABAPENTIN 100 MG PO CAPS
ORAL_CAPSULE | ORAL | Status: AC
  Filled 2019-11-13: qty 1

## 2019-11-13 MED ORDER — ONDANSETRON HCL 4 MG/2ML IJ SOLN: 4.0000 mg | Freq: Four times a day (QID) | INTRAMUSCULAR | Status: DC | PRN

## 2019-11-13 MED ORDER — FENTANYL CITRATE (PF) 100 MCG/2ML IJ SOLN: 25.0000 ug | INTRAMUSCULAR | Status: DC | PRN

## 2019-11-13 MED ORDER — ONDANSETRON HCL 4 MG/2ML IJ SOLN
INTRAMUSCULAR | Status: DC | PRN
  Administered 2019-11-13: 4 mg via INTRAVENOUS

## 2019-11-13 MED ORDER — LIDOCAINE 2% (20 MG/ML) 5 ML SYRINGE
INTRAMUSCULAR | Status: DC | PRN
  Administered 2019-11-13: 50 mg via INTRAVENOUS

## 2019-11-13 MED ORDER — OXYCODONE HCL 5 MG PO TABS: 5.0000 mg | ORAL_TABLET | ORAL | Status: DC | PRN

## 2019-11-13 MED ORDER — CEFAZOLIN SODIUM-DEXTROSE 2-4 GM/100ML-% IV SOLN
2.0000 g | INTRAVENOUS | Status: AC
  Administered 2019-11-13: 2 g via INTRAVENOUS

## 2019-11-13 MED ORDER — BUPIVACAINE-EPINEPHRINE (PF) 0.5% -1:200000 IJ SOLN
INTRAMUSCULAR | Status: DC | PRN
  Administered 2019-11-13: 20 mL via PERINEURAL

## 2019-11-13 MED ORDER — LIDOCAINE 2% (20 MG/ML) 5 ML SYRINGE
INTRAMUSCULAR | Status: AC
  Filled 2019-11-13: qty 5

## 2019-11-13 MED ORDER — OXYCODONE HCL 5 MG/5ML PO SOLN: 5.0000 mg | Freq: Once | ORAL | Status: DC | PRN

## 2019-11-13 MED ORDER — MIDAZOLAM HCL 2 MG/2ML IJ SOLN
1.0000 mg | INTRAMUSCULAR | Status: DC | PRN
  Administered 2019-11-13: 2 mg via INTRAVENOUS

## 2019-11-13 MED ORDER — MEPERIDINE HCL 25 MG/ML IJ SOLN: 6.2500 mg | INTRAMUSCULAR | Status: DC | PRN

## 2019-11-13 MED ORDER — ACETAMINOPHEN 500 MG PO TABS
1000.0000 mg | ORAL_TABLET | ORAL | Status: AC
  Administered 2019-11-13: 1000 mg via ORAL

## 2019-11-13 MED ORDER — FENTANYL CITRATE (PF) 100 MCG/2ML IJ SOLN
INTRAMUSCULAR | Status: DC | PRN
  Administered 2019-11-13 (×4): 25 ug via INTRAVENOUS
  Administered 2019-11-13: 50 ug via INTRAVENOUS

## 2019-11-13 MED ORDER — ENSURE PRE-SURGERY PO LIQD: 296.0000 mL | Freq: Once | ORAL | Status: DC

## 2019-11-13 MED ORDER — MIDAZOLAM HCL 5 MG/5ML IJ SOLN
INTRAMUSCULAR | Status: DC | PRN
  Administered 2019-11-13: 2 mg via INTRAVENOUS

## 2019-11-13 MED ORDER — KETOROLAC TROMETHAMINE 15 MG/ML IJ SOLN: 15.0000 mg | Freq: Four times a day (QID) | INTRAMUSCULAR | Status: DC | PRN

## 2019-11-13 MED ORDER — PROPOFOL 10 MG/ML IV BOLUS
INTRAVENOUS | Status: DC | PRN
  Administered 2019-11-13: 150 mg via INTRAVENOUS
  Administered 2019-11-13: 50 mg via INTRAVENOUS

## 2019-11-13 MED ORDER — ONDANSETRON 4 MG PO TBDP: 4.0000 mg | ORAL_TABLET | Freq: Four times a day (QID) | ORAL | Status: DC | PRN

## 2019-11-13 SURGICAL SUPPLY — 58 items
APPLIER CLIP 11 MED OPEN (CLIP)
APPLIER CLIP 9.375 MED OPEN (MISCELLANEOUS) ×4
BIOPATCH RED 1 DISK 7.0 (GAUZE/BANDAGES/DRESSINGS) ×2 IMPLANT
BLADE SURG 15 STRL LF DISP TIS (BLADE) ×1 IMPLANT
BLADE SURG 15 STRL SS (BLADE) ×2
CANISTER SUCT 1200ML W/VALVE (MISCELLANEOUS) ×2 IMPLANT
CHLORAPREP W/TINT 26 (MISCELLANEOUS) ×2 IMPLANT
CLIP APPLIE 11 MED OPEN (CLIP) IMPLANT
CLIP APPLIE 9.375 MED OPEN (MISCELLANEOUS) ×2 IMPLANT
COVER BACK TABLE 60X90IN (DRAPES) ×2 IMPLANT
COVER MAYO STAND STRL (DRAPES) ×2 IMPLANT
DECANTER SPIKE VIAL GLASS SM (MISCELLANEOUS) IMPLANT
DERMABOND ADVANCED (GAUZE/BANDAGES/DRESSINGS) ×1
DERMABOND ADVANCED .7 DNX12 (GAUZE/BANDAGES/DRESSINGS) ×1 IMPLANT
DRAIN CHANNEL 19F RND (DRAIN) ×2 IMPLANT
DRAPE TOP ARMCOVERS (MISCELLANEOUS) ×2 IMPLANT
DRAPE U-SHAPE 76X120 STRL (DRAPES) ×2 IMPLANT
DRAPE UTILITY XL STRL (DRAPES) ×2 IMPLANT
DRSG TEGADERM 4X4.75 (GAUZE/BANDAGES/DRESSINGS) ×4 IMPLANT
ELECT COATED BLADE 2.86 ST (ELECTRODE) ×2 IMPLANT
ELECT REM PT RETURN 9FT ADLT (ELECTROSURGICAL) ×2
ELECTRODE REM PT RTRN 9FT ADLT (ELECTROSURGICAL) ×1 IMPLANT
EVACUATOR SILICONE 100CC (DRAIN) ×2 IMPLANT
GAUZE SPONGE 4X4 12PLY STRL LF (GAUZE/BANDAGES/DRESSINGS) IMPLANT
GLOVE BIO SURGEON STRL SZ 6.5 (GLOVE) ×4 IMPLANT
GLOVE BIO SURGEON STRL SZ7 (GLOVE) ×2 IMPLANT
GLOVE BIOGEL PI IND STRL 7.0 (GLOVE) ×1 IMPLANT
GLOVE BIOGEL PI IND STRL 7.5 (GLOVE) ×1 IMPLANT
GLOVE BIOGEL PI INDICATOR 7.0 (GLOVE) ×1
GLOVE BIOGEL PI INDICATOR 7.5 (GLOVE) ×1
GOWN STRL REUS W/ TWL LRG LVL3 (GOWN DISPOSABLE) ×3 IMPLANT
GOWN STRL REUS W/TWL LRG LVL3 (GOWN DISPOSABLE) ×6
HEMOSTAT ARISTA ABSORB 1G (HEMOSTASIS) ×2 IMPLANT
HEMOSTAT ARISTA ABSORB 3G PWDR (HEMOSTASIS) IMPLANT
NEEDLE HYPO 25X1 1.5 SAFETY (NEEDLE) ×2 IMPLANT
NS IRRIG 1000ML POUR BTL (IV SOLUTION) ×2 IMPLANT
PACK BASIN DAY SURGERY FS (CUSTOM PROCEDURE TRAY) ×2 IMPLANT
PENCIL SMOKE EVACUATOR (MISCELLANEOUS) ×2 IMPLANT
PIN SAFETY STERILE (MISCELLANEOUS) ×2 IMPLANT
RETRACTOR ONETRAX LX 90X20 (MISCELLANEOUS) IMPLANT
SLEEVE SCD COMPRESS KNEE MED (MISCELLANEOUS) ×2 IMPLANT
SPONGE LAP 4X18 RFD (DISPOSABLE) ×2 IMPLANT
STRIP CLOSURE SKIN 1/2X4 (GAUZE/BANDAGES/DRESSINGS) ×2 IMPLANT
SUT ETHILON 2 0 FS 18 (SUTURE) ×2 IMPLANT
SUT ETHILON 3 0 PS 1 (SUTURE) ×6 IMPLANT
SUT MNCRL AB 4-0 PS2 18 (SUTURE) ×4 IMPLANT
SUT SILK 2 0 SH (SUTURE) ×2 IMPLANT
SUT VIC AB 2-0 SH 27 (SUTURE) ×2
SUT VIC AB 2-0 SH 27XBRD (SUTURE) ×1 IMPLANT
SUT VIC AB 3-0 SH 27 (SUTURE) ×4
SUT VIC AB 3-0 SH 27X BRD (SUTURE) ×2 IMPLANT
SUT VICRYL AB 2 0 TIE (SUTURE) IMPLANT
SUT VICRYL AB 2 0 TIES (SUTURE)
SUT VICRYL AB 3 0 TIES (SUTURE) IMPLANT
SYR CONTROL 10ML LL (SYRINGE) ×2 IMPLANT
TOWEL GREEN STERILE FF (TOWEL DISPOSABLE) ×2 IMPLANT
TUBE CONNECTING 20X1/4 (TUBING) ×2 IMPLANT
YANKAUER SUCT BULB TIP NO VENT (SUCTIONS) ×2 IMPLANT

## 2019-11-13 NOTE — Anesthesia Procedure Notes (Signed)
Anesthesia Regional Block: Pectoralis block   Pre-Anesthetic Checklist: ,, timeout performed, Correct Patient, Correct Site, Correct Laterality, Correct Procedure, Correct Position, site marked, Risks and benefits discussed,  Surgical consent,  Pre-op evaluation,  At surgeon's request and post-op pain management  Laterality: Left  Prep: chloraprep       Needles:  Injection technique: Single-shot  Needle Type: Echogenic Stimulator Needle     Needle Length: 9cm  Needle Gauge: 21   Needle insertion depth: 7 cm   Additional Needles:   Procedures:,,,, ultrasound used (permanent image in chart),,,,  Narrative:  Start time: 11/13/2019 8:20 AM End time: 11/13/2019 8:25 AM Injection made incrementally with aspirations every 5 mL.  Performed by: Personally  Anesthesiologist: Josephine Igo, MD  Additional Notes: Timeout performed. Patient sedated. Relevant anatomy ID'd using [place]. Incremental 2-24ml injection of LA with frequent aspiration. Patient tolerated procedure well.        Left Pectoralis Block

## 2019-11-13 NOTE — Transfer of Care (Signed)
Immediate Anesthesia Transfer of Care Note  Patient: Natasha Graham  Procedure(s) Performed: EXCISION OF LEFT MASTECTOMY SKIN WITH LEFT AXILLARY LYMPH NODE DISSECTION (Left Breast)  Patient Location: PACU  Anesthesia Type:GA combined with regional for post-op pain  Level of Consciousness: awake and patient cooperative  Airway & Oxygen Therapy: Patient Spontanous Breathing and Patient connected to face mask oxygen  Post-op Assessment: Report given to RN and Post -op Vital signs reviewed and stable  Post vital signs: Reviewed and stable  Last Vitals:  Vitals Value Taken Time  BP 124/65 11/13/19 1009  Temp    Pulse 86 11/13/19 1010  Resp 15 11/13/19 1010  SpO2 100 % 11/13/19 1010  Vitals shown include unvalidated device data.  Last Pain:  Vitals:   11/13/19 0727  TempSrc: Oral  PainSc: 0-No pain         Complications: No apparent anesthesia complications

## 2019-11-13 NOTE — Anesthesia Procedure Notes (Signed)
Procedure Name: LMA Insertion Date/Time: 11/13/2019 8:47 AM Performed by: Marrianne Mood, CRNA Pre-anesthesia Checklist: Patient identified, Emergency Drugs available, Suction available, Patient being monitored and Timeout performed Patient Re-evaluated:Patient Re-evaluated prior to induction Oxygen Delivery Method: Circle system utilized Preoxygenation: Pre-oxygenation with 100% oxygen Induction Type: IV induction Ventilation: Mask ventilation without difficulty LMA: LMA inserted LMA Size: 4.0 Number of attempts: 1 Airway Equipment and Method: Bite block Placement Confirmation: positive ETCO2 Tube secured with: Tape Dental Injury: Teeth and Oropharynx as per pre-operative assessment

## 2019-11-13 NOTE — Interval H&P Note (Signed)
History and Physical Interval Note:  11/13/2019 8:32 AM  Natasha Graham  has presented today for surgery, with the diagnosis of LEFT BREAST CANCER.  The various methods of treatment have been discussed with the patient and family. After consideration of risks, benefits and other options for treatment, the patient has consented to  Procedure(s) with comments: EXCISION OF LEFT MASTECTOMY SKIN WITH LEFT AXILLARY LYMPH NODE DISSECTION (Left) - PEC BLOCK as a surgical intervention.  The patient's history has been reviewed, patient examined, no change in status, stable for surgery.  I have reviewed the patient's chart and labs.  Questions were answered to the patient's satisfaction.     Rolm Bookbinder

## 2019-11-13 NOTE — Anesthesia Postprocedure Evaluation (Signed)
Anesthesia Post Note  Patient: Natasha Graham  Procedure(s) Performed: LEFT AXILLARY LYMPH NODE DISSECTION (Left Breast) EXCISION OF LEFT MASTECTOMY SKIN (Left Breast)     Patient location during evaluation: PACU Anesthesia Type: General Level of consciousness: awake and alert and oriented Pain management: pain level controlled Vital Signs Assessment: post-procedure vital signs reviewed and stable Respiratory status: spontaneous breathing, nonlabored ventilation and respiratory function stable Cardiovascular status: blood pressure returned to baseline and stable Postop Assessment: no apparent nausea or vomiting Anesthetic complications: no    Last Vitals:  Vitals:   11/13/19 1015 11/13/19 1030  BP: 116/71 132/79  Pulse: 84 78  Resp: 10 11  Temp:    SpO2: 100% 100%    Last Pain:  Vitals:   11/13/19 1030  TempSrc:   PainSc: 0-No pain                 Lanessa Shill A.

## 2019-11-13 NOTE — H&P (Signed)
Natasha Graham is an 59 y.o. female.   Chief Complaint: breast cancer HPI:  58 yof referred by Dr Ronita Hipps for new left breast cancer. she has history of right nsm for er/pr pos dcis in 2015. she declined antiestrogen therapy at that time for risk reduction. she has done well since then. she has no mass or dc now. she completed recon and has been happy. she underwent screening mm that shows b density breasts. she has a 1.7 cm mass by mm and then a 2.1 cm mass by [place]. axillary [place] was negative. core biopsy is ILC grade I that is er pos, pr neg, Her 2 negative and Ki is 1%. she underwent a nsm with sn biopsy and expander reconstruction.  Her pathology shows a much larger 6.2 cm ILC grade II with lcis that involves the anterior skin focally (I have discussed with pathology and know where this is), small focus dcis, nipple negative, 2 nodes with lobular carcinoma present.  She has low mammaprint.  No evidence stage IV disease and I have discussed plan with med onc and rad onc now.   Past Medical History:  Diagnosis Date  . Cancer [facility])    breast cancer  . Hypertension   . PONV (postoperative nausea and vomiting)   . Scoliosis   . Wears glasses     Past Surgical History:  Procedure Laterality Date  . AUGMENTATION MAMMAPLASTY Right    2015 post mastectomy  . BREAST RECONSTRUCTION WITH PLACEMENT OF TISSUE EXPANDER AND FLEX HD (ACELLULAR HYDRATED DERMIS) Right 05/16/2014   Procedure: IMMEDIATE RIGHT BREAST RECONSTRUCTION WITH PLACEMENT OF TISSUE EXPANDER AND FLEX HD (ACELLULAR HYDRATED DERMIS);  Surgeon: Theodoro Kos, DO;  Location: [place];  Service: Plastics;  Laterality: Right;  . BREAST RECONSTRUCTION WITH PLACEMENT OF TISSUE EXPANDER AND FLEX HD (ACELLULAR HYDRATED DERMIS) Left 09/27/2019   Procedure: LEFT BREAST RECONSTRUCTION WITH PLACEMENT OF TISSUE EXPANDER AND FLEX HD (ACELLULAR HYDRATED DERMIS);  Surgeon: Wallace Going, DO;  Location: [place];  Service: Plastics;   Laterality: Left;  . BREAST SURGERY     right breast excisional biopsy  . DILATION AND CURETTAGE OF UTERUS    . HYSTEROSCOPY WITH D & C N/A 09/20/2015   Procedure: DILATATION AND CURETTAGE /HYSTEROSCOPY;  Surgeon: Brien Few, MD;  Location: [place];  Service: Gynecology;  Laterality: N/A;  . LIPOSUCTION Bilateral 08/08/2014   Procedure: LIPO SUCTION ;  Surgeon: Theodoro Kos, DO;  Location: [place];  Service: Plastics;  Laterality: Bilateral;  . MASTECTOMY Right 05/16/2014   placement of acellular dermal matrix & tissue expanders   . MASTOPEXY Left 08/08/2014   Procedure:  MASTOPEXY FOR SYMMETRY;  Surgeon: Theodoro Kos, DO;  Location: [place];  Service: Plastics;  Laterality: Left;  . NIPPLE SPARING MASTECTOMY WITH SENTINEL LYMPH NODE BIOPSY Left 09/27/2019   Procedure: LEFT NIPPLE SPARING MASTECTOMY WITH LEFT AXILLARY SENTINEL LYMPH NODE BIOPSY;  Surgeon: Rolm Bookbinder, MD;  Location: [place];  Service: General;  Laterality: Left;  . REDUCTION MAMMAPLASTY Left    2015  . REMOVAL OF TISSUE EXPANDER AND PLACEMENT OF IMPLANT Right 08/08/2014   Procedure: REMOVAL OF RIGHT  TISSUE EXPANDERS WITH PLACEMENT OF RIGHT BREAST IMPLANTS WITH LIPO SUCTION ;  Surgeon: Theodoro Kos, DO;  Location: [place];  Service: Plastics;  Laterality: Right;  . scoliosis  1972   harrington rods-age 11  . TONSILLECTOMY      Family History  Problem  Relation Age of Onset  . Cancer Father        liver  . Breast cancer Paternal Aunt    Social History:  reports that she has never smoked. She has never used smokeless tobacco. She reports that she does not drink alcohol or use drugs.  Allergies: No Known Allergies  Medications Prior to Admission  Medication Sig Dispense Refill  . Ascorbic Acid (VITAMIN C) 100 MG tablet Take 100 mg by mouth daily.    . Flaxseed Oil (LINSEED OIL) OIL by Misc.(Non-Drug; Combo Route) route.    . Ginger,  Zingiber officinalis, (GINGER PO) Take by mouth.    Marland Kitchen lisinopril-hydrochlorothiazide (PRINZIDE,ZESTORETIC) 20-25 MG per tablet Take 1 tablet by mouth every evening.     . Multiple Vitamin (MULTIVITAMIN) capsule Take by mouth.    . Pediatric Multivitamins-Fl (MULTI VIT/FL PO) Take by mouth.    . Turmeric (QC TUMERIC COMPLEX) 500 MG CAPS Take by mouth.    . tamoxifen (NOLVADEX) 20 MG tablet Take 1 tablet (20 mg total) by mouth daily. To start after completion of radiation therapy 90 tablet 4    No results found for this or any previous visit (from the past 48 hour(s)). No results found.  Review of Systems  All other systems reviewed and are negative.   Blood pressure (!) 157/76, pulse 82, temperature (!) 97.5 F (36.4 C), temperature source Oral, resp. rate 20, height 5\' 4"  (1.626 m), weight 77 kg, last menstrual period 10/16/2013, SpO2 100 %. Physical Exam  cv rrr Lungs clear Right breast without mass Left breast healing with some epidermolysis  Assessment/Plan Left mastectomy skin excision Left alnd  Rolm Bookbinder, MD 11/13/2019, 8:27 AM

## 2019-11-13 NOTE — Anesthesia Preprocedure Evaluation (Signed)
Anesthesia Evaluation  Patient identified by MRN, date of birth, ID band Patient awake    Reviewed: Allergy & Precautions, NPO status , Patient's Chart, lab work & pertinent test results  History of Anesthesia Complications (+) PONV and history of anesthetic complications  Airway Mallampati: III  TM Distance: >3 FB Neck ROM: Full    Dental no notable dental hx. (+) Teeth Intact   Pulmonary neg pulmonary ROS,    Pulmonary exam normal breath sounds clear to auscultation       Cardiovascular hypertension, Pt. on medications Normal cardiovascular exam Rhythm:Regular Rate:Normal     Neuro/Psych negative neurological ROS  negative psych ROS   GI/Hepatic negative GI ROS, Neg liver ROS,   Endo/Other  Left breast Ca  Renal/GU negative Renal ROS  negative genitourinary   Musculoskeletal negative musculoskeletal ROS (+)   Abdominal (+) - obese,   Peds  Hematology negative hematology ROS (+)   Anesthesia Other Findings   Reproductive/Obstetrics                             Anesthesia Physical Anesthesia Plan  ASA: II  Anesthesia Plan: General   Post-op Pain Management:  Regional for Post-op pain   Induction: Intravenous  PONV Risk Score and Plan: 4 or greater and Ondansetron, Dexamethasone, Midazolam, Scopolamine patch - Pre-op and Treatment may vary due to age or medical condition  Airway Management Planned: LMA  Additional Equipment:   Intra-op Plan:   Post-operative Plan: Extubation in OR  Informed Consent: I have reviewed the patients History and Physical, chart, labs and discussed the procedure including the risks, benefits and alternatives for the proposed anesthesia with the patient or authorized representative who has indicated his/her understanding and acceptance.     Dental advisory given  Plan Discussed with: CRNA, Surgeon and Anesthesiologist  Anesthesia Plan Comments:          Anesthesia Quick Evaluation

## 2019-11-13 NOTE — Progress Notes (Signed)
Assisted Dr. Royce Macadamia with left, ultrasound guided, pectoralis block. Side rails up, monitors on throughout procedure. See vital signs in flow sheet. Tolerated Procedure well.

## 2019-11-13 NOTE — Op Note (Signed)
Preoperative diagnosis: left breast invasive lobular cancer s/p nsm with positive anterior margin, node positive Postoperative diagnosis: saa Procedure: 1. Excision mastectomy margin, 1.5x3 cm skin  2. Left alnd Surgeon Dr Serita Grammes Asst: Carlena Hurl, PA-C EBL: 20 cc Anesthesia general with pec block Specimens 1. Left mastectomy skin marked short superior, long lateral 2. Left axillary nodes Complications none Drains 19 Fr Blake drain to left axilla Sponge and needle count correct dispo recovery stable  Indications: 55 yof referred by Dr Ronita Hipps for new left breast cancer. she has history of right nsm for er/pr pos dcis in 2015. she declined antiestrogen therapy at that time for risk reduction. she has done well since then. she has no mass or dc now. she completed recon and has been happy. she underwent screening mm that shows b density breasts. she has a 1.7 cm mass by mm and then a 2.1 cm mass by [place]. axillary [place] was negative. core biopsy is ILC grade I that is er pos, pr neg, Her 2 negative and Ki is 1%. she underwent a nsm with sn biopsy and expander reconstruction.  Her pathology shows a much larger 6.2 cm ILC grade II with lcis that involves the anterior skin focally (I have discussed with pathology and know where this is), small focus dcis, nipple negative, 2 nodes with lobular carcinoma present.  She has low mammaprint.  No evidence stage IV disease and I have discussed plan with med onc and rad onc now.   Procedure: After informed consent was obtained patient first underwent a pectoral block.  She was given antibiotics. SCDs were in place.  She was placed under general anesthesia without complication. She was prepped and draped in standard sterile surgical fashion. A timeout was performed.  I first marked the area on the mastectomy skin where I knew the margin was positive.   I excised a 1.5x3 cm area of skin. I then closed this with 3-0 nylon suture. Bacitracin and dressing  was placed. This was marked as above.  I then made a left axillary incision below the hairline. I dissected through the axillary fascia and entered the axilla. There was scarring present from sn biopsy. I then identified the axillary vein, thoracodorsal bundle and the long thoracic nerve. These were all preserved. I swept the nodal tissue caudad all the way to the muscle and removed it in total.  I passed this off. Hemostasis was observed. I then placed a 19 Fr Blake drain and secured this with a 2-0 nylon suture. I closed the axilla with 2-0 vicryl, 3-0 vicryl and 4-0 monocryl. Glue and steristrips were applied. Dressings were placed. She tolerated well, was extubated and transferred to pacu stable.

## 2019-11-14 ENCOUNTER — Encounter: Payer: Self-pay | Admitting: *Deleted

## 2019-11-14 NOTE — Discharge Summary (Signed)
Physician Discharge Summary  Patient ID: Natasha Graham MRN: [id number] DOB/AGE: 1961-01-10 59 y.o.  Admit date: 11/13/2019 Discharge date: 11/14/2019  Admission Diagnoses: Left breast cancer Right breast cancer Hypertension  Discharge Diagnoses:  Active Problems:   Breast cancer, left breast [facility])   Discharged Condition: good  Hospital Course: 47 yof with history right breast cancer recently with left breast cancer. Underwent nsm with much larger Caroline found at surgery with positive nodes.  She had positive margin at mastectomy that I have now reexcised and she underwent an axillary node dissection yesterday. She is doing well with expected drain output, ready for dc  Consults: none  Significant Diagnostic Studies: none  Treatments: surgery: see above  Discharge Exam: Blood pressure 136/71, pulse 67, temperature 97.6 F (36.4 C), resp. rate 16, height 5\' 4"  (1.626 m), weight 77 kg, last menstrual period 10/16/2013, SpO2 97 %. dressings intact, drain serosang  Disposition: Discharge disposition: 01-Home or Self Care        Allergies as of 11/14/2019   No Known Allergies     Medication List    STOP taking these medications   tamoxifen 20 MG tablet Commonly known as: NOLVADEX     TAKE these medications   GINGER PO Take by mouth.   Linseed Oil Oil by Misc.(Non-Drug; Combo Route) route.   lisinopril-hydrochlorothiazide 20-25 MG tablet Commonly known as: ZESTORETIC Take 1 tablet by mouth every evening.   MULTI VIT/FL PO Take by mouth.   multivitamin capsule Take by mouth.   QC Tumeric Complex 500 MG Caps Generic drug: Turmeric Take by mouth.   vitamin C 100 MG tablet Take 100 mg by mouth daily.      Follow-up Information    Rolm Bookbinder, MD In 1 week.   Specialty: General Surgery Contact information: [address] [place] [phone]           Signed: Rolm Bookbinder 11/14/2019, 7:06 AM

## 2019-11-14 NOTE — Discharge Instructions (Signed)
[facility], [place] [phone]  POST OP INSTRUCTIONS Take 400 mg of ibuprofen every 8 hours or 650 mg tylenol every 6 hours for next 72 hours then as needed. Use ice several times daily also. Always review your discharge instruction sheet given to you by the facility where your surgery was performed. IF YOU HAVE DISABILITY OR FAMILY LEAVE FORMS, YOU MUST BRING THEM TO THE OFFICE FOR PROCESSING.   DO NOT GIVE THEM TO YOUR DOCTOR. A prescription for pain medication may be given to you upon discharge.  Take your pain medication as prescribed, if needed.  If narcotic pain medicine is not needed, then you may take acetaminophen (Tylenol), naprosyn (Alleve) or ibuprofen (Advil) as needed. 1. Take your usually prescribed medications unless otherwise directed. 2. If you need a refill on your pain medication, please contact your pharmacy.  They will contact our office to request authorization.  Prescriptions will not be filled after 5pm or on week-ends. 3. You should follow a light diet the first few days after arrival home, such as soup and crackers, etc.  Resume your normal diet the day after surgery. 4. Most patients will experience some swelling and bruising on the chest and underarm.  Ice packs will help.  Swelling and bruising can take several days to resolve. Wear the binder day and night until you return to the office.  5. It is common to experience some constipation if taking pain medication after surgery.  Increasing fluid intake and taking a stool softener (such as Colace) will usually help or prevent this problem from occurring.  A mild laxative (Milk of Magnesia or Miralax) should be taken according to package instructions if there are no bowel movements after 48 hours. 6. Unless discharge instructions indicate otherwise, leave your bandage dry and in place until your next appointment in 3-5 days.  You may take a limited sponge bath.  No tube baths or showers until the drains are  removed.  You may have steri-strips (small skin tapes) in place directly over the incision.  These strips should be left on the skin for 7-10 days. If you have glue it will come off in next couple week.  Any sutures will be removed at an office visit 7. DRAINS:  If you have drains in place, it is important to keep a list of the amount of drainage produced each day in your drains.  Before leaving the hospital, you should be instructed on drain care.  Call our office if you have any questions about your drains. I will remove your drains when they put out less than 30 cc or ml for 2 consecutive days. 8. ACTIVITIES:  You may resume regular (light) daily activities beginning the next day--such as daily self-care, walking, climbing stairs--gradually increasing activities as tolerated.  You may have sexual intercourse when it is comfortable.  Refrain from any heavy lifting or straining until approved by your doctor. a. You may drive when you are no longer taking prescription pain medication, you can comfortably wear a seatbelt, and you can safely maneuver your car and apply brakes. b. RETURN TO WORK:  __________________________________________________________ 9. You should see your doctor in the office for a follow-up appointment approximately 3-5 days after your surgery.  Your doctor's nurse will typically make your follow-up appointment when she calls you with your pathology report.  Expect your pathology report 3-4business days after surgery. 10. OTHER INSTRUCTIONS: ______________________________________________________________________________________________ ____________________________________________________________________________________________ WHEN TO CALL YOUR DR [place]: 1. Fever over 101.0 2. Nausea  and/or vomiting 3. Extreme swelling or bruising 4. Continued bleeding from incision. 5. Increased pain, redness, or drainage from the incision. The clinic staff is available to answer your questions  during regular business hours.  Please don't hesitate to call and ask to speak to one of the nurses for clinical concerns.  If you have a medical emergency, go to the nearest emergency room or call 911.  A surgeon from [facility] is always on call at the hospital. [address], [place], [place], [place]  [address] ? [address], [place], [place]   [address] [phone] ? [phone] ? FAX [phone] Web site: [email or web address]  Post Anesthesia Home Care Instructions  Activity: Get plenty of rest for the remainder of the day. A responsible individual must stay with you for 24 hours following the procedure.  For the next 24 hours, DO NOT: -Drive a car -Paediatric nurse -Drink alcoholic beverages -Take any medication unless instructed by your physician -Make any legal decisions or sign important papers.  Meals: Start with liquid foods such as gelatin or soup. Progress to regular foods as tolerated. Avoid greasy, spicy, heavy foods. If nausea and/or vomiting occur, drink only clear liquids until the nausea and/or vomiting subsides. Call your physician if vomiting continues.  Special Instructions/Symptoms: Your throat may feel dry or sore from the anesthesia or the breathing tube placed in your throat during surgery. If this causes discomfort, gargle with warm salt water. The discomfort should disappear within 24 hours.  If you had a scopolamine patch placed behind your ear for the management of post- operative nausea and/or vomiting:  1. The medication in the patch is effective for 72 hours, after which it should be removed.  Wrap patch in a tissue and discard in the trash. Wash hands thoroughly with soap and water. 2. You may remove the patch earlier than 72 hours if you experience unpleasant side effects which may include dry mouth, dizziness or visual disturbances. 3. Avoid touching the patch. Wash your hands with soap and water after contact with  the patch.    Information for Discharge Teaching: EXPAREL (bupivacaine liposome injectable suspension)   Your surgeon or anesthesiologist gave you EXPAREL(bupivacaine) to help control your pain after surgery.   EXPAREL is a local anesthetic that provides pain relief by numbing the tissue around the surgical site.  EXPAREL is designed to release pain medication over time and can control pain for up to 72 hours.  Depending on how you respond to EXPAREL, you may require less pain medication during your recovery.  Possible side effects:  Temporary loss of sensation or ability to move in the area where bupivacaine was injected.  Nausea, vomiting, constipation  Rarely, numbness and tingling in your mouth or lips, lightheadedness, or anxiety may occur.  Call your doctor right away if you think you may be experiencing any of these sensations, or if you have other questions regarding possible side effects.  Follow all other discharge instructions given to you by your surgeon or nurse. Eat a healthy diet and drink plenty of water or other fluids.  If you return to the hospital for any reason within 96 hours following the administration of EXPAREL, it is important for health care providers to know that you have received this anesthetic. A teal colored band has been placed on your arm with the date, time and amount of EXPAREL you have received in order to alert and inform your health care providers. Please leave this armband  in place for the full 96 hours following administration, and then you may remove the band.   About my Jackson-Pratt Bulb Drain  What is a Jackson-Pratt bulb? A Jackson-Pratt is a soft, round device used to collect drainage. It is connected to a long, thin drainage catheter, which is held in place by one or two small stiches near your surgical incision site. When the bulb is squeezed, it forms a vacuum, forcing the drainage to empty into the bulb.  Emptying the Jackson-Pratt  bulb- To empty the bulb: 1. Release the plug on the top of the bulb. 2. Pour the bulb's contents into a measuring container which your nurse will provide. 3. Record the time emptied and amount of drainage. Empty the drain(s) as often as your     doctor or nurse recommends.  Date                  Time                    Amount (Drain 1)                 Amount (Drain 2)  _____________________________________________________________________  _____________________________________________________________________  _____________________________________________________________________  _____________________________________________________________________  _____________________________________________________________________  _____________________________________________________________________  _____________________________________________________________________  _____________________________________________________________________  Squeezing the Jackson-Pratt Bulb- To squeeze the bulb: 1. Make sure the plug at the top of the bulb is open. 2. Squeeze the bulb tightly in your fist. You will hear air squeezing from the bulb. 3. Replace the plug while the bulb is squeezed. 4. Use a safety pin to attach the bulb to your clothing. This will keep the catheter from     pulling at the bulb insertion site.  When to call your doctor- Call your doctor if:  Drain site becomes red, swollen or hot.  You have a fever greater than 101 degrees F.  There is oozing at the drain site.  Drain falls out (apply a guaze bandage over the drain hole and secure it with tape).  Drainage increases daily not related to activity patterns. (You will usually have more drainage when you are active than when you are resting.)  Drainage has a bad odor.

## 2019-11-15 ENCOUNTER — Other Ambulatory Visit: Payer: BC Managed Care – PPO

## 2019-11-15 ENCOUNTER — Ambulatory Visit: Payer: BC Managed Care – PPO | Admitting: Oncology

## 2019-11-15 LAB — SURGICAL PATHOLOGY

## 2019-11-17 ENCOUNTER — Ambulatory Visit: Payer: BC Managed Care – PPO | Admitting: Plastic Surgery

## 2019-11-20 ENCOUNTER — Other Ambulatory Visit: Payer: Self-pay

## 2019-11-20 ENCOUNTER — Encounter (HOSPITAL_COMMUNITY)
Admission: RE | Admit: 2019-11-20 | Discharge: 2019-11-20 | Disposition: A | Discharge status: Home / Self Care | Payer: BC Managed Care – PPO | Source: Ambulatory Visit | Attending: Oncology | Admitting: Oncology

## 2019-11-20 DIAGNOSIS — D0500 Lobular carcinoma in situ of unspecified breast: Secondary | ICD-10-CM | POA: Diagnosis not present

## 2019-11-20 DIAGNOSIS — C50512 Malignant neoplasm of lower-outer quadrant of left female breast: Secondary | ICD-10-CM | POA: Insufficient documentation

## 2019-11-20 DIAGNOSIS — Z170 Estrogen receptor positive status [ER+]: Secondary | ICD-10-CM | POA: Insufficient documentation

## 2019-11-20 DIAGNOSIS — C50912 Malignant neoplasm of unspecified site of left female breast: Secondary | ICD-10-CM | POA: Diagnosis not present

## 2019-11-20 MED ORDER — FLUOROESTRADIOL F 18 4-100 MCI/ML IV SOLN
6.5100 | Freq: Once | INTRAVENOUS | Status: AC
  Administered 2019-11-20: 6.51 via INTRAVENOUS

## 2019-11-22 ENCOUNTER — Other Ambulatory Visit: Payer: Self-pay | Admitting: *Deleted

## 2019-11-22 ENCOUNTER — Other Ambulatory Visit: Payer: Self-pay | Admitting: Oncology

## 2019-11-22 DIAGNOSIS — C50512 Malignant neoplasm of lower-outer quadrant of left female breast: Secondary | ICD-10-CM

## 2019-11-22 NOTE — Progress Notes (Signed)
[facility]  Telephone:([phone]) [phone] Fax:(336) [phone]     ID: Natasha Graham DOB: 1960/10/11  MR#: [id number]  JYN#:[id number]  Patient Care Team: Leonides Sake, MD as PCP - General (Family Medicine) Rolm Bookbinder, MD as Consulting Physician (General Surgery) Xayne Brumbaugh, Virgie Dad, MD as Consulting Physician (Oncology) Mauro Kaufmann, RN as Registered Nurse Rockwell Germany, RN as Registered Nurse Dillingham, Loel Lofty, DO as Attending Physician (Plastic Surgery) OTHER MD: Sherrie Mustache MD  CHIEF COMPLAINT: new estrogen receptor positive left breast cancer; remote noninvasive right breast cancer (s/p bilateral mastectomies)  CURRENT TREATMENT: Awaiting further surgery, radiation, and staging scans  INTERVAL HISTORY: Natasha Graham returns today to the breast cancer clinic.  I had seen her remotely when she had a right-sided noninvasive breast cancer.  She has now been found to have left sided breast cancer as detailed below.  She is here to discuss staging and systemic treatment options.  LEFT BREAST CANCER HISTORY: She had routine screening left mammogram on 06/14/2019 showing a developing asymmetry. She presented for left diagnostic mammogram and left breast ultrasound on 07/07/2019. Physical exam that day showed a palpable, firm, superficial mass in the left breast at 6 o'clock along the site of reduction mammoplasty scar (performed in 2015 with right mastectomy). Scans showed: breast density category B; 2.1 cm superficial mass involving the skin in the left breast at 6 o'clock; no enlarged adenopathy in the left axilla.   Accordingly on 07/12/2019 she proceeded to biopsy of the left breast area in question. The pathology from this procedure ([id number]) showed: invasive lobular carcinoma, grade 1, with perineural invasion present. Prognostic indicators significant for: estrogen receptor, 60% positive with moderate staining intensity and progesterone receptor, 0% negative.  Proliferation marker Ki67 at <1%. HER2 equvocal by immunohistochemistry (2+), but negative by fluorescent in situ hybridization with a signals ratio 1.18 and number per cell 1.95.  She opted to proceed with left mastectomy on 09/27/2019 under Dr. Donne Hazel with immediate reconstruction under Dr. Marla Roe. Pathology from the procedure [id number]) revealed: invasive lobular carcinoma, grade 2, 6.2 cm, focally involving anterior margin and involving skin dermis without involvement of epidermis; lobular carcinoma in situ with pagetoid spread; small focus of low grade ductal carcinoma in situ, 0.2 cm.  Two lymph nodes were biopsied. One showed metastatic lobular carcinoma with focus measuring 2.1 cm with evidence of extranodal extension. The other lymph node also showed metastatic lobular carcinoma with no lymphoid tissue present.  Biopsy of the nipple was also taken at that time and showed no evidence of invasive carcinoma.  Mammaprint was performed on the final surgical sample. This returned showing low risk.   REVIEW OF SYSTEMS: Natasha Graham did well with her surgery, with minimal pain, no significant problems with bleeding or fever.  She had her drain in for about 2 weeks.  She has an expander in place and view of eventual saline reconstruction.  She denies unusual headaches visual changes cough phlegm production pleurisy or any significant change in bowel or bladder habits.  A detailed review of systems today was otherwise stable.   RIGHT BREAST CANCER HISTORY:  Natasha Graham has a history of right breast biopsy in late 2007 showing atypical ductal hyperplasia in the upper outer right breast.. Breast MRI 07/13/2006 showed postbiopsy changes in the upper right breast, and some right breast cysts. There were no solid masses however or areas of abnormal enhancement. There were no enlarged lymph nodes. Incidental finding of liver cysts was made. On 07/30/2006 she underwent needle  localization of the  calcifications in the outer right breast which also showed ([id number]) atypical ductal hyperplasia, with no malignancy.   On 10/21/2012 routine digital bilateral screening mammography at the breast Center showed breast density to be category C. In the right breast there were calcifications and architectural distortion to go with skin retraction. Additional views obtained 11/17/2012 showed extensive but stable punctate microcalcifications in the upper outer right breast. Many of these were superficial and not amenable to stereotactic biopsy. MRI was suggested but the patient was unable to undergo that test and therefore six-month mammography was obtained 05/15/2013. This showed the calcifications in the outer right breast to have been stable. Because of their extents and because of their superficial location they could not be all sampled all removed with biopsy. MRI was again discussed, but the patient has Harrington rods in place and also felt the cost of MRI was prohibitive.  On 11/13/2013 further six-month follow-up showed a grouping of intermediate microcalcifications in the lateral right breast and these were biopsied 11/20/2013. The result of that procedure ([id number]) showed atypical ductal hyperplasia. Accordingly on 02/23/2014 the patient underwent right lumpectomy, with the pathology ([id number]) showing ductal carcinoma in situ, grade 2, measuring 0.7 cm. Estrogen receptor was 100% positive. Progesterone receptor was 96% positive. Both showed strong staining intensity. Margins were clear but the closest margin anteriorly was at 1 mm.  On 03/07/2014 the patient underwent postoperative mammography which showed post lumpectomy changes but also calcifications extending from the lumpectomy site anteriorly towards the nipple. On 03/12/2014 the patient underwent bilateral breast MRI. Breast composition here was described as category B. In the right breast there was a 4.8 cm fluidlike excisional  cavity in the lower outer right breast. Adjacent to this was a focus of non-masslike enhancement measuring 1.9 cm. This is an area associated with the scar from the excision in 2007. There was also non-masslike enhancement inferior to the biopsy cavity measuring 3.8 cm. There were no abnormal lymph nodes and no masses or abnormal enhancement in the left breast. Specifically the left breast lower outer quadrant was unchanged from 2007 MRI.  Overall it was felt the linear non-masslike enhancement extending almost 4 cm inferior to the excisional cavity was sufficiently suspicious to warrant biopsy, and this was performed 03/29/2014. It again showed ductal carcinoma in situ, grade 1, estrogen receptor and progesterone receptor both 100% positive with strong staining intensity.  At this point the patient met with surgery and opted for definitive right mastectomy with immediate reconstruction. On 05/16/2014 she underwent simple mastectomy with sentinel lymph node sampling. The pathology ([id number]) showed atypical ductal hyperplasia but no evidence of neoplasia. All 5 sentinel lymph nodes were clear. The patient had immediate right breast reconstruction with tissue expander and dermal matrix placement.  She is scheduled for definitive implant placement 08/08/2014.  Her subsequent history is as detailed below   PAST MEDICAL HISTORY: Past Medical History:  Diagnosis Date   Cancer ([place])    breast cancer   Hypertension    PONV (postoperative nausea and vomiting)    Scoliosis    Wears glasses     PAST SURGICAL HISTORY: Past Surgical History:  Procedure Laterality Date   AUGMENTATION MAMMAPLASTY Right    2015 post mastectomy   AXILLARY LYMPH NODE DISSECTION Left 11/13/2019   Procedure: LEFT AXILLARY LYMPH NODE DISSECTION;  Surgeon: Rolm Bookbinder, MD;  Location: [facility];  Service: General;  Laterality: Left;   BREAST RECONSTRUCTION WITH PLACEMENT OF TISSUE  EXPANDER AND  FLEX HD (ACELLULAR HYDRATED DERMIS) Right 05/16/2014   Procedure: IMMEDIATE RIGHT BREAST RECONSTRUCTION WITH PLACEMENT OF TISSUE EXPANDER AND FLEX HD (ACELLULAR HYDRATED DERMIS);  Surgeon: Theodoro Kos, DO;  Location: [facility];  Service: Plastics;  Laterality: Right;   BREAST RECONSTRUCTION WITH PLACEMENT OF TISSUE EXPANDER AND FLEX HD (ACELLULAR HYDRATED DERMIS) Left 09/27/2019   Procedure: LEFT BREAST RECONSTRUCTION WITH PLACEMENT OF TISSUE EXPANDER AND FLEX HD (ACELLULAR HYDRATED DERMIS);  Surgeon: Wallace Going, DO;  Location: [facility];  Service: Plastics;  Laterality: Left;   BREAST SURGERY     right breast excisional biopsy   DILATION AND CURETTAGE OF UTERUS     HYSTEROSCOPY WITH D & C N/A 09/20/2015   Procedure: DILATATION AND CURETTAGE /HYSTEROSCOPY;  Surgeon: Brien Few, MD;  Location: [facility];  Service: Gynecology;  Laterality: N/A;   LIPOSUCTION Bilateral 08/08/2014   Procedure: LIPO SUCTION ;  Surgeon: Theodoro Kos, DO;  Location: [facility];  Service: Plastics;  Laterality: Bilateral;   MASTECTOMY Right 05/16/2014   placement of acellular dermal matrix & tissue expanders    MASTOPEXY Left 08/08/2014   Procedure:  MASTOPEXY FOR SYMMETRY;  Surgeon: Theodoro Kos, DO;  Location: [facility];  Service: Plastics;  Laterality: Left;   NIPPLE SPARING MASTECTOMY WITH SENTINEL LYMPH NODE BIOPSY Left 09/27/2019   Procedure: LEFT NIPPLE SPARING MASTECTOMY WITH LEFT AXILLARY SENTINEL LYMPH NODE BIOPSY;  Surgeon: Rolm Bookbinder, MD;  Location: [facility];  Service: General;  Laterality: Left;   REDUCTION MAMMAPLASTY Left    2015   REMOVAL OF TISSUE EXPANDER AND PLACEMENT OF IMPLANT Right 08/08/2014   Procedure: REMOVAL OF RIGHT  TISSUE EXPANDERS WITH PLACEMENT OF RIGHT BREAST IMPLANTS WITH LIPO SUCTION ;  Surgeon: Theodoro Kos, DO;  Location: [facility];  Service: Plastics;  Laterality: Right;   SCAR  REVISION Left 11/13/2019   Procedure: EXCISION OF LEFT MASTECTOMY SKIN;  Surgeon: Rolm Bookbinder, MD;  Location: [facility];  Service: General;  Laterality: Left;   scoliosis  1972   harrington rods-age 68   TONSILLECTOMY      FAMILY HISTORY Family History  Problem Relation Age of Onset   Cancer Father        liver   Breast cancer Paternal Aunt    there is significant cancer history on the paternal side, her father dying at the age of 35 with what the patient tells me was metastatic head and neck cancer (but also involving the colon and liver). One of the patient's father's brothers had stomach cancer, and another had a brain tumor, and 2 of the patient's father's sisters had breast cancer, 1 diagnosed at age 30, the other at age 46.  On the mother's side there is a history of non-Hodgkin's lymphoma at age 25. There is no history of ovarian cancer in the family   GYNECOLOGIC HISTORY:  Patient's last menstrual period was 10/16/2013.  menarche age 12, first live birth age 78. The patient is GX P3. She stopped having periods in October 2014. She did not take hormone replacement. She did take birth control pills for approximately 1 year remotely, with no complications.   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 She works as an Web designer. Son Truman Hayward is a truck Geophysicist/field seismologist. Daughter Thayer Headings is a Forensic psychologist and currently          .  Son Roselyn Reef      . The patient has        grandchildren. She attends [employer]    ADVANCED DIRECTIVES: In the absence of any documents to the contrary the patient's husband is her healthcare power of attorney  HEALTH MAINTENANCE: Social History   Tobacco Use   Smoking status: Never Smoker   Smokeless tobacco: Never Used  Substance Use Topics   Alcohol use: No   Drug use: No     Colonoscopy:  2013  PAP: March 2015  Bone  density:  Lipid panel:  No Known Allergies  Current Outpatient Medications  Medication Sig Dispense Refill   Ascorbic Acid (VITAMIN C) 100 MG tablet Take 100 mg by mouth daily.     Flaxseed Oil (LINSEED OIL) OIL by Misc.(Non-Drug; Combo Route) route.     Ginger, Zingiber officinalis, (GINGER PO) Take by mouth.     lisinopril-hydrochlorothiazide (PRINZIDE,ZESTORETIC) 20-25 MG per tablet Take 1 tablet by mouth every evening.      Multiple Vitamin (MULTIVITAMIN) capsule Take by mouth.     Pediatric Multivitamins-Fl (MULTI VIT/FL PO) Take by mouth.     Turmeric (QC TUMERIC COMPLEX) 500 MG CAPS Take by mouth.     No current facility-administered medications for this visit.    OBJECTIVE: White woman who appears younger than stated age There were no vitals filed for this visit.   There is no height or weight on file to calculate BMI.    ECOG FS:1 - Symptomatic but completely ambulatory  Sclerae unicteric, EOMs intact Wearing a mask No cervical or supraclavicular adenopathy Lungs no rales or rhonchi Heart regular rate and rhythm Abd soft, nontender, positive bowel sounds MSK no focal spinal tenderness, no upper extremity lymphedema Neuro: nonfocal, well oriented, appropriate affect Breasts: The right breast is status post lumpectomy with saline reconstruction.  There is no evidence of disease recurrence.  The left breast is status post mastectomy with expander in place.  The incision is healing well although there is some anterior desquamation.  Initial cosmetic appearance is good.  Both axillae are benign.  LAB RESULTS:  CMP     Component Value Date/Time   NA 142 10/18/2019 1102   NA 143 07/30/2014 1603   K 3.5 10/18/2019 1102   K 3.5 07/30/2014 1603   CL 103 10/18/2019 1102   CO2 29 10/18/2019 1102   CO2 30 (H) 07/30/2014 1603   GLUCOSE 106 (H) 10/18/2019 1102   GLUCOSE 93 07/30/2014 1603   BUN 10 10/18/2019 1102   BUN 10.5 07/30/2014 1603   CREATININE 0.78 10/18/2019  1102   CREATININE 0.9 07/30/2014 1603   CALCIUM 8.9 10/18/2019 1102   CALCIUM 9.5 07/30/2014 1603   PROT 7.1 10/18/2019 1102   PROT 7.0 07/30/2014 1603   ALBUMIN 4.0 10/18/2019 1102   ALBUMIN 4.0 07/30/2014 1603   AST 18 10/18/2019 1102   AST 23 07/30/2014 1603   ALT 24 10/18/2019 1102   ALT 23 07/30/2014 1603   ALKPHOS 86 10/18/2019 1102   ALKPHOS 78 07/30/2014 1603   BILITOT 0.4 10/18/2019 1102   BILITOT 0.29 07/30/2014 1603   GFRNONAA >60 10/18/2019 1102   GFRAA >60 10/18/2019 1102    INo results found for: SPEP, UPEP  Lab Results  Component Value Date   WBC 5.7 10/18/2019   NEUTROABS 3.2 10/18/2019   HGB 13.7 10/18/2019   HCT 39.3 10/18/2019   MCV 91.8 10/18/2019   PLT 343 10/18/2019  Chemistry      Component Value Date/Time   NA 142 10/18/2019 1102   NA 143 07/30/2014 1603   K 3.5 10/18/2019 1102   K 3.5 07/30/2014 1603   CL 103 10/18/2019 1102   CO2 29 10/18/2019 1102   CO2 30 (H) 07/30/2014 1603   BUN 10 10/18/2019 1102   BUN 10.5 07/30/2014 1603   CREATININE 0.78 10/18/2019 1102   CREATININE 0.9 07/30/2014 1603      Component Value Date/Time   CALCIUM 8.9 10/18/2019 1102   CALCIUM 9.5 07/30/2014 1603   ALKPHOS 86 10/18/2019 1102   ALKPHOS 78 07/30/2014 1603   AST 18 10/18/2019 1102   AST 23 07/30/2014 1603   ALT 24 10/18/2019 1102   ALT 23 07/30/2014 1603   BILITOT 0.4 10/18/2019 1102   BILITOT 0.29 07/30/2014 1603      No results found for: LABCA2  No components found for: LABCA125  No results for input(s): INR in the last 168 hours.  Urinalysis No results found for: COLORURINE, APPEARANCEUR, LABSPEC, PHURINE, GLUCOSEU, HGBUR, BILIRUBINUR, KETONESUR, PROTEINUR, UROBILINOGEN, NITRITE, LEUKOCYTESUR   STUDIES: CT Chest W Contrast  Result Date: 10/31/2019 CLINICAL DATA:  Left breast carcinoma. Recent left mastectomy. Staging. EXAM: CT CHEST, ABDOMEN, AND PELVIS WITH CONTRAST TECHNIQUE: Multidetector CT imaging of the chest, abdomen and  pelvis was performed following the standard protocol during bolus administration of intravenous contrast. CONTRAST:  179m OMNIPAQUE IOHEXOL 300 MG/ML  SOLN COMPARISON:  None. FINDINGS: CT CHEST FINDINGS Cardiovascular: No acute findings. Aortic atherosclerosis incidentally noted. Mediastinum/Lymph Nodes: No masses or pathologically enlarged lymph nodes identified. Bilateral breast implants are noted, and no axillary lymphadenopathy identified. Lungs/Pleura: 3 mm pulmonary nodule seen in the medial left lung apex on image 22/6. No other suspicious pulmonary nodules or masses identified. No evidence of pulmonary infiltrate or pleural effusion. Musculoskeletal: No suspicious bone lesions identified. Thoracic posterior spinal fixation rods noted. CT ABDOMEN AND PELVIS FINDINGS Hepatobiliary: No masses identified. Two small cysts are noted. Mild hepatic steatosis also demonstrated. Gallbladder is unremarkable. No evidence of biliary ductal dilatation. Pancreas:  No mass or inflammatory changes. Spleen:  Within normal limits in size and appearance. Adrenals/Urinary tract: No masses or hydronephrosis. Ptotic left kidney noted which is located in the left lower quadrant. Stomach/Bowel: No evidence of obstruction, inflammatory process, or abnormal fluid collections. Normal appendix visualized. Vascular/Lymphatic: No pathologically enlarged lymph nodes identified. No abdominal aortic aneurysm. Reproductive: 2 small uterine fibroids noted, largest measuring 2.8 cm. Adnexal regions are unremarkable. Other: Small midline epigastric ventral hernia containing only omental fat. Musculoskeletal:  No suspicious bone lesions identified. IMPRESSION: 1. No definite evidence of metastatic disease within the chest, abdomen, or pelvis. 2. 3 mm indeterminate left upper lobe pulmonary nodule. Recommend continued followup by chest CT in 6 months. 3. Mild hepatic steatosis. 4. Small uterine fibroids. 5. Small epigastric ventral hernia  containing only omental fat. Electronically Signed   By: JMarlaine HindM.D.   On: 10/31/2019 10:44   CT Abdomen Pelvis W Contrast  Result Date: 10/31/2019 CLINICAL DATA:  Left breast carcinoma. Recent left mastectomy. Staging. EXAM: CT CHEST, ABDOMEN, AND PELVIS WITH CONTRAST TECHNIQUE: Multidetector CT imaging of the chest, abdomen and pelvis was performed following the standard protocol during bolus administration of intravenous contrast. CONTRAST:  1069mOMNIPAQUE IOHEXOL 300 MG/ML  SOLN COMPARISON:  None. FINDINGS: CT CHEST FINDINGS Cardiovascular: No acute findings. Aortic atherosclerosis incidentally noted. Mediastinum/Lymph Nodes: No masses or pathologically enlarged lymph nodes identified. Bilateral breast implants are noted,  and no axillary lymphadenopathy identified. Lungs/Pleura: 3 mm pulmonary nodule seen in the medial left lung apex on image 22/6. No other suspicious pulmonary nodules or masses identified. No evidence of pulmonary infiltrate or pleural effusion. Musculoskeletal: No suspicious bone lesions identified. Thoracic posterior spinal fixation rods noted. CT ABDOMEN AND PELVIS FINDINGS Hepatobiliary: No masses identified. Two small cysts are noted. Mild hepatic steatosis also demonstrated. Gallbladder is unremarkable. No evidence of biliary ductal dilatation. Pancreas:  No mass or inflammatory changes. Spleen:  Within normal limits in size and appearance. Adrenals/Urinary tract: No masses or hydronephrosis. Ptotic left kidney noted which is located in the left lower quadrant. Stomach/Bowel: No evidence of obstruction, inflammatory process, or abnormal fluid collections. Normal appendix visualized. Vascular/Lymphatic: No pathologically enlarged lymph nodes identified. No abdominal aortic aneurysm. Reproductive: 2 small uterine fibroids noted, largest measuring 2.8 cm. Adnexal regions are unremarkable. Other: Small midline epigastric ventral hernia containing only omental fat. Musculoskeletal:   No suspicious bone lesions identified. IMPRESSION: 1. No definite evidence of metastatic disease within the chest, abdomen, or pelvis. 2. 3 mm indeterminate left upper lobe pulmonary nodule. Recommend continued followup by chest CT in 6 months. 3. Mild hepatic steatosis. 4. Small uterine fibroids. 5. Small epigastric ventral hernia containing only omental fat. Electronically Signed   By: Marlaine Hind M.D.   On: 10/31/2019 10:44   NM PET ([facility]) WHOLE BODY  Result Date: 11/21/2019 CLINICAL DATA:  Estrogen receptor LEFT breast cancer. Invasive lobular carcinoma. Remote history of RIGHT breast cancer invasive lobular carcinoma. LEFT axillary lymph node dissection 11/13/2019. EXAM: NUCLEAR MEDICINE PET WHOLE BODY TECHNIQUE: 6.51 mCi F-18 Estradiol was injected intravenously. Full-ring PET imaging was performed from the vertex to feet after the radiotracer. CT data was obtained and used for attenuation correction and anatomic localization. COMPARISON:  Chest CT 10/31/2019 FINDINGS: Mediastinal blood pool activity: 1.4 HEAD/NECK: No abnormal radiotracer accumulation the brain or within the cervical nodes. Incidental CT findings: None CHEST: There is a chain of small lymph nodes beginning in the level sub pectoralis nodal station and extending to the LEFT supraclavicular nodal station which accumulate the estrogen receptor avid radiotracer. These lymph nodes are small and number approximately 4 nodes. For example small LEFT supraclavicular node adjacent to the thyroid gland measures 5 mm (image 66/4 with SUV max equal 4.8. More inferior node present on PET image 70 difficult define on the CT portion exam. LEFT sub pectoralis node measuring 5 mm (image 75/4) with SUV max equal 3.8 (PET image 70). Posterior triangle node (LEFT level V) which also difficult define on CT portion exam (image 59/4) with SUV max equal 3 point No abnormal activity in internal mammary lymph nodes. No activity in mediastinal or hilar nodes.  NO RIGHT axillary nodal metastasis. Incidental CT findings: No suspicious pulmonary nodules. ABDOMEN/PELVIS: No abnormal radiotracer accumulation the abdomen pelvis. Abnormal lymph nodes. No peritoneal disease. Physiologic activity noted in the liver, biliary tract, and bowel. Diffuse physiologic activity noted in the uterus. Incidental CT findings: Several benign-appearing cysts in the liver. SKELETON: No abnormal radiotracer accumulation Incidental CT findings: No lytic or sclerotic lesion. EXTREMITIES: No abnormal radiotracer accumulation Incidental CT findings: None IMPRESSION: 1. A chain of small lymph nodes extending from the LEFT axilla to the LEFT supraclavicular nodal station accumulate the estrogen receptor avid radiotracer and therefore are most consistent with ER postive breast cancer nodal metastasis. 2. No evidence central intrathoracic nodal metastasis. 3. No evidence distant visceral disease or skeletal disease. These results will be called to the  ordering clinician or representative by the Radiologist Assistant, and communication documented in the PACS or [employer]. Electronically Signed   By: Suzy Bouchard M.D.   On: 11/21/2019 10:30      ASSESSMENT: 59 y.o. [place], [place] woman  RIGHT BREAST CANCER (1) status post right breast upper-outer quadrant lumpectomy 07/30/2006 showing atypical ductal hyperplasia.  (2) right breast upper outer quadrant biopsy 11/20/2013 showed atypical ductal hyperplasia   (3) right lumpectomy 02/26/2014 showed ductal carcinoma in situ measuring 0.7 cm, estrogen receptor 100% positive, progesterone receptor 96% positive, with 1 mm margins  (4) right breast lower inner quadrant biopsy 03/29/2014 showed ductal carcinoma in situ, 100% estrogen receptor positive, 100% progesterone receptor positive,  (5) status post right mastectomy with sentinel lymph node sampling showing only atypical ductal hyperplasia,  (a) 5 sentinel lymph nodes removed, all  clear  (b) status post right saline implant reconstruction  (6) the patient opted against prophylactic antiestrogens   LEFT BREAST CANCER (7) status post left breast lower outer quadrant biopsy 07/12/2019 for a clinical T1N0, stage IA invasive lobular carcinoma, grade 1, with evidence of perineural invasion, estrogen receptor moderately positive, progesterone receptor negative, HER-2 not amplified, with an MIB-1 of less than 1%  (8) status post left nipple sparing mastectomy 09/27/2019 for a pT3 pN1, stage IIIA invasive lobular carcinoma, grade 2, with a negative nipple biopsy and a focally positive anterior margin  (a) 1 sentinel lymph node had a 2.1 cm tumor deposit; a second possible lymph node had no residual lymph node tissue  (b) immediate expander placement  (c) status post left axillary lymph node dissection and margin excision 11/13/2019, no residual disease   (i) 7 of 7 left axillary lymph nodes negative for carcinoma.  (d) cesarean as scan 11/20/2019 shows no evidence of lung, bone, or liver involvement.  4 small lymph nodes (left axilla to left supraclavicular area) are positive for estrogen uptake   (9) MammaPrint "low risk" predicts no significant benefit from chemotherapy.  (10) adjuvant radiation pending  (11) staging studies: pending  (a) chest, abdomen and pelvic CT with contrast  (b) consider F18 estradiol PET scan  (12) adjuvant tamoxifen to follow  (13) genetics testing  PLAN: I reviewed the recent developments with Natasha Graham and she understands that this time she has an invasive cancer whereas before it was noninvasive.  Furthermore this cancer is locally advanced, stage III.  We discussed the difference between ductal and lobular breast cancers.  Lobular breast cancers do not make a "glue" and therefore the cancer cells do not hang together.  The result of this is the cancer is very difficult to detect.  Surgically it is hard to feel an edge and on radiology  frequently no masses are seen even when prior biopsy has shown tumor to be present.  The risk of systemic spread is greater in stage III and therefore we generally stage these patients with CT scans and I have ordered these.  They are likely to be negative as just discussed.  We have a new type of PET scan, using fluoro18estradiol instead of glucose.  This is much more sensitive than the standard PET in lobular breast cancer cases and depending on the results of her prior CT scans we will consider proceeding to this particular study  If work-up shows only stage III disease, then she will have completion axillary lymph node dissection and adjuvant radiation.  She will continue to have filling of her expander and likely definitive reconstruction after  the radiation.  She will then be ready to start antiestrogens. We discussed the difference between tamoxifen and anastrozole in detail. She understands that anastrozole and the aromatase inhibitors in general work by blocking estrogen production. Accordingly vaginal dryness, decrease in bone density, and of course hot flashes can result. The aromatase inhibitors can also negatively affect the cholesterol profile, although that is a minor effect. One out of 5 women on aromatase inhibitors we will feel "old and achy". This arthralgia/myalgia syndrome, which resembles fibromyalgia clinically, does resolve with stopping the medications. Accordingly this is not a reason to not try an aromatase inhibitor but it is a frequent reason to stop it (in other words 20% of women will not be able to tolerate these medications).  Tamoxifen on the other hand does not block estrogen production. It does not "take away a woman's estrogen". It blocks the estrogen receptor in breast cells. Like anastrozole, it can also cause hot flashes. As opposed to anastrozole, tamoxifen has many estrogen-like effects. It is technically an estrogen receptor modulator. This means that in some  tissues tamoxifen works like estrogen-- for example it helps strengthen the bones. It tends to improve the cholesterol profile. It can cause thickening of the endometrial lining, and even endometrial polyps or rarely cancer of the uterus.(The risk of uterine cancer due to tamoxifen is one additional cancer per thousand women year). It can cause vaginal wetness or stickiness. It can cause blood clots through this estrogen-like effect--the risk of blood clots with tamoxifen is exactly the same as with birth control pills or hormone replacement.  Neither of these agents causes mood changes or weight gain, despite the popular belief that they can have these side effects. We have data from studies comparing either of these drugs with placebo, and in those cases the control group had the same amount of weight gain and depression as the group that took the drug.  In the end we decided that she would likely start with tamoxifen, but we will not start that until radiation has been completed.  Tentatively she will see me again in about a month, but of course I will contact her once we have the results of the scans and if necessary I will see her before then.  Total encounter time 65 minutes.Sarajane Jews C. Letonia Stead, MD 11/22/2019 8:07 AM  Oncology and Hematology [facility] [place] Tel. [phone]  Joylene Igo [phone]   I, Wilburn Mylar, am acting as scribe for Dr. Virgie Dad. Hanalei Glace.  I, Lurline Del MD, have reviewed the above documentation for accuracy and completeness, and I agree with the above.    *Total Encounter Time as defined by the Centers for Medicare and Medicaid Services includes, in addition to the face-to-face time of a patient visit (documented in the note above) non-face-to-face time: obtaining and reviewing outside history, ordering and reviewing medications, tests or procedures, care coordination (communications with other health care professionals or  caregivers) and documentation in the medical record.

## 2019-11-22 NOTE — Progress Notes (Unsigned)
Natasha Graham  [phone] Fax:([phone]     ID: Natasha Graham DOB: Apr 09, 1961  MR#: [id number]  YHC#:[id number]  Patient Care Team: Leonides Sake, MD as PCP - General (Family Medicine) Rolm Bookbinder, MD as Consulting Physician (General Surgery) Ryenne Lynam, Virgie Dad, MD as Consulting Physician (Oncology) Mauro Kaufmann, RN as Registered Nurse Rockwell Germany, RN as Registered Nurse Dillingham, Loel Lofty, DO as Attending Physician (Plastic Surgery) OTHER MD: Sherrie Mustache MD  CHIEF COMPLAINT: new estrogen receptor positive left breast cancer; remote noninvasive right breast cancer (s/p bilateral mastectomies)  CURRENT TREATMENT: Adjuvant radiation pending  INTERVAL HISTORY: Natasha Graham returns today to the breast cancer clinic.  I had seen her remotely when she had a right-sided noninvasive breast cancer.  She has now been found to have left sided breast cancer as detailed below.  She is here to discuss staging and systemic treatment options.  LEFT BREAST CANCER HISTORY: From the original intake note:  She had routine screening left mammogram on 06/14/2019 showing a developing asymmetry. She presented for left diagnostic mammogram and left breast ultrasound on 07/07/2019. Physical exam that day showed a palpable, firm, superficial mass in the left breast at 6 o'clock along the site of reduction mammoplasty scar (performed in 2015 with right mastectomy). Scans showed: breast density category B; 2.1 cm superficial mass involving the skin in the left breast at 6 o'clock; no enlarged adenopathy in the left axilla.   Accordingly on 07/12/2019 she proceeded to biopsy of the left breast area in question. The pathology from this procedure ([id number]) showed: invasive lobular carcinoma, grade 1, with perineural invasion present. Prognostic indicators significant for: estrogen receptor, 60% positive with moderate staining intensity and progesterone receptor, 60%  negative. Proliferation marker Ki67 at <1%. HER2 equvocal by immunohistochemistry (2+), but negative by fluorescent in situ hybridization with a signals ratio 1.18 and number per cell 1.95.  She opted to proceed with left mastectomy on 09/27/2019 under Dr. Donne Hazel with immediate reconstruction under Dr. Marla Roe. Pathology from the procedure [id number]) revealed: invasive lobular carcinoma, grade 2, 6.2 cm, focally involving anterior margin and involving skin dermis without involvement of epidermis; lobular carcinoma in situ with pagetoid spread; small focus of low grade ductal carcinoma in situ, 0.2 cm.  Two lymph nodes were biopsied. One showed metastatic lobular carcinoma with focus measuring 2.1 cm with evidence of extranodal extension. The other lymph node also showed metastatic lobular carcinoma with no lymphoid tissue present.  Biopsy of the nipple was also taken at that time and showed no evidence of invasive carcinoma.  Mammaprint was performed on the final surgical sample. This returned showing low risk.   REVIEW OF SYSTEMS: Natasha Graham did well with her surgery, with minimal pain, no significant problems with bleeding or fever.  She had her drain in for about 2 weeks.  She has an expander in place and view of eventual saline reconstruction.  She denies unusual headaches visual changes cough phlegm production pleurisy or any significant change in bowel or bladder habits.  A detailed review of systems today was otherwise stable.   RIGHT BREAST CANCER HISTORY:  Natasha Graham has a history of right breast biopsy in late 59 showing atypical ductal hyperplasia in the upper outer right breast.. Breast MRI 07/13/2006 showed postbiopsy changes in the upper right breast, and some right breast cysts. There were no solid masses however or areas of abnormal enhancement. There were no enlarged lymph nodes. Incidental finding of liver cysts was made. On 07/30/2006 she  underwent needle localization of the  calcifications in the outer right breast which also showed ([id number]) atypical ductal hyperplasia, with no malignancy.   On 10/21/2012 routine digital bilateral screening mammography at the breast Center showed breast density to be category C. In the right breast there were calcifications and architectural distortion to go with skin retraction. Additional views obtained 11/17/2012 showed extensive but stable punctate microcalcifications in the upper outer right breast. Many of these were superficial and not amenable to stereotactic biopsy. MRI was suggested but the patient was unable to undergo that test and therefore six-month mammography was obtained 05/15/2013. This showed the calcifications in the outer right breast to have been stable. Because of their extents and because of their superficial location they could not be all sampled all removed with biopsy. MRI was again discussed, but the patient has Harrington rods in place and also felt the cost of MRI was prohibitive.  On 11/13/2013 further six-month follow-up showed a grouping of intermediate microcalcifications in the lateral right breast and these were biopsied 11/20/2013. The result of that procedure ([id number]) showed atypical ductal hyperplasia. Accordingly on 02/23/2014 the patient underwent right lumpectomy, with the pathology ([id number]) showing ductal carcinoma in situ, grade 2, measuring 0.7 cm. Estrogen receptor was 100% positive. Progesterone receptor was 96% positive. Both showed strong staining intensity. Margins were clear but the closest margin anteriorly was at 1 mm.  On 03/07/2014 the patient underwent postoperative mammography which showed post lumpectomy changes but also calcifications extending from the lumpectomy site anteriorly towards the nipple. On 03/12/2014 the patient underwent bilateral breast MRI. Breast composition here was described as category B. In the right breast there was a 4.8 cm fluidlike excisional  cavity in the lower outer right breast. Adjacent to this was a focus of non-masslike enhancement measuring 1.9 cm. This is an area associated with the scar from the excision in 2007. There was also non-masslike enhancement inferior to the biopsy cavity measuring 3.8 cm. There were no abnormal lymph nodes and no masses or abnormal enhancement in the left breast. Specifically the left breast lower outer quadrant was unchanged from 2007 MRI.  Overall it was felt the linear non-masslike enhancement extending almost 4 cm inferior to the excisional cavity was sufficiently suspicious to warrant biopsy, and this was performed 03/29/2014. It again showed ductal carcinoma in situ, grade 1, estrogen receptor and progesterone receptor both 100% positive with strong staining intensity.  At this point the patient met with surgery and opted for definitive right mastectomy with immediate reconstruction. On 05/16/2014 she underwent simple mastectomy with sentinel lymph node sampling. The pathology ([id number]) showed atypical ductal hyperplasia but no evidence of neoplasia. All 5 sentinel lymph nodes were clear. The patient had immediate right breast reconstruction with tissue expander and dermal matrix placement.  She is scheduled for definitive implant placement 08/08/2014.  Her subsequent history is as detailed below   PAST MEDICAL HISTORY: Past Medical History:  Diagnosis Date  . Cancer [facility])    breast cancer  . Hypertension   . PONV (postoperative nausea and vomiting)   . Scoliosis   . Wears glasses     PAST SURGICAL HISTORY: Past Surgical History:  Procedure Laterality Date  . AUGMENTATION MAMMAPLASTY Right    2015 post mastectomy  . AXILLARY LYMPH NODE DISSECTION Left 11/13/2019   Procedure: LEFT AXILLARY LYMPH NODE DISSECTION;  Surgeon: Rolm Bookbinder, MD;  Location: [place];  Service: General;  Laterality: Left;  . BREAST RECONSTRUCTION WITH PLACEMENT  OF TISSUE EXPANDER AND  FLEX HD (ACELLULAR HYDRATED DERMIS) Right 05/16/2014   Procedure: IMMEDIATE RIGHT BREAST RECONSTRUCTION WITH PLACEMENT OF TISSUE EXPANDER AND FLEX HD (ACELLULAR HYDRATED DERMIS);  Surgeon: Theodoro Kos, DO;  Location: [place];  Service: Plastics;  Laterality: Right;  . BREAST RECONSTRUCTION WITH PLACEMENT OF TISSUE EXPANDER AND FLEX HD (ACELLULAR HYDRATED DERMIS) Left 09/27/2019   Procedure: LEFT BREAST RECONSTRUCTION WITH PLACEMENT OF TISSUE EXPANDER AND FLEX HD (ACELLULAR HYDRATED DERMIS);  Surgeon: Wallace Going, DO;  Location: [place];  Service: Plastics;  Laterality: Left;  . BREAST SURGERY     right breast excisional biopsy  . DILATION AND CURETTAGE OF UTERUS    . HYSTEROSCOPY WITH D & C N/A 09/20/2015   Procedure: DILATATION AND CURETTAGE /HYSTEROSCOPY;  Surgeon: Brien Few, MD;  Location: [facility];  Service: Gynecology;  Laterality: N/A;  . LIPOSUCTION Bilateral 08/08/2014   Procedure: LIPO SUCTION ;  Surgeon: Theodoro Kos, DO;  Location: [place];  Service: Plastics;  Laterality: Bilateral;  . MASTECTOMY Right 05/16/2014   placement of acellular dermal matrix & tissue expanders   . MASTOPEXY Left 08/08/2014   Procedure:  MASTOPEXY FOR SYMMETRY;  Surgeon: Theodoro Kos, DO;  Location: [place];  Service: Plastics;  Laterality: Left;  . NIPPLE SPARING MASTECTOMY WITH SENTINEL LYMPH NODE BIOPSY Left 09/27/2019   Procedure: LEFT NIPPLE SPARING MASTECTOMY WITH LEFT AXILLARY SENTINEL LYMPH NODE BIOPSY;  Surgeon: Rolm Bookbinder, MD;  Location: [place];  Service: General;  Laterality: Left;  . REDUCTION MAMMAPLASTY Left    2015  . REMOVAL OF TISSUE EXPANDER AND PLACEMENT OF IMPLANT Right 08/08/2014   Procedure: REMOVAL OF RIGHT  TISSUE EXPANDERS WITH PLACEMENT OF RIGHT BREAST IMPLANTS WITH LIPO SUCTION ;  Surgeon: Theodoro Kos, DO;  Location: [place];  Service: Plastics;  Laterality: Right;  . SCAR  REVISION Left 11/13/2019   Procedure: EXCISION OF LEFT MASTECTOMY SKIN;  Surgeon: Rolm Bookbinder, MD;  Location: [place];  Service: General;  Laterality: Left;  . scoliosis  1972   harrington rods-age 35  . TONSILLECTOMY      FAMILY HISTORY Family History  Problem Relation Age of Onset  . Cancer Father        liver  . Breast cancer Paternal Aunt    there is significant cancer history on the paternal side, her father dying at the age of 44 with what the patient tells me was metastatic head and neck cancer (but also involving the colon and liver). One of the patient's father's brothers had stomach cancer, and another had a brain tumor, and 2 of the patient's father's sisters had breast cancer, 1 diagnosed at age 65, the other at age 23.  On the mother's side there is a history of non-Hodgkin's lymphoma at age 42. There is no history of ovarian cancer in the family   GYNECOLOGIC HISTORY:  Patient's last menstrual period was 10/16/2013.  menarche age 71, first live birth age 71. The patient is GX P3. She stopped having periods in October 2014. She did not take hormone replacement. She did take birth control pills for approximately 1 year remotely, with no complications.   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 She works as an Web designer. Son Truman Hayward is a truck Geophysicist/field seismologist. Daughter Thayer Headings is a Forensic psychologist and currently          .  Son Roselyn Reef      . The patient has        grandchildren. She attends [employer]    ADVANCED DIRECTIVES: In the absence of any documents to the contrary the patient's husband is her healthcare power of attorney  HEALTH MAINTENANCE: Social History   Tobacco Use  . Smoking status: Never Smoker  . Smokeless tobacco: Never Used  Substance Use Topics  . Alcohol use: No  . Drug use: No     Colonoscopy:  2013  PAP: March 2015  Bone  density:  Lipid panel:  No Known Allergies  Current Outpatient Medications  Medication Sig Dispense Refill  . Ascorbic Acid (VITAMIN C) 100 MG tablet Take 100 mg by mouth daily.    . Flaxseed Oil (LINSEED OIL) OIL by Misc.(Non-Drug; Combo Route) route.    . Ginger, Zingiber officinalis, (GINGER PO) Take by mouth.    Marland Kitchen lisinopril-hydrochlorothiazide (PRINZIDE,ZESTORETIC) 20-25 MG per tablet Take 1 tablet by mouth every evening.     . Multiple Vitamin (MULTIVITAMIN) capsule Take by mouth.    . Pediatric Multivitamins-Fl (MULTI VIT/FL PO) Take by mouth.    . Turmeric (QC TUMERIC COMPLEX) 500 MG CAPS Take by mouth.     No current facility-administered medications for this visit.    OBJECTIVE: White woman who appears younger than stated age There were no vitals filed for this visit.   There is no height or weight on file to calculate BMI.    ECOG FS:1 - Symptomatic but completely ambulatory  Sclerae unicteric, EOMs intact Wearing a mask No cervical or supraclavicular adenopathy Lungs no rales or rhonchi Heart regular rate and rhythm Abd soft, nontender, positive bowel sounds MSK no focal spinal tenderness, no upper extremity lymphedema Neuro: nonfocal, well oriented, appropriate affect Breasts: The right breast is status post lumpectomy with saline reconstruction.  There is no evidence of disease recurrence.  The left breast is status post mastectomy with expander in place.  The incision is healing well although there is some anterior desquamation.  Initial cosmetic appearance is good.  Both axillae are benign.  LAB RESULTS:  CMP     Component Value Date/Time   NA 142 10/18/2019 1102   NA 143 07/30/2014 1603   K 3.5 10/18/2019 1102   K 3.5 07/30/2014 1603   CL 103 10/18/2019 1102   CO2 29 10/18/2019 1102   CO2 30 (H) 07/30/2014 1603   GLUCOSE 106 (H) 10/18/2019 1102   GLUCOSE 93 07/30/2014 1603   BUN 10 10/18/2019 1102   BUN 10.5 07/30/2014 1603   CREATININE 0.78 10/18/2019  1102   CREATININE 0.9 07/30/2014 1603   CALCIUM 8.9 10/18/2019 1102   CALCIUM 9.5 07/30/2014 1603   PROT 7.1 10/18/2019 1102   PROT 7.0 07/30/2014 1603   ALBUMIN 4.0 10/18/2019 1102   ALBUMIN 4.0 07/30/2014 1603   AST 18 10/18/2019 1102   AST 23 07/30/2014 1603   ALT 24 10/18/2019 1102   ALT 23 07/30/2014 1603   ALKPHOS 86 10/18/2019 1102   ALKPHOS 78 07/30/2014 1603   BILITOT 0.4 10/18/2019 1102   BILITOT 0.29 07/30/2014 1603   GFRNONAA >60 10/18/2019 1102   GFRAA >60 10/18/2019 1102    INo results found for: SPEP, UPEP  Lab Results  Component Value Date   WBC 5.7 10/18/2019   NEUTROABS 3.2 10/18/2019   HGB 13.7 10/18/2019   HCT 39.3 10/18/2019   MCV 91.8 10/18/2019   PLT 343 10/18/2019  Chemistry      Component Value Date/Time   NA 142 10/18/2019 1102   NA 143 07/30/2014 1603   K 3.5 10/18/2019 1102   K 3.5 07/30/2014 1603   CL 103 10/18/2019 1102   CO2 29 10/18/2019 1102   CO2 30 (H) 07/30/2014 1603   BUN 10 10/18/2019 1102   BUN 10.5 07/30/2014 1603   CREATININE 0.78 10/18/2019 1102   CREATININE 0.9 07/30/2014 1603      Component Value Date/Time   CALCIUM 8.9 10/18/2019 1102   CALCIUM 9.5 07/30/2014 1603   ALKPHOS 86 10/18/2019 1102   ALKPHOS 78 07/30/2014 1603   AST 18 10/18/2019 1102   AST 23 07/30/2014 1603   ALT 24 10/18/2019 1102   ALT 23 07/30/2014 1603   BILITOT 0.4 10/18/2019 1102   BILITOT 0.29 07/30/2014 1603      No results found for: LABCA2  No components found for: LABCA125  No results for input(s): INR in the last 168 hours.  Urinalysis No results found for: COLORURINE, APPEARANCEUR, LABSPEC, PHURINE, GLUCOSEU, HGBUR, BILIRUBINUR, KETONESUR, PROTEINUR, UROBILINOGEN, NITRITE, LEUKOCYTESUR   STUDIES: CT Chest W Contrast  Result Date: 10/31/2019 CLINICAL DATA:  Left breast carcinoma. Recent left mastectomy. Staging. EXAM: CT CHEST, ABDOMEN, AND PELVIS WITH CONTRAST TECHNIQUE: Multidetector CT imaging of the chest, abdomen and  pelvis was performed following the standard protocol during bolus administration of intravenous contrast. CONTRAST:  186m OMNIPAQUE IOHEXOL 300 MG/ML  SOLN COMPARISON:  None. FINDINGS: CT CHEST FINDINGS Cardiovascular: No acute findings. Aortic atherosclerosis incidentally noted. Mediastinum/Lymph Nodes: No masses or pathologically enlarged lymph nodes identified. Bilateral breast implants are noted, and no axillary lymphadenopathy identified. Lungs/Pleura: 3 mm pulmonary nodule seen in the medial left lung apex on image 22/6. No other suspicious pulmonary nodules or masses identified. No evidence of pulmonary infiltrate or pleural effusion. Musculoskeletal: No suspicious bone lesions identified. Thoracic posterior spinal fixation rods noted. CT ABDOMEN AND PELVIS FINDINGS Hepatobiliary: No masses identified. Two small cysts are noted. Mild hepatic steatosis also demonstrated. Gallbladder is unremarkable. No evidence of biliary ductal dilatation. Pancreas:  No mass or inflammatory changes. Spleen:  Within normal limits in size and appearance. Adrenals/Urinary tract: No masses or hydronephrosis. Ptotic left kidney noted which is located in the left lower quadrant. Stomach/Bowel: No evidence of obstruction, inflammatory process, or abnormal fluid collections. Normal appendix visualized. Vascular/Lymphatic: No pathologically enlarged lymph nodes identified. No abdominal aortic aneurysm. Reproductive: 2 small uterine fibroids noted, largest measuring 2.8 cm. Adnexal regions are unremarkable. Other: Small midline epigastric ventral hernia containing only omental fat. Musculoskeletal:  No suspicious bone lesions identified. IMPRESSION: 1. No definite evidence of metastatic disease within the chest, abdomen, or pelvis. 2. 3 mm indeterminate left upper lobe pulmonary nodule. Recommend continued followup by chest CT in 6 months. 3. Mild hepatic steatosis. 4. Small uterine fibroids. 5. Small epigastric ventral hernia  containing only omental fat. Electronically Signed   By: JMarlaine HindM.D.   On: 10/31/2019 10:44   CT Abdomen Pelvis W Contrast  Result Date: 10/31/2019 CLINICAL DATA:  Left breast carcinoma. Recent left mastectomy. Staging. EXAM: CT CHEST, ABDOMEN, AND PELVIS WITH CONTRAST TECHNIQUE: Multidetector CT imaging of the chest, abdomen and pelvis was performed following the standard protocol during bolus administration of intravenous contrast. CONTRAST:  1053mOMNIPAQUE IOHEXOL 300 MG/ML  SOLN COMPARISON:  None. FINDINGS: CT CHEST FINDINGS Cardiovascular: No acute findings. Aortic atherosclerosis incidentally noted. Mediastinum/Lymph Nodes: No masses or pathologically enlarged lymph nodes identified. Bilateral breast implants are noted,  and no axillary lymphadenopathy identified. Lungs/Pleura: 3 mm pulmonary nodule seen in the medial left lung apex on image 22/6. No other suspicious pulmonary nodules or masses identified. No evidence of pulmonary infiltrate or pleural effusion. Musculoskeletal: No suspicious bone lesions identified. Thoracic posterior spinal fixation rods noted. CT ABDOMEN AND PELVIS FINDINGS Hepatobiliary: No masses identified. Two small cysts are noted. Mild hepatic steatosis also demonstrated. Gallbladder is unremarkable. No evidence of biliary ductal dilatation. Pancreas:  No mass or inflammatory changes. Spleen:  Within normal limits in size and appearance. Adrenals/Urinary tract: No masses or hydronephrosis. Ptotic left kidney noted which is located in the left lower quadrant. Stomach/Bowel: No evidence of obstruction, inflammatory process, or abnormal fluid collections. Normal appendix visualized. Vascular/Lymphatic: No pathologically enlarged lymph nodes identified. No abdominal aortic aneurysm. Reproductive: 2 small uterine fibroids noted, largest measuring 2.8 cm. Adnexal regions are unremarkable. Other: Small midline epigastric ventral hernia containing only omental fat. Musculoskeletal:   No suspicious bone lesions identified. IMPRESSION: 1. No definite evidence of metastatic disease within the chest, abdomen, or pelvis. 2. 3 mm indeterminate left upper lobe pulmonary nodule. Recommend continued followup by chest CT in 6 months. 3. Mild hepatic steatosis. 4. Small uterine fibroids. 5. Small epigastric ventral hernia containing only omental fat. Electronically Signed   By: Marlaine Hind M.D.   On: 10/31/2019 10:44   NM PET (CERIANNA) WHOLE BODY  Result Date: 11/21/2019 CLINICAL DATA:  Estrogen receptor LEFT breast cancer. Invasive lobular carcinoma. Remote history of RIGHT breast cancer invasive lobular carcinoma. LEFT axillary lymph node dissection 11/13/2019. EXAM: NUCLEAR MEDICINE PET WHOLE BODY TECHNIQUE: 6.51 mCi F-18 Estradiol was injected intravenously. Full-ring PET imaging was performed from the vertex to feet after the radiotracer. CT data was obtained and used for attenuation correction and anatomic localization. COMPARISON:  Chest CT 10/31/2019 FINDINGS: Mediastinal blood pool activity: 1.4 HEAD/NECK: No abnormal radiotracer accumulation the brain or within the cervical nodes. Incidental CT findings: None CHEST: There is a chain of small lymph nodes beginning in the level sub pectoralis nodal station and extending to the LEFT supraclavicular nodal station which accumulate the estrogen receptor avid radiotracer. These lymph nodes are small and number approximately 4 nodes. For example small LEFT supraclavicular node adjacent to the thyroid gland measures 5 mm (image 66/4 with SUV max equal 4.8. More inferior node present on PET image 70 difficult define on the CT portion exam. LEFT sub pectoralis node measuring 5 mm (image 75/4) with SUV max equal 3.8 (PET image 70). Posterior triangle node (LEFT level V) which also difficult define on CT portion exam (image 59/4) with SUV max equal 3 point No abnormal activity in internal mammary lymph nodes. No activity in mediastinal or hilar nodes.  NO RIGHT axillary nodal metastasis. Incidental CT findings: No suspicious pulmonary nodules. ABDOMEN/PELVIS: No abnormal radiotracer accumulation the abdomen pelvis. Abnormal lymph nodes. No peritoneal disease. Physiologic activity noted in the liver, biliary tract, and bowel. Diffuse physiologic activity noted in the uterus. Incidental CT findings: Several benign-appearing cysts in the liver. SKELETON: No abnormal radiotracer accumulation Incidental CT findings: No lytic or sclerotic lesion. EXTREMITIES: No abnormal radiotracer accumulation Incidental CT findings: None IMPRESSION: 1. A chain of small lymph nodes extending from the LEFT axilla to the LEFT supraclavicular nodal station accumulate the estrogen receptor avid radiotracer and therefore are most consistent with ER postive breast cancer nodal metastasis. 2. No evidence central intrathoracic nodal metastasis. 3. No evidence distant visceral disease or skeletal disease. These results will be called to the  ordering clinician or representative by the Radiologist Assistant, and communication documented in the PACS or [employer]. Electronically Signed   By: Suzy Bouchard M.D.   On: 11/21/2019 10:30      ASSESSMENT: 59 y.o. [place], [place] woman  RIGHT BREAST CANCER (1) status post right breast upper-outer quadrant lumpectomy 07/30/2006 showing atypical ductal hyperplasia.  (2) right breast upper outer quadrant biopsy 11/20/2013 showed atypical ductal hyperplasia   (3) right lumpectomy 02/26/2014 showed ductal carcinoma in situ measuring 0.7 cm, estrogen receptor 100% positive, progesterone receptor 96% positive, with 1 mm margins  (4) right breast lower inner quadrant biopsy 03/29/2014 showed ductal carcinoma in situ, 100% estrogen receptor positive, 100% progesterone receptor positive,  (5) status post right mastectomy with sentinel lymph node sampling showing only atypical ductal hyperplasia,  (a) 5 sentinel lymph nodes removed, all  clear  (b) status post right saline implant reconstruction  (6) the patient opted against prophylactic antiestrogens   LEFT BREAST CANCER (7) status post left breast lower outer quadrant biopsy 07/12/2019 for a clinical T1N0, stage IA invasive lobular carcinoma, grade 1, with evidence of perineural invasion, estrogen receptor moderately positive, progesterone receptor negative, HER-2 not amplified, with an MIB-1 of less than 1%  (8) status post left nipple sparing mastectomy 09/27/2019 for a pT3 pN1, stage IIIA invasive lobular carcinoma, grade 2, with a negative nipple biopsy and a focally positive anterior margin  (a) 1 sentinel lymph node had a 2.1 cm tumor deposit; a second possible lymph node had no residual lymph node tissue  (b) immediate expander placement  (c) status post left axillary lymph node dissection and margin excision 11/13/2019, no residual disease   (i) 7 of 7 left axillary lymph nodes negative for carcinoma.  (d) cesarean as scan 11/20/2019 shows no evidence of lung, bone, or liver involvement.  4 small lymph nodes (left axilla to left supraclavicular area) are positive for estrogen uptake   (9) MammaPrint "low risk" predicts no significant benefit from chemotherapy.  (10) adjuvant radiation pending  (11) staging studies: pending  (a) chest, abdomen and pelvic CT with contrast  (b) F18 estradiol PET scan shows a chain of very small lymph nodes from the subpectoralis nodal station to the left supraclavicular nodal station positive for ER accumulation with SUVs in the 4 range (versus mediastinal blood pool 1.4).  (12) adjuvant tamoxifen to follow  (13) genetics testing  PLAN: I reviewed the recent developments with Debbie and she understands that this time she has an invasive cancer whereas before it was noninvasive.  Furthermore this cancer is locally advanced, stage III.  We discussed the difference between ductal and lobular breast cancers.  Lobular breast cancers  do not make a "glue" and therefore the cancer cells do not hang together.  The result of this is the cancer is very difficult to detect.  Surgically it is hard to feel an edge and on radiology frequently no masses are seen even when prior biopsy has shown tumor to be present.  The risk of systemic spread is greater in stage III and therefore we generally stage these patients with CT scans and I have ordered these.  They are likely to be negative as just discussed.  We have a new type of PET scan, using fluoro18estradiol instead of glucose.  This is much more sensitive than the standard PET in lobular breast cancer cases and depending on the results of her prior CT scans we will consider proceeding to this particular study  If  work-up shows only stage III disease, then she will have completion axillary lymph node dissection and adjuvant radiation.  She will continue to have filling of her expander and likely definitive reconstruction after the radiation.  She will then be ready to start antiestrogens. We discussed the difference between tamoxifen and anastrozole in detail. She understands that anastrozole and the aromatase inhibitors in general work by blocking estrogen production. Accordingly vaginal dryness, decrease in bone density, and of course hot flashes can result. The aromatase inhibitors can also negatively affect the cholesterol profile, although that is a minor effect. One out of 5 women on aromatase inhibitors we will feel "old and achy". This arthralgia/myalgia syndrome, which resembles fibromyalgia clinically, does resolve with stopping the medications. Accordingly this is not a reason to not try an aromatase inhibitor but it is a frequent reason to stop it (in other words 20% of women will not be able to tolerate these medications).  Tamoxifen on the other hand does not block estrogen production. It does not "take away a woman's estrogen". It blocks the estrogen receptor in breast cells.  Like anastrozole, it can also cause hot flashes. As opposed to anastrozole, tamoxifen has many estrogen-like effects. It is technically an estrogen receptor modulator. This means that in some tissues tamoxifen works like estrogen-- for example it helps strengthen the bones. It tends to improve the cholesterol profile. It can cause thickening of the endometrial lining, and even endometrial polyps or rarely cancer of the uterus.(The risk of uterine cancer due to tamoxifen is one additional cancer per thousand women year). It can cause vaginal wetness or stickiness. It can cause blood clots through this estrogen-like effect--the risk of blood clots with tamoxifen is exactly the same as with birth control pills or hormone replacement.  Neither of these agents causes mood changes or weight gain, despite the popular belief that they can have these side effects. We have data from studies comparing either of these drugs with placebo, and in those cases the control group had the same amount of weight gain and depression as the group that took the drug.  In the end we decided that she would likely start with tamoxifen, but we will not start that until radiation has been completed.  Tentatively she will see me again in about a month, but of course I will contact her once we have the results of the scans and if necessary I will see her before then.  Total encounter time 65 minutes.Sarajane Jews C. Mikenna Bunkley, MD 11/22/2019 11:33 AM  Oncology and Hematology [facility] [place] Tel. [phone]  Joylene Igo [phone]   I, Wilburn Mylar, am acting as scribe for Dr. Virgie Dad. Wilfredo Canterbury.  I, Lurline Del MD, have reviewed the above documentation for accuracy and completeness, and I agree with the above.    *Total Encounter Time as defined by the Centers for Medicare and Medicaid Services includes, in addition to the face-to-face time of a patient visit (documented in the note above)  non-face-to-face time: obtaining and reviewing outside history, ordering and reviewing medications, tests or procedures, care coordination (communications with other health care professionals or caregivers) and documentation in the medical record.

## 2019-11-23 NOTE — Progress Notes (Signed)
Patient is a 59 year old female here for follow-up after left nipple sparing mastectomy with Dr. Donne Hazel and immediate reconstruction with placement of tissue expander with Dr. Marla Roe on 09/27/19.  Margins were not clear and she underwent reexcision of left breast margins and axillary node dissection on 11/14/2019 with Dr. Donne Hazel. Margins are now clear. She will follow-up with Dr. Donne Hazel on 4/6 for suture removal. She will meet with radiation team on 4/8 to determine treatment and timing.   Denies F, CP, SOB, N/V, breast pain. Sutures in place. Mild redness around sutures to NAC. All darkened areas have resolved. No signs of infection, drainage, seroma/hematoma.   We did not place injectable saline in the Expander using a sterile technique: Left: 0 cc for a total of 140 / 455 cc  Follow up in 10 days for fill. Call office with any questions/concerns.  The [place] was signed into law in 2016 which includes the topic of electronic health records.  This provides immediate access to information in MyChart.  This includes consultation notes, operative notes, office notes, lab results and pathology reports.  If you have any questions about what you read please let us know at your next visit or call us at the office.  We are right here with you.

## 2019-11-24 ENCOUNTER — Other Ambulatory Visit: Payer: Self-pay

## 2019-11-24 ENCOUNTER — Encounter: Payer: Self-pay | Admitting: Plastic Surgery

## 2019-11-24 ENCOUNTER — Ambulatory Visit (INDEPENDENT_AMBULATORY_CARE_PROVIDER_SITE_OTHER): Payer: BC Managed Care – PPO | Admitting: Plastic Surgery

## 2019-11-24 VITALS — BP 150/82 | HR 93 | Temp 97.7°F | Ht 64.0 in | Wt 171.6 lb

## 2019-11-24 DIAGNOSIS — C50512 Malignant neoplasm of lower-outer quadrant of left female breast: Secondary | ICD-10-CM

## 2019-11-24 DIAGNOSIS — Z170 Estrogen receptor positive status [ER+]: Secondary | ICD-10-CM

## 2019-11-24 DIAGNOSIS — Z9012 Acquired absence of left breast and nipple: Secondary | ICD-10-CM

## 2019-11-26 NOTE — Progress Notes (Signed)
[place]  [phone]) [phone] Fax:([phone]     ID: Natasha Graham DOB: 1961/07/28  MR#: [id number]  EXH#:[id number]  Patient Care Team: Leonides Sake, MD as PCP - General (Family Medicine) Rolm Bookbinder, MD as Consulting Physician (General Surgery) Moesha Sarchet, Virgie Dad, MD as Consulting Physician (Oncology) Mauro Kaufmann, RN as Registered Nurse Rockwell Germany, RN as Registered Nurse Dillingham, Loel Lofty, DO as Attending Physician (Plastic Surgery) OTHER MD: Sherrie Mustache MD   CHIEF COMPLAINT: new estrogen receptor positive left breast cancer; remote noninvasive right breast cancer (s/p bilateral mastectomies)  CURRENT TREATMENT: adjuvant radiation pending   INTERVAL HISTORY: Natasha Graham returns today for follow up of her newly diagnosed invasive left breast cancer.  Since her last visit, she underwent CT chest/abdomen/pelvis on 10/31/2019 showing: no definite evidence of metastatic disease; 3 mm indeterminate left upper lobe pulmonary nodule.  She also underwent left axillary lymph node dissection on 11/13/2019 under Dr. Donne Hazel. Pathology from the procedure ([id number]) was negative for malignancy in all seven lymph nodes (0/7).  She also underwent [place] PET scan on 11/20/2019, which showed: estrogen receptor avid radiotracer in a chain of small lymph nodes extending from the left axilla to the left supraclavicular nodal station; no evidence central intrathoracic nodal metastasis or distant visceral disease or skeletal disease.  She is scheduled to meet with radiation oncology, Dr. Sondra Come, 12/07/2019   REVIEW OF SYSTEMS: Natasha Graham just had the drain removed a few days ago.  She has some soreness and sensitivity but no real pain in the surgical breast.  She is seeing Dr. Tedra Coupe him but tells me she has not had a feeling of her expander for some time.  They are waiting until the stitches come out, which should be 12/05/2018.  She will see Dr.  Tedra Coupe him the same day.  Now that the weather is better [place]  Aside from these issues a detailed review of systems today was stable   LEFT BREAST CANCER HISTORY: From the original intake note:  She had routine screening left mammogram on 06/14/2019 showing a developing asymmetry. She presented for left diagnostic mammogram and left breast ultrasound on 07/07/2019. Physical exam that day showed a palpable, firm, superficial mass in the left breast at 6 o'clock along the site of reduction mammoplasty scar (performed in 2015 with right mastectomy). Scans showed: breast density category B; 2.1 cm superficial mass involving the skin in the left breast at 6 o'clock; no enlarged adenopathy in the left axilla.   Accordingly on 07/12/2019 she proceeded to biopsy of the left breast area in question. The pathology from this procedure ([id number]) showed: invasive lobular carcinoma, grade 1, with perineural invasion present. Prognostic indicators significant for: estrogen receptor, 60% positive with moderate staining intensity and progesterone receptor, 0% negative. Proliferation marker Ki67 at <1%. HER2 equvocal by immunohistochemistry (2+), but negative by fluorescent in situ hybridization with a signals ratio 1.18 and number per cell 1.95.  She opted to proceed with left mastectomy on 09/27/2019 under Dr. Donne Hazel with immediate reconstruction under Dr. Marla Roe. Pathology from the procedure [id number]) revealed: invasive lobular carcinoma, grade 2, 6.2 cm, focally involving anterior margin and involving skin dermis without involvement of epidermis; lobular carcinoma in situ with pagetoid spread; small focus of low grade ductal carcinoma in situ, 0.2 cm.  Two lymph nodes were biopsied. One showed metastatic lobular carcinoma with focus measuring 2.1 cm with evidence of extranodal extension. The other lymph node also showed  metastatic lobular carcinoma with no lymphoid  tissue present.  Biopsy of the nipple was also taken at that time and showed no evidence of invasive carcinoma.  Mammaprint was performed on the final surgical sample. This returned showing low risk.  RIGHT BREAST CANCER HISTORY: Natasha Graham has a history of right breast biopsy in late 2007 showing atypical ductal hyperplasia in the upper outer right breast.. Breast MRI 07/13/2006 showed postbiopsy changes in the upper right breast, and some right breast cysts. There were no solid masses however or areas of abnormal enhancement. There were no enlarged lymph nodes. Incidental finding of liver cysts was made. On 07/30/2006 she underwent needle localization of the calcifications in the outer right breast which also showed ([id number]) atypical ductal hyperplasia, with no malignancy.   On 10/21/2012 routine digital bilateral screening mammography at the breast Center showed breast density to be category C. In the right breast there were calcifications and architectural distortion to go with skin retraction. Additional views obtained 11/17/2012 showed extensive but stable punctate microcalcifications in the upper outer right breast. Many of these were superficial and not amenable to stereotactic biopsy. MRI was suggested but the patient was unable to undergo that test and therefore six-month mammography was obtained 05/15/2013. This showed the calcifications in the outer right breast to have been stable. Because of their extents and because of their superficial location they could not be all sampled all removed with biopsy. MRI was again discussed, but the patient has Harrington rods in place and also felt the cost of MRI was prohibitive.  On 11/13/2013 further six-month follow-up showed a grouping of intermediate microcalcifications in the lateral right breast and these were biopsied 11/20/2013. The result of that procedure ([id number]) showed atypical ductal hyperplasia. Accordingly on 02/23/2014 the patient  underwent right lumpectomy, with the pathology ([id number]) showing ductal carcinoma in situ, grade 2, measuring 0.7 cm. Estrogen receptor was 100% positive. Progesterone receptor was 96% positive. Both showed strong staining intensity. Margins were clear but the closest margin anteriorly was at 1 mm.  On 03/07/2014 the patient underwent postoperative mammography which showed post lumpectomy changes but also calcifications extending from the lumpectomy site anteriorly towards the nipple. On 03/12/2014 the patient underwent bilateral breast MRI. Breast composition here was described as category B. In the right breast there was a 4.8 cm fluidlike excisional cavity in the lower outer right breast. Adjacent to this was a focus of non-masslike enhancement measuring 1.9 cm. This is an area associated with the scar from the excision in 2007. There was also non-masslike enhancement inferior to the biopsy cavity measuring 3.8 cm. There were no abnormal lymph nodes and no masses or abnormal enhancement in the left breast. Specifically the left breast lower outer quadrant was unchanged from 2007 MRI.  Overall it was felt the linear non-masslike enhancement extending almost 4 cm inferior to the excisional cavity was sufficiently suspicious to warrant biopsy, and this was performed 03/29/2014. It again showed ductal carcinoma in situ, grade 1, estrogen receptor and progesterone receptor both 100% positive with strong staining intensity.  At this point the patient met with surgery and opted for definitive right mastectomy with immediate reconstruction. On 05/16/2014 she underwent simple mastectomy with sentinel lymph node sampling. The pathology ([id number]) showed atypical ductal hyperplasia but no evidence of neoplasia. All 5 sentinel lymph nodes were clear. The patient had immediate right breast reconstruction with tissue expander and dermal matrix placement.  She is scheduled for definitive implant placement  08/08/2014.  Her  subsequent history is as detailed below   PAST MEDICAL HISTORY: Past Medical History:  Diagnosis Date  . Cancer [facility]    breast cancer  . Hypertension   . PONV (postoperative nausea and vomiting)   . Scoliosis   . Wears glasses     PAST SURGICAL HISTORY: Past Surgical History:  Procedure Laterality Date  . AUGMENTATION MAMMAPLASTY Right    2015 post mastectomy  . AXILLARY LYMPH NODE DISSECTION Left 11/13/2019   Procedure: LEFT AXILLARY LYMPH NODE DISSECTION;  Surgeon: Rolm Bookbinder, MD;  Location: [place];  Service: General;  Laterality: Left;  . BREAST RECONSTRUCTION WITH PLACEMENT OF TISSUE EXPANDER AND FLEX HD (ACELLULAR HYDRATED DERMIS) Right 05/16/2014   Procedure: IMMEDIATE RIGHT BREAST RECONSTRUCTION WITH PLACEMENT OF TISSUE EXPANDER AND FLEX HD (ACELLULAR HYDRATED DERMIS);  Surgeon: Theodoro Kos, DO;  Location[place];  Service: Plastics;  Laterality: Right;  . BREAST RECONSTRUCTION WITH PLACEMENT OF TISSUE EXPANDER AND FLEX HD (ACELLULAR HYDRATED DERMIS) Left 09/27/2019   Procedure: LEFT BREAST RECONSTRUCTION WITH PLACEMENT OF TISSUE EXPANDER AND FLEX HD (ACELLULAR HYDRATED DERMIS);  Surgeon: Wallace Going, DO;  Location: [place];  Service: Plastics;  Laterality: Left;  . BREAST SURGERY     right breast excisional biopsy  . DILATION AND CURETTAGE OF UTERUS    . HYSTEROSCOPY WITH D & C N/A 09/20/2015   Procedure: DILATATION AND CURETTAGE /HYSTEROSCOPY;  Surgeon: Brien Few, MD;  Location: [place] [facility];  Service: Gynecology;  Laterality: N/A;  . LIPOSUCTION Bilateral 08/08/2014   Procedure: LIPO SUCTION ;  Surgeon: Theodoro Kos, DO;  Location: [place];  Service: Plastics;  Laterality: Bilateral;  . MASTECTOMY Right 05/16/2014   placement of acellular dermal matrix & tissue expanders   . MASTOPEXY Left 08/08/2014   Procedure:  MASTOPEXY FOR SYMMETRY;  Surgeon: Theodoro Kos, DO;  Location: [place];  Service: Plastics;  Laterality: Left;  . NIPPLE SPARING MASTECTOMY WITH SENTINEL LYMPH NODE BIOPSY Left 09/27/2019   Procedure: LEFT NIPPLE SPARING MASTECTOMY WITH LEFT AXILLARY SENTINEL LYMPH NODE BIOPSY;  Surgeon: Rolm Bookbinder, MD;  Location: [place];  Service: General;  Laterality: Left;  . REDUCTION MAMMAPLASTY Left    2015  . REMOVAL OF TISSUE EXPANDER AND PLACEMENT OF IMPLANT Right 08/08/2014   Procedure: REMOVAL OF RIGHT  TISSUE EXPANDERS WITH PLACEMENT OF RIGHT BREAST IMPLANTS WITH LIPO SUCTION ;  Surgeon: Theodoro Kos, DO;  Location: [place];  Service: Plastics;  Laterality: Right;  . SCAR REVISION Left 11/13/2019   Procedure: EXCISION OF LEFT MASTECTOMY SKIN;  Surgeon: Rolm Bookbinder, MD;  Location: [place];  Service: General;  Laterality: Left;  . scoliosis  1972   harrington rods-age 10  . TONSILLECTOMY      FAMILY HISTORY Family History  Problem Relation Age of Onset  . Cancer Father        liver  . Breast cancer Paternal Aunt    there is significant cancer history on the paternal side, her father dying at the age of 45 with what the patient tells me was metastatic head and neck cancer (but also involving the colon and liver). One of the patient's father's brothers had stomach cancer, and another had a brain tumor, and 2 of the patient's father's sisters had breast cancer, 1 diagnosed at age 54, the other at age 67.  On the mother's side there is a history of non-Hodgkin's lymphoma at age 34. There is  no history of ovarian cancer in the family   GYNECOLOGIC HISTORY:  Patient's last menstrual period was 10/16/2013.  menarche age 41, first live birth age 89. The patient is GX P3. She stopped having periods in October 2014. She did not take hormone replacement. She did take birth control pills for approximately 1 year remotely, with no complications.   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 She works as an Web designer. Son Truman Hayward drives a truck for the family business. Daughter Thayer Headings is a Forensic psychologist and currently works as an IT trainer in [place].  Daughter Roselyn Reef is studying history in [place] at [employer]. The patient has 2 granddaughters aged 17 and 1 as of May 2021. She attends [employer]    ADVANCED DIRECTIVES: In the absence of any documents to the contrary the patient's husband is her healthcare power of attorney  HEALTH MAINTENANCE: Social History   Tobacco Use  . Smoking status: Never Smoker  . Smokeless tobacco: Never Used  Substance Use Topics  . Alcohol use: No  . Drug use: No     Colonoscopy:  2013  PAP: March 2015  Bone density:  Lipid panel:  No Known Allergies  Current Outpatient Medications  Medication Sig Dispense Refill  . Ascorbic Acid (VITAMIN C) 100 MG tablet Take 100 mg by mouth daily.    . Flaxseed Oil (LINSEED OIL) OIL by Misc.(Non-Drug; Combo Route) route.    . Ginger, Zingiber officinalis, (GINGER PO) Take by mouth.    Marland Kitchen lisinopril-hydrochlorothiazide (PRINZIDE,ZESTORETIC) 20-25 MG per tablet Take 1 tablet by mouth every evening.     . Multiple Vitamin (MULTIVITAMIN) capsule Take by mouth.    . Turmeric (QC TUMERIC COMPLEX) 500 MG CAPS Take by mouth.     No current facility-administered medications for this visit.    OBJECTIVE: White woman who appears younger than stated age 51:   11/27/19 1129  BP: (!) 151/68  Pulse: 91  Resp: 18  Temp: 98.3 F (36.8 C)  SpO2: 100%     Body mass index is 29.49 kg/m.    ECOG FS:1 - Symptomatic but completely ambulatory  Sclerae unicteric, EOMs intact Wearing a mask No cervical or supraclavicular adenopathy Lungs no rales or rhonchi Heart regular rate and rhythm Abd soft, nontender, positive bowel sounds MSK no focal spinal tenderness, no left upper extremity  lymphedema Neuro: nonfocal, well oriented, appropriate affect Breasts: The right breast is status post remote mastectomy with saline reconstruction.  There is no evidence of disease recurrence.  The left breast is status post recent mastectomy with expander in place.  The initial cosmetic impression is good.  Stitches are still in place.  There is no dehiscence.  Both axillae are benign.   LAB RESULTS:  CMP     Component Value Date/Time   NA 143 11/27/2019 1112   NA 143 07/30/2014 1603   K 3.6 11/27/2019 1112   K 3.5 07/30/2014 1603   CL 103 11/27/2019 1112   CO2 30 11/27/2019 1112   CO2 30 (H) 07/30/2014 1603   GLUCOSE 102 (H) 11/27/2019 1112   GLUCOSE 93 07/30/2014 1603   BUN 10 11/27/2019 1112   BUN 10.5 07/30/2014 1603   CREATININE 0.75 11/27/2019 1112   CREATININE 0.9 07/30/2014 1603   CALCIUM 9.1 11/27/2019 1112   CALCIUM 9.5 07/30/2014 1603   PROT 7.1 11/27/2019 1112   PROT 7.0 07/30/2014 1603  ALBUMIN 3.9 11/27/2019 1112   ALBUMIN 4.0 07/30/2014 1603   AST 18 11/27/2019 1112   AST 23 07/30/2014 1603   ALT 26 11/27/2019 1112   ALT 23 07/30/2014 1603   ALKPHOS 96 11/27/2019 1112   ALKPHOS 78 07/30/2014 1603   BILITOT 0.4 11/27/2019 1112   BILITOT 0.29 07/30/2014 1603   GFRNONAA >60 11/27/2019 1112   GFRAA >60 11/27/2019 1112    INo results found for: SPEP, UPEP  Lab Results  Component Value Date   WBC 6.6 11/27/2019   NEUTROABS 4.0 11/27/2019   HGB 14.0 11/27/2019   HCT 40.5 11/27/2019   MCV 93.1 11/27/2019   PLT 338 11/27/2019      Chemistry      Component Value Date/Time   NA 143 11/27/2019 1112   NA 143 07/30/2014 1603   K 3.6 11/27/2019 1112   K 3.5 07/30/2014 1603   CL 103 11/27/2019 1112   CO2 30 11/27/2019 1112   CO2 30 (H) 07/30/2014 1603   BUN 10 11/27/2019 1112   BUN 10.5 07/30/2014 1603   CREATININE 0.75 11/27/2019 1112   CREATININE 0.9 07/30/2014 1603      Component Value Date/Time   CALCIUM 9.1 11/27/2019 1112   CALCIUM 9.5  07/30/2014 1603   ALKPHOS 96 11/27/2019 1112   ALKPHOS 78 07/30/2014 1603   AST 18 11/27/2019 1112   AST 23 07/30/2014 1603   ALT 26 11/27/2019 1112   ALT 23 07/30/2014 1603   BILITOT 0.4 11/27/2019 1112   BILITOT 0.29 07/30/2014 1603      No results found for: LABCA2  No components found for: LABCA125  No results for input(s): INR in the last 168 hours.  Urinalysis No results found for: COLORURINE, APPEARANCEUR, LABSPEC, PHURINE, GLUCOSEU, HGBUR, BILIRUBINUR, KETONESUR, PROTEINUR, UROBILINOGEN, NITRITE, LEUKOCYTESUR   STUDIES: CT Chest W Contrast  Result Date: 10/31/2019 CLINICAL DATA:  Left breast carcinoma. Recent left mastectomy. Staging. EXAM: CT CHEST, ABDOMEN, AND PELVIS WITH CONTRAST TECHNIQUE: Multidetector CT imaging of the chest, abdomen and pelvis was performed following the standard protocol during bolus administration of intravenous contrast. CONTRAST:  174m OMNIPAQUE IOHEXOL 300 MG/ML  SOLN COMPARISON:  None. FINDINGS: CT CHEST FINDINGS Cardiovascular: No acute findings. Aortic atherosclerosis incidentally noted. Mediastinum/Lymph Nodes: No masses or pathologically enlarged lymph nodes identified. Bilateral breast implants are noted, and no axillary lymphadenopathy identified. Lungs/Pleura: 3 mm pulmonary nodule seen in the medial left lung apex on image 22/6. No other suspicious pulmonary nodules or masses identified. No evidence of pulmonary infiltrate or pleural effusion. Musculoskeletal: No suspicious bone lesions identified. Thoracic posterior spinal fixation rods noted. CT ABDOMEN AND PELVIS FINDINGS Hepatobiliary: No masses identified. Two small cysts are noted. Mild hepatic steatosis also demonstrated. Gallbladder is unremarkable. No evidence of biliary ductal dilatation. Pancreas:  No mass or inflammatory changes. Spleen:  Within normal limits in size and appearance. Adrenals/Urinary tract: No masses or hydronephrosis. Ptotic left kidney noted which is located in the  left lower quadrant. Stomach/Bowel: No evidence of obstruction, inflammatory process, or abnormal fluid collections. Normal appendix visualized. Vascular/Lymphatic: No pathologically enlarged lymph nodes identified. No abdominal aortic aneurysm. Reproductive: 2 small uterine fibroids noted, largest measuring 2.8 cm. Adnexal regions are unremarkable. Other: Small midline epigastric ventral hernia containing only omental fat. Musculoskeletal:  No suspicious bone lesions identified. IMPRESSION: 1. No definite evidence of metastatic disease within the chest, abdomen, or pelvis. 2. 3 mm indeterminate left upper lobe pulmonary nodule. Recommend continued followup by chest CT in 6 months.  3. Mild hepatic steatosis. 4. Small uterine fibroids. 5. Small epigastric ventral hernia containing only omental fat. Electronically Signed   By: Marlaine Hind M.D.   On: 10/31/2019 10:44   CT Abdomen Pelvis W Contrast  Result Date: 10/31/2019 CLINICAL DATA:  Left breast carcinoma. Recent left mastectomy. Staging. EXAM: CT CHEST, ABDOMEN, AND PELVIS WITH CONTRAST TECHNIQUE: Multidetector CT imaging of the chest, abdomen and pelvis was performed following the standard protocol during bolus administration of intravenous contrast. CONTRAST:  172m OMNIPAQUE IOHEXOL 300 MG/ML  SOLN COMPARISON:  None. FINDINGS: CT CHEST FINDINGS Cardiovascular: No acute findings. Aortic atherosclerosis incidentally noted. Mediastinum/Lymph Nodes: No masses or pathologically enlarged lymph nodes identified. Bilateral breast implants are noted, and no axillary lymphadenopathy identified. Lungs/Pleura: 3 mm pulmonary nodule seen in the medial left lung apex on image 22/6. No other suspicious pulmonary nodules or masses identified. No evidence of pulmonary infiltrate or pleural effusion. Musculoskeletal: No suspicious bone lesions identified. Thoracic posterior spinal fixation rods noted. CT ABDOMEN AND PELVIS FINDINGS Hepatobiliary: No masses identified. Two  small cysts are noted. Mild hepatic steatosis also demonstrated. Gallbladder is unremarkable. No evidence of biliary ductal dilatation. Pancreas:  No mass or inflammatory changes. Spleen:  Within normal limits in size and appearance. Adrenals/Urinary tract: No masses or hydronephrosis. Ptotic left kidney noted which is located in the left lower quadrant. Stomach/Bowel: No evidence of obstruction, inflammatory process, or abnormal fluid collections. Normal appendix visualized. Vascular/Lymphatic: No pathologically enlarged lymph nodes identified. No abdominal aortic aneurysm. Reproductive: 2 small uterine fibroids noted, largest measuring 2.8 cm. Adnexal regions are unremarkable. Other: Small midline epigastric ventral hernia containing only omental fat. Musculoskeletal:  No suspicious bone lesions identified. IMPRESSION: 1. No definite evidence of metastatic disease within the chest, abdomen, or pelvis. 2. 3 mm indeterminate left upper lobe pulmonary nodule. Recommend continued followup by chest CT in 6 months. 3. Mild hepatic steatosis. 4. Small uterine fibroids. 5. Small epigastric ventral hernia containing only omental fat. Electronically Signed   By: JMarlaine HindM.D.   On: 10/31/2019 10:44   NM PET ([facility]) WHOLE BODY  Result Date: 11/21/2019 CLINICAL DATA:  Estrogen receptor LEFT breast cancer. Invasive lobular carcinoma. Remote history of RIGHT breast cancer invasive lobular carcinoma. LEFT axillary lymph node dissection 11/13/2019. EXAM: NUCLEAR MEDICINE PET WHOLE BODY TECHNIQUE: 6.51 mCi F-18 Estradiol was injected intravenously. Full-ring PET imaging was performed from the vertex to feet after the radiotracer. CT data was obtained and used for attenuation correction and anatomic localization. COMPARISON:  Chest CT 10/31/2019 FINDINGS: Mediastinal blood pool activity: 1.4 HEAD/NECK: No abnormal radiotracer accumulation the brain or within the cervical nodes. Incidental CT findings: None CHEST: There  is a chain of small lymph nodes beginning in the level sub pectoralis nodal station and extending to the LEFT supraclavicular nodal station which accumulate the estrogen receptor avid radiotracer. These lymph nodes are small and number approximately 4 nodes. For example small LEFT supraclavicular node adjacent to the thyroid gland measures 5 mm (image 66/4 with SUV max equal 4.8. More inferior node present on PET image 70 difficult define on the CT portion exam. LEFT sub pectoralis node measuring 5 mm (image 75/4) with SUV max equal 3.8 (PET image 70). Posterior triangle node (LEFT level V) which also difficult define on CT portion exam (image 59/4) with SUV max equal 3 point No abnormal activity in internal mammary lymph nodes. No activity in mediastinal or hilar nodes. NO RIGHT axillary nodal metastasis. Incidental CT findings: No suspicious pulmonary nodules. ABDOMEN/PELVIS:  No abnormal radiotracer accumulation the abdomen pelvis. Abnormal lymph nodes. No peritoneal disease. Physiologic activity noted in the liver, biliary tract, and bowel. Diffuse physiologic activity noted in the uterus. Incidental CT findings: Several benign-appearing cysts in the liver. SKELETON: No abnormal radiotracer accumulation Incidental CT findings: No lytic or sclerotic lesion. EXTREMITIES: No abnormal radiotracer accumulation Incidental CT findings: None IMPRESSION: 1. A chain of small lymph nodes extending from the LEFT axilla to the LEFT supraclavicular nodal station accumulate the estrogen receptor avid radiotracer and therefore are most consistent with ER postive breast cancer nodal metastasis. 2. No evidence central intrathoracic nodal metastasis. 3. No evidence distant visceral disease or skeletal disease. These results will be called to the ordering clinician or representative by the Radiologist Assistant, and communication documented in the PACS or [employer]. Electronically Signed   By: Suzy Bouchard M.D.   On:  11/21/2019 10:30      ASSESSMENT: 59 y.o. [place], [place] woman  RIGHT BREAST CANCER (1) status post right breast upper-outer quadrant lumpectomy 07/30/2006 showing atypical ductal hyperplasia.  (2) right breast upper outer quadrant biopsy 11/20/2013 showed atypical ductal hyperplasia   (3) right lumpectomy 02/26/2014 showed ductal carcinoma in situ measuring 0.7 cm, estrogen receptor 100% positive, progesterone receptor 96% positive, with 1 mm margins  (4) right breast lower inner quadrant biopsy 03/29/2014 showed ductal carcinoma in situ, 100% estrogen receptor positive, 100% progesterone receptor positive,  (5) status post right mastectomy with sentinel lymph node sampling showing only atypical ductal hyperplasia,  (a) 5 sentinel lymph nodes removed, all clear  (b) status post right saline implant reconstruction  (6) the patient opted against prophylactic antiestrogens   LEFT BREAST CANCER (7) status post left breast lower outer quadrant biopsy 07/12/2019 for a clinical T1N0, stage IA invasive lobular carcinoma, grade 1, with evidence of perineural invasion, estrogen receptor moderately positive, progesterone receptor negative, HER-2 not amplified, with an MIB-1 of less than 1%  (8) status post left nipple sparing mastectomy 09/27/2019 for a pT3 pN1, stage IIIA invasive lobular carcinoma, grade 2, with a negative nipple biopsy and a focally positive anterior margin  (a) 1 sentinel lymph node had a 2.1 cm tumor deposit; a second possible lymph node had no residual lymph node tissue  (b) immediate expander placement  (c) status post left axillary lymph node dissection and margin excision 11/13/2019, no residual disease   (i) 7 of 7 left axillary lymph nodes negative for carcinoma.    (9) MammaPrint "low risk" predicts no significant benefit from chemotherapy.   (10) adjuvant radiation pending  (11) staging studies:   (a) chest, abdomen and pelvic CT with contrast  (b) F18 estradiol  PET scan shows a chain of very small lymph nodes from the subpectoralis nodal station to the left supraclavicular nodal station positive for ER accumulation with SUVs in the 4 range (versus mediastinal blood pool 1.4). no evidence of lung, bone, or liver involvement.   (12) adjuvant tamoxifen to start at the completion of radiation  (13) genetics testing  (14) reconstruction in process   PLAN: Debbie did well with her surgery, and is looking forward to continuing filling of her expander with eventual replacement with a definitive implant.  She is also scheduled for adjuvant radiation.  We reviewed her scans.  The CT of the chest showed no abnormal adenopathy but the cerianna scan did show a chain of lymph nodes extending from the axilla to the left supraclavicular area which will need to be covered by her  radiation treatments.  She understands she does not need any further surgery for this.  Once she is done with radiation she will start tamoxifen.  She understands while on tamoxifen she will not be a candidate for additional cerianna scans.  However if we switch to anastrozole after 2 or 3 years on tamoxifen we could repeat a scan at that time and I would probably advise that at that point.  Repeating routine scans of course would not be helpful except in the setting of new symptoms develop.  She has a good understanding of the overall plan.  She will start tamoxifen most likely in early June.  She will return to see me in early September or late August and if she tolerates tamoxifen well the plan will be stated to continue 2 or 3 years and then switch to an aromatase inhibitor  Total encounter time 35 minutes.Sarajane Jews C. Meline Russaw, MD 11/27/2019 12:02 PM  Oncology and Hematology [facility] [place], [place] [address] Tel. [phone]  Joylene Igo [phone]   I, Wilburn Mylar, am acting as scribe for Dr. Sarajane Jews C. Sydne Krahl.  I, Lurline Del MD, have  reviewed the above documentation for accuracy and completeness, and I agree with the above.   *Total Encounter Time as defined by the Centers for Medicare and Medicaid Services includes, in addition to the face-to-face time of a patient visit (documented in the note above) non-face-to-face time: obtaining and reviewing outside history, ordering and reviewing medications, tests or procedures, care coordination (communications with other health care professionals or caregivers) and documentation in the medical record.

## 2019-11-27 ENCOUNTER — Inpatient Hospital Stay: Payer: BC Managed Care – PPO | Attending: Oncology

## 2019-11-27 ENCOUNTER — Telehealth: Payer: Self-pay | Admitting: Oncology

## 2019-11-27 ENCOUNTER — Other Ambulatory Visit: Payer: Self-pay

## 2019-11-27 ENCOUNTER — Inpatient Hospital Stay (HOSPITAL_BASED_OUTPATIENT_CLINIC_OR_DEPARTMENT_OTHER): Payer: BC Managed Care – PPO | Admitting: Oncology

## 2019-11-27 VITALS — BP 151/68 | HR 91 | Temp 98.3°F | Resp 18 | Ht 64.0 in | Wt 171.8 lb

## 2019-11-27 DIAGNOSIS — Z79899 Other long term (current) drug therapy: Secondary | ICD-10-CM | POA: Diagnosis not present

## 2019-11-27 DIAGNOSIS — Z170 Estrogen receptor positive status [ER+]: Secondary | ICD-10-CM

## 2019-11-27 DIAGNOSIS — C50512 Malignant neoplasm of lower-outer quadrant of left female breast: Secondary | ICD-10-CM | POA: Insufficient documentation

## 2019-11-27 DIAGNOSIS — C50912 Malignant neoplasm of unspecified site of left female breast: Secondary | ICD-10-CM

## 2019-11-27 DIAGNOSIS — Z853 Personal history of malignant neoplasm of breast: Secondary | ICD-10-CM | POA: Diagnosis not present

## 2019-11-27 DIAGNOSIS — Z9013 Acquired absence of bilateral breasts and nipples: Secondary | ICD-10-CM | POA: Diagnosis not present

## 2019-11-27 LAB — COMPREHENSIVE METABOLIC PANEL
ALT: 26 U/L (ref 0–44)
AST: 18 U/L (ref 15–41)
Albumin: 3.9 g/dL (ref 3.5–5.0)
Alkaline Phosphatase: 96 U/L (ref 38–126)
Anion gap: 10 (ref 5–15)
BUN: 10 mg/dL (ref 6–20)
CO2: 30 mmol/L (ref 22–32)
Calcium: 9.1 mg/dL (ref 8.9–10.3)
Chloride: 103 mmol/L (ref 98–111)
Creatinine, Ser: 0.75 mg/dL (ref 0.44–1.00)
GFR calc Af Amer: >60 mL/min (ref >60)
GFR calc non Af Amer: >60 mL/min (ref >60)
Glucose, Bld: 102 mg/dL — ABNORMAL HIGH (ref 70–99)
Potassium: 3.6 mmol/L (ref 3.5–5.1)
Sodium: 143 mmol/L (ref 135–145)
Total Bilirubin: 0.4 mg/dL (ref 0.3–1.2)
Total Protein: 7.1 g/dL (ref 6.5–8.1)

## 2019-11-27 LAB — CBC WITH DIFFERENTIAL/PLATELET
Abs Immature Granulocytes: 0.02 10*3/uL (ref 0.00–0.07)
Basophils Absolute: 0 10*3/uL (ref 0.0–0.1)
Basophils Relative: 1 %
Eosinophils Absolute: 0.2 10*3/uL (ref 0.0–0.5)
Eosinophils Relative: 2 %
HCT: 40.5 % (ref 36.0–46.0)
Hemoglobin: 14 g/dL (ref 12.0–15.0)
Immature Granulocytes: 0 %
Lymphocytes Relative: 29 %
Lymphs Abs: 1.9 10*3/uL (ref 0.7–4.0)
MCH: 32.2 pg (ref 26.0–34.0)
MCHC: 34.6 g/dL (ref 30.0–36.0)
MCV: 93.1 fL (ref 80.0–100.0)
Monocytes Absolute: 0.5 10*3/uL (ref 0.1–1.0)
Monocytes Relative: 7 %
Neutro Abs: 4 10*3/uL (ref 1.7–7.7)
Neutrophils Relative %: 61 %
Platelets: 338 10*3/uL (ref 150–400)
RBC: 4.35 MIL/uL (ref 3.87–5.11)
RDW: 11.9 % (ref 11.5–15.5)
WBC: 6.6 10*3/uL (ref 4.0–10.5)
nRBC: 0 % (ref 0.0–0.2)

## 2019-11-27 NOTE — Telephone Encounter (Signed)
Scheduled appts per 3/29 los. Pt confirmed new appt date and time.

## 2019-12-05 ENCOUNTER — Ambulatory Visit (INDEPENDENT_AMBULATORY_CARE_PROVIDER_SITE_OTHER): Payer: BC Managed Care – PPO | Admitting: Surgical

## 2019-12-05 ENCOUNTER — Other Ambulatory Visit: Payer: Self-pay

## 2019-12-05 ENCOUNTER — Encounter: Payer: Self-pay | Admitting: Surgical

## 2019-12-05 VITALS — BP 163/82 | HR 75 | Temp 98.7°F | Ht 64.0 in | Wt 172.0 lb

## 2019-12-05 DIAGNOSIS — C50512 Malignant neoplasm of lower-outer quadrant of left female breast: Secondary | ICD-10-CM

## 2019-12-05 DIAGNOSIS — Z9012 Acquired absence of left breast and nipple: Secondary | ICD-10-CM

## 2019-12-05 DIAGNOSIS — Z170 Estrogen receptor positive status [ER+]: Secondary | ICD-10-CM

## 2019-12-05 NOTE — Progress Notes (Signed)
Patient is a 59 year old female here for follow-up after left nipple sparing mastectomy by Dr. Donne Hazel and immediate reconstruction with placement of tissue expander by Dr. Marla Roe on 09/27/2019. Patient subsequently underwent reexcision of left breast margins and axillary node dissection on 11/14/2019 by Dr. Donne Hazel. Patient reports she saw Dr. Donne Hazel today, sutures were removed, Steri-Strips placed over where sutures were removed. Patient is planning to meet with radiation team in 2 days to determine radiation treatment and timing.  On exam today her left breast incision has Steri-Strips over where her sutures were removed. I removed the Steri-Strips to evaluate and patient had two wounds. Laterally she had a wound that was approximately 0.3 x 2 cm. Expander was not exposed. Granular base noted at base of wound bed. Medially she had a more superficial wound that is approximately 0.5 x 1 cm. No foul odor noted. Some serosanguineous drainage noted. Mild periwound erythema. Minimal pain with palpation.  Axillary incision well-healed without any periincisional erythema or dehiscence.  Plan:  Recommend following up in 1 week for reevaluation. I did remove 50 cc of fluid from her left breast expander today to decrease pressure on inframammary fold wound.  We did not place injectable saline in the expander today, I have removed injectable saline from the expander today using a sterile technique. Left: -50 cc for a total of 90cc / 455 cc  I do not believe she has any infection, seroma, hematoma. Recommend daily dressing changes with Vaseline, gauze. I did apply Adaptic, Vaseline, 4 x 4 gauze today in the office.  Follow-up in 1 week. Call with any questions or concerns. Will hold off on additional films at this time to avoid any increased dehiscence.  Patient is scheduled for radiation appointment in 2 days to determine radiation treatment plan. May need to hold off on additional fills until  after radiation depending on radiation start date. I explained to the patient that typically we do not do fills during radiation treatment to decrease chances of any complications. We will continue to follow and manage as needed pending radiation treatment plan.  Pictures were obtained of the patient and placed in the chart with the patient's or guardian's permission.

## 2019-12-07 ENCOUNTER — Ambulatory Visit
Admission: RE | Admit: 2019-12-07 | Discharge: 2019-12-07 | Disposition: A | Discharge status: Home / Self Care | Payer: BC Managed Care – PPO | Source: Ambulatory Visit | Attending: Radiation Oncology | Admitting: Radiation Oncology

## 2019-12-07 ENCOUNTER — Other Ambulatory Visit: Payer: Self-pay

## 2019-12-07 ENCOUNTER — Encounter: Payer: Self-pay | Admitting: Radiation Oncology

## 2019-12-07 VITALS — BP 163/80 | HR 106 | Temp 99.4°F | Resp 20 | Ht 64.0 in | Wt 171.0 lb

## 2019-12-07 DIAGNOSIS — Z9012 Acquired absence of left breast and nipple: Secondary | ICD-10-CM | POA: Diagnosis not present

## 2019-12-07 DIAGNOSIS — Z79899 Other long term (current) drug therapy: Secondary | ICD-10-CM | POA: Diagnosis not present

## 2019-12-07 DIAGNOSIS — Z170 Estrogen receptor positive status [ER+]: Secondary | ICD-10-CM | POA: Diagnosis not present

## 2019-12-07 DIAGNOSIS — I10 Essential (primary) hypertension: Secondary | ICD-10-CM | POA: Insufficient documentation

## 2019-12-07 DIAGNOSIS — Z803 Family history of malignant neoplasm of breast: Secondary | ICD-10-CM | POA: Insufficient documentation

## 2019-12-07 DIAGNOSIS — C50512 Malignant neoplasm of lower-outer quadrant of left female breast: Secondary | ICD-10-CM | POA: Diagnosis not present

## 2019-12-07 DIAGNOSIS — C773 Secondary and unspecified malignant neoplasm of axilla and upper limb lymph nodes: Secondary | ICD-10-CM | POA: Diagnosis not present

## 2019-12-07 NOTE — Patient Instructions (Signed)
Coronavirus (COVID-19) Are you at risk?  Are you at risk for the Coronavirus (COVID-19)?  To be considered HIGH RISK for Coronavirus (COVID-19), you have to meet the following criteria:  . Traveled to [place], [place], [place], [place] or [place]; or in the [place] to [place], [place], [place], or [place]; and have fever, cough, and shortness of breath within the last 2 weeks of travel OR . Been in close contact with a person diagnosed with COVID-19 within the last 2 weeks and have fever, cough, and shortness of breath . IF YOU DO NOT MEET THESE CRITERIA, YOU ARE CONSIDERED LOW RISK FOR COVID-19.  What to do if you are HIGH RISK for COVID-19?  . If you are having a medical emergency, call 911. . Seek medical care right away. Before you go to a doctor's office, urgent care or emergency department, call ahead and tell them about your recent travel, contact with someone diagnosed with COVID-19, and your symptoms. You should receive instructions from your physician's office regarding next steps of care.  . When you arrive at healthcare provider, tell the healthcare staff immediately you have returned from visiting [place], [place], [place], [place] or [place]; or traveled in the [place] to [place], [place], [place], or [place]; in the last two weeks or you have been in close contact with a person diagnosed with COVID-19 in the last 2 weeks.   . Tell the health care staff about your symptoms: fever, cough and shortness of breath. . After you have been seen by a medical provider, you will be either: o Tested for (COVID-19) and discharged home on quarantine except to seek medical care if symptoms worsen, and asked to  - Stay home and avoid contact with others until you get your results (4-5 days)  - Avoid travel on public transportation if possible (such as bus, train, or airplane) or o Sent to the Emergency Department by EMS for evaluation, COVID-19 testing, and possible  admission depending on your condition and test results.  What to do if you are LOW RISK for COVID-19?  Reduce your risk of any infection by using the same precautions used for avoiding the common cold or flu:  . Wash your hands often with soap and warm water for at least 20 seconds.  If soap and water are not readily available, use an alcohol-based hand sanitizer with at least 60% alcohol.  . If coughing or sneezing, cover your mouth and nose by coughing or sneezing into the elbow areas of your shirt or coat, into a tissue or into your sleeve (not your hands). . Avoid shaking hands with others and consider head nods or verbal greetings only. . Avoid touching your eyes, nose, or mouth with unwashed hands.  . Avoid close contact with people who are sick. . Avoid places or events with large numbers of people in one location, like concerts or sporting events. . Carefully consider travel plans you have or are making. . If you are planning any travel outside or inside the [place], visit the CDC's Travelers' Health webpage for the latest health notices. . If you have some symptoms but not all symptoms, continue to monitor at home and seek medical attention if your symptoms worsen. . If you are having a medical emergency, call 911.   ADDITIONAL HEALTHCARE OPTIONS FOR PATIENTS  [place] / e-Visit: [email or web address]         [facility]: [phone]  [facility]: [phone]                   [facility]: [phone]   

## 2019-12-07 NOTE — Progress Notes (Signed)
Radiation Oncology         [phone] ________________________________  Initial Outpatient Consultation  Name: Natasha Graham MRN: [id number]  Date: 12/07/2019  DOB: 10/13/1960  YJ:EHUDJSH, Lorin Mercy, MD  Magrinat, Virgie Dad, MD   REFERRING PHYSICIAN: Magrinat, Virgie Dad, MD  DIAGNOSIS: The encounter diagnosis was Malignant neoplasm of lower-outer quadrant of left breast of female, estrogen receptor positive ([place]).  Stage IIIA (pT3, pN1a) Left Breast LOQ, Invasive Lobular Carcinoma with LCIS, ER+ / PR- / Her2-, Grade 2  HISTORY OF PRESENT ILLNESS::Natasha Graham is a 59 y.o. female who is accompanied by no one due to COVID-19 restrictions. She has a history of right breast noninvasive breast cancer diagnosed in 01/2014. After another right breast biopsy performed a month later also showed DCIS, she underwent right mastectomy in 05/2014.  She had routine screening mammography showing a possible abnormality in the left breast. She underwent left diagnostic mammography and left breast ultrasonography on 07/07/2019 showing: superficial 2.1 cm mass in left breast at 6 o'clock.  Biopsy on 07/12/2019 showed: invasive lobular carcinoma, grade 1, with perineural invasion. Prognostic indicators significant for: estrogen receptor, 60% positive and progesterone receptor, 0% negative. Proliferation marker Ki67 at <1%. HER2 negative by FISH.  She opted to proceed with left nipple sparing mastectomy on 09/27/2019. Pathology showed: invasive lobular carcinoma, grade 2, 6.2 cm; focal involvement of anterior margin and skin dermis; LCIS with pagetoid spread; 0.2 cm focus of DCIS; both biopsied lymph nodes positive for metastatic carcinoma (2/2), one of which showed extranodal extension.  Mammaprint returned low risk.  She also underwent CT chest/abdomen/pelvis on 10/31/2019 showing: no definite evidence of metastatic disease; 3 mm indeterminate left upper lobe pulmonary nodule.  She also underwent left  axillary lymph node dissection on 3/15/202. Pathology was negative for malignancy in all seven lymph nodes (0/7).  She also underwent excision of mastectomy margin 1.5 x 3 cm which was  Positive on the initial nipple sparing mastectomy.  Pathology from this area showed no residual malignancy of the skin.  She also underwent Cerianna PET scan on 11/20/2019, which showed: estrogen receptor avid radiotracer in a chain of small lymph nodes extending from the left axilla to the left supraclavicular nodal station; no evidence central intrathoracic nodal metastasis or distant visceral disease or skeletal disease.   PREVIOUS RADIATION THERAPY: No  PAST MEDICAL HISTORY:  Past Medical History:  Diagnosis Date  . Cancer [facility])    breast cancer  . Hypertension   . PONV (postoperative nausea and vomiting)   . Scoliosis   . Wears glasses     PAST SURGICAL HISTORY: Past Surgical History:  Procedure Laterality Date  . AUGMENTATION MAMMAPLASTY Right    2015 post mastectomy  . AXILLARY LYMPH NODE DISSECTION Left 11/13/2019   Procedure: LEFT AXILLARY LYMPH NODE DISSECTION;  Surgeon: Rolm Bookbinder, MD;  Location: [place];  Service: General;  Laterality: Left;  . BREAST RECONSTRUCTION WITH PLACEMENT OF TISSUE EXPANDER AND FLEX HD (ACELLULAR HYDRATED DERMIS) Right 05/16/2014   Procedure: IMMEDIATE RIGHT BREAST RECONSTRUCTION WITH PLACEMENT OF TISSUE EXPANDER AND FLEX HD (ACELLULAR HYDRATED DERMIS);  Surgeon: Theodoro Kos, DO;  Location: [place];  Service: Plastics;  Laterality: Right;  . BREAST RECONSTRUCTION WITH PLACEMENT OF TISSUE EXPANDER AND FLEX HD (ACELLULAR HYDRATED DERMIS) Left 09/27/2019   Procedure: LEFT BREAST RECONSTRUCTION WITH PLACEMENT OF TISSUE EXPANDER AND FLEX HD (ACELLULAR HYDRATED DERMIS);  Surgeon: Wallace Going, DO;  Location: [place];  Service: Plastics;  Laterality: Left;  . BREAST SURGERY     right breast excisional biopsy  . DILATION AND  CURETTAGE OF UTERUS    . HYSTEROSCOPY WITH D & C N/A 09/20/2015   Procedure: DILATATION AND CURETTAGE /HYSTEROSCOPY;  Surgeon: Brien Few, MD;  Location: [facility];  Service: Gynecology;  Laterality: N/A;  . LIPOSUCTION Bilateral 08/08/2014   Procedure: LIPO SUCTION ;  Surgeon: Theodoro Kos, DO;  Location: [place];  Service: Plastics;  Laterality: Bilateral;  . MASTECTOMY Right 05/16/2014   placement of acellular dermal matrix & tissue expanders   . MASTOPEXY Left 08/08/2014   Procedure:  MASTOPEXY FOR SYMMETRY;  Surgeon: Theodoro Kos, DO;  Location: [place];  Service: Plastics;  Laterality: Left;  . NIPPLE SPARING MASTECTOMY WITH SENTINEL LYMPH NODE BIOPSY Left 09/27/2019   Procedure: LEFT NIPPLE SPARING MASTECTOMY WITH LEFT AXILLARY SENTINEL LYMPH NODE BIOPSY;  Surgeon: Rolm Bookbinder, MD;  Location: [place];  Service: General;  Laterality: Left;  . REDUCTION MAMMAPLASTY Left    2015  . REMOVAL OF TISSUE EXPANDER AND PLACEMENT OF IMPLANT Right 08/08/2014   Procedure: REMOVAL OF RIGHT  TISSUE EXPANDERS WITH PLACEMENT OF RIGHT BREAST IMPLANTS WITH LIPO SUCTION ;  Surgeon: Theodoro Kos, DO;  Location: [place];  Service: Plastics;  Laterality: Right;  . SCAR REVISION Left 11/13/2019   Procedure: EXCISION OF LEFT MASTECTOMY SKIN;  Surgeon: Rolm Bookbinder, MD;  Location: [place];  Service: General;  Laterality: Left;  . scoliosis  1972   harrington rods-age 19  . TONSILLECTOMY      FAMILY HISTORY:  Family History  Problem Relation Age of Onset  . Cancer Father        liver  . Breast cancer Paternal Aunt     SOCIAL HISTORY:  Social History   Tobacco Use  . Smoking status: Never Smoker  . Smokeless tobacco: Never Used  Substance Use Topics  . Alcohol use: No  . Drug use: No    ALLERGIES: No Known Allergies  MEDICATIONS:  Current Outpatient Medications  Medication Sig Dispense Refill    . acetaminophen (TYLENOL) 500 MG tablet Take by mouth.    . Ascorbic Acid (VITAMIN C) 100 MG tablet Take 100 mg by mouth daily.    . Flaxseed Oil (LINSEED OIL) OIL by Misc.(Non-Drug; Combo Route) route.    . Ginger, Zingiber officinalis, (GINGER PO) Take by mouth.    Marland Kitchen ibuprofen (ADVIL) 200 MG tablet Take by mouth.    Marland Kitchen lisinopril-hydrochlorothiazide (PRINZIDE,ZESTORETIC) 20-25 MG per tablet Take 1 tablet by mouth every evening.     . Magnesium 250 MG TABS Take by mouth.    . Multiple Vitamin (MULTIVITAMIN) capsule Take by mouth.    . Turmeric (QC TUMERIC COMPLEX) 500 MG CAPS Take by mouth.     No current facility-administered medications for this encounter.    REVIEW OF SYSTEMS:  A 10+ POINT REVIEW OF SYSTEMS WAS OBTAINED including neurology, dermatology, psychiatry, cardiac, respiratory, lymph, extremities, GI, GU, musculoskeletal, constitutional, reproductive, HEENT. She denies lymphedema.   PHYSICAL EXAM:  height is '5\' 4"'  (1.626 m) and weight is 171 lb (77.6 kg). Her temperature is 99.4 F (37.4 C). Her blood pressure is 163/80 (abnormal) and her pulse is 106 (abnormal). Her respiration is 20 and oxygen saturation is 100%.   General: Alert and oriented, in no acute distress HEENT: Head is normocephalic. Extraocular movements are intact.  Neck: Neck is supple, no palpable cervical or  supraclavicular lymphadenopathy. Heart: Regular in rate and rhythm with no murmurs, rubs, or gallops. Chest: Clear to auscultation bilaterally, with no rhonchi, wheezes, or rales. Abdomen: Soft, nontender, nondistended, with no rigidity or guarding. Extremities: No cyanosis or edema. Lymphatics: see Neck Exam Skin: No concerning lesions. Musculoskeletal: symmetric strength and muscle tone throughout. Neurologic: Cranial nerves II through XII are grossly intact. No obvious focalities. Speech is fluent. Coordination is intact. Psychiatric: Judgment and insight are intact. Affect is appropriate. Right  Breast: Reconstructed with no palpable or visible signs of recurrence Left Breast: Approximately 5 cm scar along the inferior aspect of the reconstructed breast.  Some slight separation, no drainage or signs of infection.  Architectural distortion of the left nipple likely related to additional surgery required.  Axillary scar healing well  ECOG = 1  0 - Asymptomatic (Fully active, able to carry on all predisease activities without restriction)  1 - Symptomatic but completely ambulatory (Restricted in physically strenuous activity but ambulatory and able to carry out work of a light or sedentary nature. For example, light housework, office work)  2 - Symptomatic, <50% in bed during the day (Ambulatory and capable of all self care but unable to carry out any work activities. Up and about more than 50% of waking hours)  3 - Symptomatic, >50% in bed, but not bedbound (Capable of only limited self-care, confined to bed or chair 50% or more of waking hours)  4 - Bedbound (Completely disabled. Cannot carry on any self-care. Totally confined to bed or chair)  5 - Death   Eustace Pen MM, Creech RH, Tormey DC, et al. [phone]). "Toxicity and response criteria of the [facility]". [place] Oncol. 5 (6): 649-55  LABORATORY DATA:  Lab Results  Component Value Date   WBC 6.6 11/27/2019   HGB 14.0 11/27/2019   HCT 40.5 11/27/2019   MCV 93.1 11/27/2019   PLT 338 11/27/2019   NEUTROABS 4.0 11/27/2019   Lab Results  Component Value Date   NA 143 11/27/2019   K 3.6 11/27/2019   CL 103 11/27/2019   CO2 30 11/27/2019   GLUCOSE 102 (H) 11/27/2019   CREATININE 0.75 11/27/2019   CALCIUM 9.1 11/27/2019      RADIOGRAPHY: NM PET (CERIANNA) WHOLE BODY  Result Date: 11/21/2019 CLINICAL DATA:  Estrogen receptor LEFT breast cancer. Invasive lobular carcinoma. Remote history of RIGHT breast cancer invasive lobular carcinoma. LEFT axillary lymph node dissection 11/13/2019. EXAM: NUCLEAR  MEDICINE PET WHOLE BODY TECHNIQUE: 6.51 mCi F-18 Estradiol was injected intravenously. Full-ring PET imaging was performed from the vertex to feet after the radiotracer. CT data was obtained and used for attenuation correction and anatomic localization. COMPARISON:  Chest CT 10/31/2019 FINDINGS: Mediastinal blood pool activity: 1.4 HEAD/NECK: No abnormal radiotracer accumulation the brain or within the cervical nodes. Incidental CT findings: None CHEST: There is a chain of small lymph nodes beginning in the level sub pectoralis nodal station and extending to the LEFT supraclavicular nodal station which accumulate the estrogen receptor avid radiotracer. These lymph nodes are small and number approximately 4 nodes. For example small LEFT supraclavicular node adjacent to the thyroid gland measures 5 mm (image 66/4 with SUV max equal 4.8. More inferior node present on PET image 70 difficult define on the CT portion exam. LEFT sub pectoralis node measuring 5 mm (image 75/4) with SUV max equal 3.8 (PET image 70). Posterior triangle node (LEFT level V) which also difficult define on CT portion exam (image 59/4) with SUV  max equal 3 point No abnormal activity in internal mammary lymph nodes. No activity in mediastinal or hilar nodes. NO RIGHT axillary nodal metastasis. Incidental CT findings: No suspicious pulmonary nodules. ABDOMEN/PELVIS: No abnormal radiotracer accumulation the abdomen pelvis. Abnormal lymph nodes. No peritoneal disease. Physiologic activity noted in the liver, biliary tract, and bowel. Diffuse physiologic activity noted in the uterus. Incidental CT findings: Several benign-appearing cysts in the liver. SKELETON: No abnormal radiotracer accumulation Incidental CT findings: No lytic or sclerotic lesion. EXTREMITIES: No abnormal radiotracer accumulation Incidental CT findings: None IMPRESSION: 1. A chain of small lymph nodes extending from the LEFT axilla to the LEFT supraclavicular nodal station  accumulate the estrogen receptor avid radiotracer and therefore are most consistent with ER postive breast cancer nodal metastasis. 2. No evidence central intrathoracic nodal metastasis. 3. No evidence distant visceral disease or skeletal disease. These results will be called to the ordering clinician or representative by the Radiologist Assistant, and communication documented in the PACS or [employer]. Electronically Signed   By: Suzy Bouchard M.D.   On: 11/21/2019 10:30      IMPRESSION: Stage IIIA (pT3, pN1a) Left Breast LOQ, Invasive Lobular Carcinoma with LCIS, ER+ / PR- / Her2-, Grade 2  Patient was found locally advanced lobular breast cancer with initial surgery showing positive margin.  She underwent additional surgery to clear this margin.  Given the advanced stage of her disease the patient will be at risk for local regional recurrence and I would recommend postmastectomy radiation therapy in this situation.    Given the findings of nodal metastasis and  the Cerianna PET scan showing likely residual adenopathy in the subpectoral/ supraclavicular region, would  also recommend full coverage of the axillary region and supraclavicular region at the time of her chest wall radiation therapy.  Today, I talked to the patient  about the findings and work-up thus far.  We discussed the natural history of breast cancer and general treatment, highlighting the role of radiotherapy in the management.  We discussed the available radiation techniques, and focused on the details of logistics and delivery.  We reviewed the anticipated acute and late sequelae associated with radiation in this setting.  The patient was encouraged to ask questions that I answered to the best of my ability.  A patient consent form was discussed and signed.  We retained a copy for our records.  The patient would like to proceed with radiation and will be scheduled for CT simulation.  PLAN: Patient will be scheduled for CT  simulation approximately 4 weeks out from her second surgery with treatments to begin approximately 5 to 6 weeks postop.  This may be potentially delayed depending on the patient's healing from her second surgery.  Anticipate 6-1/2 weeks of radiation therapy.  Will use cardiac sparing techniques if necessary.   65 minutes of total time was spent for this patient encounter, including preparation, face-to-face counseling with the patient and coordination of care, physical exam, and documentation of the encounter.   ------------------------------------------------  Blair Promise, PhD, MD  This document serves as a record of services personally performed by Gery Pray, MD. It was created on his behalf by Wilburn Mylar, a trained medical scribe. The creation of this record is based on the scribe's personal observations and the provider's statements to them. This document has been checked and approved by the attending provider.

## 2019-12-07 NOTE — Progress Notes (Signed)
She had routine screening left mammogram on 06/14/2019 showing a developing asymmetry. She presented for left diagnostic mammogram and left breast ultrasound on 07/07/2019. Physical exam that day showed a palpable, firm, superficial mass in the left breast at 6 o'clock along the site of reduction mammoplasty scar (performed in 2015 with right mastectomy). Scans showed: breast density category B; 2.1 cm superficial mass involving the skin in the left breast at 6 o'clock; no enlarged adenopathy in the left axilla.   Accordingly on 07/12/2019 she proceeded to biopsy of the left breast area in question. The pathology from this procedure ([id number]) showed: invasive lobular carcinoma, grade 1, with perineural invasion present. Prognostic indicators significant for: estrogen receptor, 60% positive with moderate staining intensity and progesterone receptor, 0% negative. Proliferation marker Ki67 at <1%. HER2 equvocal by immunohistochemistry (2+), but negative by fluorescent in situ hybridization with a signals ratio 1.18 and number per cell 1.95.  She opted to proceed with left mastectomy on 09/27/2019 under Dr. Donne Hazel with immediate reconstruction under Dr. Marla Roe. Pathology from the procedure [id number]) revealed: invasive lobular carcinoma, grade 2, 6.2 cm, focally involving anterior margin and involving skin dermis without involvement of epidermis; lobular carcinoma in situ with pagetoid spread; small focus of low grade ductal carcinoma in situ, 0.2 cm.  Two lymph nodes were biopsied. One showed metastatic lobular carcinoma with focus measuring 2.1 cm with evidence of extranodal extension. The other lymph node also showed metastatic lobular carcinoma with no lymphoid tissue present.  Biopsy of the nipple was also taken at that time and showed no evidence of invasive carcinoma.  Mammaprint was performed on the final surgical sample. This returned showing low risk.  SAFETY ISSUES:  Prior  radiation? No  Pacemaker/ICD? No  Possible current pregnancy? No  Is the patient on methotrexate? No  Additional Complaints / other details:  Pt denies s/s lymphedema. Pt with fair ROM. Pt presents today for initial consult with Dr. Sondra Come for Radiation Oncology.  BP (!) 163/80 (BP Location: Right Arm, Patient Position: Sitting, Cuff Size: Large)   Pulse (!) 106   Temp 99.4 F (37.4 C)   Resp 20   Ht '5\' 4"'  (1.626 m)   Wt 171 lb (77.6 kg)   LMP 10/16/2013   SpO2 100%   BMI 29.35 kg/m   Wt Readings from Last 3 Encounters:  12/07/19 171 lb (77.6 kg)  12/05/19 172 lb (78 kg)  11/27/19 171 lb 12.8 oz (77.9 kg)   Loma Sousa, RN BSN

## 2019-12-12 ENCOUNTER — Ambulatory Visit (INDEPENDENT_AMBULATORY_CARE_PROVIDER_SITE_OTHER): Payer: BC Managed Care – PPO | Admitting: Surgical

## 2019-12-12 ENCOUNTER — Encounter: Payer: Self-pay | Admitting: Surgical

## 2019-12-12 ENCOUNTER — Other Ambulatory Visit: Payer: Self-pay

## 2019-12-12 VITALS — BP 143/84 | HR 71 | Temp 98.2°F | Ht 63.0 in | Wt 171.0 lb

## 2019-12-12 DIAGNOSIS — C50512 Malignant neoplasm of lower-outer quadrant of left female breast: Secondary | ICD-10-CM

## 2019-12-12 DIAGNOSIS — Z9012 Acquired absence of left breast and nipple: Secondary | ICD-10-CM

## 2019-12-12 DIAGNOSIS — Z170 Estrogen receptor positive status [ER+]: Secondary | ICD-10-CM

## 2019-12-12 DIAGNOSIS — C50912 Malignant neoplasm of unspecified site of left female breast: Secondary | ICD-10-CM

## 2019-12-12 NOTE — Progress Notes (Signed)
   Subjective:     Patient ID: Natasha Graham, female    DOB: Mar 14, 1961, 59 y.o.   MRN: [id number]  Chief Complaint  Patient presents with  . Follow-up    SX: 09/27/19 left nipple spanning masectomy reconstruction    HPI: Patient is a 59 year old female here for follow-up after left nipple sparing mastectomy and immediate reconstruction on 09/27/2019.  She underwent reexcision of left breast margins and axillary node dissection on 11/14/2019 by Dr. Donne Hazel.  I last saw the patient on 12/05/2019, it was noted that she had a wound that was approximately 0.3 x 2 cm. In the interim she has seen oncology, Dr. Sondra Come.  Per EMR review patient will be scheduled for CT simulation 4 weeks out from second surgery with treatments to begin approximately 5 to 6 weeks postop.  Total of 6-1/2 weeks of radiation therapy.  She is doing well today.  She does not report any increased pain, fever, chills, nausea, vomiting.  On exam her wound is slowly healing, it is not larger.  It is beginning to scab over with a honey colored crust.  Minimal tenderness to manipulation.   Review of Systems  Constitutional: Positive for activity change. Negative for chills and fever.  Musculoskeletal: Negative.   Skin: Positive for wound. Negative for rash.     Objective:   Vital Signs LMP 10/16/2013  Vital Signs and Nursing Note Reviewed Chaperone present Physical Exam  Constitutional: She is oriented to person, place, and time and well-developed, well-nourished, and in no distress.  Pulmonary/Chest: Effort normal.    Neurological: She is alert and oriented to person, place, and time. Gait normal.  Skin: Skin is warm and dry. She is not diaphoretic. No erythema.  Psychiatric: Mood and affect normal.     Assessment/Plan:     ICD-10-CM   1. S/P mastectomy, left  Z90.12   2. Malignant neoplasm of lower-outer quadrant of left breast of female, estrogen receptor positive ([place])  C50.512    Z17.0   3. Lobular  breast cancer, left ([place])  C50.912     Left breast wound is beginning to heal, I did apply some donated ACell to left breast wound to assist in formation of granular tissue and epithelialization.  I informed patient to apply K-Y jelly daily for the next 3 days and then she can shower.  After showering she can continue with daily dressing changes with K-Y jelly.  I would like to see her back in 1 week.  We did not place injectable saline in the Expander using a sterile technique: Left: 0 cc for a total of 90 / 455 cc  Due to patient having a wound and radiation beginning soon I thought it would be safer to avoid filling at this time to decrease risk of increased dehiscence.  Pending improvement at next week's appointment we can fill left breast expander.   We discussed that during radiation we will not fill and will wait for radiation to and prior to doing any additional fills.  Ideally we would fill her to her desired size prior to radiation therapy, but due to her having this wound the process has been delayed.  Call with any questions or concerns, will see patient in 1 week.   Natasha Rhine [place] Riding, PA-C 12/12/2019, 8:05 AM

## 2019-12-18 ENCOUNTER — Ambulatory Visit
Admission: RE | Admit: 2019-12-18 | Discharge: 2019-12-18 | Disposition: A | Discharge status: Home / Self Care | Payer: BC Managed Care – PPO | Source: Ambulatory Visit | Attending: Radiation Oncology | Admitting: Radiation Oncology

## 2019-12-18 ENCOUNTER — Other Ambulatory Visit: Payer: Self-pay

## 2019-12-18 DIAGNOSIS — C50512 Malignant neoplasm of lower-outer quadrant of left female breast: Secondary | ICD-10-CM | POA: Diagnosis not present

## 2019-12-18 DIAGNOSIS — Z510 Encounter for antineoplastic radiation therapy: Secondary | ICD-10-CM | POA: Insufficient documentation

## 2019-12-18 DIAGNOSIS — Z170 Estrogen receptor positive status [ER+]: Secondary | ICD-10-CM | POA: Diagnosis not present

## 2019-12-18 DIAGNOSIS — C50412 Malignant neoplasm of upper-outer quadrant of left female breast: Secondary | ICD-10-CM | POA: Diagnosis not present

## 2019-12-19 ENCOUNTER — Ambulatory Visit: Payer: BC Managed Care – PPO | Admitting: Surgical

## 2019-12-25 ENCOUNTER — Ambulatory Visit
Admission: RE | Admit: 2019-12-25 | Discharge: 2019-12-25 | Disposition: A | Discharge status: Home / Self Care | Payer: BC Managed Care – PPO | Source: Ambulatory Visit | Attending: Radiation Oncology | Admitting: Radiation Oncology

## 2019-12-25 DIAGNOSIS — C50412 Malignant neoplasm of upper-outer quadrant of left female breast: Secondary | ICD-10-CM | POA: Diagnosis not present

## 2019-12-25 DIAGNOSIS — C50512 Malignant neoplasm of lower-outer quadrant of left female breast: Secondary | ICD-10-CM

## 2019-12-25 DIAGNOSIS — Z170 Estrogen receptor positive status [ER+]: Secondary | ICD-10-CM | POA: Diagnosis not present

## 2019-12-25 DIAGNOSIS — Z510 Encounter for antineoplastic radiation therapy: Secondary | ICD-10-CM | POA: Diagnosis not present

## 2019-12-25 NOTE — Progress Notes (Signed)
  Radiation Oncology         [phone]) [phone] ________________________________  Name: Natasha Graham MRN: [id number]  Date: 12/25/2019  DOB: Jan 19, 1961  Simulation Verification Note    ICD-10-CM   1. Malignant neoplasm of lower-outer quadrant of left breast of female, estrogen receptor positive ([place])  C50.512    Z17.0     NARRATIVE: The patient was brought to the treatment unit and placed in the planned treatment position. The clinical setup was verified. Then port films were obtained and uploaded to the radiation oncology medical record software.  The treatment beams were carefully compared against the planned radiation fields. The position location and shape of the radiation fields was reviewed. They targeted volume of tissue appears to be appropriately covered by the radiation beams. Organs at risk appear to be excluded as planned.  Based on my personal review, I approved the simulation verification. The patient's treatment will proceed as planned.  -----------------------------------  Blair Promise, PhD, MD  This document serves as a record of services personally performed by Gery Pray, MD. It was created on his behalf by Clerance Lav, a trained medical scribe. The creation of this record is based on the scribe's personal observations and the provider's statements to them. This document has been checked and approved by the attending provider.

## 2019-12-26 ENCOUNTER — Ambulatory Visit
Admission: RE | Admit: 2019-12-26 | Discharge: 2019-12-26 | Disposition: A | Discharge status: Home / Self Care | Payer: BC Managed Care – PPO | Source: Ambulatory Visit | Attending: Radiation Oncology | Admitting: Radiation Oncology

## 2019-12-26 ENCOUNTER — Other Ambulatory Visit: Payer: Self-pay

## 2019-12-26 DIAGNOSIS — Z170 Estrogen receptor positive status [ER+]: Secondary | ICD-10-CM | POA: Diagnosis not present

## 2019-12-26 DIAGNOSIS — Z510 Encounter for antineoplastic radiation therapy: Secondary | ICD-10-CM | POA: Diagnosis not present

## 2019-12-26 DIAGNOSIS — C50412 Malignant neoplasm of upper-outer quadrant of left female breast: Secondary | ICD-10-CM | POA: Diagnosis not present

## 2019-12-26 DIAGNOSIS — C50512 Malignant neoplasm of lower-outer quadrant of left female breast: Secondary | ICD-10-CM

## 2019-12-26 MED ORDER — SONAFINE EX EMUL
1.0000 "application " | Freq: Two times a day (BID) | CUTANEOUS | Status: DC
  Administered 2019-12-26: 1 via TOPICAL

## 2019-12-26 MED ORDER — ALRA NON-METALLIC DEODORANT (RAD-ONC)
1.0000 "application " | Freq: Once | TOPICAL | Status: AC
  Administered 2019-12-26: 1 via TOPICAL

## 2019-12-27 ENCOUNTER — Other Ambulatory Visit: Payer: Self-pay

## 2019-12-27 ENCOUNTER — Ambulatory Visit
Admission: RE | Admit: 2019-12-27 | Discharge: 2019-12-27 | Disposition: A | Discharge status: Home / Self Care | Payer: BC Managed Care – PPO | Source: Ambulatory Visit | Attending: Radiation Oncology | Admitting: Radiation Oncology

## 2019-12-27 DIAGNOSIS — C50512 Malignant neoplasm of lower-outer quadrant of left female breast: Secondary | ICD-10-CM | POA: Diagnosis not present

## 2019-12-27 DIAGNOSIS — Z170 Estrogen receptor positive status [ER+]: Secondary | ICD-10-CM | POA: Diagnosis not present

## 2019-12-27 DIAGNOSIS — C50412 Malignant neoplasm of upper-outer quadrant of left female breast: Secondary | ICD-10-CM | POA: Diagnosis not present

## 2019-12-27 DIAGNOSIS — Z510 Encounter for antineoplastic radiation therapy: Secondary | ICD-10-CM | POA: Diagnosis not present

## 2019-12-28 ENCOUNTER — Ambulatory Visit
Admission: RE | Admit: 2019-12-28 | Discharge: 2019-12-28 | Disposition: A | Discharge status: Home / Self Care | Payer: BC Managed Care – PPO | Source: Ambulatory Visit | Attending: Radiation Oncology | Admitting: Radiation Oncology

## 2019-12-28 ENCOUNTER — Other Ambulatory Visit: Payer: Self-pay

## 2019-12-28 DIAGNOSIS — C50512 Malignant neoplasm of lower-outer quadrant of left female breast: Secondary | ICD-10-CM | POA: Diagnosis not present

## 2019-12-28 DIAGNOSIS — C50412 Malignant neoplasm of upper-outer quadrant of left female breast: Secondary | ICD-10-CM | POA: Diagnosis not present

## 2019-12-28 DIAGNOSIS — Z170 Estrogen receptor positive status [ER+]: Secondary | ICD-10-CM | POA: Diagnosis not present

## 2019-12-28 DIAGNOSIS — Z510 Encounter for antineoplastic radiation therapy: Secondary | ICD-10-CM | POA: Diagnosis not present

## 2019-12-29 ENCOUNTER — Other Ambulatory Visit: Payer: Self-pay

## 2019-12-29 ENCOUNTER — Ambulatory Visit
Admission: RE | Admit: 2019-12-29 | Discharge: 2019-12-29 | Disposition: A | Discharge status: Home / Self Care | Payer: BC Managed Care – PPO | Source: Ambulatory Visit | Attending: Radiation Oncology | Admitting: Radiation Oncology

## 2019-12-29 DIAGNOSIS — Z510 Encounter for antineoplastic radiation therapy: Secondary | ICD-10-CM | POA: Diagnosis not present

## 2019-12-29 DIAGNOSIS — Z170 Estrogen receptor positive status [ER+]: Secondary | ICD-10-CM | POA: Diagnosis not present

## 2019-12-29 DIAGNOSIS — C50512 Malignant neoplasm of lower-outer quadrant of left female breast: Secondary | ICD-10-CM | POA: Diagnosis not present

## 2019-12-29 DIAGNOSIS — C50412 Malignant neoplasm of upper-outer quadrant of left female breast: Secondary | ICD-10-CM | POA: Diagnosis not present

## 2020-01-01 ENCOUNTER — Ambulatory Visit
Admission: RE | Admit: 2020-01-01 | Discharge: 2020-01-01 | Disposition: A | Discharge status: Home / Self Care | Payer: BC Managed Care – PPO | Source: Ambulatory Visit | Attending: Radiation Oncology | Admitting: Radiation Oncology

## 2020-01-01 ENCOUNTER — Other Ambulatory Visit: Payer: Self-pay

## 2020-01-01 DIAGNOSIS — C50512 Malignant neoplasm of lower-outer quadrant of left female breast: Secondary | ICD-10-CM | POA: Diagnosis not present

## 2020-01-01 DIAGNOSIS — Z510 Encounter for antineoplastic radiation therapy: Secondary | ICD-10-CM | POA: Diagnosis not present

## 2020-01-01 DIAGNOSIS — Z170 Estrogen receptor positive status [ER+]: Secondary | ICD-10-CM | POA: Insufficient documentation

## 2020-01-01 DIAGNOSIS — C50412 Malignant neoplasm of upper-outer quadrant of left female breast: Secondary | ICD-10-CM | POA: Diagnosis present

## 2020-01-02 ENCOUNTER — Ambulatory Visit
Admission: RE | Admit: 2020-01-02 | Discharge: 2020-01-02 | Disposition: A | Discharge status: Home / Self Care | Payer: BC Managed Care – PPO | Source: Ambulatory Visit | Attending: Radiation Oncology | Admitting: Radiation Oncology

## 2020-01-02 ENCOUNTER — Other Ambulatory Visit: Payer: Self-pay

## 2020-01-02 DIAGNOSIS — C50512 Malignant neoplasm of lower-outer quadrant of left female breast: Secondary | ICD-10-CM | POA: Diagnosis not present

## 2020-01-02 DIAGNOSIS — Z170 Estrogen receptor positive status [ER+]: Secondary | ICD-10-CM | POA: Diagnosis not present

## 2020-01-02 DIAGNOSIS — Z510 Encounter for antineoplastic radiation therapy: Secondary | ICD-10-CM | POA: Diagnosis not present

## 2020-01-03 ENCOUNTER — Ambulatory Visit
Admission: RE | Admit: 2020-01-03 | Discharge: 2020-01-03 | Disposition: A | Discharge status: Home / Self Care | Payer: BC Managed Care – PPO | Source: Ambulatory Visit | Attending: Radiation Oncology | Admitting: Radiation Oncology

## 2020-01-03 ENCOUNTER — Other Ambulatory Visit: Payer: Self-pay

## 2020-01-03 DIAGNOSIS — C50512 Malignant neoplasm of lower-outer quadrant of left female breast: Secondary | ICD-10-CM | POA: Diagnosis not present

## 2020-01-03 DIAGNOSIS — Z170 Estrogen receptor positive status [ER+]: Secondary | ICD-10-CM | POA: Diagnosis not present

## 2020-01-03 DIAGNOSIS — Z510 Encounter for antineoplastic radiation therapy: Secondary | ICD-10-CM | POA: Diagnosis not present

## 2020-01-04 ENCOUNTER — Other Ambulatory Visit: Payer: Self-pay

## 2020-01-04 ENCOUNTER — Ambulatory Visit
Admission: RE | Admit: 2020-01-04 | Discharge: 2020-01-04 | Disposition: A | Discharge status: Home / Self Care | Payer: BC Managed Care – PPO | Source: Ambulatory Visit | Attending: Radiation Oncology | Admitting: Radiation Oncology

## 2020-01-04 DIAGNOSIS — Z170 Estrogen receptor positive status [ER+]: Secondary | ICD-10-CM | POA: Diagnosis not present

## 2020-01-04 DIAGNOSIS — C50512 Malignant neoplasm of lower-outer quadrant of left female breast: Secondary | ICD-10-CM | POA: Diagnosis not present

## 2020-01-04 DIAGNOSIS — Z510 Encounter for antineoplastic radiation therapy: Secondary | ICD-10-CM | POA: Diagnosis not present

## 2020-01-05 ENCOUNTER — Ambulatory Visit
Admission: RE | Admit: 2020-01-05 | Discharge: 2020-01-05 | Disposition: A | Discharge status: Home / Self Care | Payer: BC Managed Care – PPO | Source: Ambulatory Visit | Attending: Radiation Oncology | Admitting: Radiation Oncology

## 2020-01-05 ENCOUNTER — Other Ambulatory Visit: Payer: Self-pay

## 2020-01-05 DIAGNOSIS — Z510 Encounter for antineoplastic radiation therapy: Secondary | ICD-10-CM | POA: Diagnosis not present

## 2020-01-05 DIAGNOSIS — C50512 Malignant neoplasm of lower-outer quadrant of left female breast: Secondary | ICD-10-CM | POA: Diagnosis not present

## 2020-01-05 DIAGNOSIS — Z170 Estrogen receptor positive status [ER+]: Secondary | ICD-10-CM | POA: Diagnosis not present

## 2020-01-08 ENCOUNTER — Other Ambulatory Visit: Payer: Self-pay

## 2020-01-08 ENCOUNTER — Ambulatory Visit
Admission: RE | Admit: 2020-01-08 | Discharge: 2020-01-08 | Disposition: A | Discharge status: Home / Self Care | Payer: BC Managed Care – PPO | Source: Ambulatory Visit | Attending: Radiation Oncology | Admitting: Radiation Oncology

## 2020-01-08 DIAGNOSIS — C50512 Malignant neoplasm of lower-outer quadrant of left female breast: Secondary | ICD-10-CM | POA: Diagnosis not present

## 2020-01-08 DIAGNOSIS — Z170 Estrogen receptor positive status [ER+]: Secondary | ICD-10-CM | POA: Diagnosis not present

## 2020-01-08 DIAGNOSIS — Z510 Encounter for antineoplastic radiation therapy: Secondary | ICD-10-CM | POA: Diagnosis not present

## 2020-01-09 ENCOUNTER — Ambulatory Visit
Admission: RE | Admit: 2020-01-09 | Discharge: 2020-01-09 | Disposition: A | Discharge status: Home / Self Care | Payer: BC Managed Care – PPO | Source: Ambulatory Visit | Attending: Radiation Oncology | Admitting: Radiation Oncology

## 2020-01-09 ENCOUNTER — Other Ambulatory Visit: Payer: Self-pay

## 2020-01-09 DIAGNOSIS — Z170 Estrogen receptor positive status [ER+]: Secondary | ICD-10-CM | POA: Diagnosis not present

## 2020-01-09 DIAGNOSIS — C50512 Malignant neoplasm of lower-outer quadrant of left female breast: Secondary | ICD-10-CM | POA: Diagnosis not present

## 2020-01-09 DIAGNOSIS — Z510 Encounter for antineoplastic radiation therapy: Secondary | ICD-10-CM | POA: Diagnosis not present

## 2020-01-10 ENCOUNTER — Other Ambulatory Visit: Payer: Self-pay

## 2020-01-10 ENCOUNTER — Ambulatory Visit
Admission: RE | Admit: 2020-01-10 | Discharge: 2020-01-10 | Disposition: A | Discharge status: Home / Self Care | Payer: BC Managed Care – PPO | Source: Ambulatory Visit | Attending: Radiation Oncology | Admitting: Radiation Oncology

## 2020-01-10 DIAGNOSIS — C50512 Malignant neoplasm of lower-outer quadrant of left female breast: Secondary | ICD-10-CM | POA: Diagnosis not present

## 2020-01-10 DIAGNOSIS — Z170 Estrogen receptor positive status [ER+]: Secondary | ICD-10-CM | POA: Diagnosis not present

## 2020-01-10 DIAGNOSIS — Z510 Encounter for antineoplastic radiation therapy: Secondary | ICD-10-CM | POA: Diagnosis not present

## 2020-01-11 ENCOUNTER — Ambulatory Visit
Admission: RE | Admit: 2020-01-11 | Discharge: 2020-01-11 | Disposition: A | Discharge status: Home / Self Care | Payer: BC Managed Care – PPO | Source: Ambulatory Visit | Attending: Radiation Oncology | Admitting: Radiation Oncology

## 2020-01-11 DIAGNOSIS — Z170 Estrogen receptor positive status [ER+]: Secondary | ICD-10-CM | POA: Diagnosis not present

## 2020-01-11 DIAGNOSIS — C50512 Malignant neoplasm of lower-outer quadrant of left female breast: Secondary | ICD-10-CM | POA: Diagnosis not present

## 2020-01-11 DIAGNOSIS — Z510 Encounter for antineoplastic radiation therapy: Secondary | ICD-10-CM | POA: Diagnosis not present

## 2020-01-12 ENCOUNTER — Ambulatory Visit
Admission: RE | Admit: 2020-01-12 | Discharge: 2020-01-12 | Disposition: A | Discharge status: Home / Self Care | Payer: BC Managed Care – PPO | Source: Ambulatory Visit | Attending: Radiation Oncology | Admitting: Radiation Oncology

## 2020-01-12 DIAGNOSIS — Z170 Estrogen receptor positive status [ER+]: Secondary | ICD-10-CM | POA: Diagnosis not present

## 2020-01-12 DIAGNOSIS — C50512 Malignant neoplasm of lower-outer quadrant of left female breast: Secondary | ICD-10-CM

## 2020-01-12 DIAGNOSIS — Z510 Encounter for antineoplastic radiation therapy: Secondary | ICD-10-CM | POA: Diagnosis not present

## 2020-01-12 MED ORDER — SONAFINE EX EMUL
1.0000 "application " | Freq: Two times a day (BID) | CUTANEOUS | Status: DC
  Administered 2020-01-12: 1 via TOPICAL

## 2020-01-15 ENCOUNTER — Ambulatory Visit
Admission: RE | Admit: 2020-01-15 | Discharge: 2020-01-15 | Disposition: A | Discharge status: Home / Self Care | Payer: BC Managed Care – PPO | Source: Ambulatory Visit | Attending: Radiation Oncology | Admitting: Radiation Oncology

## 2020-01-15 ENCOUNTER — Other Ambulatory Visit: Payer: Self-pay

## 2020-01-15 DIAGNOSIS — C50512 Malignant neoplasm of lower-outer quadrant of left female breast: Secondary | ICD-10-CM | POA: Diagnosis not present

## 2020-01-15 DIAGNOSIS — Z510 Encounter for antineoplastic radiation therapy: Secondary | ICD-10-CM | POA: Diagnosis not present

## 2020-01-15 DIAGNOSIS — Z170 Estrogen receptor positive status [ER+]: Secondary | ICD-10-CM | POA: Diagnosis not present

## 2020-01-16 ENCOUNTER — Ambulatory Visit
Admission: RE | Admit: 2020-01-16 | Discharge: 2020-01-16 | Disposition: A | Discharge status: Home / Self Care | Payer: BC Managed Care – PPO | Source: Ambulatory Visit | Attending: Radiation Oncology | Admitting: Radiation Oncology

## 2020-01-16 ENCOUNTER — Other Ambulatory Visit: Payer: Self-pay

## 2020-01-16 DIAGNOSIS — Z510 Encounter for antineoplastic radiation therapy: Secondary | ICD-10-CM | POA: Diagnosis not present

## 2020-01-16 DIAGNOSIS — Z170 Estrogen receptor positive status [ER+]: Secondary | ICD-10-CM | POA: Diagnosis not present

## 2020-01-16 DIAGNOSIS — C50512 Malignant neoplasm of lower-outer quadrant of left female breast: Secondary | ICD-10-CM | POA: Diagnosis not present

## 2020-01-17 ENCOUNTER — Ambulatory Visit
Admission: RE | Admit: 2020-01-17 | Discharge: 2020-01-17 | Disposition: A | Discharge status: Home / Self Care | Payer: BC Managed Care – PPO | Source: Ambulatory Visit | Attending: Radiation Oncology | Admitting: Radiation Oncology

## 2020-01-17 ENCOUNTER — Other Ambulatory Visit: Payer: Self-pay

## 2020-01-17 DIAGNOSIS — Z170 Estrogen receptor positive status [ER+]: Secondary | ICD-10-CM | POA: Diagnosis not present

## 2020-01-17 DIAGNOSIS — C50512 Malignant neoplasm of lower-outer quadrant of left female breast: Secondary | ICD-10-CM | POA: Diagnosis not present

## 2020-01-17 DIAGNOSIS — Z510 Encounter for antineoplastic radiation therapy: Secondary | ICD-10-CM | POA: Diagnosis not present

## 2020-01-18 ENCOUNTER — Ambulatory Visit
Admission: RE | Admit: 2020-01-18 | Discharge: 2020-01-18 | Disposition: A | Discharge status: Home / Self Care | Payer: BC Managed Care – PPO | Source: Ambulatory Visit | Attending: Radiation Oncology | Admitting: Radiation Oncology

## 2020-01-18 ENCOUNTER — Ambulatory Visit: Payer: BC Managed Care – PPO | Attending: General Surgery

## 2020-01-18 ENCOUNTER — Other Ambulatory Visit: Payer: Self-pay

## 2020-01-18 DIAGNOSIS — M25512 Pain in left shoulder: Secondary | ICD-10-CM | POA: Insufficient documentation

## 2020-01-18 DIAGNOSIS — M25612 Stiffness of left shoulder, not elsewhere classified: Secondary | ICD-10-CM | POA: Insufficient documentation

## 2020-01-18 DIAGNOSIS — I890 Lymphedema, not elsewhere classified: Secondary | ICD-10-CM | POA: Insufficient documentation

## 2020-01-18 DIAGNOSIS — Z170 Estrogen receptor positive status [ER+]: Secondary | ICD-10-CM | POA: Insufficient documentation

## 2020-01-18 DIAGNOSIS — C50512 Malignant neoplasm of lower-outer quadrant of left female breast: Secondary | ICD-10-CM | POA: Insufficient documentation

## 2020-01-18 DIAGNOSIS — Z510 Encounter for antineoplastic radiation therapy: Secondary | ICD-10-CM | POA: Diagnosis not present

## 2020-01-18 NOTE — Patient Instructions (Signed)
Access Code: [email or web address] URL: [email or web address] Date: 01/18/2020 Prepared by: Tomma Rakers  Exercises Shoulder Flexion Wall Slide with Towel - 3 x daily - 7 x weekly - 1 sets - 10 reps - 5 seconds hold Standing Shoulder Abduction Slides at Wall - 3 x daily - 7 x weekly - 1 sets - 10 reps - 5 seconds hold

## 2020-01-18 NOTE — Therapy (Signed)
[place], [place], [address] Phone: [phone]   Fax:  [phone]  Physical Therapy Evaluation  Patient Details  Name: Natasha Graham MRN: [id number] Date of Birth: 1961-07-20 Referring Provider (PT): Rolm Bookbinder MD   Encounter Date: 01/18/2020  PT End of Session - 01/18/20 1504    Visit Number  1    Number of Visits  5    Date for PT Re-Evaluation  02/29/20    PT Start Time  1500    PT Stop Time  1545    PT Time Calculation (min)  45 min    Activity Tolerance  Patient tolerated treatment well    Behavior During Therapy  [facility] for tasks assessed/performed       Past Medical History:  Diagnosis Date  . Cancer [facility])    breast cancer  . Hypertension   . PONV (postoperative nausea and vomiting)   . Scoliosis   . Wears glasses     Past Surgical History:  Procedure Laterality Date  . AUGMENTATION MAMMAPLASTY Right    2015 post mastectomy  . AXILLARY LYMPH NODE DISSECTION Left 11/13/2019   Procedure: LEFT AXILLARY LYMPH NODE DISSECTION;  Surgeon: Rolm Bookbinder, MD;  Location: [place];  Service: General;  Laterality: Left;  . BREAST RECONSTRUCTION WITH PLACEMENT OF TISSUE EXPANDER AND FLEX HD (ACELLULAR HYDRATED DERMIS) Right 05/16/2014   Procedure: IMMEDIATE RIGHT BREAST RECONSTRUCTION WITH PLACEMENT OF TISSUE EXPANDER AND FLEX HD (ACELLULAR HYDRATED DERMIS);  Surgeon: Theodoro Kos, DO;  Location: [place];  Service: Plastics;  Laterality: Right;  . BREAST RECONSTRUCTION WITH PLACEMENT OF TISSUE EXPANDER AND FLEX HD (ACELLULAR HYDRATED DERMIS) Left 09/27/2019   Procedure: LEFT BREAST RECONSTRUCTION WITH PLACEMENT OF TISSUE EXPANDER AND FLEX HD (ACELLULAR HYDRATED DERMIS);  Surgeon: [facility] Going, DO;  Location: [place];  Service: Plastics;  Laterality: Left;  . BREAST SURGERY     right breast excisional biopsy  . DILATION AND CURETTAGE OF UTERUS     . HYSTEROSCOPY WITH D & C N/A 09/20/2015   Procedure: DILATATION AND CURETTAGE /HYSTEROSCOPY;  Surgeon: Brien Few, MD;  Location: [facility];  Service: Gynecology;  Laterality: N/A;  . LIPOSUCTION Bilateral 08/08/2014   Procedure: LIPO SUCTION ;  Surgeon: Theodoro Kos, DO;  Location: [place];  Service: Plastics;  Laterality: Bilateral;  . MASTECTOMY Right 05/16/2014   placement of acellular dermal matrix & tissue expanders   . MASTOPEXY Left 08/08/2014   Procedure:  MASTOPEXY FOR SYMMETRY;  Surgeon: Theodoro Kos, DO;  Location: [place];  Service: Plastics;  Laterality: Left;  . NIPPLE SPARING MASTECTOMY WITH SENTINEL LYMPH NODE BIOPSY Left 09/27/2019   Procedure: LEFT NIPPLE SPARING MASTECTOMY WITH LEFT AXILLARY SENTINEL LYMPH NODE BIOPSY;  Surgeon: Rolm Bookbinder, MD;  Location: [place];  Service: General;  Laterality: Left;  . REDUCTION MAMMAPLASTY Left    2015  . REMOVAL OF TISSUE EXPANDER AND PLACEMENT OF IMPLANT Right 08/08/2014   Procedure: REMOVAL OF RIGHT  TISSUE EXPANDERS WITH PLACEMENT OF RIGHT BREAST IMPLANTS WITH LIPO SUCTION ;  Surgeon: Theodoro Kos, DO;  Location: [place];  Service: Plastics;  Laterality: Right;  . SCAR REVISION Left 11/13/2019   Procedure: EXCISION OF LEFT MASTECTOMY SKIN;  Surgeon: Rolm Bookbinder, MD;  Location: [facility];  Service: General;  Laterality: Left;  . scoliosis  1972   harrington rods-age 13  . TONSILLECTOMY  There were no vitals filed for this visit.   Subjective Assessment - 01/18/20 1404    Subjective  Pt reports that she had a R masectomy at the end of January and everything was fine but then after they went back in for surgery and the ALND she started to notice limitations. Currently pt states that she has improved significantly with mobility since her surgery; pt reports she is taking ibuprofen daily to help with pain and radiation. She is  currently going through radiation and has some skin irritation mostly on the L anterior chest wall.    Pertinent History  Current radiation treatment, invasive lobular carcinoma, grade 1, with perineural invasion present. Prognostic indicators significant for: estrogen receptor, 60% positive with moderate staining intensity and progesterone receptor, 0% negative. Proliferation marker Ki67 at <1%. L masectomy (09/27/2019 and ALND 11/13/2019), R masectomy 2015.    Patient Stated Goals  I want to manage my L arm for lymphedema becuase I was told about it when I had surgery on my R arm. Also make sure that I have all of my arm mobility.    Currently in Pain?  Yes    Pain Score  1     Pain Location  Axilla    Pain Orientation  Left    Pain Descriptors / Indicators  Aching    Pain Type  Surgical pain    Pain Onset  More than a month ago    Pain Frequency  Intermittent         [facility] PT Assessment - 01/18/20 0001      Assessment   Medical Diagnosis  L breast cancer     Referring Provider (PT)  Rolm Bookbinder MD    Onset Date/Surgical Date  09/27/19    Hand Dominance  Right    Prior Therapy  Yes in 2015 for R masectomy       Precautions   Precautions  Other (comment)    Precaution Comments  Breast cancer radiation.       Balance Screen   Has the patient fallen in the past 6 months  No    Has the patient had a decrease in activity level because of a fear of falling?   Yes    Is the patient reluctant to leave their home because of a fear of falling?   No      Home Environment   Living Environment  Private residence    Living Arrangements  Spouse/significant other;Children    Type of Home  House    Additional Comments  Pt has steps in her house but does not have trouble ascending/descending.       Prior Function   Level of Independence  Independent    Vocation  Other (comment)   cares for 63 month old and 38  yeras old.    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     Leisure   walking, piano      Cognition   Overall Cognitive Status  Within Functional Limits for tasks assessed      Observation/Other Assessments   Observations  swelling noted in the LUE      Sensation   Light Touch  Appears Intact      Coordination   Gross Motor Movements are Fluid and Coordinated  Yes      Posture/Postural Control   Posture/Postural Control  Postural limitations    Postural Limitations  Rounded Shoulders      ROM / Strength  AROM / PROM / Strength  AROM      AROM   AROM Assessment Site  Shoulder    Right/Left Shoulder  Right;Left    Right Shoulder Flexion  155 Degrees    Right Shoulder ABduction  159 Degrees    Right Shoulder Internal Rotation  90 Degrees    Right Shoulder External Rotation  76 Degrees    Left Shoulder Flexion  120 Degrees    Left Shoulder ABduction  110 Degrees    Left Shoulder Internal Rotation  75 Degrees    Left Shoulder External Rotation  70 Degrees        LYMPHEDEMA/ONCOLOGY QUESTIONNAIRE - 01/18/20 1425      Type   Cancer Type  L breast cancer       Surgeries   Mastectomy Date  09/27/19    Axillary Lymph Node Dissection Date  11/13/19    Number Lymph Nodes Removed  7      Treatment   Active Chemotherapy Treatment  No    Past Chemotherapy Treatment  No    Active Radiation Treatment  Yes    Date  01/18/20    Body Site  L breast and axilla    Past Radiation Treatment  No    Current Hormone Treatment  No    Past Hormone Therapy  No      What other symptoms do you have   Are you Having Heaviness or Tightness  Yes    Are you having Pain  Yes    Are you having pitting edema  No    Is it Hard or Difficult finding clothes that fit  No    Do you have infections  No    Is there Decreased scar mobility  No      Lymphedema Assessments   Lymphedema Assessments  Upper extremities      Right Upper Extremity Lymphedema   15 cm Proximal to Olecranon Process  33.5 cm    10 cm Proximal to Olecranon Process  31.8 cm    Olecranon  Process  26.1 cm    15 cm Proximal to Ulnar Styloid Process  25.6 cm    10 cm Proximal to Ulnar Styloid Process  22.8 cm    Just Proximal to Ulnar Styloid Process  16.2 cm    Across Hand at [employer]  18.8 cm    At [place] of 2nd Digit  6.4 cm      Left Upper Extremity Lymphedema   15 cm Proximal to Olecranon Process  35.3 cm    10 cm Proximal to Olecranon Process  32.5 cm    Olecranon Process  26.4 cm    15 cm Proximal to Ulnar Styloid Process  25.9 cm    10 cm Proximal to Ulnar Styloid Process  23.5 cm    Just Proximal to Ulnar Styloid Process  17.4 cm    Across Hand at [employer]  18.4 cm    At [place] of 2nd Digit  6.2 cm            Quick Dash - 01/18/20 0001    Open a tight or new jar  Mild difficulty    Do heavy household chores (wash walls, wash floors)  Mild difficulty    Carry a shopping bag or briefcase  No difficulty    Wash your back  No difficulty    Use a knife to cut food  No difficulty    Recreational activities  in which you take some force or impact through your arm, shoulder, or hand (golf, hammering, tennis)  Unable    During the past week, to what extent has your arm, shoulder or hand problem interfered with your normal social activities with family, friends, neighbors, or groups?  Not at all    During the past week, to what extent has your arm, shoulder or hand problem limited your work or other regular daily activities  Not at all    Arm, shoulder, or hand pain.  Mild    Tingling (pins and needles) in your arm, shoulder, or hand  None    Difficulty Sleeping  No difficulty    DASH Score  15.91 %        Objective measurements completed on examination: See above findings.      [facility] Adult PT Treatment/Exercise - 01/18/20 0001      Exercises   Exercises  Shoulder      Shoulder Exercises: Standing   Flexion  AAROM;10 reps;Left    Flexion Limitations  with demonstration and return demonstration with VC to avoid significant stretching.      ABduction  AAROM;Left;10 reps    ABduction Limitations  with demonstration and return demonstration with VC to avoid significant stretch.              PT Education - 01/18/20 1442    Education Details  Access Code: [email or web address], Pt was taught shoulder wall flexion and abduction slide; included education on light stretching and avoiding pain to decrease risk for further inflammation. Discussed wearing a sleeve 12 hours/day for 4 weeks due to pt has mild edema in her LUE in the antebrachium and brachium. She was provided with script and address for [facility]. Pt was educated on lymphedema and fatigue of the lymphatic system after surgery/radiation. Discussed post op edema vs. lymphedema. Pt was educated on cording and how cording is treated.    Person(s) Educated  Patient    Methods  Explanation;Demonstration;Verbal cues;Handout    Comprehension  Verbalized understanding       PT Short Term Goals - 01/18/20 1759      PT SHORT TERM GOAL #1   Title  Pt will be independent with her HEP    Baseline  pt provided with HEP today    Time  4    Period  Weeks    Status  New    Target Date  02/29/20        PT Long Term Goals - 01/18/20 1759      PT LONG TERM GOAL #1   Title  Pt will be independent with MLD and be fitted for appropriate compression garment to wear on a daily basis to manage edema and improve autonomy of care.    Baseline  Pt currently does not have a compression sleeve or know how to perform MLD.    Time  4    Period  Weeks    Status  New    Target Date  02/29/20      PT LONG TERM GOAL #2   Title  Pt will improve L shoulder ROM to 150 degrees flexion/abduction in order to improve functional ROM.    Baseline  L shoulder flexoin 120, abduction 110    Time  4    Period  Weeks    Status  New    Target Date  02/29/20      PT LONG TERM GOAL #3   Title  Pt  will decrease L proximal brachium and distal antebrachium circumferential measurements by 1 cm in order to  demonstrate decreased edema in the LUE.    Baseline  see measurements.    Time  4    Period  Weeks    Status  New    Target Date  02/29/20             Plan - 01/18/20 1504    Clinical Impression Statement  Pt presents to physical therapy with increased circumferential measurements in her L proximal brachium and distal antebrachium. She has decreased ROM in her L shoulder compared to her R and is reporting low level of pain. She currently does not have any compression. Pt was provided with script and where to purchase and be fitted for a compression garment. Cording is noted in the L axilla to the medial mid brachium. Pt will benefit from skilled physical therapy services modified to 1x/week for 4 week for pt preference to address the above limitations.    Personal Factors and Comorbidities  Comorbidity 2    Comorbidities  bil masectomy, ALND of the L axilla, 7 lymph node removal, radiation on the L    Stability/Clinical Decision Making  Stable/Uncomplicated    Clinical Decision Making  Low    Rehab Potential  Good    PT Frequency  1x / week   due to pt time constraints and preference.   PT Duration  4 weeks    PT Treatment/Interventions  Therapeutic exercise;Therapeutic activities;Neuromuscular re-education;Manual techniques;Patient/family education    PT Next Visit Plan  Teach Patient MLD of the LUE, perform MLD, stretching/myofascial release as needed.    PT Home Exercise Plan  Access Code: [email or web address]    Recommended Other Services  Compression sleeve from [facility].    Consulted and Agree with Plan of Care  Patient       Patient will benefit from skilled therapeutic intervention in order to improve the following deficits and impairments:  Increased edema, Pain, Decreased range of motion  Visit Diagnosis: Malignant neoplasm of lower-outer quadrant of left breast of female, estrogen receptor positive ([facility])  Stiffness of left shoulder, not elsewhere classified  Acute pain  of left shoulder  Lymphedema, not elsewhere classified     Problem List Patient Active Problem List   Diagnosis Date Noted  . Breast cancer, left breast ([place]) 11/13/2019  . Malignant neoplasm of lower-outer quadrant of left breast of female, estrogen receptor positive ([place]) 10/18/2019  . S/P mastectomy, left 10/06/2019  . Acquired absence of right breast 08/21/2019  . Status post right breast reconstruction 08/21/2019  . Breast neoplasm, Tis (DCIS), right 05/16/2014  . Atypical ductal hyperplasia of right breast 01/02/2014    Ander Purpura, PT 01/18/2020, 6:02 PM  [place], [place], [address] Phone: [phone]   Fax:  [phone]  Name: Natasha Graham MRN: [id number] Date of Birth: 1960/10/27

## 2020-01-19 ENCOUNTER — Other Ambulatory Visit: Payer: Self-pay

## 2020-01-19 ENCOUNTER — Ambulatory Visit
Admission: RE | Admit: 2020-01-19 | Discharge: 2020-01-19 | Disposition: A | Discharge status: Home / Self Care | Payer: BC Managed Care – PPO | Source: Ambulatory Visit | Attending: Radiation Oncology | Admitting: Radiation Oncology

## 2020-01-19 DIAGNOSIS — C50512 Malignant neoplasm of lower-outer quadrant of left female breast: Secondary | ICD-10-CM | POA: Diagnosis not present

## 2020-01-19 DIAGNOSIS — Z510 Encounter for antineoplastic radiation therapy: Secondary | ICD-10-CM | POA: Diagnosis not present

## 2020-01-19 DIAGNOSIS — Z170 Estrogen receptor positive status [ER+]: Secondary | ICD-10-CM | POA: Diagnosis not present

## 2020-01-22 ENCOUNTER — Ambulatory Visit
Admission: RE | Admit: 2020-01-22 | Discharge: 2020-01-22 | Disposition: A | Discharge status: Home / Self Care | Payer: BC Managed Care – PPO | Source: Ambulatory Visit | Attending: Radiation Oncology | Admitting: Radiation Oncology

## 2020-01-22 ENCOUNTER — Other Ambulatory Visit: Payer: Self-pay

## 2020-01-22 DIAGNOSIS — Z170 Estrogen receptor positive status [ER+]: Secondary | ICD-10-CM | POA: Diagnosis not present

## 2020-01-22 DIAGNOSIS — Z510 Encounter for antineoplastic radiation therapy: Secondary | ICD-10-CM | POA: Diagnosis not present

## 2020-01-22 DIAGNOSIS — C50512 Malignant neoplasm of lower-outer quadrant of left female breast: Secondary | ICD-10-CM | POA: Diagnosis not present

## 2020-01-22 NOTE — Progress Notes (Signed)
  Radiation Oncology         [phone] ________________________________  Name: Natasha Graham MRN: [id number]  Date: 01/23/2020  DOB: [birth year]  Simulation note The patient was brought to the treatment room for simulation for the patient's upcoming electron treatment. The patient was setup in the treatment position and the target region was delineated. The patient will receive treatment to the left chest wall using an en face electron field. 1 customized block/complex treatment device has been constructed for this purpose, and this will be used on a daily basis during the patient's treatment. After appropriate set up was confirmed, skin markings were placed to allow accurate targeting of the treatment area during the patient's course of therapy.  -----------------------------------  Blair Promise, PhD, MD  This document serves as a record of services personally performed by Gery Pray, MD. It was created on his behalf by Clerance Lav, a trained medical scribe. The creation of this record is based on the scribe's personal observations and the provider's statements to them. This document has been checked and approved by the attending provider.

## 2020-01-23 ENCOUNTER — Ambulatory Visit
Admission: RE | Admit: 2020-01-23 | Discharge: 2020-01-23 | Disposition: A | Discharge status: Home / Self Care | Payer: BC Managed Care – PPO | Source: Ambulatory Visit | Attending: Radiation Oncology | Admitting: Radiation Oncology

## 2020-01-23 ENCOUNTER — Other Ambulatory Visit: Payer: Self-pay

## 2020-01-23 DIAGNOSIS — Z510 Encounter for antineoplastic radiation therapy: Secondary | ICD-10-CM | POA: Diagnosis not present

## 2020-01-23 DIAGNOSIS — C50512 Malignant neoplasm of lower-outer quadrant of left female breast: Secondary | ICD-10-CM

## 2020-01-23 DIAGNOSIS — Z170 Estrogen receptor positive status [ER+]: Secondary | ICD-10-CM

## 2020-01-23 MED ORDER — SONAFINE EX EMUL
1.0000 "application " | Freq: Two times a day (BID) | CUTANEOUS | Status: DC
  Administered 2020-01-23: 1 via TOPICAL

## 2020-01-24 ENCOUNTER — Ambulatory Visit
Admission: RE | Admit: 2020-01-24 | Discharge: 2020-01-24 | Disposition: A | Discharge status: Home / Self Care | Payer: BC Managed Care – PPO | Source: Ambulatory Visit | Attending: Radiation Oncology | Admitting: Radiation Oncology

## 2020-01-24 ENCOUNTER — Other Ambulatory Visit: Payer: Self-pay

## 2020-01-24 DIAGNOSIS — C50512 Malignant neoplasm of lower-outer quadrant of left female breast: Secondary | ICD-10-CM | POA: Diagnosis not present

## 2020-01-24 DIAGNOSIS — Z510 Encounter for antineoplastic radiation therapy: Secondary | ICD-10-CM | POA: Diagnosis not present

## 2020-01-24 DIAGNOSIS — Z170 Estrogen receptor positive status [ER+]: Secondary | ICD-10-CM | POA: Diagnosis not present

## 2020-01-25 ENCOUNTER — Other Ambulatory Visit: Payer: Self-pay

## 2020-01-25 ENCOUNTER — Ambulatory Visit
Admission: RE | Admit: 2020-01-25 | Discharge: 2020-01-25 | Disposition: A | Discharge status: Home / Self Care | Payer: BC Managed Care – PPO | Source: Ambulatory Visit | Attending: Radiation Oncology | Admitting: Radiation Oncology

## 2020-01-25 DIAGNOSIS — Z510 Encounter for antineoplastic radiation therapy: Secondary | ICD-10-CM | POA: Diagnosis not present

## 2020-01-25 DIAGNOSIS — Z170 Estrogen receptor positive status [ER+]: Secondary | ICD-10-CM | POA: Diagnosis not present

## 2020-01-25 DIAGNOSIS — C50512 Malignant neoplasm of lower-outer quadrant of left female breast: Secondary | ICD-10-CM | POA: Diagnosis not present

## 2020-01-26 ENCOUNTER — Ambulatory Visit
Admission: RE | Admit: 2020-01-26 | Discharge: 2020-01-26 | Disposition: A | Discharge status: Home / Self Care | Payer: BC Managed Care – PPO | Source: Ambulatory Visit | Attending: Radiation Oncology | Admitting: Radiation Oncology

## 2020-01-26 DIAGNOSIS — Z510 Encounter for antineoplastic radiation therapy: Secondary | ICD-10-CM | POA: Diagnosis not present

## 2020-01-26 DIAGNOSIS — Z170 Estrogen receptor positive status [ER+]: Secondary | ICD-10-CM | POA: Diagnosis not present

## 2020-01-26 DIAGNOSIS — C50512 Malignant neoplasm of lower-outer quadrant of left female breast: Secondary | ICD-10-CM | POA: Diagnosis not present

## 2020-01-30 ENCOUNTER — Other Ambulatory Visit: Payer: Self-pay

## 2020-01-30 ENCOUNTER — Ambulatory Visit
Admission: RE | Admit: 2020-01-30 | Discharge: 2020-01-30 | Disposition: A | Discharge status: Home / Self Care | Payer: BC Managed Care – PPO | Source: Ambulatory Visit | Attending: Radiation Oncology | Admitting: Radiation Oncology

## 2020-01-30 DIAGNOSIS — Z170 Estrogen receptor positive status [ER+]: Secondary | ICD-10-CM

## 2020-01-30 DIAGNOSIS — C50512 Malignant neoplasm of lower-outer quadrant of left female breast: Secondary | ICD-10-CM

## 2020-01-30 DIAGNOSIS — Z510 Encounter for antineoplastic radiation therapy: Secondary | ICD-10-CM | POA: Diagnosis not present

## 2020-01-30 DIAGNOSIS — C50412 Malignant neoplasm of upper-outer quadrant of left female breast: Secondary | ICD-10-CM | POA: Diagnosis present

## 2020-01-30 NOTE — Progress Notes (Signed)
  Radiation Oncology         [phone]) [phone] ________________________________  Name: Natasha Graham MRN: [id number]  Date: 01/30/2020  DOB: [birth year]  Simulation verification   The patient was brought to the treatment machine and placed in the plan treatment position.  Clinical set up was verified to ensure that the target region is appropriately covered for the patient's upcoming electron boost treatment.  The targeted volume of tissue is appropriately covered by the radiation field.  Based on my personal review, I approve the simulation verification.  The patient's treatment will proceed as planned.  -----------------------------------  Blair Promise, PhD, MD  This document serves as a record of services personally performed by Gery Pray, MD. It was created on his behalf by Clerance Lav, a trained medical scribe. The creation of this record is based on the scribe's personal observations and the provider's statements to them. This document has been checked and approved by the attending provider.

## 2020-01-31 ENCOUNTER — Other Ambulatory Visit: Payer: Self-pay

## 2020-01-31 ENCOUNTER — Ambulatory Visit
Admission: RE | Admit: 2020-01-31 | Discharge: 2020-01-31 | Disposition: A | Discharge status: Home / Self Care | Payer: BC Managed Care – PPO | Source: Ambulatory Visit | Attending: Radiation Oncology | Admitting: Radiation Oncology

## 2020-01-31 DIAGNOSIS — C50512 Malignant neoplasm of lower-outer quadrant of left female breast: Secondary | ICD-10-CM | POA: Diagnosis not present

## 2020-01-31 DIAGNOSIS — Z510 Encounter for antineoplastic radiation therapy: Secondary | ICD-10-CM | POA: Diagnosis not present

## 2020-01-31 DIAGNOSIS — Z170 Estrogen receptor positive status [ER+]: Secondary | ICD-10-CM | POA: Diagnosis not present

## 2020-02-01 ENCOUNTER — Encounter: Payer: Self-pay | Admitting: Physical Therapy

## 2020-02-01 ENCOUNTER — Ambulatory Visit
Admission: RE | Admit: 2020-02-01 | Discharge: 2020-02-01 | Disposition: A | Discharge status: Home / Self Care | Payer: BC Managed Care – PPO | Source: Ambulatory Visit | Attending: Radiation Oncology | Admitting: Radiation Oncology

## 2020-02-01 ENCOUNTER — Ambulatory Visit: Payer: BC Managed Care – PPO | Attending: General Surgery | Admitting: Physical Therapy

## 2020-02-01 ENCOUNTER — Other Ambulatory Visit: Payer: Self-pay

## 2020-02-01 DIAGNOSIS — M25612 Stiffness of left shoulder, not elsewhere classified: Secondary | ICD-10-CM | POA: Insufficient documentation

## 2020-02-01 DIAGNOSIS — C50512 Malignant neoplasm of lower-outer quadrant of left female breast: Secondary | ICD-10-CM | POA: Insufficient documentation

## 2020-02-01 DIAGNOSIS — Z510 Encounter for antineoplastic radiation therapy: Secondary | ICD-10-CM | POA: Diagnosis not present

## 2020-02-01 DIAGNOSIS — C50919 Malignant neoplasm of unspecified site of unspecified female breast: Secondary | ICD-10-CM | POA: Diagnosis not present

## 2020-02-01 DIAGNOSIS — M25512 Pain in left shoulder: Secondary | ICD-10-CM | POA: Diagnosis not present

## 2020-02-01 DIAGNOSIS — I890 Lymphedema, not elsewhere classified: Secondary | ICD-10-CM | POA: Diagnosis not present

## 2020-02-01 DIAGNOSIS — Z170 Estrogen receptor positive status [ER+]: Secondary | ICD-10-CM | POA: Insufficient documentation

## 2020-02-01 NOTE — Patient Instructions (Addendum)
Deep Effective Breath   Standing, sitting, or laying down, place both hands on the belly. Take a deep breath IN, expanding the belly; then breath OUT, contracting the belly. Repeat __5__ times. Do __2-3__ sessions per day and before your self massage.  http://gt2.exer.us/866    Copyright  VHI. All rights reserved.  Axilla to Inguinal Nodes - Sweep   On involved side, make 5 circles at groin at panty line, then pump _5__ times from armpit along side of trunk to outer hip, making your other pathway. Do __1_ time per day.  Copyright  VHI. All rights reserved.  Arm Posterior: Elbow to Shoulder - Sweep   Pump _5__ times from back of elbow to top of shoulder. Then inner to outer upper arm _5_ times, then outer arm again _5_ times. Then back to the pathways _2-3_ times. Do _1__ time per day.  Copyright  VHI. All rights reserved.  ARM: [place] Wrist to Elbow - Sweep   Pump or stationary circles _5__ times from wrist to elbow making sure to do both sides of the forearm. Then retrace your steps to the outer arm, and the pathways _2-3_ times each. Do _1__ time per day.  Copyright  VHI. All rights reserved.  ARM: Dorsum of Hand to Shoulder - Sweep   Pump or stationary circles _5__ times on back of hand including knuckle spaces and individual fingers if needed working up towards the wrist, then retrace all your steps working back up the forearm, doing both sides; upper outer arm and back to your pathways _2-3_ times each. Then do 5 circles again at uninvolved armpit and involved groin where you started! Good job!! Do __1_ time per day.  Copyright  VHI. All rights reserved.

## 2020-02-01 NOTE — Therapy (Signed)
[place], [place], [address] Phone: [phone]   Fax:  [phone]  Physical Therapy Treatment  Patient Details  Name: Natasha Graham MRN: [id number] Date of Birth: [birth year] Referring Provider (PT): Rolm Bookbinder MD   Encounter Date: 02/01/2020  PT End of Session - 02/01/20 1454    Visit Number  2    Number of Visits  5    Date for PT Re-Evaluation  02/29/20    PT Start Time  1400    PT Stop Time  1450    PT Time Calculation (min)  50 min    Activity Tolerance  Patient tolerated treatment well    Behavior During Therapy  [facility] for tasks assessed/performed       Past Medical History:  Diagnosis Date  . Cancer [facility])    breast cancer  . Hypertension   . PONV (postoperative nausea and vomiting)   . Scoliosis   . Wears glasses     Past Surgical History:  Procedure Laterality Date  . AUGMENTATION MAMMAPLASTY Right    2015 post mastectomy  . AXILLARY LYMPH NODE DISSECTION Left 11/13/2019   Procedure: LEFT AXILLARY LYMPH NODE DISSECTION;  Surgeon: Rolm Bookbinder, MD;  Location: [place];  Service: General;  Laterality: Left;  . BREAST RECONSTRUCTION WITH PLACEMENT OF TISSUE EXPANDER AND FLEX HD (ACELLULAR HYDRATED DERMIS) Right 05/16/2014   Procedure: IMMEDIATE RIGHT BREAST RECONSTRUCTION WITH PLACEMENT OF TISSUE EXPANDER AND FLEX HD (ACELLULAR HYDRATED DERMIS);  Surgeon: Theodoro Kos, DO;  Location: [place];  Service: Plastics;  Laterality: Right;  . BREAST RECONSTRUCTION WITH PLACEMENT OF TISSUE EXPANDER AND FLEX HD (ACELLULAR HYDRATED DERMIS) Left 09/27/2019   Procedure: LEFT BREAST RECONSTRUCTION WITH PLACEMENT OF TISSUE EXPANDER AND FLEX HD (ACELLULAR HYDRATED DERMIS);  Surgeon: Wallace Going, DO;  Location: [place];  Service: Plastics;  Laterality: Left;  . BREAST SURGERY     right breast excisional biopsy  . DILATION AND CURETTAGE OF UTERUS     . HYSTEROSCOPY WITH D & C N/A 09/20/2015   Procedure: DILATATION AND CURETTAGE /HYSTEROSCOPY;  Surgeon: Brien Few, MD;  Location: [facility];  Service: Gynecology;  Laterality: N/A;  . LIPOSUCTION Bilateral 08/08/2014   Procedure: LIPO SUCTION ;  Surgeon: Theodoro Kos, DO;  Location: [place];  Service: Plastics;  Laterality: Bilateral;  . MASTECTOMY Right 05/16/2014   placement of acellular dermal matrix & tissue expanders   . MASTOPEXY Left 08/08/2014   Procedure:  MASTOPEXY FOR SYMMETRY;  Surgeon: Theodoro Kos, DO;  Location: [place];  Service: Plastics;  Laterality: Left;  . NIPPLE SPARING MASTECTOMY WITH SENTINEL LYMPH NODE BIOPSY Left 09/27/2019   Procedure: LEFT NIPPLE SPARING MASTECTOMY WITH LEFT AXILLARY SENTINEL LYMPH NODE BIOPSY;  Surgeon: Rolm Bookbinder, MD;  Location: [place];  Service: General;  Laterality: Left;  . REDUCTION MAMMAPLASTY Left    2015  . REMOVAL OF TISSUE EXPANDER AND PLACEMENT OF IMPLANT Right 08/08/2014   Procedure: REMOVAL OF RIGHT  TISSUE EXPANDERS WITH PLACEMENT OF RIGHT BREAST IMPLANTS WITH LIPO SUCTION ;  Surgeon: Theodoro Kos, DO;  Location: [place];  Service: Plastics;  Laterality: Right;  . SCAR REVISION Left 11/13/2019   Procedure: EXCISION OF LEFT MASTECTOMY SKIN;  Surgeon: Rolm Bookbinder, MD;  Location: [place];  Service: General;  Laterality: Left;  . scoliosis  1972   harrington rods-age 75  . TONSILLECTOMY  There were no vitals filed for this visit.  Subjective Assessment - 02/01/20 1402    Subjective  I have been trying to do the exercises but I am usually give out by the time I get home from radiation.    Pertinent History  Current radiation treatment, invasive lobular carcinoma, grade 1, with perineural invasion present. Prognostic indicators significant for: estrogen receptor, 60% positive with moderate staining intensity and progesterone  receptor, 0% negative. Proliferation marker Ki67 at <1%. L masectomy (09/27/2019 and ALND 11/13/2019), R masectomy 2015.    Patient Stated Goals  I want to manage my L arm for lymphedema becuase I was told about it when I had surgery on my R arm. Also make sure that I have all of my arm mobility.    Currently in Pain?  No/denies    Pain Score  0-No pain                        [facility] Adult PT Treatment/Exercise - 02/01/20 0001      Manual Therapy   Manual Therapy  Manual Lymphatic Drainage (MLD)    Manual Lymphatic Drainage (MLD)  Instructed pt in self MLD as follows avoiding touching all radiated skin in axilla and left lateral chest: short neck, 5 diaphragmatic breaths, left inguinal nodes and establishment of axillo inguinal pathway, L UE working proximal to distal then retracing all steps. Educated pt throughout on correct sequence and hand technique.              PT Education - 02/01/20 1453    Education Details  lymphedema risk reduction practices, importance of holding off on stretching right now due to decreased skin integrity of radiated skin, self MLD    Person(s) Educated  Patient    Methods  Explanation;Handout    Comprehension  Verbalized understanding       PT Short Term Goals - 01/18/20 1759      PT SHORT TERM GOAL #1   Title  Pt will be independent with her HEP    Baseline  pt provided with HEP today    Time  4    Period  Weeks    Status  New    Target Date  02/29/20        PT Long Term Goals - 01/18/20 1759      PT LONG TERM GOAL #1   Title  Pt will be independent with MLD and be fitted for appropriate compression garment to wear on a daily basis to manage edema and improve autonomy of care.    Baseline  Pt currently does not have a compression sleeve or know how to perform MLD.    Time  4    Period  Weeks    Status  New    Target Date  02/29/20      PT LONG TERM GOAL #2   Title  Pt will improve L shoulder ROM to 150 degrees  flexion/abduction in order to improve functional ROM.    Baseline  L shoulder flexoin 120, abduction 110    Time  4    Period  Weeks    Status  New    Target Date  02/29/20      PT LONG TERM GOAL #3   Title  Pt will decrease L proximal brachium and distal antebrachium circumferential measurements by 1 cm in order to demonstrate decreased edema in the LUE.    Baseline  see measurements.  Time  4    Period  Weeks    Status  New    Target Date  02/29/20            Plan - 02/01/20 1455    Clinical Impression Statement  Began instruction in self MLD today for LUE and educated pt in lymphedema risk reduction practices. Pt's skin is very red and fragile looking from radiation so educated pt to hold off on stretches and self MLD until her skin looks less irritated which should be about 2 weeks after completing radiation. She completes radiation on Tuesday. Will postpone PT appointments until 2 weeks after completion of radiation to give skin time to heal.    PT Frequency  1x / week    PT Duration  4 weeks    PT Treatment/Interventions  Therapeutic exercise;Therapeutic activities;Neuromuscular re-education;Manual techniques;Patient/family education    PT Next Visit Plan  see if skin is healed, Teach Patient MLD of the LUE, perform MLD, stretching/myofascial release as needed.    PT Home Exercise Plan  Access Code: [email or web address]    Consulted and Agree with Plan of Care  Patient       Patient will benefit from skilled therapeutic intervention in order to improve the following deficits and impairments:     Visit Diagnosis: Lymphedema, not elsewhere classified     Problem List Patient Active Problem List   Diagnosis Date Noted  . Breast cancer, left breast ([place]) 11/13/2019  . Malignant neoplasm of lower-outer quadrant of left breast of female, estrogen receptor positive ([place]) 10/18/2019  . S/P mastectomy, left 10/06/2019  . Acquired absence of right breast 08/21/2019  . Status post  right breast reconstruction 08/21/2019  . Breast neoplasm, Tis (DCIS), right 05/16/2014  . Atypical ductal hyperplasia of right breast 01/02/2014    Allyson Sabal [facility] 02/01/2020, 2:57 PM  [place], [place], [address] Phone: [phone]   Fax:  [phone]  Name: Natasha Graham MRN: [id number] Date of Birth: [birth year]  Manus Gunning, PT 02/01/20 2:57 PM

## 2020-02-02 ENCOUNTER — Ambulatory Visit
Admission: RE | Admit: 2020-02-02 | Discharge: 2020-02-02 | Disposition: A | Discharge status: Home / Self Care | Payer: BC Managed Care – PPO | Source: Ambulatory Visit | Attending: Radiation Oncology | Admitting: Radiation Oncology

## 2020-02-02 ENCOUNTER — Other Ambulatory Visit: Payer: Self-pay

## 2020-02-02 DIAGNOSIS — C50512 Malignant neoplasm of lower-outer quadrant of left female breast: Secondary | ICD-10-CM | POA: Diagnosis not present

## 2020-02-02 DIAGNOSIS — Z170 Estrogen receptor positive status [ER+]: Secondary | ICD-10-CM | POA: Diagnosis not present

## 2020-02-02 DIAGNOSIS — Z510 Encounter for antineoplastic radiation therapy: Secondary | ICD-10-CM | POA: Diagnosis not present

## 2020-02-05 ENCOUNTER — Ambulatory Visit
Admission: RE | Admit: 2020-02-05 | Discharge: 2020-02-05 | Disposition: A | Discharge status: Home / Self Care | Payer: BC Managed Care – PPO | Source: Ambulatory Visit | Attending: Radiation Oncology | Admitting: Radiation Oncology

## 2020-02-05 ENCOUNTER — Other Ambulatory Visit: Payer: Self-pay

## 2020-02-05 DIAGNOSIS — Z510 Encounter for antineoplastic radiation therapy: Secondary | ICD-10-CM | POA: Diagnosis not present

## 2020-02-05 DIAGNOSIS — C50512 Malignant neoplasm of lower-outer quadrant of left female breast: Secondary | ICD-10-CM | POA: Diagnosis not present

## 2020-02-05 DIAGNOSIS — Z170 Estrogen receptor positive status [ER+]: Secondary | ICD-10-CM | POA: Diagnosis not present

## 2020-02-06 ENCOUNTER — Ambulatory Visit
Admission: RE | Admit: 2020-02-06 | Discharge: 2020-02-06 | Disposition: A | Discharge status: Home / Self Care | Payer: BC Managed Care – PPO | Source: Ambulatory Visit | Attending: Radiation Oncology | Admitting: Radiation Oncology

## 2020-02-06 ENCOUNTER — Encounter: Payer: BC Managed Care – PPO | Admitting: Physical Therapy

## 2020-02-06 ENCOUNTER — Encounter: Payer: Self-pay | Admitting: Radiation Oncology

## 2020-02-06 DIAGNOSIS — Z170 Estrogen receptor positive status [ER+]: Secondary | ICD-10-CM | POA: Diagnosis not present

## 2020-02-06 DIAGNOSIS — Z510 Encounter for antineoplastic radiation therapy: Secondary | ICD-10-CM | POA: Diagnosis not present

## 2020-02-06 DIAGNOSIS — C50512 Malignant neoplasm of lower-outer quadrant of left female breast: Secondary | ICD-10-CM | POA: Diagnosis not present

## 2020-02-13 ENCOUNTER — Encounter: Payer: BC Managed Care – PPO | Admitting: Physical Therapy

## 2020-02-21 ENCOUNTER — Other Ambulatory Visit: Payer: Self-pay

## 2020-02-21 ENCOUNTER — Ambulatory Visit: Payer: BC Managed Care – PPO

## 2020-02-21 DIAGNOSIS — C50512 Malignant neoplasm of lower-outer quadrant of left female breast: Secondary | ICD-10-CM

## 2020-02-21 DIAGNOSIS — M25512 Pain in left shoulder: Secondary | ICD-10-CM

## 2020-02-21 DIAGNOSIS — I890 Lymphedema, not elsewhere classified: Secondary | ICD-10-CM | POA: Diagnosis not present

## 2020-02-21 DIAGNOSIS — Z170 Estrogen receptor positive status [ER+]: Secondary | ICD-10-CM | POA: Diagnosis not present

## 2020-02-21 DIAGNOSIS — M25612 Stiffness of left shoulder, not elsewhere classified: Secondary | ICD-10-CM

## 2020-02-21 NOTE — Therapy (Signed)
[place], [place], [address] Phone: [phone]   Fax:  [phone]  Physical Therapy Treatment  Patient Details  Name: Natasha Graham MRN: [id number] Date of Birth: [birth year] Referring Provider (PT): Rolm Bookbinder MD   Encounter Date: 02/21/2020   PT End of Session - 02/21/20 1608    Visit Number 3    Number of Visits 5    Date for PT Re-Evaluation 02/29/20    PT Start Time 1608    PT Stop Time 1653    PT Time Calculation (min) 45 min    Activity Tolerance Patient tolerated treatment well    Behavior During Therapy [facility] for tasks assessed/performed           Past Medical History:  Diagnosis Date  . Cancer [facility])    breast cancer  . Hypertension   . PONV (postoperative nausea and vomiting)   . Scoliosis   . Wears glasses     Past Surgical History:  Procedure Laterality Date  . AUGMENTATION MAMMAPLASTY Right    2015 post mastectomy  . AXILLARY LYMPH NODE DISSECTION Left 11/13/2019   Procedure: LEFT AXILLARY LYMPH NODE DISSECTION;  Surgeon: Rolm Bookbinder, MD;  Location: [place];  Service: General;  Laterality: Left;  . BREAST RECONSTRUCTION WITH PLACEMENT OF TISSUE EXPANDER AND FLEX HD (ACELLULAR HYDRATED DERMIS) Right 05/16/2014   Procedure: IMMEDIATE RIGHT BREAST RECONSTRUCTION WITH PLACEMENT OF TISSUE EXPANDER AND FLEX HD (ACELLULAR HYDRATED DERMIS);  Surgeon: Theodoro Kos, DO;  Location: [place];  Service: Plastics;  Laterality: Right;  . BREAST RECONSTRUCTION WITH PLACEMENT OF TISSUE EXPANDER AND FLEX HD (ACELLULAR HYDRATED DERMIS) Left 09/27/2019   Procedure: LEFT BREAST RECONSTRUCTION WITH PLACEMENT OF TISSUE EXPANDER AND FLEX HD (ACELLULAR HYDRATED DERMIS);  Surgeon: Wallace Going, DO;  Location: [place];  Service: Plastics;  Laterality: Left;  . BREAST SURGERY     right breast excisional biopsy  . DILATION AND CURETTAGE OF UTERUS     . HYSTEROSCOPY WITH D & C N/A 09/20/2015   Procedure: DILATATION AND CURETTAGE /HYSTEROSCOPY;  Surgeon: Brien Few, MD;  Location: [place] [facility];  Service: Gynecology;  Laterality: N/A;  . LIPOSUCTION Bilateral 08/08/2014   Procedure: LIPO SUCTION ;  Surgeon: Theodoro Kos, DO;  Location: [place];  Service: Plastics;  Laterality: Bilateral;  . MASTECTOMY Right 05/16/2014   placement of acellular dermal matrix & tissue expanders   . MASTOPEXY Left 08/08/2014   Procedure:  MASTOPEXY FOR SYMMETRY;  Surgeon: Theodoro Kos, DO;  Location: [place];  Service: Plastics;  Laterality: Left;  . NIPPLE SPARING MASTECTOMY WITH SENTINEL LYMPH NODE BIOPSY Left 09/27/2019   Procedure: LEFT NIPPLE SPARING MASTECTOMY WITH LEFT AXILLARY SENTINEL LYMPH NODE BIOPSY;  Surgeon: Rolm Bookbinder, MD;  Location: [place];  Service: General;  Laterality: Left;  . REDUCTION MAMMAPLASTY Left    2015  . REMOVAL OF TISSUE EXPANDER AND PLACEMENT OF IMPLANT Right 08/08/2014   Procedure: REMOVAL OF RIGHT  TISSUE EXPANDERS WITH PLACEMENT OF RIGHT BREAST IMPLANTS WITH LIPO SUCTION ;  Surgeon: Theodoro Kos, DO;  Location: [place];  Service: Plastics;  Laterality: Right;  . SCAR REVISION Left 11/13/2019   Procedure: EXCISION OF LEFT MASTECTOMY SKIN;  Surgeon: Rolm Bookbinder, MD;  Location: [place];  Service: General;  Laterality: Left;  . scoliosis  1972   harrington rods-age 59  . TONSILLECTOMY      There were  no vitals filed for this visit.   Subjective Assessment - 02/21/20 1609    Subjective Pt states that her skin is much better than last time she was at physical therapy.    Pertinent History Current radiation treatment, invasive lobular carcinoma, grade 1, with perineural invasion present. Prognostic indicators significant for: estrogen receptor, 60% positive with moderate staining intensity and progesterone receptor, 0% negative.  Proliferation marker Ki67 at <1%. L masectomy (09/27/2019 and ALND 11/13/2019), R masectomy 2015.    Patient Stated Goals I want to manage my L arm for lymphedema becuase I was told about it when I had surgery on my R arm. Also make sure that I have all of my arm mobility.    Currently in Pain? No/denies    Pain Score 0-No pain                             [facility] Adult PT Treatment/Exercise - 02/21/20 0001      Manual Therapy   Manual Therapy Manual Lymphatic Drainage (MLD);Passive ROM;Myofascial release;Soft tissue mobilization    Soft tissue mobilization Petrissage over cording and scar tissue in the L breast with good improvement as demonstrated by less visibility with increased abduction/flexion following 10x petrissage with movement into abduction.     Myofascial Release easy myofascial release avoiding possible open areas on the medial and lateral incisions on the inferior L breast, superior expander and L axilla longitudinally     Manual Lymphatic Drainage (MLD) In supine: short neck, swimming in the terminus, bil axillary and L inguinal nodes, anterior inter-axillary anastomosis, L axillo-inguinal anastomosis, L shoulder, lateral brachium, medial to lateral brachium, lateral brachium, all surfaces of the antebrachium, dorsum of the hand, re-worked all surfaces of the arm, re-worked anasotmosis, R side-lying posterior inter-axillary anastomosis 10x then L axillo-inguinal anastomosis in side-lying; deep abdominals.     Passive ROM P/ROM into flexion/abduction with intermittent myofascial release and petrissage                  PT Education - 02/21/20 1701    Education Details Pt will start to work on her post-op exercises due to they were on hold secondary to inflamed skin related to radiation.    Person(s) Educated Patient    Methods Explanation    Comprehension Verbalized understanding            PT Short Term Goals - 01/18/20 1759      PT SHORT TERM GOAL  #1   Title Pt will be independent with her HEP    Baseline pt provided with HEP today    Time 4    Period Weeks    Status New    Target Date 02/29/20             PT Long Term Goals - 01/18/20 1759      PT LONG TERM GOAL #1   Title Pt will be independent with MLD and be fitted for appropriate compression garment to wear on a daily basis to manage edema and improve autonomy of care.    Baseline Pt currently does not have a compression sleeve or know how to perform MLD.    Time 4    Period Weeks    Status New    Target Date 02/29/20      PT LONG TERM GOAL #2   Title Pt will improve L shoulder ROM to 150 degrees flexion/abduction in order to improve functional ROM.  Baseline L shoulder flexoin 120, abduction 110    Time 4    Period Weeks    Status New    Target Date 02/29/20      PT LONG TERM GOAL #3   Title Pt will decrease L proximal brachium and distal antebrachium circumferential measurements by 1 cm in order to demonstrate decreased edema in the LUE.    Baseline see measurements.    Time 4    Period Weeks    Status New    Target Date 02/29/20                 Plan - 02/21/20 1608    Clinical Impression Statement Continued instructing pt on self MLD; she states she does not have any questions. Re-iterated education on trunk MLD due to this was not able to be done last session. Pt demonstrates no noted edema in the LUE; edema was noted in the posterior L upper quadrant; time spent in R side-lying working edema through posterior inter-axillary anastomosis and along axillo-inguinal anastomosis on the L. Pt does have large central scar from previous spinal surgery that may impede fluid flow. Easy myofascial release was performed along with petrissage during P/ROM into flexion/abduction keeping tension off incision due to possible open areas. MD is aware and pt has cream she was provided to put on incision. Pt will benefit from continued POC at this time.    Personal  Factors and Comorbidities Comorbidity 2    Comorbidities bil masectomy, ALND of the L axilla, 7 lymph node removal, radiation on the L    Rehab Potential Good    PT Frequency 1x / week    PT Duration 4 weeks    PT Treatment/Interventions Therapeutic exercise;Therapeutic activities;Neuromuscular re-education;Manual techniques;Patient/family education    PT Next Visit Plan update HEP, assess incision, perform MLD, stretching/myofascial release as needed.    PT Home Exercise Plan Access Code: [email or web address]    Consulted and Agree with Plan of Care Patient           Patient will benefit from skilled therapeutic intervention in order to improve the following deficits and impairments:  Increased edema, Pain, Decreased range of motion  Visit Diagnosis: Lymphedema, not elsewhere classified  Malignant neoplasm of lower-outer quadrant of left breast of female, estrogen receptor positive ([facility])  Stiffness of left shoulder, not elsewhere classified  Acute pain of left shoulder     Problem List Patient Active Problem List   Diagnosis Date Noted  . Breast cancer, left breast ([place]) 11/13/2019  . Malignant neoplasm of lower-outer quadrant of left breast of female, estrogen receptor positive ([place]) 10/18/2019  . S/P mastectomy, left 10/06/2019  . Acquired absence of right breast 08/21/2019  . Status post right breast reconstruction 08/21/2019  . Breast neoplasm, Tis (DCIS), right 05/16/2014  . Atypical ductal hyperplasia of right breast 01/02/2014    Ander Purpura, PT 02/21/2020, 5:05 PM  [place], [place], [address] Phone: [phone]   Fax:  [phone]  Name: ANALA WHISENANT MRN: [id number] Date of Birth: [birth year]

## 2020-02-28 ENCOUNTER — Other Ambulatory Visit: Payer: Self-pay

## 2020-02-28 ENCOUNTER — Encounter: Payer: Self-pay | Admitting: Physical Therapy

## 2020-02-28 ENCOUNTER — Ambulatory Visit: Payer: BC Managed Care – PPO | Admitting: Physical Therapy

## 2020-02-28 DIAGNOSIS — M25612 Stiffness of left shoulder, not elsewhere classified: Secondary | ICD-10-CM

## 2020-02-28 DIAGNOSIS — I890 Lymphedema, not elsewhere classified: Secondary | ICD-10-CM | POA: Diagnosis not present

## 2020-02-28 DIAGNOSIS — M25512 Pain in left shoulder: Secondary | ICD-10-CM

## 2020-02-28 DIAGNOSIS — C50512 Malignant neoplasm of lower-outer quadrant of left female breast: Secondary | ICD-10-CM | POA: Diagnosis not present

## 2020-02-28 DIAGNOSIS — Z170 Estrogen receptor positive status [ER+]: Secondary | ICD-10-CM | POA: Diagnosis not present

## 2020-02-28 NOTE — Therapy (Signed)
[place], [place], [address] Phone: [phone]   Fax:  [phone]  Physical Therapy Treatment  Patient Details  Name: Natasha Graham MRN: [id number] Date of Birth: [birth year] Referring Provider (PT): Rolm Bookbinder MD   Encounter Date: 02/28/2020   PT End of Session - 02/28/20 1659    Visit Number 4    Number of Visits 5    Date for PT Re-Evaluation 02/29/20    PT Start Time 1884    PT Stop Time 1652    PT Time Calculation (min) 46 min    Activity Tolerance Patient tolerated treatment well    Behavior During Therapy [facility] for tasks assessed/performed           Past Medical History:  Diagnosis Date  . Cancer [facility])    breast cancer  . Hypertension   . PONV (postoperative nausea and vomiting)   . Scoliosis   . Wears glasses     Past Surgical History:  Procedure Laterality Date  . AUGMENTATION MAMMAPLASTY Right    2015 post mastectomy  . AXILLARY LYMPH NODE DISSECTION Left 11/13/2019   Procedure: LEFT AXILLARY LYMPH NODE DISSECTION;  Surgeon: Rolm Bookbinder, MD;  Location: [place];  Service: General;  Laterality: Left;  . BREAST RECONSTRUCTION WITH PLACEMENT OF TISSUE EXPANDER AND FLEX HD (ACELLULAR HYDRATED DERMIS) Right 05/16/2014   Procedure: IMMEDIATE RIGHT BREAST RECONSTRUCTION WITH PLACEMENT OF TISSUE EXPANDER AND FLEX HD (ACELLULAR HYDRATED DERMIS);  Surgeon: Theodoro Kos, DO;  Location: [place];  Service: Plastics;  Laterality: Right;  . BREAST RECONSTRUCTION WITH PLACEMENT OF TISSUE EXPANDER AND FLEX HD (ACELLULAR HYDRATED DERMIS) Left 09/27/2019   Procedure: LEFT BREAST RECONSTRUCTION WITH PLACEMENT OF TISSUE EXPANDER AND FLEX HD (ACELLULAR HYDRATED DERMIS);  Surgeon: Wallace Going, DO;  Location: [place];  Service: Plastics;  Laterality: Left;  . BREAST SURGERY     right breast excisional biopsy  . DILATION AND CURETTAGE OF UTERUS      . HYSTEROSCOPY WITH D & C N/A 09/20/2015   Procedure: DILATATION AND CURETTAGE /HYSTEROSCOPY;  Surgeon: Brien Few, MD;  Location: [facility];  Service: Gynecology;  Laterality: N/A;  . LIPOSUCTION Bilateral 08/08/2014   Procedure: LIPO SUCTION ;  Surgeon: Theodoro Kos, DO;  Location: [place];  Service: Plastics;  Laterality: Bilateral;  . MASTECTOMY Right 05/16/2014   placement of acellular dermal matrix & tissue expanders   . MASTOPEXY Left 08/08/2014   Procedure:  MASTOPEXY FOR SYMMETRY;  Surgeon: Theodoro Kos, DO;  Location: [place];  Service: Plastics;  Laterality: Left;  . NIPPLE SPARING MASTECTOMY WITH SENTINEL LYMPH NODE BIOPSY Left 09/27/2019   Procedure: LEFT NIPPLE SPARING MASTECTOMY WITH LEFT AXILLARY SENTINEL LYMPH NODE BIOPSY;  Surgeon: Rolm Bookbinder, MD;  Location: [place];  Service: General;  Laterality: Left;  . REDUCTION MAMMAPLASTY Left    2015  . REMOVAL OF TISSUE EXPANDER AND PLACEMENT OF IMPLANT Right 08/08/2014   Procedure: REMOVAL OF RIGHT  TISSUE EXPANDERS WITH PLACEMENT OF RIGHT BREAST IMPLANTS WITH LIPO SUCTION ;  Surgeon: Theodoro Kos, DO;  Location: [place];  Service: Plastics;  Laterality: Right;  . SCAR REVISION Left 11/13/2019   Procedure: EXCISION OF LEFT MASTECTOMY SKIN;  Surgeon: Rolm Bookbinder, MD;  Location: [place];  Service: General;  Laterality: Left;  . scoliosis  1972   harrington rods-age 39  . TONSILLECTOMY      There  were no vitals filed for this visit.   Subjective Assessment - 02/28/20 1606    Subjective Pt reports her skin looks much better. I still have pulling when I am raising my arm up.    Pertinent History Current radiation treatment, invasive lobular carcinoma, grade 1, with perineural invasion present. Prognostic indicators significant for: estrogen receptor, 60% positive with moderate staining intensity and progesterone receptor, 0%  negative. Proliferation marker Ki67 at <1%. L masectomy (09/27/2019 and ALND 11/13/2019), R masectomy 2015.    Patient Stated Goals I want to manage my L arm for lymphedema becuase I was told about it when I had surgery on my R arm. Also make sure that I have all of my arm mobility.    Currently in Pain? Yes    Pain Score 3     Pain Location Hand    Pain Orientation Left    Pain Descriptors / Indicators Sore    Pain Type Acute pain    Pain Onset In the past 7 days    Pain Frequency Intermittent    Aggravating Factors  touching it    Pain Relieving Factors not touching it              [facility] PT Assessment - 02/28/20 0001      AROM   Left Shoulder Flexion 165 Degrees    Left Shoulder ABduction 171 Degrees                         [facility] Adult PT Treatment/Exercise - 02/28/20 0001      Shoulder Exercises: Supine   Horizontal ABduction Strengthening;Left;10 reps;Theraband    Theraband Level (Shoulder Horizontal ABduction) Level 1 (Yellow)    Horizontal ABduction Limitations pt returned therapist demo, v/c for band across chest and elbows straight    External Rotation Strengthening;Both;10 reps;Theraband    Theraband Level (Shoulder External Rotation) Level 1 (Yellow)    External Rotation Limitations v/c for elbows tucked at sides and bent to 90 degrees    Flexion Strengthening;Both;10 reps;Theraband   narrow and wide grip   Theraband Level (Shoulder Flexion) Level 1 (Yellow)    Flexion Limitations pt returned therapist demo    Diagonals Strengthening;Both;10 reps;Theraband    Theraband Level (Shoulder Diagonals) Level 1 (Yellow)    Diagonals Limitations v/c for thumb pointed down by hip and rotates up by head      Manual Therapy   Manual Lymphatic Drainage (MLD) In supine: short neck, 5 diaphragmatic breaths, left inginal nodes and establishment of axillo inguinal pathway, left lateral trunk moving fluid towards pathway then to R sidelying to focus on lateral trunk  then retracing all steps                    PT Short Term Goals - 01/18/20 1759      PT SHORT TERM GOAL #1   Title Pt will be independent with her HEP    Baseline pt provided with HEP today    Time 4    Period Weeks    Status New    Target Date 02/29/20             PT Long Term Goals - 01/18/20 1759      PT LONG TERM GOAL #1   Title Pt will be independent with MLD and be fitted for appropriate compression garment to wear on a daily basis to manage edema and improve autonomy of care.    Baseline  Pt currently does not have a compression sleeve or know how to perform MLD.    Time 4    Period Weeks    Status New    Target Date 02/29/20      PT LONG TERM GOAL #2   Title Pt will improve L shoulder ROM to 150 degrees flexion/abduction in order to improve functional ROM.    Baseline L shoulder flexoin 120, abduction 110    Time 4    Period Weeks    Status New    Target Date 02/29/20      PT LONG TERM GOAL #3   Title Pt will decrease L proximal brachium and distal antebrachium circumferential measurements by 1 cm in order to demonstrate decreased edema in the LUE.    Baseline see measurements.    Time 4    Period Weeks    Status New    Target Date 02/29/20                 Plan - 02/28/20 1700    Clinical Impression Statement Incision under breast appears completely closed at this time. Pt reports it has been closed for some time. Began AAROM exercises today and pt demonstrated greatly improved ROM. ROM measurements taken today demonstrate nearly full ROM. Began instructing pt in strengthening exercises for scapula and issued these as part of an HEP. Spent end of session working on area of swelling in L lateral trunk to help improve comfort.    PT Frequency 1x / week    PT Duration 4 weeks    PT Treatment/Interventions Therapeutic exercise;Therapeutic activities;Neuromuscular re-education;Manual techniques;Patient/family education    PT Next Visit Plan  assess indep with supine scap, assess incision, perform MLD, stretching/myofascial release as needed.    Consulted and Agree with Plan of Care Patient           Patient will benefit from skilled therapeutic intervention in order to improve the following deficits and impairments:  Increased edema, Pain, Decreased range of motion  Visit Diagnosis: Stiffness of left shoulder, not elsewhere classified  Acute pain of left shoulder  Lymphedema, not elsewhere classified     Problem List Patient Active Problem List   Diagnosis Date Noted  . Breast cancer, left breast ([place]) 11/13/2019  . Malignant neoplasm of lower-outer quadrant of left breast of female, estrogen receptor positive ([place]) 10/18/2019  . S/P mastectomy, left 10/06/2019  . Acquired absence of right breast 08/21/2019  . Status post right breast reconstruction 08/21/2019  . Breast neoplasm, Tis (DCIS), right 05/16/2014  . Atypical ductal hyperplasia of right breast 01/02/2014    Allyson Sabal [facility] 02/28/2020, 5:02 PM  [place], [place], [address] Phone: [phone]   Fax:  [phone]  Name: Natasha Graham MRN: [id number] Date of Birth: Mar 24, 1961  Manus Gunning, PT 02/28/20 5:02 PM

## 2020-02-28 NOTE — Patient Instructions (Signed)
Over Head Pull: Narrow and Wide Grip   Cancer Rehab [phone]   On back, knees bent, feet flat, band across thighs, elbows straight but relaxed. Pull hands apart (start). Keeping elbows straight, bring arms up and over head, hands toward floor. Keep pull steady on band. Hold momentarily. Return slowly, keeping pull steady, back to start. Then do same with a wider grip on the band (past shoulder width) Repeat _10__ times. Band color __yellow____   Side Pull: Double Arm   On back, knees bent, feet flat. Arms perpendicular to body, shoulder level, elbows straight but relaxed. Pull arms out to sides, elbows straight. Resistance band comes across collarbones, hands toward floor. Hold momentarily. Slowly return to starting position. Repeat _10__ times. Band color _yellow____   Sword   On back, knees bent, feet flat, left hand on left hip, right hand above left. Pull right arm DIAGONALLY (hip to shoulder) across chest. Bring right arm along head toward floor. Hold momentarily. Slowly return to starting position. Thumb pointed down when by hip and rotates upward when by head.  Repeat _10__ times. Do with left arm. Band color _yellow_____   Shoulder Rotation: Double Arm   On back, knees bent, feet flat, elbows tucked at sides, bent 90, hands palms up. Pull hands apart and down toward floor, keeping elbows near sides. Hold momentarily. Slowly return to starting position. Repeat _10__ times. Band color __yellow____

## 2020-03-06 ENCOUNTER — Encounter: Payer: Self-pay | Admitting: Physical Therapy

## 2020-03-06 ENCOUNTER — Other Ambulatory Visit: Payer: Self-pay

## 2020-03-06 ENCOUNTER — Ambulatory Visit: Payer: BC Managed Care – PPO | Attending: General Surgery | Admitting: Physical Therapy

## 2020-03-06 DIAGNOSIS — M25612 Stiffness of left shoulder, not elsewhere classified: Secondary | ICD-10-CM | POA: Diagnosis not present

## 2020-03-06 NOTE — Therapy (Signed)
[place], [place], [address] Phone: [phone]   Fax:  [phone]  Physical Therapy Treatment  Patient Details  Name: Natasha Graham MRN: [id number] Date of Birth: 1961-03-26 Referring Provider (PT): Rolm Bookbinder MD   Encounter Date: 03/06/2020   PT End of Session - 03/06/20 1648    Visit Number 5    Number of Visits 5    Date for PT Re-Evaluation 02/29/20    PT Start Time 1606    PT Stop Time 1646    PT Time Calculation (min) 40 min    Activity Tolerance Patient tolerated treatment well    Behavior During Therapy [facility] for tasks assessed/performed           Past Medical History:  Diagnosis Date  . Cancer [facility])    breast cancer  . Hypertension   . PONV (postoperative nausea and vomiting)   . Scoliosis   . Wears glasses     Past Surgical History:  Procedure Laterality Date  . AUGMENTATION MAMMAPLASTY Right    2015 post mastectomy  . AXILLARY LYMPH NODE DISSECTION Left 11/13/2019   Procedure: LEFT AXILLARY LYMPH NODE DISSECTION;  Surgeon: Rolm Bookbinder, MD;  Location: [place];  Service: General;  Laterality: Left;  . BREAST RECONSTRUCTION WITH PLACEMENT OF TISSUE EXPANDER AND FLEX HD (ACELLULAR HYDRATED DERMIS) Right 05/16/2014   Procedure: IMMEDIATE RIGHT BREAST RECONSTRUCTION WITH PLACEMENT OF TISSUE EXPANDER AND FLEX HD (ACELLULAR HYDRATED DERMIS);  Surgeon: Theodoro Kos, DO;  Location: [place];  Service: Plastics;  Laterality: Right;  . BREAST RECONSTRUCTION WITH PLACEMENT OF TISSUE EXPANDER AND FLEX HD (ACELLULAR HYDRATED DERMIS) Left 09/27/2019   Procedure: LEFT BREAST RECONSTRUCTION WITH PLACEMENT OF TISSUE EXPANDER AND FLEX HD (ACELLULAR HYDRATED DERMIS);  Surgeon: Wallace Going, DO;  Location: [place];  Service: Plastics;  Laterality: Left;  . BREAST SURGERY     right breast excisional biopsy  . DILATION AND CURETTAGE OF UTERUS      . HYSTEROSCOPY WITH D & C N/A 09/20/2015   Procedure: DILATATION AND CURETTAGE /HYSTEROSCOPY;  Surgeon: Brien Few, MD;  Location: [place] [facility];  Service: Gynecology;  Laterality: N/A;  . LIPOSUCTION Bilateral 08/08/2014   Procedure: LIPO SUCTION ;  Surgeon: Theodoro Kos, DO;  Location: [place];  Service: Plastics;  Laterality: Bilateral;  . MASTECTOMY Right 05/16/2014   placement of acellular dermal matrix & tissue expanders   . MASTOPEXY Left 08/08/2014   Procedure:  MASTOPEXY FOR SYMMETRY;  Surgeon: Theodoro Kos, DO;  Location: [place];  Service: Plastics;  Laterality: Left;  . NIPPLE SPARING MASTECTOMY WITH SENTINEL LYMPH NODE BIOPSY Left 09/27/2019   Procedure: LEFT NIPPLE SPARING MASTECTOMY WITH LEFT AXILLARY SENTINEL LYMPH NODE BIOPSY;  Surgeon: Rolm Bookbinder, MD;  Location: [place];  Service: General;  Laterality: Left;  . REDUCTION MAMMAPLASTY Left    2015  . REMOVAL OF TISSUE EXPANDER AND PLACEMENT OF IMPLANT Right 08/08/2014   Procedure: REMOVAL OF RIGHT  TISSUE EXPANDERS WITH PLACEMENT OF RIGHT BREAST IMPLANTS WITH LIPO SUCTION ;  Surgeon: Theodoro Kos, DO;  Location: [place];  Service: Plastics;  Laterality: Right;  . SCAR REVISION Left 11/13/2019   Procedure: EXCISION OF LEFT MASTECTOMY SKIN;  Surgeon: Rolm Bookbinder, MD;  Location: [place];  Service: General;  Laterality: Left;  . scoliosis  1972   harrington rods-age 59  . TONSILLECTOMY      There  were no vitals filed for this visit.   Subjective Assessment - 03/06/20 1605    Subjective My shoulder is doing good. I picked blueberries for 4 hours over the weekend.    Pertinent History Current radiation treatment, invasive lobular carcinoma, grade 1, with perineural invasion present. Prognostic indicators significant for: estrogen receptor, 60 positive with moderate staining intensity and progesterone receptor, 0% negative.  Proliferation marker Ki67 at <1%. L masectomy (09/27/2019 and ALND 11/13/2019), R masectomy 2015.    Patient Stated Goals I want to manage my L arm for lymphedema becuase I was told about it when I had surgery on my R arm. Also make sure that I have all of my arm mobility.    Currently in Pain? No/denies    Pain Score 0-No pain                 LYMPHEDEMA/ONCOLOGY QUESTIONNAIRE - 03/06/20 0001      Left Upper Extremity Lymphedema   15 cm Proximal to Olecranon Process 35.7 cm    10 cm Proximal to Olecranon Process 32 cm    Olecranon Process 27.5 cm    15 cm Proximal to Ulnar Styloid Process 26 cm    10 cm Proximal to Ulnar Styloid Process 22 cm    Just Proximal to Ulnar Styloid Process 17.1 cm    Across Hand at [employer] 19.2 cm    At [place] of 2nd Digit 6.5 cm                      [facility] Adult PT Treatment/Exercise - 03/06/20 0001      Shoulder Exercises: Supine   Horizontal ABduction Strengthening;Left;10 reps;Theraband    Theraband Level (Shoulder Horizontal ABduction) Level 2 (Red)    Horizontal ABduction Limitations pt returned therapist demo, v/c for band across chest and elbows straight    External Rotation Strengthening;Both;10 reps;Theraband    Theraband Level (Shoulder External Rotation) Level 2 (Red)    External Rotation Limitations v/c for elbows tucked at sides and bent to 90 degrees    Flexion Strengthening;Both;10 reps;Theraband   narrow and wide grip   Theraband Level (Shoulder Flexion) Level 2 (Red)    Diagonals Strengthening;Both;10 reps;Theraband    Theraband Level (Shoulder Diagonals) Level 2 (Red)    Diagonals Limitations v/c for thumb pointed down by hip and rotates up by head      Shoulder Exercises: Standing   Other Standing Exercises instructed pt in 3 way shoulder raises with 2lb weights with back against wall x 10 reps each and pt returned therapist demo      Manual Therapy   Passive ROM in to flexion and abduction with prolonged  holds at end range                    PT Short Term Goals - 03/06/20 1606      PT SHORT TERM GOAL #1   Title Pt will be independent with her HEP    Baseline pt provided with HEP today, 03/06/20- pt reports she is independent with these    Time 4    Period Weeks    Status Achieved             PT Long Term Goals - 03/06/20 1606      PT LONG TERM GOAL #1   Title Pt will be independent with MLD and be fitted for appropriate compression garment to wear on a daily basis to manage edema  and improve autonomy of care.    Baseline Pt currently does not have a compression sleeve or know how to perform MLD, 03/06/20- pt reports she is independent in MLD and has a compression sleeve    Time 4    Period Weeks    Status Achieved      PT LONG TERM GOAL #2   Title Pt will improve L shoulder ROM to 150 degrees flexion/abduction in order to improve functional ROM.    Baseline L shoulder flexoin 120, abduction 110, 03/06/20- flexion- 164, abduction- 173    Time 4    Period Weeks    Status Achieved      PT LONG TERM GOAL #3   Title Pt will decrease L proximal brachium and distal antebrachium circumferential measurements by 1 cm in order to demonstrate decreased edema in the LUE.    Baseline see measurements, 03/06/20- she decreased by 1 cm at forearm    Time 4    Period Weeks    Status Achieved                 Plan - 03/06/20 1703    Clinical Impression Statement Assessed pt's progress towards goals in therapy. She has met all goals for therapy. Her ROM is [facility] and she no longer has tightness in axilla limiting her range. Instructed pt in 3 way shoulder raises which she can progress at home and increased resistance with supine scapular exercises to red band. Pt has no other skilled needs at this time and will be discharged.    PT Frequency 1x / week    PT Duration 4 weeks    PT Treatment/Interventions Therapeutic exercise;Therapeutic activities;Neuromuscular re-education;Manual  techniques;Patient/family education    PT Next Visit Plan d/c this vist    PT Home Exercise Plan Access Code: [email or web address]    Consulted and Agree with Plan of Care Patient           Patient will benefit from skilled therapeutic intervention in order to improve the following deficits and impairments:  Increased edema, Pain, Decreased range of motion  Visit Diagnosis: Stiffness of left shoulder, not elsewhere classified     Problem List Patient Active Problem List   Diagnosis Date Noted  . Breast cancer, left breast ([place]) 11/13/2019  . Malignant neoplasm of lower-outer quadrant of left breast of female, estrogen receptor positive ([place]) 10/18/2019  . S/P mastectomy, left 10/06/2019  . Acquired absence of right breast 08/21/2019  . Status post right breast reconstruction 08/21/2019  . Breast neoplasm, Tis (DCIS), right 05/16/2014  . Atypical ductal hyperplasia of right breast 01/02/2014    Allyson Sabal [facility] 03/06/2020, 5:07 PM  [place], [place], [address] Phone: [phone]   Fax:  [phone]  Name: ICEL CASTLES MRN: [id number] Date of Birth: 04-20-1961  PHYSICAL THERAPY DISCHARGE SUMMARY  Visits from Start of Care: 5  Current functional level related to goals / functional outcomes: All goals met   Remaining deficits: none   Education / Equipment: HEP, compression garment, self MLD  Plan: Patient agrees to discharge.  Patient goals were met. Patient is being discharged due to meeting the stated rehab goals.  ?????    Allyson Sabal [place], [place] 03/06/20 5:07 PM

## 2020-03-06 NOTE — Patient Instructions (Signed)

## 2020-03-11 NOTE — Progress Notes (Incomplete)
  Patient Name: Natasha Graham MRN: [id number] DOB: 06-16-1961 Referring Physician: Lurline Del (Profile Not Attached) Date of Service: 02/06/2020 [facility] [place]                                                        End Of Treatment Note  Diagnoses: C50.512-Malignant neoplasm of lower-outer quadrant of left female breast  Cancer Staging: Stage IIIA (pT3, pN1a) Left Breast LOQ, Invasive Lobular Carcinoma with LCIS, ER+ / PR- / Her2-, Grade 2  Intent: Curative  Radiation Treatment Dates: 12/26/2019 through 02/06/2020 Site Technique Total Dose (Gy) Dose per Fx (Gy) Completed Fx Beam Energies  Chest Wall, Left: CW_Lt_Bst Electron 10/10 2 5/5 6E  Sclav-LT: SCV_Lt Complex 50/50 2 25/25 6X, 15X  Sclav-LT: SCV_Lt_Bst Complex 8/8 2 4/4 6X, 15X  Chest Wall, Left: CW_Lt 3D 50/50 2 25/25 6X, 10X, 15X   Narrative: The patient tolerated radiation therapy relatively well. She denied breast pain. However, she did report some ongoing neck pain and mild fatigue from the hour drive home after treatment. On 01/09/2020, the reconstructed left breast area showed some erythema and hyperpigmentation changes without skin breakdown. As treatment continued, she developed some erythema and radiation dermatitis to the upper outer aspect of the left breast and supraclavicular region. There was no skin breakdown.   She was set up for a boost field directed to the left supraclavicular region given the findings on the PET scan (10/31/2019) with residual disease in the left axilla/SCF.   On the last day of treatment, the patient reported tenderness, burning, and tightness along the left side of her chest. She was noted to have hyperpigmentation changes throughout the treatment area with brisk erythema and some small areas of moist desquamation. There  were no signs of infection. She was using Neosporin but was also given Silvadene in the event that her skin was to become more significant over the following days.  Plan: The patient will follow-up with radiation oncology in one month.  ________________________________________________   Blair Promise, PhD, MD  This document serves as a record of services personally performed by Gery Pray, MD. It was created on his behalf by Clerance Lav, a trained medical scribe. The creation of this record is based on the scribe's personal observations and the provider's statements to them. This document has been checked and approved by the attending provider.

## 2020-03-18 ENCOUNTER — Encounter: Payer: Self-pay | Admitting: Radiation Oncology

## 2020-03-18 ENCOUNTER — Other Ambulatory Visit: Payer: Self-pay

## 2020-03-18 ENCOUNTER — Ambulatory Visit
Admission: RE | Admit: 2020-03-18 | Discharge: 2020-03-18 | Disposition: A | Discharge status: Home / Self Care | Payer: BC Managed Care – PPO | Source: Ambulatory Visit | Attending: Radiation Oncology | Admitting: Radiation Oncology

## 2020-03-18 VITALS — BP 133/72 | HR 84 | Temp 98.7°F | Resp 18 | Ht 63.0 in | Wt 169.5 lb

## 2020-03-18 DIAGNOSIS — Z923 Personal history of irradiation: Secondary | ICD-10-CM | POA: Insufficient documentation

## 2020-03-18 DIAGNOSIS — C50512 Malignant neoplasm of lower-outer quadrant of left female breast: Secondary | ICD-10-CM | POA: Diagnosis not present

## 2020-03-18 DIAGNOSIS — Z79810 Long term (current) use of selective estrogen receptor modulators (SERMs): Secondary | ICD-10-CM | POA: Insufficient documentation

## 2020-03-18 DIAGNOSIS — M25512 Pain in left shoulder: Secondary | ICD-10-CM | POA: Insufficient documentation

## 2020-03-18 DIAGNOSIS — C50012 Malignant neoplasm of nipple and areola, left female breast: Secondary | ICD-10-CM

## 2020-03-18 DIAGNOSIS — M25612 Stiffness of left shoulder, not elsewhere classified: Secondary | ICD-10-CM | POA: Diagnosis not present

## 2020-03-18 DIAGNOSIS — Z170 Estrogen receptor positive status [ER+]: Secondary | ICD-10-CM | POA: Insufficient documentation

## 2020-03-18 NOTE — Progress Notes (Signed)
Patient here for a 1 month f/u visit. Reports itching and sensitivity at her left breast that is better th an before. Has finished her PT. Denies mobility problems or swelling in her left arm.  BP 133/72 (BP Location: Right Arm, Patient Position: Sitting)   Pulse 84   Temp 98.7 F (37.1 C) (Oral)   Resp 18   Ht 5\' 3"  (1.6 m)   Wt 169 lb 8 oz (76.9 kg)   LMP 10/16/2013   SpO2 100%   BMI 30.03 kg/m   Wt Readings from Last 3 Encounters:  03/18/20 169 lb 8 oz (76.9 kg)  12/12/19 171 lb (77.6 kg)  12/07/19 171 lb (77.6 kg)

## 2020-03-18 NOTE — Progress Notes (Signed)
Radiation Oncology         [phone] ________________________________  Name: Natasha Graham MRN: [id number]  Date: 03/18/2020  DOB: [birth year]  Follow-Up Visit Note  CC: Hamrick, Lorin Mercy, MD  Magrinat, Virgie Dad, MD    ICD-10-CM   1. Malignant neoplasm of lower-outer quadrant of left breast of female, estrogen receptor positive ([place])  C50.512    Z17.0   2. Malignant neoplasm of nipple of left breast in female, unspecified estrogen receptor status ([place])  C50.012     Diagnosis: Stage IIIA (pT3, pN1a) Left Breast LOQ, Invasive Lobular Carcinoma with LCIS, ER+ / PR- / Her2-, Grade 2  Interval Since Last Radiation: One month, one week, and four days.  Radiation Treatment Dates: 12/26/2019 through 02/06/2020 Site Technique Total Dose (Gy) Dose per Fx (Gy) Completed Fx Beam Energies  Chest Wall, Left: CW_Lt_Bst Electron 10/10 2 5/5 6E  Sclav-LT: SCV_Lt Complex 50/50 2 25/25 6X, 15X  Sclav-LT: SCV_Lt_Bst Complex 8/8 2 4/4 6X, 15X  Chest Wall, Left: CW_Lt 3D 50/50 2 25/25 6X, 10X, 15X    Narrative:  The patient returns today for routine follow-up. Since the end of treatment, the patient has been going to physical therapy for lymphedema and acute pain/stiffness of left shoulder. She was last seen by PT on 03/06/2020.    She has completed physical therapy now and has had significant improvement in her left arm and shoulder mobility.  She denies any problems with swelling in her left arm or hand.                     On review of systems, she reports improved itching and sensitivity of her left breast. She denies difficulty with range of motion in her left arm.  ALLERGIES:  has No Known Allergies.  Meds: Current Outpatient Medications  Medication Sig Dispense Refill  . tamoxifen (NOLVADEX) 20 MG tablet Take 20 mg by mouth daily.    Marland Kitchen acetaminophen (TYLENOL) 500 MG tablet Take by mouth.    . Ascorbic Acid (VITAMIN C) 100 MG tablet Take 100 mg by mouth daily.    . Flaxseed Oil (LINSEED  OIL) OIL by Misc.(Non-Drug; Combo Route) route.    . Ginger, Zingiber officinalis, (GINGER PO) Take by mouth.    Marland Kitchen ibuprofen (ADVIL) 200 MG tablet Take by mouth.    Marland Kitchen lisinopril-hydrochlorothiazide (PRINZIDE,ZESTORETIC) 20-25 MG per tablet Take 1 tablet by mouth every evening.     . Magnesium 250 MG TABS Take by mouth.    . Multiple Vitamin (MULTIVITAMIN) capsule Take by mouth.    . Turmeric (QC TUMERIC COMPLEX) 500 MG CAPS Take by mouth.     No current facility-administered medications for this encounter.    Physical Findings: The patient is in no acute distress. Patient is alert and oriented.  height is '5\' 3"'  (1.6 m) and weight is 169 lb 8 oz (76.9 kg). Her oral temperature is 98.7 F (37.1 C). Her blood pressure is 133/72 and her pulse is 84. Her respiration is 18 and oxygen saturation is 100%.    Lungs are clear to auscultation bilaterally. Heart has regular rate and rhythm. No palpable cervical, supraclavicular, or axillary adenopathy. Abdomen soft, non-tender, normal bowel sounds. Right breast: No palpable mass, nipple discharge, or bleeding.  Left reconstructed breast: Hyperpigmentation changes noted.  Skin is well-healed at this time.  No signs of infection.  Induration thru out consistent with prior surgery and radiation effect  Lab Findings: Lab Results  Component  Value Date   WBC 6.6 11/27/2019   HGB 14.0 11/27/2019   HCT 40.5 11/27/2019   MCV 93.1 11/27/2019   PLT 338 11/27/2019    Radiographic Findings: No results found.  Impression: Stage IIIA (pT3, pN1a) Left Breast LOQ, Invasive Lobular Carcinoma with LCIS, ER+ / PR- / Her2-, Grade 2  The patient has recovered well from radiation therapy.  She is tolerating tamoxifen well.  Plan: The patient is scheduled to follow-up with Dr. Jana Hakim on 04/29/2020. She will follow-up with radiation oncology on a as needed basis.  She plans on scheduling a follow-up with her plastic surgeon in the near  future.    ____________________________________   Blair Promise, PhD, MD  This document serves as a record of services personally performed by Gery Pray, MD. It was created on his behalf by Clerance Lav, a trained medical scribe. The creation of this record is based on the scribe's personal observations and the provider's statements to them. This document has been checked and approved by the attending provider.

## 2020-04-28 NOTE — Progress Notes (Signed)
[place]  Telephone:([phone] Fax:(336) [phone]     ID: Natasha Graham DOB: [birth year]  MR#: [id number]  ZMO#:[id number]  Patient Care Team: Leonides Sake, MD as PCP - General (Family Medicine) Rolm Bookbinder, MD as Consulting Physician (General Surgery) Chares Slaymaker, Virgie Dad, MD as Consulting Physician (Oncology) Mauro Kaufmann, RN as Registered Nurse Rockwell Germany, RN as Registered Nurse Dillingham, Loel Lofty, DO as Attending Physician (Plastic Surgery) Gery Pray, MD as Consulting Physician (Radiation Oncology) OTHER MD: Sherrie Mustache MD   CHIEF COMPLAINT: new estrogen receptor positive left breast cancer; remote noninvasive right breast cancer (s/p bilateral mastectomies)  CURRENT TREATMENT: Tamoxifen; completion of reconstruction pending   INTERVAL HISTORY: Natasha Graham returns today for follow up of her newly diagnosed invasive left breast cancer.  Since her last visit, she was referred back to Dr. Sondra Come in radiation oncology on 12/07/2019. She received treatment from 12/26/2019 through 02/06/2020.  She did well at first but found the last 2 weeks quite painful.  Her skin was raw.  She did not understand why she needed to have such an extensive area and why it could not all be "targeted".  She started tamoxifen 02/29/2020.  She has been watching for weight gain and 4.  Since she has had neither.  She is concerned regarding blood clots and that was discussed today.  She has been having mild hot flashes mostly during the day.  They do not cause her to flush or sweat.  She is obtaining the drug at no cost.     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  She is not otherwise exercising on a regular basis.  She does take care of the grandchildren.  She did not have swallowing issues related to the neck radiation.  She has not had vaginal discharge issues from the tamoxifen.  A detailed review of  systems today was otherwise noncontributory  LEFT BREAST CANCER HISTORY: From the original intake note:  She had routine screening left mammogram on 06/14/2019 showing a developing asymmetry. She presented for left diagnostic mammogram and left breast ultrasound on 07/07/2019. Physical exam that day showed a palpable, firm, superficial mass in the left breast at 6 o'clock along the site of reduction mammoplasty scar (performed in 2015 with right mastectomy). Scans showed: breast density category B; 2.1 cm superficial mass involving the skin in the left breast at 6 o'clock; no enlarged adenopathy in the left axilla.   Accordingly on 07/12/2019 she proceeded to biopsy of the left breast area in question. The pathology from this procedure ([id number]) showed: invasive lobular carcinoma, grade 1, with perineural invasion present. Prognostic indicators significant for: estrogen receptor, 60% positive with moderate staining intensity and progesterone receptor, 0% negative. Proliferation marker Ki67 at <1%. HER2 equvocal by immunohistochemistry (2+), but negative by fluorescent in situ hybridization with a signals ratio 1.18 and number per cell 1.95.  She opted to proceed with left mastectomy on 09/27/2019 under Dr. Donne Hazel with immediate reconstruction under Dr. Marla Roe. Pathology from the procedure [id number]) revealed: invasive lobular carcinoma, grade 2, 6.2 cm, focally involving anterior margin and involving skin dermis without involvement of epidermis; lobular carcinoma in situ with pagetoid spread; small focus of low grade ductal carcinoma in situ, 0.2 cm.  Two lymph nodes were biopsied. One showed metastatic lobular carcinoma with focus measuring 2.1 cm with evidence of extranodal extension. The other lymph node also showed metastatic lobular carcinoma with no lymphoid tissue  present.  Biopsy of the nipple was also taken at that time and showed no evidence of invasive carcinoma.  Mammaprint  was performed on the final surgical sample. This returned showing low risk.  RIGHT BREAST CANCER HISTORY: Natasha Graham has a history of right breast biopsy in late 2007 showing atypical ductal hyperplasia in the upper outer right breast.. Breast MRI 07/13/2006 showed postbiopsy changes in the upper right breast, and some right breast cysts. There were no solid masses however or areas of abnormal enhancement. There were no enlarged lymph nodes. Incidental finding of liver cysts was made. On 07/30/2006 she underwent needle localization of the calcifications in the outer right breast which also showed ([id number]) atypical ductal hyperplasia, with no malignancy.   On 10/21/2012 routine digital bilateral screening mammography at the breast Center showed breast density to be category C. In the right breast there were calcifications and architectural distortion to go with skin retraction. Additional views obtained 11/17/2012 showed extensive but stable punctate microcalcifications in the upper outer right breast. Many of these were superficial and not amenable to stereotactic biopsy. MRI was suggested but the patient was unable to undergo that test and therefore six-month mammography was obtained 05/15/2013. This showed the calcifications in the outer right breast to have been stable. Because of their extents and because of their superficial location they could not be all sampled all removed with biopsy. MRI was again discussed, but the patient has Harrington rods in place and also felt the cost of MRI was prohibitive.  On 11/13/2013 further six-month follow-up showed a grouping of intermediate microcalcifications in the lateral right breast and these were biopsied 11/20/2013. The result of that procedure ([id number]) showed atypical ductal hyperplasia. Accordingly on 02/23/2014 the patient underwent right lumpectomy, with the pathology ([id number]) showing ductal carcinoma in situ, grade 2, measuring 0.7 cm.  Estrogen receptor was 100% positive. Progesterone receptor was 96% positive. Both showed strong staining intensity. Margins were clear but the closest margin anteriorly was at 1 mm.  On 03/07/2014 the patient underwent postoperative mammography which showed post lumpectomy changes but also calcifications extending from the lumpectomy site anteriorly towards the nipple. On 03/12/2014 the patient underwent bilateral breast MRI. Breast composition here was described as category B. In the right breast there was a 4.8 cm fluidlike excisional cavity in the lower outer right breast. Adjacent to this was a focus of non-masslike enhancement measuring 1.9 cm. This is an area associated with the scar from the excision in 2007. There was also non-masslike enhancement inferior to the biopsy cavity measuring 3.8 cm. There were no abnormal lymph nodes and no masses or abnormal enhancement in the left breast. Specifically the left breast lower outer quadrant was unchanged from 2007 MRI.  Overall it was felt the linear non-masslike enhancement extending almost 4 cm inferior to the excisional cavity was sufficiently suspicious to warrant biopsy, and this was performed 03/29/2014. It again showed ductal carcinoma in situ, grade 1, estrogen receptor and progesterone receptor both 100% positive with strong staining intensity.  At this point the patient met with surgery and opted for definitive right mastectomy with immediate reconstruction. On 05/16/2014 she underwent simple mastectomy with sentinel lymph node sampling. The pathology ([id number]) showed atypical ductal hyperplasia but no evidence of neoplasia. All 5 sentinel lymph nodes were clear. The patient had immediate right breast reconstruction with tissue expander and dermal matrix placement.  She is scheduled for definitive implant placement 08/08/2014.  Her subsequent history is as detailed below  PAST MEDICAL HISTORY: Past Medical History:  Diagnosis Date  .  Cancer [facility])    breast cancer  . Hypertension   . PONV (postoperative nausea and vomiting)   . Scoliosis   . Wears glasses     PAST SURGICAL HISTORY: Past Surgical History:  Procedure Laterality Date  . AUGMENTATION MAMMAPLASTY Right    2015 post mastectomy  . AXILLARY LYMPH NODE DISSECTION Left 11/13/2019   Procedure: LEFT AXILLARY LYMPH NODE DISSECTION;  Surgeon: Rolm Bookbinder, MD;  Location: [place];  Service: General;  Laterality: Left;  . BREAST RECONSTRUCTION WITH PLACEMENT OF TISSUE EXPANDER AND FLEX HD (ACELLULAR HYDRATED DERMIS) Right 05/16/2014   Procedure: IMMEDIATE RIGHT BREAST RECONSTRUCTION WITH PLACEMENT OF TISSUE EXPANDER AND FLEX HD (ACELLULAR HYDRATED DERMIS);  Surgeon: Theodoro Kos, DO;  Location: [place];  Service: Plastics;  Laterality: Right;  . BREAST RECONSTRUCTION WITH PLACEMENT OF TISSUE EXPANDER AND FLEX HD (ACELLULAR HYDRATED DERMIS) Left 09/27/2019   Procedure: LEFT BREAST RECONSTRUCTION WITH PLACEMENT OF TISSUE EXPANDER AND FLEX HD (ACELLULAR HYDRATED DERMIS);  Surgeon: Wallace Going, DO;  Location: [place];  Service: Plastics;  Laterality: Left;  . BREAST SURGERY     right breast excisional biopsy  . DILATION AND CURETTAGE OF UTERUS    . HYSTEROSCOPY WITH D & C N/A 09/20/2015   Procedure: DILATATION AND CURETTAGE /HYSTEROSCOPY;  Surgeon: Brien Few, MD;  Location: [facility];  Service: Gynecology;  Laterality: N/A;  . LIPOSUCTION Bilateral 08/08/2014   Procedure: LIPO SUCTION ;  Surgeon: Theodoro Kos, DO;  Location: [place];  Service: Plastics;  Laterality: Bilateral;  . MASTECTOMY Right 05/16/2014   placement of acellular dermal matrix & tissue expanders   . MASTOPEXY Left 08/08/2014   Procedure:  MASTOPEXY FOR SYMMETRY;  Surgeon: Theodoro Kos, DO;  Location: [place];  Service: Plastics;  Laterality: Left;  . NIPPLE SPARING MASTECTOMY WITH SENTINEL LYMPH NODE BIOPSY Left  09/27/2019   Procedure: LEFT NIPPLE SPARING MASTECTOMY WITH LEFT AXILLARY SENTINEL LYMPH NODE BIOPSY;  Surgeon: Rolm Bookbinder, MD;  Location: [place];  Service: General;  Laterality: Left;  . REDUCTION MAMMAPLASTY Left    2015  . REMOVAL OF TISSUE EXPANDER AND PLACEMENT OF IMPLANT Right 08/08/2014   Procedure: REMOVAL OF RIGHT  TISSUE EXPANDERS WITH PLACEMENT OF RIGHT BREAST IMPLANTS WITH LIPO SUCTION ;  Surgeon: Theodoro Kos, DO;  Location: [place];  Service: Plastics;  Laterality: Right;  . SCAR REVISION Left 11/13/2019   Procedure: EXCISION OF LEFT MASTECTOMY SKIN;  Surgeon: Rolm Bookbinder, MD;  Location: [place];  Service: General;  Laterality: Left;  . scoliosis  1972   harrington rods-age 47  . TONSILLECTOMY      FAMILY HISTORY Family History  Problem Relation Age of Onset  . Cancer Father        liver  . Breast cancer Paternal Aunt    there is significant cancer history on the paternal side, her father dying at the age of 65 with what the patient tells me was metastatic head and neck cancer (but also involving the colon and liver). One of the patient's father's brothers had stomach cancer, and another had a brain tumor, and 2 of the patient's father's sisters had breast cancer, 1 diagnosed at age 72, the other at age 89.  On the mother's side there is a history of non-Hodgkin's lymphoma at age 31. There is no history of ovarian cancer in the  family   GYNECOLOGIC HISTORY:  Patient's last menstrual period was 10/16/2013.  menarche age 54, first live birth age 59. The patient is GX P3. She stopped having periods in October 2014. She did not take hormone replacement. She did take birth control pills for approximately 1 year remotely, with no complications.   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 She works as an Web designer. Son Truman Hayward  drives a truck for the family business. Daughter Thayer Headings is a Forensic psychologist and currently works as an IT trainer in [place].  Daughter Roselyn Reef is studying history and English at [employer]. The patient has 2 granddaughters aged 46 and 1 as of May 2021. She attends [employer]    ADVANCED DIRECTIVES: In the absence of any documents to the contrary the patient's husband is her healthcare power of attorney   HEALTH MAINTENANCE: Social History   Tobacco Use  . Smoking status: Never Smoker  . Smokeless tobacco: Never Used  Vaping Use  . Vaping Use: Never used  Substance Use Topics  . Alcohol use: No  . Drug use: No     Colonoscopy:  2013  PAP: March 2015  Bone density:  Lipid panel:  No Known Allergies  Current Outpatient Medications  Medication Sig Dispense Refill  . acetaminophen (TYLENOL) 500 MG tablet Take by mouth.    . Ascorbic Acid (VITAMIN C) 100 MG tablet Take 100 mg by mouth daily.    . Flaxseed Oil (LINSEED OIL) OIL by Misc.(Non-Drug; Combo Route) route.    . Ginger, Zingiber officinalis, (GINGER PO) Take by mouth.    Marland Kitchen ibuprofen (ADVIL) 200 MG tablet Take by mouth.    Marland Kitchen lisinopril-hydrochlorothiazide (PRINZIDE,ZESTORETIC) 20-25 MG per tablet Take 1 tablet by mouth every evening.     . Magnesium 250 MG TABS Take by mouth.    . Multiple Vitamin (MULTIVITAMIN) capsule Take by mouth.    . tamoxifen (NOLVADEX) 20 MG tablet Take 20 mg by mouth daily.    . Turmeric (QC TUMERIC COMPLEX) 500 MG CAPS Take by mouth.     No current facility-administered medications for this visit.    OBJECTIVE: White woman who appears younger than stated age 32:   04/29/20 0847  BP: (!) 143/75  Pulse: (!) 101  Resp: 18  Temp: 97.8 F (36.6 C)  SpO2: 100%     Body mass index is 29.9 kg/m.    ECOG FS:1 - Symptomatic but completely ambulatory  Sclerae unicteric, EOMs intact Wearing a mask No cervical or supraclavicular adenopathy Lungs no rales or  rhonchi Heart regular rate and rhythm Abd soft, nontender, positive bowel sounds MSK no focal spinal tenderness, no upper extremity lymphedema Neuro: nonfocal, well oriented, appropriate affect Breasts: Status post bilateral mastectomies.  On the right there is a saline implant.  On the left there is an expander.  There is no evidence of chest wall recurrence.  Both axillae are benign.   LAB RESULTS:  CMP     Component Value Date/Time   NA 143 11/27/2019 1112   NA 143 07/30/2014 1603   K 3.6 11/27/2019 1112   K 3.5 07/30/2014 1603   CL 103 11/27/2019 1112   CO2 30 11/27/2019 1112   CO2 30 (H) 07/30/2014 1603   GLUCOSE 102 (H) 11/27/2019 1112   GLUCOSE 93 07/30/2014 1603   BUN 10 11/27/2019 1112   BUN 10.5 07/30/2014 1603   CREATININE  0.75 11/27/2019 1112   CREATININE 0.9 07/30/2014 1603   CALCIUM 9.1 11/27/2019 1112   CALCIUM 9.5 07/30/2014 1603   PROT 7.1 11/27/2019 1112   PROT 7.0 07/30/2014 1603   ALBUMIN 3.9 11/27/2019 1112   ALBUMIN 4.0 07/30/2014 1603   AST 18 11/27/2019 1112   AST 23 07/30/2014 1603   ALT 26 11/27/2019 1112   ALT 23 07/30/2014 1603   ALKPHOS 96 11/27/2019 1112   ALKPHOS 78 07/30/2014 1603   BILITOT 0.4 11/27/2019 1112   BILITOT 0.29 07/30/2014 1603   GFRNONAA >60 11/27/2019 1112   GFRAA >60 11/27/2019 1112    INo results found for: SPEP, UPEP  Lab Results  Component Value Date   WBC 3.2 (L) 04/29/2020   NEUTROABS 2.0 04/29/2020   HGB 13.0 04/29/2020   HCT 37.1 04/29/2020   MCV 93.5 04/29/2020   PLT 263 04/29/2020      Chemistry      Component Value Date/Time   NA 143 11/27/2019 1112   NA 143 07/30/2014 1603   K 3.6 11/27/2019 1112   K 3.5 07/30/2014 1603   CL 103 11/27/2019 1112   CO2 30 11/27/2019 1112   CO2 30 (H) 07/30/2014 1603   BUN 10 11/27/2019 1112   BUN 10.5 07/30/2014 1603   CREATININE 0.75 11/27/2019 1112   CREATININE 0.9 07/30/2014 1603      Component Value Date/Time   CALCIUM 9.1 11/27/2019 1112   CALCIUM  9.5 07/30/2014 1603   ALKPHOS 96 11/27/2019 1112   ALKPHOS 78 07/30/2014 1603   AST 18 11/27/2019 1112   AST 23 07/30/2014 1603   ALT 26 11/27/2019 1112   ALT 23 07/30/2014 1603   BILITOT 0.4 11/27/2019 1112   BILITOT 0.29 07/30/2014 1603      No results found for: LABCA2  No components found for: LABCA125  No results for input(s): INR in the last 168 hours.  Urinalysis No results found for: COLORURINE, APPEARANCEUR, LABSPEC, PHURINE, GLUCOSEU, HGBUR, BILIRUBINUR, KETONESUR, PROTEINUR, UROBILINOGEN, NITRITE, LEUKOCYTESUR   STUDIES: No results found.     ASSESSMENT: 59 y.o. [place], [place] woman  RIGHT BREAST CANCER (1) status post right breast upper-outer quadrant lumpectomy 07/30/2006 showing atypical ductal hyperplasia.  (2) right breast upper outer quadrant biopsy 11/20/2013 showed atypical ductal hyperplasia   (3) right lumpectomy 02/26/2014 showed ductal carcinoma in situ measuring 0.7 cm, estrogen receptor 100% positive, progesterone receptor 96% positive, with 1 mm margins  (4) right breast lower inner quadrant biopsy 03/29/2014 showed ductal carcinoma in situ, 100% estrogen receptor positive, 100% progesterone receptor positive,  (5) status post right mastectomy with sentinel lymph node sampling showing only atypical ductal hyperplasia,  (a) 5 sentinel lymph nodes removed, all clear  (b) status post right saline implant reconstruction  (6) the patient opted against prophylactic antiestrogens   LEFT BREAST CANCER (7) status post left breast lower outer quadrant biopsy 07/12/2019 for a clinical T1N0, stage IA invasive lobular carcinoma, grade 1, with evidence of perineural invasion, estrogen receptor moderately positive, progesterone receptor negative, HER-2 not amplified, with an MIB-1 of less than 1%  (8) status post left nipple sparing mastectomy 09/27/2019 for a pT3 pN1, stage IIIA invasive lobular carcinoma, grade 2, with a negative nipple biopsy and a focally  positive anterior margin  (a) 1 sentinel lymph node had a 2.1 cm tumor deposit; a second possible lymph node had no residual lymph node tissue  (b) immediate expander placement  (c) status post left axillary lymph node dissection  and margin excision 11/13/2019, no residual disease   (i) 7 of 7 left axillary lymph nodes negative for carcinoma.    (9) MammaPrint "low risk" predicts no significant benefit from chemotherapy.   (10) adjuvant radiation 12/26/2019 - 02/06/2020 Site Technique Total Dose (Gy) Dose per Fx (Gy) Completed Fx Beam Energies  Chest Wall, Left: CW_Lt_Bst Electron 10/10 2 5/5 6E  Sclav-LT: SCV_Lt Complex 50/50 2 25/25 6X, 15X  Sclav-LT: SCV_Lt_Bst Complex 8/8 2 4/4 6X, 15X  Chest Wall, Left: CW_Lt 3D 50/50 2 25/25 6X, 10X, 15X   (11) staging studies:   (a) chest, abdomen and pelvic CT with contrast shows mild hepatic steatosis, small uterine fibroids, 0.3 cm left upper lobe nodule, but no evidence of metastatic disease.  (b) F18 estradiol PET scan shows a chain of very small lymph nodes from the subpectoralis nodal station to the left supraclavicular nodal station positive for ER accumulation with SUVs in the 4 range (versus mediastinal blood pool 1.4). no evidence of lung, bone, or liver involvement.   (12) tamoxifen started 02/29/2020  (13) genetics testing pending  (14) reconstruction in process   PLAN: Natasha Graham found the radiation a little more difficult than she expected.  We discussed the reason why she had such an extensive field and the need for a boost.  The bottom line is we hope she will not have a local recurrence of her lobular breast cancer and she understands lobular so very difficult to detect.  Erring on the side of too much is the right thing to do with these cancers.  She is very concerned to complete her reconstruction this year for insurance reasons.  I have sent Dr. Tedra Coupe him a note.  The patient is now about 3 months out from radiation and hopefully  she will be able to get her expansion and a saline implant exchange before the end of the year.  We discussed genetics.  Natasha Graham qualifies and is interested.  I have let our genetics counselors know so they can set her up for a virtual visit and labs.  Natasha Graham is tolerating tamoxifen generally quite well.  The plan will be to continue that a total of 10 years.  I have encouraged her to exercise regularly.  She does have a 0.3 cm left upper lung nodule that requires follow-up and I am setting her up for CT scans of the neck and chest in December.  That will be her baseline.  She understands we generally do not do scans in the absence of specific symptoms so I do not anticipate further scans after these assuming these are benign  She knows to call for any other issue that may develop before the next visit.  Total encounter time 35 minutes.Sarajane Jews C. Johnthomas Lader, MD 04/29/2020 8:53 AM  Oncology and Hematology [facility] [place], [place] [address] Tel. [phone]  Joylene Igo [phone]   I, Wilburn Mylar, am acting as scribe for Dr. Sarajane Jews C. Priscilla Finklea.  I, Lurline Del MD, have reviewed the above documentation for accuracy and completeness, and I agree with the above.   *Total Encounter Time as defined by the Centers for Medicare and Medicaid Services includes, in addition to the face-to-face time of a patient visit (documented in the note above) non-face-to-face time: obtaining and reviewing outside history, ordering and reviewing medications, tests or procedures, care coordination (communications with other health care professionals or caregivers) and documentation in the medical record.

## 2020-04-29 ENCOUNTER — Inpatient Hospital Stay: Payer: BC Managed Care – PPO | Attending: Oncology | Admitting: Oncology

## 2020-04-29 ENCOUNTER — Other Ambulatory Visit: Payer: Self-pay

## 2020-04-29 ENCOUNTER — Inpatient Hospital Stay: Payer: BC Managed Care – PPO

## 2020-04-29 VITALS — BP 143/75 | HR 101 | Temp 97.8°F | Resp 18 | Ht 63.0 in | Wt 168.8 lb

## 2020-04-29 DIAGNOSIS — C50512 Malignant neoplasm of lower-outer quadrant of left female breast: Secondary | ICD-10-CM | POA: Insufficient documentation

## 2020-04-29 DIAGNOSIS — C50912 Malignant neoplasm of unspecified site of left female breast: Secondary | ICD-10-CM

## 2020-04-29 DIAGNOSIS — Z9013 Acquired absence of bilateral breasts and nipples: Secondary | ICD-10-CM | POA: Insufficient documentation

## 2020-04-29 DIAGNOSIS — D0511 Intraductal carcinoma in situ of right breast: Secondary | ICD-10-CM | POA: Diagnosis not present

## 2020-04-29 DIAGNOSIS — R911 Solitary pulmonary nodule: Secondary | ICD-10-CM | POA: Diagnosis not present

## 2020-04-29 DIAGNOSIS — Z79899 Other long term (current) drug therapy: Secondary | ICD-10-CM | POA: Insufficient documentation

## 2020-04-29 DIAGNOSIS — Z170 Estrogen receptor positive status [ER+]: Secondary | ICD-10-CM | POA: Insufficient documentation

## 2020-04-29 DIAGNOSIS — Z79810 Long term (current) use of selective estrogen receptor modulators (SERMs): Secondary | ICD-10-CM | POA: Insufficient documentation

## 2020-04-29 LAB — CBC WITH DIFFERENTIAL/PLATELET
Abs Immature Granulocytes: 0.02 10*3/uL (ref 0.00–0.07)
Basophils Absolute: 0 10*3/uL (ref 0.0–0.1)
Basophils Relative: 1 %
Eosinophils Absolute: 0.1 10*3/uL (ref 0.0–0.5)
Eosinophils Relative: 2 %
HCT: 37.1 % (ref 36.0–46.0)
Hemoglobin: 13 g/dL (ref 12.0–15.0)
Immature Granulocytes: 1 %
Lymphocytes Relative: 21 %
Lymphs Abs: 0.7 10*3/uL (ref 0.7–4.0)
MCH: 32.7 pg (ref 26.0–34.0)
MCHC: 35 g/dL (ref 30.0–36.0)
MCV: 93.5 fL (ref 80.0–100.0)
Monocytes Absolute: 0.4 10*3/uL (ref 0.1–1.0)
Monocytes Relative: 13 %
Neutro Abs: 2 10*3/uL (ref 1.7–7.7)
Neutrophils Relative %: 62 %
Platelets: 263 10*3/uL (ref 150–400)
RBC: 3.97 MIL/uL (ref 3.87–5.11)
RDW: 12.1 % (ref 11.5–15.5)
WBC: 3.2 10*3/uL — ABNORMAL LOW (ref 4.0–10.5)
nRBC: 0 % (ref 0.0–0.2)

## 2020-04-29 LAB — COMPREHENSIVE METABOLIC PANEL
ALT: 23 U/L (ref 0–44)
AST: 17 U/L (ref 15–41)
Albumin: 3.7 g/dL (ref 3.5–5.0)
Alkaline Phosphatase: 85 U/L (ref 38–126)
Anion gap: 9 (ref 5–15)
BUN: 9 mg/dL (ref 6–20)
CO2: 28 mmol/L (ref 22–32)
Calcium: 9.7 mg/dL (ref 8.9–10.3)
Chloride: 103 mmol/L (ref 98–111)
Creatinine, Ser: 0.76 mg/dL (ref 0.44–1.00)
GFR calc Af Amer: >60 mL/min (ref >60)
GFR calc non Af Amer: >60 mL/min (ref >60)
Glucose, Bld: 111 mg/dL — ABNORMAL HIGH (ref 70–99)
Potassium: 3.4 mmol/L — ABNORMAL LOW (ref 3.5–5.1)
Sodium: 140 mmol/L (ref 135–145)
Total Bilirubin: 0.4 mg/dL (ref 0.3–1.2)
Total Protein: 6.8 g/dL (ref 6.5–8.1)

## 2020-04-30 ENCOUNTER — Telehealth: Payer: Self-pay | Admitting: Oncology

## 2020-04-30 NOTE — Telephone Encounter (Signed)
Scheduled appts per 8/30 los. Pt confirmed appt date and times.

## 2020-05-30 ENCOUNTER — Other Ambulatory Visit: Payer: BC Managed Care – PPO

## 2020-05-30 ENCOUNTER — Encounter: Payer: BC Managed Care – PPO | Admitting: Genetic Counselor

## 2020-06-05 DIAGNOSIS — C50912 Malignant neoplasm of unspecified site of left female breast: Secondary | ICD-10-CM | POA: Diagnosis not present

## 2020-06-05 DIAGNOSIS — I10 Essential (primary) hypertension: Secondary | ICD-10-CM | POA: Diagnosis not present

## 2020-06-05 DIAGNOSIS — Z6829 Body mass index (BMI) 29.0-29.9, adult: Secondary | ICD-10-CM | POA: Diagnosis not present

## 2020-06-05 DIAGNOSIS — Z1331 Encounter for screening for depression: Secondary | ICD-10-CM | POA: Diagnosis not present

## 2020-06-10 ENCOUNTER — Inpatient Hospital Stay: Payer: BC Managed Care – PPO | Attending: Oncology | Admitting: Genetic Counselor

## 2020-06-10 ENCOUNTER — Inpatient Hospital Stay: Payer: BC Managed Care – PPO

## 2020-06-10 ENCOUNTER — Other Ambulatory Visit: Payer: Self-pay

## 2020-06-10 DIAGNOSIS — D0511 Intraductal carcinoma in situ of right breast: Secondary | ICD-10-CM | POA: Diagnosis not present

## 2020-06-10 DIAGNOSIS — Z808 Family history of malignant neoplasm of other organs or systems: Secondary | ICD-10-CM

## 2020-06-10 DIAGNOSIS — Z800 Family history of malignant neoplasm of digestive organs: Secondary | ICD-10-CM

## 2020-06-10 DIAGNOSIS — Z803 Family history of malignant neoplasm of breast: Secondary | ICD-10-CM

## 2020-06-10 DIAGNOSIS — C50912 Malignant neoplasm of unspecified site of left female breast: Secondary | ICD-10-CM

## 2020-06-11 ENCOUNTER — Encounter: Payer: Self-pay | Admitting: Plastic Surgery

## 2020-06-11 ENCOUNTER — Encounter: Payer: Self-pay | Admitting: Genetic Counselor

## 2020-06-11 ENCOUNTER — Ambulatory Visit (INDEPENDENT_AMBULATORY_CARE_PROVIDER_SITE_OTHER): Payer: BC Managed Care – PPO | Admitting: Plastic Surgery

## 2020-06-11 VITALS — BP 158/87 | HR 82 | Temp 98.2°F

## 2020-06-11 DIAGNOSIS — Z808 Family history of malignant neoplasm of other organs or systems: Secondary | ICD-10-CM | POA: Insufficient documentation

## 2020-06-11 DIAGNOSIS — Z9012 Acquired absence of left breast and nipple: Secondary | ICD-10-CM | POA: Diagnosis not present

## 2020-06-11 DIAGNOSIS — Z9011 Acquired absence of right breast and nipple: Secondary | ICD-10-CM

## 2020-06-11 DIAGNOSIS — Z9889 Other specified postprocedural states: Secondary | ICD-10-CM

## 2020-06-11 DIAGNOSIS — Z803 Family history of malignant neoplasm of breast: Secondary | ICD-10-CM | POA: Insufficient documentation

## 2020-06-11 DIAGNOSIS — Z800 Family history of malignant neoplasm of digestive organs: Secondary | ICD-10-CM | POA: Insufficient documentation

## 2020-06-11 NOTE — Progress Notes (Signed)
   Subjective:    Patient ID: Natasha Graham, female    DOB: 20-Jan-1961, 59 y.o.   MRN: [id number]  The patient is a 59 year old white female here for a follow-up from her breast reconstruction.  The patient had a right mastectomy with reconstruction in September 2015 followed by placement of a saline implant in December 2015.  She had a left mastopexy for symmetry at the same time.  She has a Product manager smooth round high-profile saline implant 600 cc infused of the right breast.  In January 2001 she was found to have left breast cancer and underwent mastectomy with immediate reconstruction.  She had a Mentor 455 expander placed with Flex HD 6 x 16 cm.  She is 5 feet 4 inches tall and weighs 165 pounds.  The patient finished radiation in June.  Her results are pretty standard with skin discoloration but no wounds noted.  She is still happy with the right side.  She understands that we are not sure how she will progress with the left breast due to the radiation.    Review of Systems  Constitutional: Negative.   HENT: Negative.   Eyes: Negative.   Respiratory: Negative.   Cardiovascular: Negative.   Gastrointestinal: Negative.   Endocrine: Negative.   Genitourinary: Negative.   Musculoskeletal: Negative.   Hematological: Negative.   Psychiatric/Behavioral: Negative.        Objective:   Physical Exam Vitals and nursing note reviewed.  Constitutional:      Appearance: Normal appearance.  HENT:     Head: Normocephalic and atraumatic.  Cardiovascular:     Rate and Rhythm: Normal rate.     Pulses: Normal pulses.  Pulmonary:     Effort: Pulmonary effort is normal.  Abdominal:     General: Abdomen is flat. There is no distension.     Tenderness: There is no abdominal tenderness.  Skin:    General: Skin is warm.     Capillary Refill: Capillary refill takes less than 2 seconds.  Neurological:     General: No focal deficit present.     Mental Status: She is alert and oriented to person,  place, and time.  Psychiatric:        Mood and Affect: Mood normal.        Behavior: Behavior normal.        Thought Content: Thought content normal.         Assessment & Plan:     ICD-10-CM   1. Acquired absence of right breast  Z90.11   2. Status post right breast reconstruction  Z98.890   3. S/P mastectomy, left  Z90.12     We placed injectable saline in the Expander using a sterile technique: Right: 600 cc saline implant Left: 50 cc for a total of 140 / 455 cc  We will continue to expand and see how far we can get.  I like to see her back in 2 weeks.  Pictures were obtained of the patient and placed in the chart with the patient's or guardian's permission.

## 2020-06-11 NOTE — Progress Notes (Signed)
REFERRING PROVIDER: Chauncey Cruel, MD [address] [place],  [place] [address]  PRIMARY PROVIDER:  Leonides Sake, MD  PRIMARY REASON FOR VISIT:  1. Breast neoplasm, Tis (DCIS), right   2. Family history of colon cancer   3. Family history of breast cancer   4. Family history of brain cancer   5. Family history of stomach cancer      HISTORY OF PRESENT ILLNESS:   Natasha Graham, a 59 y.o. female, was seen for a [place] cancer genetics consultation at the request of Dr. Jana Hakim due to a personal and family history of cancer.  Natasha Graham presents to clinic today to discuss the possibility of a hereditary predisposition to cancer, genetic testing, and to further clarify her future cancer risks, as well as potential cancer risks for family members.   In 2015, at the age of 69, Natasha Graham was diagnosed with DCIS of the right breast. The treatment plan lumpectomy and radiation.  In 2021, at the age of 4, Natasha Graham was diagnosed with  Cancer of the left breast.  The tumor is ER+/PR+/Her2-.    CANCER HISTORY:  Oncology History   No history exists.     RISK FACTORS:  Menarche was at age 89.  First live birth at age 40.  OCP use for approximately 1 years.  Ovaries intact: yes.  Hysterectomy: no.  Menopausal status: postmenopausal.  HRT use: 0 years. Colonoscopy: yes; normal. Mammogram within the last year: yes. Number of breast biopsies: 2. Up to date with pelvic exams: yes. Any excessive radiation exposure in the past: no  Past Medical History:  Diagnosis Date  . Cancer [facility])    breast cancer  . Family history of brain cancer   . Family history of breast cancer   . Family history of colon cancer   . Family history of stomach cancer   . Hypertension   . PONV (postoperative nausea and vomiting)   . Scoliosis   . Wears glasses     Past Surgical History:  Procedure Laterality Date  . AUGMENTATION MAMMAPLASTY Right    2015 post mastectomy  . AXILLARY  LYMPH NODE DISSECTION Left 11/13/2019   Procedure: LEFT AXILLARY LYMPH NODE DISSECTION;  Surgeon: Rolm Bookbinder, MD;  Location: [place];  Service: General;  Laterality: Left;  . BREAST RECONSTRUCTION WITH PLACEMENT OF TISSUE EXPANDER AND FLEX HD (ACELLULAR HYDRATED DERMIS) Right 05/16/2014   Procedure: IMMEDIATE RIGHT BREAST RECONSTRUCTION WITH PLACEMENT OF TISSUE EXPANDER AND FLEX HD (ACELLULAR HYDRATED DERMIS);  Surgeon: Theodoro Kos, DO;  Location: [place];  Service: Plastics;  Laterality: Right;  . BREAST RECONSTRUCTION WITH PLACEMENT OF TISSUE EXPANDER AND FLEX HD (ACELLULAR HYDRATED DERMIS) Left 09/27/2019   Procedure: LEFT BREAST RECONSTRUCTION WITH PLACEMENT OF TISSUE EXPANDER AND FLEX HD (ACELLULAR HYDRATED DERMIS);  Surgeon: Wallace Going, DO;  Location: [place];  Service: Plastics;  Laterality: Left;  . BREAST SURGERY     right breast excisional biopsy  . DILATION AND CURETTAGE OF UTERUS    . HYSTEROSCOPY WITH D & C N/A 09/20/2015   Procedure: DILATATION AND CURETTAGE /HYSTEROSCOPY;  Surgeon: Brien Few, MD;  Location: [facility];  Service: Gynecology;  Laterality: N/A;  . LIPOSUCTION Bilateral 08/08/2014   Procedure: LIPO SUCTION ;  Surgeon: Theodoro Kos, DO;  Location: [place];  Service: Plastics;  Laterality: Bilateral;  . MASTECTOMY Right 05/16/2014   placement of acellular dermal matrix & tissue expanders   .  MASTOPEXY Left 08/08/2014   Procedure:  MASTOPEXY FOR SYMMETRY;  Surgeon: Theodoro Kos, DO;  Location: [place];  Service: Plastics;  Laterality: Left;  . NIPPLE SPARING MASTECTOMY WITH SENTINEL LYMPH NODE BIOPSY Left 09/27/2019   Procedure: LEFT NIPPLE SPARING MASTECTOMY WITH LEFT AXILLARY SENTINEL LYMPH NODE BIOPSY;  Surgeon: Rolm Bookbinder, MD;  Location: [place];  Service: General;  Laterality: Left;  . REDUCTION MAMMAPLASTY Left    2015  . REMOVAL OF TISSUE EXPANDER AND  PLACEMENT OF IMPLANT Right 08/08/2014   Procedure: REMOVAL OF RIGHT  TISSUE EXPANDERS WITH PLACEMENT OF RIGHT BREAST IMPLANTS WITH LIPO SUCTION ;  Surgeon: Theodoro Kos, DO;  Location: [place];  Service: Plastics;  Laterality: Right;  . SCAR REVISION Left 11/13/2019   Procedure: EXCISION OF LEFT MASTECTOMY SKIN;  Surgeon: Rolm Bookbinder, MD;  Location: [place];  Service: General;  Laterality: Left;  . scoliosis  1972   harrington rods-age 56  . TONSILLECTOMY      Social History   Socioeconomic History  . Marital status: Married    Spouse name: Not on file  . Number of children: Not on file  . Years of education: Not on file  . Highest education level: Not on file  Occupational History  . Not on file  Tobacco Use  . Smoking status: Never Smoker  . Smokeless tobacco: Never Used  Vaping Use  . Vaping Use: Never used  Substance and Sexual Activity  . Alcohol use: No  . Drug use: No  . Sexual activity: Not on file  Other Topics Concern  . Not on file  Social History Narrative  . Not on file   Social Determinants of Health   Financial Resource Strain:   . Difficulty of Paying Living Expenses: Not on file  Food Insecurity:   . Worried About Charity fundraiser in the Last Year: Not on file  . Ran Out of Food in the Last Year: Not on file  Transportation Needs:   . Lack of Transportation (Medical): Not on file  . Lack of Transportation (Non-Medical): Not on file  Physical Activity:   . Days of Exercise per Week: Not on file  . Minutes of Exercise per Session: Not on file  Stress:   . Feeling of Stress : Not on file  Social Connections:   . Frequency of Communication with Friends and Family: Not on file  . Frequency of Social Gatherings with Friends and Family: Not on file  . Attends Religious Services: Not on file  . Active Member of Clubs or Organizations: Not on file  . Attends Archivist Meetings: Not on file  . Marital  Status: Not on file     FAMILY HISTORY:  We obtained a detailed, 4-generation family history.  Significant diagnoses are listed below: Family History  Problem Relation Age of Onset  . Heart disease Mother   . Cancer Father        liver  . Colon cancer Father 61  . Head & neck cancer Father        oral cancer - dx in 85s-50s  . Breast cancer Paternal Aunt        dx <50  . Brain cancer Brother        possible meningioma  . Cancer Maternal Aunt        liver/lung cancer  . Stomach cancer Paternal Uncle   . Heart disease Maternal Grandmother   .  Lymphoma Maternal Grandfather        NHL  . Heart attack Paternal Grandfather   . Cancer Paternal Uncle        NOS    The patient hs one son and two daughters who are cancer free. She ha two brothers and a sister.  One brother had surgery for a brain tumor she thinks was benign - which sounds like a meningioma.  Her father is deceased and her mother is living.  The patient's mother has heart diease at 33.  She has a brother and sister. The sister was a smoker and had possible lung and liver cancer. The maternal grandparents are deceased. The grandfather had NHL and the grandmother had heart issues.  The patient's father had oral cancer in his 32's-50's, colon cancer around age 74 and liver cancer at 88.  He had tow brothers and three sisters.  One sister had breast cancer and died under age 50.  One brother had possible stomach cancer and the other brother had an NOS cancer. Both paternal grandparents died of non-cancer related issues.  Natasha Graham is unaware of previous family history of genetic testing for hereditary cancer risks. Patient's maternal ancestors are of [place] descent, and paternal ancestors are of Caucasian descent. There is no reported Ashkenazi Jewish ancestry. There is no known consanguinity.  GENETIC COUNSELING ASSESSMENT: Natasha Graham is a 59 y.o. female with a personal and family history of cancer which is somewhat  suggestive of a hereditary cancer syndrome and predisposition to cancer given the combination of cancer in the family and young age of onset her paternal aunt. We, therefore, discussed and recommended the following at today's visit.   DISCUSSION: We discussed that 5 - 10% of breast cancer is hereditary, with most cases associated with BRCA mutations.  There are other genes that can be associated with hereditary breast cancer syndromes.  These include ATM, CHEK2 and PALB2.  We discussed that testing is beneficial for several reasons including knowing how to follow individuals after completing their treatment,and understand if other family members could be at risk for cancer and allow them to undergo genetic testing.   We reviewed the characteristics, features and inheritance patterns of hereditary cancer syndromes. We also discussed genetic testing, including the appropriate family members to test, the process of testing, insurance coverage and turn-around-time for results. We discussed the implications of a negative, positive, carrier and/or variant of uncertain significant result. We recommended Natasha Graham pursue genetic testing for the CancerNext-Expanded gene panel. The CancerNext-Expanded gene panel offered by [facility] and includes sequencing and rearrangement analysis for the following 77 genes: AIP, ALK, APC*, ATM*, AXIN2, BAP1, BARD1, BLM, BMPR1A, BRCA1*, BRCA2*, BRIP1*, CDC73, CDH1*, CDK4, CDKN1B, CDKN2A, CHEK2*, CTNNA1, DICER1, FANCC, FH, FLCN, GALNT12, KIF1B, LZTR1, MAX, MEN1, MET, MLH1*, MSH2*, MSH3, MSH6*, MUTYH*, NBN, NF1*, NF2, NTHL1, PALB2*, PHOX2B, PMS2*, POT1, PRKAR1A, PTCH1, PTEN*, RAD51C*, RAD51D*, RB1, RECQL, RET, SDHA, SDHAF2, SDHB, SDHC, SDHD, SMAD4, SMARCA4, SMARCB1, SMARCE1, STK11, SUFU, TMEM127, TP53*, TSC1, TSC2, VHL and XRCC2 (sequencing and deletion/duplication); EGFR, EGLN1, HOXB13, KIT, MITF, PDGFRA, POLD1, and POLE (sequencing only); EPCAM and GREM1 (deletion/duplication  only). DNA and RNA analyses performed for * genes.   Based on Natasha Graham's personal and family history of cancer, she meets medical criteria for genetic testing. Despite that she meets criteria, she may still have an out of pocket cost. We discussed that if her out of pocket cost for testing is over $100, the laboratory will call and confirm whether  she wants to proceed with testing.  If the out of pocket cost of testing is less than $100 she will be billed by the genetic testing laboratory.   PLAN: After considering the risks, benefits, and limitations, Natasha Graham provided informed consent to pursue genetic testing and the blood sample was sent to [facility] for analysis of the CancerNext-Expanded+RNA. Results should be available within approximately 2-3 weeks' time, at which point they will be disclosed by telephone to Natasha Graham, as will any additional recommendations warranted by these results. Natasha Graham will receive a summary of her genetic counseling visit and a copy of her results once available. This information will also be available in Epic.   Lastly, we encouraged Natasha Graham to remain in contact with cancer genetics annually so that we can continuously update the family history and inform her of any changes in cancer genetics and testing that may be of benefit for this family.   Natasha Graham questions were answered to her satisfaction today. Our contact information was provided should additional questions or concerns arise. Thank you for the referral and allowing [place] to share in the care of your patient.   Aylah Yeary P. Florene Glen, [place], [facility] Licensed, Insurance risk surveyor [email or web address] phone: [phone]  The patient was seen for a total of 45 minutes in face-to-face genetic counseling.  This patient was discussed with Drs. Magrinat, Lindi Adie and/or Burr Medico who agrees with the above.     _______________________________________________________________________ For Office Staff:  Number of people involved in session: 1 Was an Intern/ student involved with case: no

## 2020-06-20 DIAGNOSIS — K439 Ventral hernia without obstruction or gangrene: Secondary | ICD-10-CM | POA: Diagnosis not present

## 2020-06-20 DIAGNOSIS — C50912 Malignant neoplasm of unspecified site of left female breast: Secondary | ICD-10-CM | POA: Diagnosis not present

## 2020-06-25 ENCOUNTER — Encounter: Payer: Self-pay | Admitting: Surgical

## 2020-06-25 ENCOUNTER — Other Ambulatory Visit: Payer: Self-pay

## 2020-06-25 ENCOUNTER — Ambulatory Visit (INDEPENDENT_AMBULATORY_CARE_PROVIDER_SITE_OTHER): Payer: BC Managed Care – PPO | Admitting: Surgical

## 2020-06-25 VITALS — BP 150/90 | HR 80 | Temp 96.9°F | Ht 63.0 in | Wt 169.0 lb

## 2020-06-25 DIAGNOSIS — Z9011 Acquired absence of right breast and nipple: Secondary | ICD-10-CM | POA: Diagnosis not present

## 2020-06-25 DIAGNOSIS — Z9889 Other specified postprocedural states: Secondary | ICD-10-CM | POA: Diagnosis not present

## 2020-06-25 DIAGNOSIS — Z9012 Acquired absence of left breast and nipple: Secondary | ICD-10-CM | POA: Diagnosis not present

## 2020-06-25 NOTE — Progress Notes (Signed)
Patient is a 58 year old female here for follow-up on her left breast reconstruction.  She has a history of a right mastectomy in September 2015 with placement of a saline implant.  She underwent left breast reconstruction with placement of tissue expander and Flex HD in January 2021 with Dr. Marla Roe.  Patient completed radiation to the left breast in June 2021.  She reports she tolerated her last fill fine without any issue.  She is interested in discussing exchange surgery.  Chaperone present on exam Well-developed well-nourished female, no acute distress Skin is warm and dry Normocephalic, atraumatic Normal pulmonary effort  On exam left breast incisions intact, she does have a crease within the left NAC.  No wounds noted.  The skin along the inferior pole of the left breast is tight and and firm from radiation.  No cellulitic changes.    Plan:  We discussed continuing to fill as able, however due to the tightness of the left breast skin from radiation it would be unlikely to be able to reach the same size as the right side.  Would like to see her back in 2 weeks for reevaluation and additional fill.  Patient is interested in extending prior to the end of the year, I discussed with her that due to surgical scheduling times available this may be possible however it is not guaranteed and I will discuss this with our surgical scheduling team to see if this is something we can arrange.  We placed injectable saline in the Expander using a sterile technique: Right: 600 cc saline implant Left: 50 cc for a total of 190/455 cc  Pictures were obtained of the patient and placed in the chart with the patient's or guardian's permission.

## 2020-06-28 ENCOUNTER — Ambulatory Visit: Payer: Self-pay | Admitting: Genetic Counselor

## 2020-06-28 ENCOUNTER — Telehealth: Payer: Self-pay | Admitting: Genetic Counselor

## 2020-06-28 DIAGNOSIS — Z1379 Encounter for other screening for genetic and chromosomal anomalies: Secondary | ICD-10-CM | POA: Insufficient documentation

## 2020-06-28 NOTE — Telephone Encounter (Signed)
Revealed negative genetic testing.  Discussed that we do not know why she has breast cancer or why there is cancer in the family. It could be due to a different gene that we are not testing, or maybe our current technology may not be able to pick something up.  It will be important for her to keep in contact with genetics to keep up with whether additional testing may be needed. 

## 2020-06-28 NOTE — Progress Notes (Signed)
HPI:  Ms. Nero was previously seen in the [place] clinic due to a personal and family history of cancer and concerns regarding a hereditary predisposition to cancer. Please refer to our prior cancer genetics clinic note for more information regarding our discussion, assessment and recommendations, at the time. Ms. Prosser recent genetic test results were disclosed to her, as were recommendations warranted by these results. These results and recommendations are discussed in more detail below.  CANCER HISTORY:  Oncology History  Breast neoplasm, Tis (DCIS), right  05/16/2014 Initial Diagnosis   Breast neoplasm, Tis (DCIS), right   06/27/2020 Genetic Testing   Negative genetic testing on the CancerNext-Expanded+RNAinsight panel testing.  The CancerNext-Expanded gene panel offered by [facility] and includes sequencing and rearrangement analysis for the following 77 genes: AIP, ALK, APC*, ATM*, AXIN2, BAP1, BARD1, BLM, BMPR1A, BRCA1*, BRCA2*, BRIP1*, CDC73, CDH1*, CDK4, CDKN1B, CDKN2A, CHEK2*, CTNNA1, DICER1, FANCC, FH, FLCN, GALNT12, KIF1B, LZTR1, MAX, MEN1, MET, MLH1*, MSH2*, MSH3, MSH6*, MUTYH*, NBN, NF1*, NF2, NTHL1, PALB2*, PHOX2B, PMS2*, POT1, PRKAR1A, PTCH1, PTEN*, RAD51C*, RAD51D*, RB1, RECQL, RET, SDHA, SDHAF2, SDHB, SDHC, SDHD, SMAD4, SMARCA4, SMARCB1, SMARCE1, STK11, SUFU, TMEM127, TP53*, TSC1, TSC2, VHL and XRCC2 (sequencing and deletion/duplication); EGFR, EGLN1, HOXB13, KIT, MITF, PDGFRA, POLD1, and POLE (sequencing only); EPCAM and GREM1 (deletion/duplication only). DNA and RNA analyses performed for * genes. The report date is 06/27/2020.     FAMILY HISTORY:  We obtained a detailed, 4-generation family history.  Significant diagnoses are listed below: Family History  Problem Relation Age of Onset  . Heart disease Mother   . Cancer Father        liver  . Colon cancer Father 31  . Head & neck cancer Father        oral cancer - dx in 48s-50s  . Breast cancer  Paternal Aunt        dx <50  . Brain cancer Brother        possible meningioma  . Cancer Maternal Aunt        liver/lung cancer  . Stomach cancer Paternal Uncle   . Heart disease Maternal Grandmother   . Lymphoma Maternal Grandfather        NHL  . Heart attack Paternal Grandfather   . Cancer Paternal Uncle        NOS    The patient hs one son and two daughters who are cancer free. She ha two brothers and a sister.  One brother had surgery for a brain tumor she thinks was benign - which sounds like a meningioma.  Her father is deceased and her mother is living.  The patient's mother has heart diease at 53.  She has a brother and sister. The sister was a smoker and had possible lung and liver cancer. The maternal grandparents are deceased. The grandfather had NHL and the grandmother had heart issues.  The patient's father had oral cancer in his 34's-50's, colon cancer around age 79 and liver cancer at 44.  He had tow brothers and three sisters.  One sister had breast cancer and died under age 63.  One brother had possible stomach cancer and the other brother had an NOS cancer. Both paternal grandparents died of non-cancer related issues.  Ms. Shoe is unaware of previous family history of genetic testing for hereditary cancer risks. Patient's maternal ancestors are of [place] descent, and paternal ancestors are of Caucasian descent. There is no reported Ashkenazi Jewish ancestry. There is no known consanguinity.  GENETIC TEST RESULTS: Genetic testing reported out on June 27, 2020 through the CancerNext-Expanded+RNAinsight cancer panel found no pathogenic mutations. The CancerNext-Expanded gene panel offered by [facility] and includes sequencing and rearrangement analysis for the following 77 genes: AIP, ALK, APC*, ATM*, AXIN2, BAP1, BARD1, BLM, BMPR1A, BRCA1*, BRCA2*, BRIP1*, CDC73, CDH1*, CDK4, CDKN1B, CDKN2A, CHEK2*, CTNNA1, DICER1, FANCC, FH, FLCN, GALNT12, KIF1B, LZTR1, MAX,  MEN1, MET, MLH1*, MSH2*, MSH3, MSH6*, MUTYH*, NBN, NF1*, NF2, NTHL1, PALB2*, PHOX2B, PMS2*, POT1, PRKAR1A, PTCH1, PTEN*, RAD51C*, RAD51D*, RB1, RECQL, RET, SDHA, SDHAF2, SDHB, SDHC, SDHD, SMAD4, SMARCA4, SMARCB1, SMARCE1, STK11, SUFU, TMEM127, TP53*, TSC1, TSC2, VHL and XRCC2 (sequencing and deletion/duplication); EGFR, EGLN1, HOXB13, KIT, MITF, PDGFRA, POLD1, and POLE (sequencing only); EPCAM and GREM1 (deletion/duplication only). DNA and RNA analyses performed for * genes. The test report has been scanned into EPIC and is located under the Molecular Pathology section of the Results Review tab.  A portion of the result report is included below for reference.     We discussed with Ms. Scannell that because current genetic testing is not perfect, it is possible there may be a gene mutation in one of these genes that current testing cannot detect, but that chance is small.  We also discussed, that there could be another gene that has not yet been discovered, or that we have not yet tested, that is responsible for the cancer diagnoses in the family. It is also possible there is a hereditary cause for the cancer in the family that Ms. Perry did not inherit and therefore was not identified in her testing.  Therefore, it is important to remain in touch with cancer genetics in the future so that we can continue to offer Ms. Lotz the most up to date genetic testing.   ADDITIONAL GENETIC TESTING:  We discussed with Ms. Worthey that her genetic testing was fairly extensive.  If there are genes identified to increase cancer risk that can be analyzed in the future, we would be happy to discuss and coordinate this testing at that time.    CANCER SCREENING RECOMMENDATIONS: Ms. Street test result is considered negative (normal).  This means that we have not identified a hereditary cause for her personal and family history of cancer at this time. Most cancers happen by chance and this negative test suggests that her  cancer may fall into this category.    While reassuring, this does not definitively rule out a hereditary predisposition to cancer. It is still possible that there could be genetic mutations that are undetectable by current technology. There could be genetic mutations in genes that have not been tested or identified to increase cancer risk.  Therefore, it is recommended she continue to follow the cancer management and screening guidelines provided by her oncology and primary healthcare provider.   An individual's cancer risk and medical management are not determined by genetic test results alone. Overall cancer risk assessment incorporates additional factors, including personal medical history, family history, and any available genetic information that may result in a personalized plan for cancer prevention and surveillance  RECOMMENDATIONS FOR FAMILY MEMBERS:  Individuals in this family might be at some increased risk of developing cancer, over the general population risk, simply due to the family history of cancer.  We recommended women in this family have a yearly mammogram beginning at age 59, or 64 years younger than the earliest onset of cancer, an annual clinical breast exam, and perform monthly breast self-exams. Women in this family should also have a  gynecological exam as recommended by their primary provider. All family members should be referred for colonoscopy starting at age 36.  FOLLOW-UP: Lastly, we discussed with Ms. Waymire that cancer genetics is a rapidly advancing field and it is possible that new genetic tests will be appropriate for her and/or her family members in the future. We encouraged her to remain in contact with cancer genetics on an annual basis so we can update her personal and family histories and let her know of advances in cancer genetics that may benefit this family.   Our contact number was provided. Ms. Rambeau questions were answered to her satisfaction, and she  knows she is welcome to call us at anytime with additional questions or concerns.   Roma Kayser, [place], [employer] Licensed, Certified Genetic Counselor [email or web address]

## 2020-07-09 ENCOUNTER — Ambulatory Visit (INDEPENDENT_AMBULATORY_CARE_PROVIDER_SITE_OTHER): Payer: BC Managed Care – PPO | Admitting: Surgical

## 2020-07-09 ENCOUNTER — Other Ambulatory Visit: Payer: Self-pay

## 2020-07-09 ENCOUNTER — Encounter: Payer: Self-pay | Admitting: Surgical

## 2020-07-09 VITALS — BP 155/83 | HR 79 | Temp 98.7°F

## 2020-07-09 DIAGNOSIS — Z9889 Other specified postprocedural states: Secondary | ICD-10-CM | POA: Diagnosis not present

## 2020-07-09 DIAGNOSIS — Z9012 Acquired absence of left breast and nipple: Secondary | ICD-10-CM

## 2020-07-09 DIAGNOSIS — Z9011 Acquired absence of right breast and nipple: Secondary | ICD-10-CM | POA: Diagnosis not present

## 2020-07-09 NOTE — Progress Notes (Signed)
Patient is a 59 year old female here for follow-up on her left breast reconstruction.  Patient has a history of right mastectomy with placement of a saline implant in 2015.  She most recently underwent radiation to the left breast in June 2021.  She is here today for additional fill/reevaluation. Patient reports she did well after last fill.  Chaperone present on exam On exam left breast incisions intact, left NAC with good color, no wounds noted.  Left lateral and vertical limb skin is quite tight, likely due to the radiation damage.  No erythema noted.   We placed injectable saline in the Expander using a sterile technique: Right: 600 cc saline implant Left: 50 cc for a total of 240/ 455 cc  Recommend following up in 2 weeks with Dr. Marla Roe to further evaluate, additional fill, discuss surgical options. She has surgery scheduled for 08/21/2020 for removal of tissue expander placement of implant.

## 2020-07-22 ENCOUNTER — Telehealth: Payer: Self-pay

## 2020-07-22 NOTE — Telephone Encounter (Signed)
Pt called and asked if she would need appt for CT before visit with Dr Jana Hakim. PA not obtained at this time. Message sent to PA team to have authorized. Explained to pt we have to wait for PA then will schedule. Pt states early mornings work best for her.

## 2020-07-23 ENCOUNTER — Ambulatory Visit (INDEPENDENT_AMBULATORY_CARE_PROVIDER_SITE_OTHER): Payer: BC Managed Care – PPO | Admitting: Surgical

## 2020-07-23 ENCOUNTER — Other Ambulatory Visit: Payer: Self-pay

## 2020-07-23 ENCOUNTER — Telehealth: Payer: Self-pay

## 2020-07-23 ENCOUNTER — Encounter: Payer: Self-pay | Admitting: Surgical

## 2020-07-23 VITALS — BP 160/81 | HR 85 | Temp 98.7°F

## 2020-07-23 DIAGNOSIS — Z170 Estrogen receptor positive status [ER+]: Secondary | ICD-10-CM

## 2020-07-23 DIAGNOSIS — C50512 Malignant neoplasm of lower-outer quadrant of left female breast: Secondary | ICD-10-CM

## 2020-07-23 DIAGNOSIS — Z9012 Acquired absence of left breast and nipple: Secondary | ICD-10-CM

## 2020-07-23 DIAGNOSIS — Z9011 Acquired absence of right breast and nipple: Secondary | ICD-10-CM

## 2020-07-23 DIAGNOSIS — Z9889 Other specified postprocedural states: Secondary | ICD-10-CM

## 2020-07-23 NOTE — Progress Notes (Signed)
Patient is a 59 year old female here for follow-up on her left breast reconstruction.  She completed radiation of her left breast in June 2021 which has caused some tightness of the left breast. She currently has a right breast saline implant in place, this was placed in 2015 by Dr. Marla Roe.  The implant is a Mentor smooth round high-profile saline 500 cc implant, but it was felt to 600 cc intraoperatively on 08/08/2014.  She is scheduled for removal of tissue expander and placement of implant with Dr. Marla Roe on 08/21/2020.  Chaperone present on exam On exam left breast incisions intact, left breast skin is fairly tight with some scar contracture noted laterally and inferiorly.  There is no wounds noted.  Area is nontender to palpation.  No erythema.  Left breast is smaller and firmer than previously reconstructed right breast.  Dr. Marla Roe was consulted during today's evaluation to discuss reconstructive options.  Patient is scheduled for removal of the left tissue expander and placement of a left breast implant on 08/21/2020 with Dr. Marla Roe, however after today's fill I do not feel as if they are symmetric enough to warrant exchange this soon.  We discussed the risks of expanding the left breast too much and causing poor wound healing postoperatively after the exchange.  We also discussed that due to the tightness of the skin noted today it may be unlikely that we will be able to match the right breast.  We discussed various options including removal of the right breast implant for a smaller implant to match the smaller left breast.  We also discussed latissimus myocutaneous flap to the left breast for increased volume to match the right breast.  Patient is very happy with the size of the right breast and would like to have the left to match this.  Dr. Marla Roe was present for this conversation and led to a conversation.  She would like to see the patient back in 2 weeks for reevaluation to  see if we are able to expand anymore.    Patient is in agreement with this plan, we will see her in 2 weeks and further discuss. Please call with any questions or concerns.  We placed injectable saline in the Expander using a sterile technique: Right: 600 cc Mentor smooth round high-profile saline implant 500 cc Left: 50 cc for a total of 290 / 455 cc

## 2020-07-23 NOTE — Telephone Encounter (Signed)
Pt scheduled for CT neck/chest 08/02/20. Pt understands to arrive at 0700 and NPO 4 hrs prior to scans.

## 2020-08-01 ENCOUNTER — Other Ambulatory Visit: Payer: Self-pay

## 2020-08-01 ENCOUNTER — Inpatient Hospital Stay: Payer: BC Managed Care – PPO | Attending: Oncology

## 2020-08-01 DIAGNOSIS — Z923 Personal history of irradiation: Secondary | ICD-10-CM | POA: Insufficient documentation

## 2020-08-01 DIAGNOSIS — Z79810 Long term (current) use of selective estrogen receptor modulators (SERMs): Secondary | ICD-10-CM | POA: Diagnosis not present

## 2020-08-01 DIAGNOSIS — Z170 Estrogen receptor positive status [ER+]: Secondary | ICD-10-CM | POA: Insufficient documentation

## 2020-08-01 DIAGNOSIS — Z9013 Acquired absence of bilateral breasts and nipples: Secondary | ICD-10-CM | POA: Diagnosis not present

## 2020-08-01 DIAGNOSIS — Z79899 Other long term (current) drug therapy: Secondary | ICD-10-CM | POA: Diagnosis not present

## 2020-08-01 DIAGNOSIS — C50512 Malignant neoplasm of lower-outer quadrant of left female breast: Secondary | ICD-10-CM | POA: Insufficient documentation

## 2020-08-01 DIAGNOSIS — C50912 Malignant neoplasm of unspecified site of left female breast: Secondary | ICD-10-CM

## 2020-08-01 LAB — COMPREHENSIVE METABOLIC PANEL
ALT: 20 U/L (ref 0–44)
AST: 17 U/L (ref 15–41)
Albumin: 3.7 g/dL (ref 3.5–5.0)
Alkaline Phosphatase: 68 U/L (ref 38–126)
Anion gap: 8 (ref 5–15)
BUN: 10 mg/dL (ref 6–20)
CO2: 27 mmol/L (ref 22–32)
Calcium: 9.3 mg/dL (ref 8.9–10.3)
Chloride: 105 mmol/L (ref 98–111)
Creatinine, Ser: 0.74 mg/dL (ref 0.44–1.00)
GFR, Estimated: >60 mL/min (ref >60)
Glucose, Bld: 101 mg/dL — ABNORMAL HIGH (ref 70–99)
Potassium: 3.5 mmol/L (ref 3.5–5.1)
Sodium: 140 mmol/L (ref 135–145)
Total Bilirubin: 0.3 mg/dL (ref 0.3–1.2)
Total Protein: 6.6 g/dL (ref 6.5–8.1)

## 2020-08-01 LAB — CBC WITH DIFFERENTIAL/PLATELET
Abs Immature Granulocytes: 0.02 10*3/uL (ref 0.00–0.07)
Basophils Absolute: 0 10*3/uL (ref 0.0–0.1)
Basophils Relative: 1 %
Eosinophils Absolute: 0.1 10*3/uL (ref 0.0–0.5)
Eosinophils Relative: 2 %
HCT: 37.5 % (ref 36.0–46.0)
Hemoglobin: 13 g/dL (ref 12.0–15.0)
Immature Granulocytes: 1 %
Lymphocytes Relative: 28 %
Lymphs Abs: 1.1 10*3/uL (ref 0.7–4.0)
MCH: 32.2 pg (ref 26.0–34.0)
MCHC: 34.7 g/dL (ref 30.0–36.0)
MCV: 92.8 fL (ref 80.0–100.0)
Monocytes Absolute: 0.4 10*3/uL (ref 0.1–1.0)
Monocytes Relative: 9 %
Neutro Abs: 2.4 10*3/uL (ref 1.7–7.7)
Neutrophils Relative %: 59 %
Platelets: 249 10*3/uL (ref 150–400)
RBC: 4.04 MIL/uL (ref 3.87–5.11)
RDW: 11.7 % (ref 11.5–15.5)
WBC: 4 10*3/uL (ref 4.0–10.5)
nRBC: 0 % (ref 0.0–0.2)

## 2020-08-02 ENCOUNTER — Ambulatory Visit (HOSPITAL_COMMUNITY)
Admission: RE | Admit: 2020-08-02 | Discharge: 2020-08-02 | Disposition: A | Discharge status: Home / Self Care | Payer: BC Managed Care – PPO | Source: Ambulatory Visit | Attending: Oncology | Admitting: Oncology

## 2020-08-02 DIAGNOSIS — C50512 Malignant neoplasm of lower-outer quadrant of left female breast: Secondary | ICD-10-CM | POA: Diagnosis not present

## 2020-08-02 DIAGNOSIS — R599 Enlarged lymph nodes, unspecified: Secondary | ICD-10-CM | POA: Diagnosis not present

## 2020-08-02 DIAGNOSIS — C50919 Malignant neoplasm of unspecified site of unspecified female breast: Secondary | ICD-10-CM | POA: Diagnosis not present

## 2020-08-02 DIAGNOSIS — R911 Solitary pulmonary nodule: Secondary | ICD-10-CM | POA: Diagnosis not present

## 2020-08-02 DIAGNOSIS — Z170 Estrogen receptor positive status [ER+]: Secondary | ICD-10-CM | POA: Diagnosis not present

## 2020-08-02 DIAGNOSIS — Z981 Arthrodesis status: Secondary | ICD-10-CM | POA: Diagnosis not present

## 2020-08-02 DIAGNOSIS — D0511 Intraductal carcinoma in situ of right breast: Secondary | ICD-10-CM | POA: Diagnosis not present

## 2020-08-02 DIAGNOSIS — I700 Atherosclerosis of aorta: Secondary | ICD-10-CM | POA: Diagnosis not present

## 2020-08-02 DIAGNOSIS — R918 Other nonspecific abnormal finding of lung field: Secondary | ICD-10-CM | POA: Diagnosis not present

## 2020-08-02 DIAGNOSIS — C7951 Secondary malignant neoplasm of bone: Secondary | ICD-10-CM | POA: Diagnosis not present

## 2020-08-02 DIAGNOSIS — Z853 Personal history of malignant neoplasm of breast: Secondary | ICD-10-CM | POA: Diagnosis not present

## 2020-08-02 LAB — POCT I-STAT CREATININE: Creatinine, Ser: 0.7 mg/dL (ref 0.44–1.00)

## 2020-08-02 MED ORDER — IOHEXOL 300 MG/ML  SOLN
75.0000 mL | Freq: Once | INTRAMUSCULAR | Status: AC | PRN
  Administered 2020-08-02: 75 mL via INTRAVENOUS

## 2020-08-04 NOTE — Progress Notes (Signed)
[place]  [phone] [phone]) [phone]     ID: Natasha Graham DOB: [birth year]  MR#: [id number]  YPP#:[id number]  Patient Care Team: Leonides Sake, MD as PCP - General (Family Medicine) Rolm Bookbinder, MD as Consulting Physician (General Surgery) Keyondra Lagrand, Virgie Dad, MD as Consulting Physician (Oncology) Mauro Kaufmann, RN as Registered Nurse Rockwell Germany, RN as Registered Nurse Dillingham, Loel Lofty, DO as Attending Physician (Plastic Surgery) Gery Pray, MD as Consulting Physician (Radiation Oncology) OTHER MD: Sherrie Mustache MD   CHIEF COMPLAINT: new estrogen receptor positive left breast cancer; remote noninvasive right breast cancer (s/p bilateral mastectomies)  CURRENT TREATMENT: Tamoxifen; completion of reconstruction pending   INTERVAL HISTORY: Natasha Graham returns today for follow up of her newly diagnosed invasive left breast cancer.  She started tamoxifen 02/29/2020. She is tolerating this well.  Hot flashes and vaginal discharge are not an issue.  Since her last visit, she underwent genetic testing and counseling on 06/10/2020. Results were negative.  She also underwent restaging chest CT on 08/02/2020 showing: findings presumed to represent post radiation changes, including a small ground-glass nodule also likely part of this process; diffuse bony metastatic disease with involvement of all visualized bones; unchanged liver cysts; hepatic steatosis; aortic atherosclerosis.  Neck CT performed the same day showed: decreased size of small left supraclavicular lymph nodes; no evidence of new cervical lymphadenopathy; widespread sclerotic bony metastases.  Of note, her prior [place] PET scan on 11/20/2019 showed no skeletal disease.  She is scheduled for left breast implant placement on 08/21/2020 under Dr. Marla Roe with contralateral reduction.   REVIEW OF SYSTEMS: Natasha Graham generally feels "good".  She has no pain.  She uses her  treadmill just about daily and goes at least a mile.  She has had no unusual headaches visual changes nausea vomiting problems with balance, falls, cough phlegm production pleurisy shortness of breath or change in bowel or bladder habits.  There has been no weight loss, fatigue, rash, or bleeding.  A detailed review of systems was entirely benign.   COVID 19 VACCINATION STATUS: Patient had antibodies to the virus documented November 2020.  She is not planning on vaccinations   LEFT BREAST CANCER HISTORY: From the original intake note:  She had routine screening left mammogram on 06/14/2019 showing a developing asymmetry. She presented for left diagnostic mammogram and left breast ultrasound on 07/07/2019. Physical exam that day showed a palpable, firm, superficial mass in the left breast at 6 o'clock along the site of reduction mammoplasty scar (performed in 2015 with right mastectomy). Scans showed: breast density category B; 2.1 cm superficial mass involving the skin in the left breast at 6 o'clock; no enlarged adenopathy in the left axilla.   Accordingly on 07/12/2019 she proceeded to biopsy of the left breast area in question. The pathology from this procedure ([id number]) showed: invasive lobular carcinoma, grade 1, with perineural invasion present. Prognostic indicators significant for: estrogen receptor, 60% positive with moderate staining intensity and progesterone receptor, 0% negative. Proliferation marker Ki67 at <1%. HER2 equvocal by immunohistochemistry (2+), but negative by fluorescent in situ hybridization with a signals ratio 1.18 and number per cell 1.95.  She opted to proceed with left mastectomy on 09/27/2019 under Dr. Donne Hazel with immediate reconstruction under Dr. Marla Roe. Pathology from the procedure [id number]) revealed: invasive lobular carcinoma, grade 2, 6.2 cm, focally involving anterior margin and involving skin dermis without involvement of epidermis; lobular carcinoma  in situ with pagetoid spread; small focus of low grade ductal  carcinoma in situ, 0.2 cm.  Two lymph nodes were biopsied. One showed metastatic lobular carcinoma with focus measuring 2.1 cm with evidence of extranodal extension. The other lymph node also showed metastatic lobular carcinoma with no lymphoid tissue present.  Biopsy of the nipple was also taken at that time and showed no evidence of invasive carcinoma.  Mammaprint was performed on the final surgical sample. This returned showing low risk.  RIGHT BREAST CANCER HISTORY: Natasha Graham has a history of right breast biopsy in late 2007 showing atypical ductal hyperplasia in the upper outer right breast.. Breast MRI 07/13/2006 showed postbiopsy changes in the upper right breast, and some right breast cysts. There were no solid masses however or areas of abnormal enhancement. There were no enlarged lymph nodes. Incidental finding of liver cysts was made. On 07/30/2006 she underwent needle localization of the calcifications in the outer right breast which also showed ([id number]) atypical ductal hyperplasia, with no malignancy.   On 10/21/2012 routine digital bilateral screening mammography at the breast Center showed breast density to be category C. In the right breast there were calcifications and architectural distortion to go with skin retraction. Additional views obtained 11/17/2012 showed extensive but stable punctate microcalcifications in the upper outer right breast. Many of these were superficial and not amenable to stereotactic biopsy. MRI was suggested but the patient was unable to undergo that test and therefore six-month mammography was obtained 05/15/2013. This showed the calcifications in the outer right breast to have been stable. Because of their extents and because of their superficial location they could not be all sampled all removed with biopsy. MRI was again discussed, but the patient has Harrington rods in place and also felt the cost  of MRI was prohibitive.  On 11/13/2013 further six-month follow-up showed a grouping of intermediate microcalcifications in the lateral right breast and these were biopsied 11/20/2013. The result of that procedure ([id number]) showed atypical ductal hyperplasia. Accordingly on 02/23/2014 the patient underwent right lumpectomy, with the pathology ([id number]) showing ductal carcinoma in situ, grade 2, measuring 0.7 cm. Estrogen receptor was 100% positive. Progesterone receptor was 96% positive. Both showed strong staining intensity. Margins were clear but the closest margin anteriorly was at 1 mm.  On 03/07/2014 the patient underwent postoperative mammography which showed post lumpectomy changes but also calcifications extending from the lumpectomy site anteriorly towards the nipple. On 03/12/2014 the patient underwent bilateral breast MRI. Breast composition here was described as category B. In the right breast there was a 4.8 cm fluidlike excisional cavity in the lower outer right breast. Adjacent to this was a focus of non-masslike enhancement measuring 1.9 cm. This is an area associated with the scar from the excision in 2007. There was also non-masslike enhancement inferior to the biopsy cavity measuring 3.8 cm. There were no abnormal lymph nodes and no masses or abnormal enhancement in the left breast. Specifically the left breast lower outer quadrant was unchanged from 2007 MRI.  Overall it was felt the linear non-masslike enhancement extending almost 4 cm inferior to the excisional cavity was sufficiently suspicious to warrant biopsy, and this was performed 03/29/2014. It again showed ductal carcinoma in situ, grade 1, estrogen receptor and progesterone receptor both 100% positive with strong staining intensity.  At this point the patient met with surgery and opted for definitive right mastectomy with immediate reconstruction. On 05/16/2014 she underwent simple mastectomy with sentinel lymph  node sampling. The pathology ([id number]) showed atypical ductal hyperplasia but no evidence of neoplasia. All  5 sentinel lymph nodes were clear. The patient had immediate right breast reconstruction with tissue expander and dermal matrix placement.  She is scheduled for definitive implant placement 08/08/2014.  Her subsequent history is as detailed below   PAST MEDICAL HISTORY: Past Medical History:  Diagnosis Date  . Cancer [facility])    breast cancer  . Family history of brain cancer   . Family history of breast cancer   . Family history of colon cancer   . Family history of stomach cancer   . Hypertension   . PONV (postoperative nausea and vomiting)   . Scoliosis   . Wears glasses     PAST SURGICAL HISTORY: Past Surgical History:  Procedure Laterality Date  . AUGMENTATION MAMMAPLASTY Right    2015 post mastectomy  . AXILLARY LYMPH NODE DISSECTION Left 11/13/2019   Procedure: LEFT AXILLARY LYMPH NODE DISSECTION;  Surgeon: Emelia Loron, MD;  Location: [facility];  Service: General;  Laterality: Left;  . BREAST RECONSTRUCTION WITH PLACEMENT OF TISSUE EXPANDER AND FLEX HD (ACELLULAR HYDRATED DERMIS) Right 05/16/2014   Procedure: IMMEDIATE RIGHT BREAST RECONSTRUCTION WITH PLACEMENT OF TISSUE EXPANDER AND FLEX HD (ACELLULAR HYDRATED DERMIS);  Surgeon: Wayland Denis, DO;  Location: [facility] OR;  Service: Plastics;  Laterality: Right;  . BREAST RECONSTRUCTION WITH PLACEMENT OF TISSUE EXPANDER AND FLEX HD (ACELLULAR HYDRATED DERMIS) Left 09/27/2019   Procedure: LEFT BREAST RECONSTRUCTION WITH PLACEMENT OF TISSUE EXPANDER AND FLEX HD (ACELLULAR HYDRATED DERMIS);  Surgeon: Peggye Form, DO;  Location: [facility];  Service: Plastics;  Laterality: Left;  . BREAST SURGERY     right breast excisional biopsy  . DILATION AND CURETTAGE OF UTERUS    . HYSTEROSCOPY WITH D & C N/A 09/20/2015   Procedure: DILATATION AND CURETTAGE /HYSTEROSCOPY;  Surgeon: Olivia Mackie,  MD;  Location: [facility];  Service: Gynecology;  Laterality: N/A;  . LIPOSUCTION Bilateral 08/08/2014   Procedure: LIPO SUCTION ;  Surgeon: Wayland Denis, DO;  Location: [facility];  Service: Plastics;  Laterality: Bilateral;  . MASTECTOMY Right 05/16/2014   placement of acellular dermal matrix & tissue expanders   . MASTOPEXY Left 08/08/2014   Procedure:  MASTOPEXY FOR SYMMETRY;  Surgeon: Wayland Denis, DO;  Location: [facility];  Service: Plastics;  Laterality: Left;  . NIPPLE SPARING MASTECTOMY WITH SENTINEL LYMPH NODE BIOPSY Left 09/27/2019   Procedure: LEFT NIPPLE SPARING MASTECTOMY WITH LEFT AXILLARY SENTINEL LYMPH NODE BIOPSY;  Surgeon: Emelia Loron, MD;  Location: [facility];  Service: General;  Laterality: Left;  . REDUCTION MAMMAPLASTY Left    2015  . REMOVAL OF TISSUE EXPANDER AND PLACEMENT OF IMPLANT Right 08/08/2014   Procedure: REMOVAL OF RIGHT  TISSUE EXPANDERS WITH PLACEMENT OF RIGHT BREAST IMPLANTS WITH LIPO SUCTION ;  Surgeon: Wayland Denis, DO;  Location: [facility];  Service: Plastics;  Laterality: Right;  . SCAR REVISION Left 11/13/2019   Procedure: EXCISION OF LEFT MASTECTOMY SKIN;  Surgeon: Emelia Loron, MD;  Location: [facility];  Service: General;  Laterality: Left;  . scoliosis  1972   harrington rods-age 1  . TONSILLECTOMY      FAMILY HISTORY Family History  Problem Relation Age of Onset  . Heart disease Mother   . Cancer Father        liver  . Colon cancer Father 89  . Head & neck cancer Father        oral cancer - dx in 39s-50s  .  Breast cancer Paternal Aunt        dx <50  . Brain cancer Brother        possible meningioma  . Cancer Maternal Aunt        liver/lung cancer  . Stomach cancer Paternal Uncle   . Heart disease Maternal Grandmother   . Lymphoma Maternal Grandfather        NHL  . Heart attack Paternal Grandfather   . Cancer Paternal Uncle        NOS   there is  significant cancer history on the paternal side, her father dying at the age of 76 with what the patient tells me was metastatic head and neck cancer (but also involving the colon and liver). One of the patient's father's brothers had stomach cancer, and another had a brain tumor, and 2 of the patient's father's sisters had breast cancer, 1 diagnosed at age 64, the other at age 37.  On the mother's side there is a history of non-Hodgkin's lymphoma at age 18. There is no history of ovarian cancer in the family   GYNECOLOGIC HISTORY:  Patient's last menstrual period was 10/16/2013.  menarche age 57, first live birth age 29. The patient is GX P3. She stopped having periods in October 2014. She did not take hormone replacement. She did take birth control pills for approximately 1 year remotely, with no complications.   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 She works as an Web designer. Son Truman Hayward drives a truck for the family business. Daughter Thayer Headings is a Forensic psychologist and currently works as an IT trainer in [place].  Daughter Roselyn Reef is studying history and English at [employer]. The patient has 2 granddaughters aged 25 and 1 as of May 2021. She attends [employer]    ADVANCED DIRECTIVES: In the absence of any documents to the contrary the patient's husband is her healthcare power of attorney   HEALTH MAINTENANCE: Social History   Tobacco Use  . Smoking status: Never Smoker  . Smokeless tobacco: Never Used  Vaping Use  . Vaping Use: Never used  Substance Use Topics  . Alcohol use: No  . Drug use: No     Colonoscopy:  2013  PAP: March 2015  Bone density:  Lipid panel:  No Known Allergies  Current Outpatient Medications  Medication Sig Dispense Refill  . acetaminophen (TYLENOL) 500 MG tablet Take by mouth.    . Ascorbic Acid (VITAMIN C) 100 MG tablet Take 100 mg by mouth  daily.    . Flaxseed Oil (LINSEED OIL) OIL by Misc.(Non-Drug; Combo Route) route.    . Ginger, Zingiber officinalis, (GINGER PO) Take by mouth.    Marland Kitchen ibuprofen (ADVIL) 200 MG tablet Take by mouth.    Marland Kitchen lisinopril-hydrochlorothiazide (PRINZIDE,ZESTORETIC) 20-25 MG per tablet Take 1 tablet by mouth every evening.     . Magnesium 250 MG TABS Take by mouth.    . Multiple Vitamin (MULTIVITAMIN) capsule Take by mouth.    . tamoxifen (NOLVADEX) 20 MG tablet Take 20 mg by mouth daily.    . Turmeric (QC TUMERIC COMPLEX) 500 MG CAPS Take by mouth.     No current facility-administered medications for this visit.    OBJECTIVE: White woman who appears younger than stated age 14:   08/05/20 0912  BP: (!) 144/65  Pulse: 89  Resp: 18  Temp: 97.6 F (36.4 C)  SpO2: 100%  Body mass index is 29.49 kg/m.    ECOG FS:1 - Symptomatic but completely ambulatory  Sclerae unicteric, EOMs intact Wearing a mask No cervical or supraclavicular adenopathy Lungs no rales or rhonchi Heart regular rate and rhythm Abd soft, nontender, positive bowel sounds MSK no focal spinal tenderness, no upper extremity lymphedema Neuro: nonfocal, well oriented, appropriate affect Breasts: The right breast is unremarkable.  The left breast is status post mastectomy with expander in place.  There is no evidence of local recurrence.  Both axillae are benign.   LAB RESULTS:  CMP     Component Value Date/Time   NA 140 08/01/2020 0839   NA 143 07/30/2014 1603   K 3.5 08/01/2020 0839   K 3.5 07/30/2014 1603   CL 105 08/01/2020 0839   CO2 27 08/01/2020 0839   CO2 30 (H) 07/30/2014 1603   GLUCOSE 101 (H) 08/01/2020 0839   GLUCOSE 93 07/30/2014 1603   BUN 10 08/01/2020 0839   BUN 10.5 07/30/2014 1603   CREATININE 0.70 08/02/2020 0749   CREATININE 0.9 07/30/2014 1603   CALCIUM 9.3 08/01/2020 0839   CALCIUM 9.5 07/30/2014 1603   PROT 6.6 08/01/2020 0839   PROT 7.0 07/30/2014 1603   ALBUMIN 3.7 08/01/2020 0839    ALBUMIN 4.0 07/30/2014 1603   AST 17 08/01/2020 0839   AST 23 07/30/2014 1603   ALT 20 08/01/2020 0839   ALT 23 07/30/2014 1603   ALKPHOS 68 08/01/2020 0839   ALKPHOS 78 07/30/2014 1603   BILITOT 0.3 08/01/2020 0839   BILITOT 0.29 07/30/2014 1603   GFRNONAA >60 08/01/2020 0839   GFRAA >60 04/29/2020 0829    INo results found for: SPEP, UPEP  Lab Results  Component Value Date   WBC 4.0 08/01/2020   NEUTROABS 2.4 08/01/2020   HGB 13.0 08/01/2020   HCT 37.5 08/01/2020   MCV 92.8 08/01/2020   PLT 249 08/01/2020      Chemistry      Component Value Date/Time   NA 140 08/01/2020 0839   NA 143 07/30/2014 1603   K 3.5 08/01/2020 0839   K 3.5 07/30/2014 1603   CL 105 08/01/2020 0839   CO2 27 08/01/2020 0839   CO2 30 (H) 07/30/2014 1603   BUN 10 08/01/2020 0839   BUN 10.5 07/30/2014 1603   CREATININE 0.70 08/02/2020 0749   CREATININE 0.9 07/30/2014 1603      Component Value Date/Time   CALCIUM 9.3 08/01/2020 0839   CALCIUM 9.5 07/30/2014 1603   ALKPHOS 68 08/01/2020 0839   ALKPHOS 78 07/30/2014 1603   AST 17 08/01/2020 0839   AST 23 07/30/2014 1603   ALT 20 08/01/2020 0839   ALT 23 07/30/2014 1603   BILITOT 0.3 08/01/2020 0839   BILITOT 0.29 07/30/2014 1603      No results found for: LABCA2  No components found for: LABCA125  No results for input(s): INR in the last 168 hours.  Urinalysis No results found for: COLORURINE, APPEARANCEUR, LABSPEC, PHURINE, GLUCOSEU, HGBUR, BILIRUBINUR, KETONESUR, PROTEINUR, UROBILINOGEN, NITRITE, LEUKOCYTESUR   STUDIES: CT Soft Tissue Neck W Contrast  Result Date: 08/02/2020 CLINICAL DATA:  Lymphadenopathy.  History of breast cancer. EXAM: CT NECK WITH CONTRAST TECHNIQUE: Multidetector CT imaging of the neck was performed using the standard protocol following the bolus administration of intravenous contrast. CONTRAST:  74mL OMNIPAQUE IOHEXOL 300 MG/ML  SOLN COMPARISON:  PET-CT 11/20/2019 FINDINGS: Pharynx and larynx: No evidence  of mass or swelling. Salivary glands: No inflammation, mass, or stone. Thyroid:  Unremarkable. Lymph nodes: 5 mm short axis left level II lymph nodes are unchanged in size and did not demonstrate abnormal uptake on the prior PET. Small left supraclavicular lymph nodes with abnormal uptake on the prior PET have decreased in size (for example a 3 mm node on series 2, image 76, previously 5 mm). No new enlarged or suspicious lymph nodes are identified in the neck. Vascular: Major vascular structures of the neck are patent. Retropharyngeal course of the proximal ICAs. Limited intracranial: Unremarkable. Visualized orbits: Unremarkable. Mastoids and visualized paranasal sinuses: Minimal mucosal thickening in the maxillary sinuses. Clear mastoid air cells. Skeleton: Innumerable sclerotic lesions throughout the visualized osseous structures. Prior posterior thoracic spine fusion. Upper chest: Reported separately. Other: None. IMPRESSION: 1. Decreased size of small left supraclavicular lymph nodes. 2. No evidence of new cervical lymphadenopathy. 3. Widespread sclerotic bone metastases. Electronically Signed   By: Logan Bores M.D.   On: 08/02/2020 11:28   CT Chest W Contrast  Result Date: 08/02/2020 CLINICAL DATA:  Breast cancer staging, follow-up lung nodule seen on prior exam. EXAM: CT CHEST WITH CONTRAST TECHNIQUE: Multidetector CT imaging of the chest was performed during intravenous contrast administration. CONTRAST:  48m OMNIPAQUE IOHEXOL 300 MG/ML  SOLN COMPARISON:  CT of the chest, abdomen and pelvis of October 31, 2019 FINDINGS: Cardiovascular: Normal heart size. No pericardial effusion. Normal caliber of the thoracic aorta. Scattered atheromatous calcifications in the thoracic aorta. Normal appearance of central pulmonary vasculature on venous phase assessment. Mediastinum/Nodes: No axillary lymphadenopathy. Post LEFT axillary dissection. Tissue expander in the LEFT breast with some skin thickening over the site  similar to the prior exam. RIGHT breast implant in place. No thoracic inlet lymphadenopathy no mediastinal lymphadenopathy no hilar lymphadenopathy Lungs/Pleura: Interval development of mild nodularity and subpleural reticulation along the anterior LEFT upper lobe. No consolidation. No sign of pleural effusion. Airways are patent. Tiny nodule in the LEFT upper lobe is less conspicuous than on the prior study. This measures approximately 2 mm there is some bandlike change extending to the periphery in the LEFT upper lobe likely scarring. Small ground-glass nodule in the anterior LEFT chest on image 35 of series 4 is in the vicinity of but removed from presumed post radiation changes along the anterior LEFT upper lobe. Upper Abdomen: Hepatic steatosis. Low-density lesions in the liver are unchanged largest in the posterior RIGHT hemi liver measuring 2.4 cm with water density. Liver is incompletely imaged. Imaged portions of remaining upper abdominal viscera without acute process. Musculoskeletal: Diffuse bony sclerosis is present. Sclerosis has a somewhat stippled pattern but is diffuse involving all visualized bones. Signs of spinal fusion and spinal curvature, dextroconvexity of the thoracic spine with similar appearance to prior studies. IMPRESSION: 1. Findings are presumably related to post radiation changes. There is a small ground-glass nodule removed from this area it is also likely part of this process, suggest attention on follow-up. 2. Diffuse bony metastatic disease with involvement of all visualized bones. 3. Hepatic steatosis. 4. Low-density lesions in the liver are unchanged largest in the posterior RIGHT hemi liver measuring 2.4 cm with water density. Findings are compatible with cysts and are unchanged. 5. Aortic atherosclerosis. Aortic Atherosclerosis (ICD10-I70.0). Electronically Signed   By: GZetta BillsM.D.   On: 08/02/2020 12:01       ASSESSMENT: 59y.o. [place]  RIGHT BREAST  CANCER (1) status post right breast upper-outer quadrant lumpectomy 07/30/2006 showing atypical ductal hyperplasia.  (2) right breast upper outer quadrant biopsy 11/20/2013  showed atypical ductal hyperplasia   (3) right lumpectomy 02/26/2014 showed ductal carcinoma in situ measuring 0.7 cm, estrogen receptor 100% positive, progesterone receptor 96% positive, with 1 mm margins  (4) right breast lower inner quadrant biopsy 03/29/2014 showed ductal carcinoma in situ, 100% estrogen receptor positive, 100% progesterone receptor positive,  (5) status post right mastectomy with sentinel lymph node sampling showing only atypical ductal hyperplasia    (a) five sentinel lymph nodes removed, all clear  (b) status post right saline implant reconstruction  (6) the patient opted against prophylactic antiestrogens   LEFT BREAST CANCER (7) status post left breast lower outer quadrant biopsy 07/12/2019 for a clinical T1N0, stage IA invasive lobular carcinoma, grade 1, with evidence of perineural invasion, estrogen receptor moderately positive, progesterone receptor negative, HER-2 not amplified, with an MIB-1 of less than 1%  (8) status post left nipple sparing mastectomy 09/27/2019 for a pT3 pN1, stage IIIA invasive lobular carcinoma, grade 2, with a negative nipple biopsy and a focally positive anterior margin  (a) 1 sentinel lymph node had a 2.1 cm tumor deposit; a second positive "lymph node" had no lymph node tissue  (b) immediate expander placement  (c) status post left axillary lymph node dissection and margin excision 11/13/2019, no residual disease   (i) 7 of 7 left axillary lymph nodes negative for carcinoma (total 8 nodes removed)  (d) repeat prognostic panel again estrogen receptor 80% positive with moderate staining, progesterone receptor negative    (9) MammaPrint "low risk" predicts no significant benefit from chemotherapy.   (10) adjuvant radiation 12/26/2019 - 02/06/2020 Site Technique Total  Dose (Gy) Dose per Fx (Gy) Completed Fx Beam Energies  Chest Wall, Left: CW_Lt_Bst Electron 10/10 2 5/5 6E  Sclav-LT: SCV_Lt Complex 50/50 2 25/25 6X, 15X  Sclav-LT: SCV_Lt_Bst Complex 8/8 2 4/4 6X, 15X  Chest Wall, Left: CW_Lt 3D 50/50 2 25/25 6X, 10X, 15X   (11) staging studies:   (a) chest, abdomen and pelvic CT with contrast 10/31/2019 shows mild hepatic steatosis, small uterine fibroids, 0.3 cm left upper lobe nodule, but no evidence of metastatic disease.  (b) F18 estradiol PET scan 11/21/2019 shows a chain of very small lymph nodes from the subpectoralis nodal station to the left supraclavicular nodal station positive for ER accumulation with SUVs in the 4 range (versus mediastinal blood pool 1.4). no evidence of lung, bone, or liver involvement.   (12) tamoxifen started 02/29/2020  (13) genetics testing 06/27/2020 through the CancerNext-Expanded gene panel offered by [facility] found no deleterious mutations in AIP, ALK, APC*, ATM*, AXIN2, BAP1, BARD1, BLM, BMPR1A, BRCA1*, BRCA2*, BRIP1*, CDC73, CDH1*, CDK4, CDKN1B, CDKN2A, CHEK2*, CTNNA1, DICER1, FANCC, FH, FLCN, GALNT12, KIF1B, LZTR1, MAX, MEN1, MET, MLH1*, MSH2*, MSH3, MSH6*, MUTYH*, NBN, NF1*, NF2, NTHL1, PALB2*, PHOX2B, PMS2*, POT1, PRKAR1A, PTCH1, PTEN*, RAD51C*, RAD51D*, RB1, RECQL, RET, SDHA, SDHAF2, SDHB, SDHC, SDHD, SMAD4, SMARCA4, SMARCB1, SMARCE1, STK11, SUFU, TMEM127, TP53*, TSC1, TSC2, VHL and XRCC2 (sequencing and deletion/duplication); EGFR, EGLN1, HOXB13, KIT, MITF, PDGFRA, POLD1, and POLE (sequencing only); EPCAM and GREM1 (deletion/duplication only). DNA and RNA analyses performed for * genes. The report date is 06/27/2020.  (14) reconstruction in process  METASTATIC DISEASE: (15) chest and neck CT with contrast 08/02/2020 shows diffuse bony sclerosis consistent with metastatic disease  (a) CA 27-29 on 08/05/2020 showed  (b) bone marrow biopsy 08/06/2020 showed   PLAN: I discussed the CT scan results with Natasha Graham  at length.  She understands this cancer most consistent with metastatic disease presumably from her breast  cancer.  The pattern of metastasis is unusual but then she does have lobular breast cancer which is unusual and as noted previously very difficult to image.  Recall that her's Aryanna scan did not show any evidence of bone involvement.  We do need to document this pathologically.  We discussed bone marrow biopsy and she is being set up for that tomorrow at 9 AM.  We should have the results by next week and of course if we do document cancer there we will check it for estrogen and progesterone receptors.  We discussed the fact that aortic atherosclerosis and hepatic steatosis are incidental findings not directly related to the primary problem  She has a good understanding of the overall plan.  I am going to tentatively start her on denosumab/Xgeva when she returns to see me [phone] assuming we indeed do document positivity on the bone marrow biopsy  Total encounter time 40 minutes.Sarajane Jews C. Victoriana Aziz, MD 08/05/2020 9:16 AM  Oncology and Hematology [facility] [place], [place] [address] Tel. [phone]  Joylene Igo [phone]   I, Wilburn Mylar, am acting as scribe for Dr. Sarajane Jews C. Karynn Deblasi.  I, Lurline Del MD, have reviewed the above documentation for accuracy and completeness, and I agree with the above.   *Total Encounter Time as defined by the Centers for Medicare and Medicaid Services includes, in addition to the face-to-face time of a patient visit (documented in the note above) non-face-to-face time: obtaining and reviewing outside history, ordering and reviewing medications, tests or procedures, care coordination (communications with other health care professionals or caregivers) and documentation in the medical record.

## 2020-08-05 ENCOUNTER — Other Ambulatory Visit: Payer: BC Managed Care – PPO

## 2020-08-05 ENCOUNTER — Other Ambulatory Visit: Payer: Self-pay

## 2020-08-05 ENCOUNTER — Inpatient Hospital Stay (HOSPITAL_BASED_OUTPATIENT_CLINIC_OR_DEPARTMENT_OTHER): Payer: BC Managed Care – PPO | Admitting: Oncology

## 2020-08-05 VITALS — BP 144/65 | HR 89 | Temp 97.6°F | Resp 18 | Ht 63.0 in | Wt 166.5 lb

## 2020-08-05 DIAGNOSIS — D0511 Intraductal carcinoma in situ of right breast: Secondary | ICD-10-CM | POA: Diagnosis not present

## 2020-08-05 DIAGNOSIS — Z170 Estrogen receptor positive status [ER+]: Secondary | ICD-10-CM | POA: Diagnosis not present

## 2020-08-05 DIAGNOSIS — Z79899 Other long term (current) drug therapy: Secondary | ICD-10-CM | POA: Diagnosis not present

## 2020-08-05 DIAGNOSIS — Z923 Personal history of irradiation: Secondary | ICD-10-CM | POA: Diagnosis not present

## 2020-08-05 DIAGNOSIS — K7689 Other specified diseases of liver: Secondary | ICD-10-CM | POA: Insufficient documentation

## 2020-08-05 DIAGNOSIS — K760 Fatty (change of) liver, not elsewhere classified: Secondary | ICD-10-CM

## 2020-08-05 DIAGNOSIS — Z79810 Long term (current) use of selective estrogen receptor modulators (SERMs): Secondary | ICD-10-CM | POA: Diagnosis not present

## 2020-08-05 DIAGNOSIS — C7951 Secondary malignant neoplasm of bone: Secondary | ICD-10-CM

## 2020-08-05 DIAGNOSIS — C50512 Malignant neoplasm of lower-outer quadrant of left female breast: Secondary | ICD-10-CM

## 2020-08-05 DIAGNOSIS — Z9013 Acquired absence of bilateral breasts and nipples: Secondary | ICD-10-CM | POA: Diagnosis not present

## 2020-08-06 ENCOUNTER — Inpatient Hospital Stay: Payer: BC Managed Care – PPO

## 2020-08-06 ENCOUNTER — Inpatient Hospital Stay (HOSPITAL_BASED_OUTPATIENT_CLINIC_OR_DEPARTMENT_OTHER): Payer: BC Managed Care – PPO | Admitting: Oncology

## 2020-08-06 ENCOUNTER — Other Ambulatory Visit: Payer: Self-pay

## 2020-08-06 ENCOUNTER — Ambulatory Visit (INDEPENDENT_AMBULATORY_CARE_PROVIDER_SITE_OTHER): Payer: BC Managed Care – PPO | Admitting: Surgical

## 2020-08-06 ENCOUNTER — Encounter: Payer: Self-pay | Admitting: Surgical

## 2020-08-06 VITALS — BP 159/83 | HR 86 | Temp 97.8°F | Resp 18

## 2020-08-06 VITALS — BP 154/79 | HR 96 | Temp 98.6°F | Ht 64.0 in | Wt 162.8 lb

## 2020-08-06 DIAGNOSIS — Z9011 Acquired absence of right breast and nipple: Secondary | ICD-10-CM

## 2020-08-06 DIAGNOSIS — Z923 Personal history of irradiation: Secondary | ICD-10-CM | POA: Diagnosis not present

## 2020-08-06 DIAGNOSIS — Z9889 Other specified postprocedural states: Secondary | ICD-10-CM | POA: Diagnosis not present

## 2020-08-06 DIAGNOSIS — C50912 Malignant neoplasm of unspecified site of left female breast: Secondary | ICD-10-CM

## 2020-08-06 DIAGNOSIS — C50512 Malignant neoplasm of lower-outer quadrant of left female breast: Secondary | ICD-10-CM | POA: Diagnosis not present

## 2020-08-06 DIAGNOSIS — Z79899 Other long term (current) drug therapy: Secondary | ICD-10-CM | POA: Diagnosis not present

## 2020-08-06 DIAGNOSIS — C7951 Secondary malignant neoplasm of bone: Secondary | ICD-10-CM

## 2020-08-06 DIAGNOSIS — Z9013 Acquired absence of bilateral breasts and nipples: Secondary | ICD-10-CM | POA: Diagnosis not present

## 2020-08-06 DIAGNOSIS — C7952 Secondary malignant neoplasm of bone marrow: Secondary | ICD-10-CM | POA: Diagnosis not present

## 2020-08-06 DIAGNOSIS — Z170 Estrogen receptor positive status [ER+]: Secondary | ICD-10-CM | POA: Diagnosis not present

## 2020-08-06 DIAGNOSIS — K7689 Other specified diseases of liver: Secondary | ICD-10-CM

## 2020-08-06 DIAGNOSIS — Z79810 Long term (current) use of selective estrogen receptor modulators (SERMs): Secondary | ICD-10-CM | POA: Diagnosis not present

## 2020-08-06 DIAGNOSIS — K760 Fatty (change of) liver, not elsewhere classified: Secondary | ICD-10-CM

## 2020-08-06 DIAGNOSIS — C50919 Malignant neoplasm of unspecified site of unspecified female breast: Secondary | ICD-10-CM | POA: Diagnosis not present

## 2020-08-06 DIAGNOSIS — D0511 Intraductal carcinoma in situ of right breast: Secondary | ICD-10-CM

## 2020-08-06 LAB — CBC WITH DIFFERENTIAL/PLATELET
Abs Immature Granulocytes: 0.01 10*3/uL (ref 0.00–0.07)
Basophils Absolute: 0 10*3/uL (ref 0.0–0.1)
Basophils Relative: 0 %
Eosinophils Absolute: 0 10*3/uL (ref 0.0–0.5)
Eosinophils Relative: 0 %
HCT: 39 % (ref 36.0–46.0)
Hemoglobin: 13.8 g/dL (ref 12.0–15.0)
Immature Granulocytes: 0 %
Lymphocytes Relative: 15 %
Lymphs Abs: 0.7 10*3/uL (ref 0.7–4.0)
MCH: 32.2 pg (ref 26.0–34.0)
MCHC: 35.4 g/dL (ref 30.0–36.0)
MCV: 90.9 fL (ref 80.0–100.0)
Monocytes Absolute: 0.2 10*3/uL (ref 0.1–1.0)
Monocytes Relative: 5 %
Neutro Abs: 3.9 10*3/uL (ref 1.7–7.7)
Neutrophils Relative %: 80 %
Platelets: 266 10*3/uL (ref 150–400)
RBC: 4.29 MIL/uL (ref 3.87–5.11)
RDW: 11.7 % (ref 11.5–15.5)
WBC: 4.9 10*3/uL (ref 4.0–10.5)
nRBC: 0 % (ref 0.0–0.2)

## 2020-08-06 LAB — COMPREHENSIVE METABOLIC PANEL
ALT: 25 U/L (ref 0–44)
AST: 19 U/L (ref 15–41)
Albumin: 3.8 g/dL (ref 3.5–5.0)
Alkaline Phosphatase: 68 U/L (ref 38–126)
Anion gap: 8 (ref 5–15)
BUN: 8 mg/dL (ref 6–20)
CO2: 28 mmol/L (ref 22–32)
Calcium: 9.1 mg/dL (ref 8.9–10.3)
Chloride: 104 mmol/L (ref 98–111)
Creatinine, Ser: 0.81 mg/dL (ref 0.44–1.00)
GFR, Estimated: >60 mL/min (ref >60)
Glucose, Bld: 137 mg/dL — ABNORMAL HIGH (ref 70–99)
Potassium: 3.1 mmol/L — ABNORMAL LOW (ref 3.5–5.1)
Sodium: 140 mmol/L (ref 135–145)
Total Bilirubin: 0.5 mg/dL (ref 0.3–1.2)
Total Protein: 6.8 g/dL (ref 6.5–8.1)

## 2020-08-06 MED ORDER — LIDOCAINE HCL 2 % IJ SOLN
INTRAMUSCULAR | Status: AC
  Filled 2020-08-06: qty 20

## 2020-08-06 NOTE — Patient Instructions (Signed)
Bone Marrow Aspiration and Bone Marrow Biopsy, Adult, Care After This sheet gives you information about how to care for yourself after your procedure. Your health care provider may also give you more specific instructions. If you have problems or questions, contact your health care provider. What can I expect after the procedure? After the procedure, it is common to have:  Mild pain and tenderness.  Swelling.  Bruising. Follow these instructions at home: Puncture site care   Follow instructions from your health care provider about how to take care of the puncture site. Make sure you: ? Wash your hands with soap and water before and after you change your bandage (dressing). If soap and water are not available, use hand sanitizer. ? Change your dressing as told by your health care provider.  Check your puncture site every day for signs of infection. Check for: ? More redness, swelling, or pain. ? Fluid or blood. ? Warmth. ? Pus or a bad smell. Activity  Return to your normal activities as told by your health care provider. Ask your health care provider what activities are safe for you.  Do not lift anything that is heavier than 10 lb (4.5 kg), or the limit that you are told, until your health care provider says that it is safe.  Do not drive for 24 hours if you were given a sedative during your procedure. General instructions   Take over-the-counter and prescription medicines only as told by your health care provider.  Do not take baths, swim, or use a hot tub until your health care provider approves. Ask your health care provider if you may take showers. You may only be allowed to take sponge baths.  If directed, put ice on the affected area. To do this: ? Put ice in a plastic bag. ? Place a towel between your skin and the bag. ? Leave the ice on for 20 minutes, 2-3 times a day.  Keep all follow-up visits as told by your health care provider. This is important. Contact a  health care provider if:  Your pain is not controlled with medicine.  You have a fever.  You have more redness, swelling, or pain around the puncture site.  You have fluid or blood coming from the puncture site.  Your puncture site feels warm to the touch.  You have pus or a bad smell coming from the puncture site. Summary  After the procedure, it is common to have mild pain, tenderness, swelling, and bruising.  Follow instructions from your health care provider about how to take care of the puncture site and what activities are safe for you.  Take over-the-counter and prescription medicines only as told by your health care provider.  Contact a health care provider if you have any signs of infection, such as fluid or blood coming from the puncture site. This information is not intended to replace advice given to you by your health care provider. Make sure you discuss any questions you have with your health care provider. Document Revised: 01/03/2019 Document Reviewed: 01/03/2019 Elsevier Patient Education  2020 [employer]  

## 2020-08-06 NOTE — Progress Notes (Cosign Needed Addendum)
Patient is a 59 year old female who presents today for follow-up/possible preoperative evaluation for her upcoming surgery on 08/21/2020 with Dr. Marla Roe: Removal of tissue expander and placement of left breast implant.  Patient has a history of radiation to her left breast which was completed on 01/2020.  She currently has a right breast saline 600 cc implant in place.  At our previous appointment on 07/23/2020 we discussed various reconstructive options with Dr. Marla Roe and myself present.  At that time patient was recommended to think this over.  Today she reports that she would like to move ahead with removal of the left breast expander and placement of the left breast implant.  Dr. Marla Roe was present during today's conversation and further discussed the plan with the patient.  It was discussed with the patient that if we were to move forward with removal of the left breast expander and placement of left breast implant, we would wait to exchange the right breast saline implant to a smaller implant at another date in case patient had any postoperative wound healing complications/exposure of the implant on the left side due to the history of radiation. Dr. Marla Roe discussed with the patient that ideally she would like to wait at least 9 months prior to exchanging to increase the chances of the patient having less postoperative complications/incisional dehiscence.  Patient initially wanted to have the expander exchanged prior to the year ending as she is expecting a grandchild and wanted to have the surgery prior to the year ending for insurance/deductible reasons.  Chaperone present on exam On exam left breast with irradiated skin noted, left breast skin is firm, it does feel slightly more lax than her previous visit.  No wounds noted.  No cellulitic changes noted.  Incisions intact.   After thorough discussion, patient is in agreement to hold off on exchange of the left breast expander for  implant until March or April 2022.  This will allow for more time for expansion and allow the radiated tissue to be less reactive.  This will also allow [place] to hopefully be able to exchange the right breast implant for a smaller implant at the same time to avoid less surgery.  Patient is wanting to do what is best for her healing process and what would be less likely to cause postoperative complications.  We placed injectable saline in the Expander using a sterile technique: Right: 500 cc saline implant filled with 600 cc of saline per operative report Left: 20 cc for a total of 310 / 455 cc  Recommend patient follow-up in 2 to 3 weeks for reevaluation, potentially an additional fill.

## 2020-08-06 NOTE — Progress Notes (Signed)
INDICATION: metastatic breast cancer, r/o marrow involvement  Brief examination was performed. ENT: adequate airway clearance Heart: regular rate and rhythm.No Murmurs Lungs: clear to auscultation, no wheezes, normal respiratory effort  Bone Marrow Biopsy and Aspiration Procedure Note   Informed consent was obtained and potential risks including bleeding, infection and pain were reviewed with the patient.  The patient's name, date of birth, identification, consent and allergies were verified prior to the start of procedure and time out was performed.  The left posterior iliac crest was chosen as the site of biopsy.  The skin was prepped with ChloraPrep.   8 cc of 2% lidocaine was used to provide local anaesthesia.   10 cc of bone marrow aspirate was obtained followed by 1.8 cm biopsy.  Pressure was applied to the biopsy site and bandage was placed over the biopsy site. Patient was made to lie on the back for 30 mins prior to discharge.  The procedure was tolerated well. COMPLICATIONS: None BLOOD LOSS: none The patient was discharged home in stable condition with a 1 week follow up to review results.  Patient was provided with post bone marrow biopsy instructions and instructed to call if there was any bleeding or worsening pain.  Specimens sent for flow cytometry, cytogenetics and additional studies.  Signed Scot Dock, NP

## 2020-08-06 NOTE — Progress Notes (Signed)
Patient arrived today for bone marrow aspiration. Patient tolerated procedure well. Vitals signs remained stable post procedure and no bleeding noted after 30 minute wait. Patient educated on post procedure care and signs/symptoms for when to call doctor. Patient verbalized understanding and lab came and drew lab work. Pt stable upon discharge.

## 2020-08-07 ENCOUNTER — Telehealth: Payer: Self-pay | Admitting: Oncology

## 2020-08-07 LAB — CANCER ANTIGEN 27.29: CA 27.29: 98.5 U/mL — ABNORMAL HIGH (ref 0.0–38.6)

## 2020-08-07 NOTE — Telephone Encounter (Signed)
Scheduled appts per 12/6 los. Pt confirmed appt date and time.

## 2020-08-08 ENCOUNTER — Other Ambulatory Visit: Payer: Self-pay | Admitting: Oncology

## 2020-08-08 NOTE — Progress Notes (Unsigned)
Dr. Gari Crown called to tell me the bone marrow biopsy is showing metastatic carcinoma consistent with lobular breast cancer ER positive PR negative.  We will see if we can obtain a HER-2 on this but it could be falsely negative because it is decalcified.  Recall the original biopsy November 2020 was HER-2 negative.

## 2020-08-10 ENCOUNTER — Other Ambulatory Visit: Payer: Self-pay | Admitting: Oncology

## 2020-08-10 NOTE — Progress Notes (Signed)
I called Natasha Graham with the bone marrow results.  She sees me 08/15/2020 and we will take it from there.

## 2020-08-14 NOTE — Progress Notes (Signed)
Natasha Graham  Telephone:([phone]) [phone] Fax:(336) [phone]     ID: Natasha Graham DOB: 12/19/1960  MR#: [id number]  LFY#:[id number]  Patient Care Team: Natasha Sake, MD as PCP - General (Family Medicine) Natasha Bookbinder, MD as Consulting Physician (General Surgery) Natasha Graham, Natasha Dad, MD as Consulting Physician (Oncology) Natasha Kaufmann, RN as Registered Nurse Natasha Germany, RN as Registered Nurse Natasha Graham, Natasha Lofty, DO as Attending Physician (Plastic Surgery) Natasha Pray, MD as Consulting Physician (Radiation Oncology) OTHER MD: Natasha Mustache MD   CHIEF COMPLAINT: new estrogen receptor positive left breast cancer; remote noninvasive right breast cancer (s/p bilateral mastectomies)  CURRENT TREATMENT: Denosumab/Xgeva; fulvestrant; palbociclib/Ibrance   INTERVAL HISTORY: Natasha Graham returns today for follow up of her newly diagnosed invasive left breast cancer accompanied by her husband.  Since her last visit, she underwent bone marrow biopsy on 08/06/2020. Pathology from the procedure [id number]) revealed: variably cellular bone marrow with metastatic carcinoma. Prognostic panel showed: estrogen receptor 75% positive with moderate-strong staining intensity; progesterone receptor 0% negative. Her2 equivocal by immunohistochemistry (2+).  She started tamoxifen 02/29/2020. She is tolerating this well.  Hot flashes and vaginal discharge are not an issue.   REVIEW OF SYSTEMS: Natasha Graham has been taking care of her 19-monthold grandchild.  Her daughter is currently having contractions and is expected to deliver a baby girl within the next 24 hours.  This is very exciting.  She has absolutely no symptoms related to her cancer or its treatment.  Detailed review of systems was otherwise benign   COVID 19 VACCINATION STATUS: Patient had antibodies to the virus documented November 2020.  She is not planning on vaccinations   LEFT BREAST CANCER HISTORY: From the  original intake note:  She had routine screening left mammogram on 06/14/2019 showing a developing asymmetry. She presented for left diagnostic mammogram and left breast ultrasound on 07/07/2019. Physical exam that day showed a palpable, firm, superficial mass in the left breast at 6 o'clock along the site of reduction mammoplasty scar (performed in 2015 with right mastectomy). Scans showed: breast density category B; 2.1 cm superficial mass involving the skin in the left breast at 6 o'clock; no enlarged adenopathy in the left axilla.   Accordingly on 07/12/2019 she proceeded to biopsy of the left breast area in question. The pathology from this procedure ([id number]) showed: invasive lobular carcinoma, grade 1, with perineural invasion present. Prognostic indicators significant for: estrogen receptor, 60% positive with moderate staining intensity and progesterone receptor, 0% negative. Proliferation marker Ki67 at <1%. HER2 equvocal by immunohistochemistry (2+), but negative by fluorescent in situ hybridization with a signals ratio 1.18 and number per cell 1.95.  She opted to proceed with left mastectomy on 09/27/2019 under Dr. WDonne Hazelwith immediate reconstruction under Dr. DMarla Roe Pathology from the procedure ([id number] revealed: invasive lobular carcinoma, grade 2, 6.2 cm, focally involving anterior margin and involving skin dermis without involvement of epidermis; lobular carcinoma in situ with pagetoid spread; small focus of low grade ductal carcinoma in situ, 0.2 cm.  Two lymph nodes were biopsied. One showed metastatic lobular carcinoma with focus measuring 2.1 cm with evidence of extranodal extension. The other lymph node also showed metastatic lobular carcinoma with no lymphoid tissue present.  Biopsy of the nipple was also taken at that time and showed no evidence of invasive carcinoma.  Mammaprint was performed on the final surgical sample. This returned showing low risk.  RIGHT  BREAST CANCER HISTORY: Natasha Polinghas a history of right breast biopsy in late 2007  showing atypical ductal hyperplasia in the upper outer right breast.. Breast MRI 07/13/2006 showed postbiopsy changes in the upper right breast, and some right breast cysts. There were no solid masses however or areas of abnormal enhancement. There were no enlarged lymph nodes. Incidental finding of liver cysts was made. On 07/30/2006 she underwent needle localization of the calcifications in the outer right breast which also showed ([id number]) atypical ductal hyperplasia, with no malignancy.   On 10/21/2012 routine digital bilateral screening mammography at the breast Center showed breast density to be category C. In the right breast there were calcifications and architectural distortion to go with skin retraction. Additional views obtained 11/17/2012 showed extensive but stable punctate microcalcifications in the upper outer right breast. Many of these were superficial and not amenable to stereotactic biopsy. MRI was suggested but the patient was unable to undergo that test and therefore six-month mammography was obtained 05/15/2013. This showed the calcifications in the outer right breast to have been stable. Because of their extents and because of their superficial location they could not be all sampled all removed with biopsy. MRI was again discussed, but the patient has Harrington rods in place and also felt the cost of MRI was prohibitive.  On 11/13/2013 further six-month follow-up showed a grouping of intermediate microcalcifications in the lateral right breast and these were biopsied 11/20/2013. The result of that procedure ([id number]) showed atypical ductal hyperplasia. Accordingly on 02/23/2014 the patient underwent right lumpectomy, with the pathology ([id number]) showing ductal carcinoma in situ, grade 2, measuring 0.7 cm. Estrogen receptor was 100% positive. Progesterone receptor was 96% positive. Both showed  strong staining intensity. Margins were clear but the closest margin anteriorly was at 1 mm.  On 03/07/2014 the patient underwent postoperative mammography which showed post lumpectomy changes but also calcifications extending from the lumpectomy site anteriorly towards the nipple. On 03/12/2014 the patient underwent bilateral breast MRI. Breast composition here was described as category B. In the right breast there was a 4.8 cm fluidlike excisional cavity in the lower outer right breast. Adjacent to this was a focus of non-masslike enhancement measuring 1.9 cm. This is an area associated with the scar from the excision in 2007. There was also non-masslike enhancement inferior to the biopsy cavity measuring 3.8 cm. There were no abnormal lymph nodes and no masses or abnormal enhancement in the left breast. Specifically the left breast lower outer quadrant was unchanged from 2007 MRI.  Overall it was felt the linear non-masslike enhancement extending almost 4 cm inferior to the excisional cavity was sufficiently suspicious to warrant biopsy, and this was performed 03/29/2014. It again showed ductal carcinoma in situ, grade 1, estrogen receptor and progesterone receptor both 100% positive with strong staining intensity.  At this point the patient met with surgery and opted for definitive right mastectomy with immediate reconstruction. On 05/16/2014 she underwent simple mastectomy with sentinel lymph node sampling. The pathology ([id number]) showed atypical ductal hyperplasia but no evidence of neoplasia. All 5 sentinel lymph nodes were clear. The patient had immediate right breast reconstruction with tissue expander and dermal matrix placement.  She is scheduled for definitive implant placement 08/08/2014.  Her subsequent history is as detailed below   PAST MEDICAL HISTORY: Past Medical History:  Diagnosis Date   Cancer [facility])    breast cancer   Family history of brain cancer    Family history of  breast cancer    Family history of colon cancer    Family history of stomach cancer  Hypertension    PONV (postoperative nausea and vomiting)    Scoliosis    Wears glasses     PAST SURGICAL HISTORY: Past Surgical History:  Procedure Laterality Date   AUGMENTATION MAMMAPLASTY Right    2015 post mastectomy   AXILLARY LYMPH NODE DISSECTION Left 11/13/2019   Procedure: LEFT AXILLARY LYMPH NODE DISSECTION;  Surgeon: Natasha Bookbinder, MD;  Location: [place];  Service: General;  Laterality: Left;   BREAST RECONSTRUCTION WITH PLACEMENT OF TISSUE EXPANDER AND FLEX HD (ACELLULAR HYDRATED DERMIS) Right 05/16/2014   Procedure: IMMEDIATE RIGHT BREAST RECONSTRUCTION WITH PLACEMENT OF TISSUE EXPANDER AND FLEX HD (ACELLULAR HYDRATED DERMIS);  Surgeon: Theodoro Kos, DO;  Location: [place];  Service: Plastics;  Laterality: Right;   BREAST RECONSTRUCTION WITH PLACEMENT OF TISSUE EXPANDER AND FLEX HD (ACELLULAR HYDRATED DERMIS) Left 09/27/2019   Procedure: LEFT BREAST RECONSTRUCTION WITH PLACEMENT OF TISSUE EXPANDER AND FLEX HD (ACELLULAR HYDRATED DERMIS);  Surgeon: Wallace Going, DO;  Location: [place];  Service: Plastics;  Laterality: Left;   BREAST SURGERY     right breast excisional biopsy   DILATION AND CURETTAGE OF UTERUS     HYSTEROSCOPY WITH D & C N/A 09/20/2015   Procedure: DILATATION AND CURETTAGE /HYSTEROSCOPY;  Surgeon: Brien Few, MD;  Location: [facility];  Service: Gynecology;  Laterality: N/A;   LIPOSUCTION Bilateral 08/08/2014   Procedure: LIPO SUCTION ;  Surgeon: Theodoro Kos, DO;  Location: [place];  Service: Plastics;  Laterality: Bilateral;   MASTECTOMY Right 05/16/2014   placement of acellular dermal matrix & tissue expanders    MASTOPEXY Left 08/08/2014   Procedure:  MASTOPEXY FOR SYMMETRY;  Surgeon: Theodoro Kos, DO;  Location: [place];  Service: Plastics;  Laterality: Left;   NIPPLE  SPARING MASTECTOMY WITH SENTINEL LYMPH NODE BIOPSY Left 09/27/2019   Procedure: LEFT NIPPLE SPARING MASTECTOMY WITH LEFT AXILLARY SENTINEL LYMPH NODE BIOPSY;  Surgeon: Natasha Bookbinder, MD;  Location: [place];  Service: General;  Laterality: Left;   REDUCTION MAMMAPLASTY Left    2015   REMOVAL OF TISSUE EXPANDER AND PLACEMENT OF IMPLANT Right 08/08/2014   Procedure: REMOVAL OF RIGHT  TISSUE EXPANDERS WITH PLACEMENT OF RIGHT BREAST IMPLANTS WITH LIPO SUCTION ;  Surgeon: Theodoro Kos, DO;  Location: [place];  Service: Plastics;  Laterality: Right;   SCAR REVISION Left 11/13/2019   Procedure: EXCISION OF LEFT MASTECTOMY SKIN;  Surgeon: Natasha Bookbinder, MD;  Location: [place];  Service: General;  Laterality: Left;   scoliosis  1972   harrington rods-age 62   TONSILLECTOMY      FAMILY HISTORY Family History  Problem Relation Age of Onset   Heart disease Mother    Cancer Father        liver   Colon cancer Father 32   Head & neck cancer Father        oral cancer - dx in 40s-50s   Breast cancer Paternal Aunt        dx <50   Brain cancer Brother        possible meningioma   Cancer Maternal Aunt        liver/lung cancer   Stomach cancer Paternal Uncle    Heart disease Maternal Grandmother    Lymphoma Maternal Grandfather        NHL   Heart attack Paternal Grandfather    Cancer Paternal Uncle        NOS   there  is significant cancer history on the paternal side, her father dying at the age of 77 with what the patient tells me was metastatic head and neck cancer (but also involving the colon and liver). One of the patient's father's brothers had stomach cancer, and another had a brain tumor, and 2 of the patient's father's sisters had breast cancer, 1 diagnosed at age 81, the other at age 80.  On the mother's side there is a history of non-Hodgkin's lymphoma at age 73. There is no history of ovarian cancer in the  family   GYNECOLOGIC HISTORY:  Patient's last menstrual period was 10/16/2013.  menarche age 64, first live birth age 57. The patient is GX P3. She stopped having periods in October 2014. She did not take hormone replacement. She did take birth control pills for approximately 1 year remotely, with no complications.   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 She works as an Web designer. Son Natasha Graham drives a truck for the family business. Daughter Natasha Graham is a Forensic psychologist and currently works as an IT trainer in [place].  Daughter Natasha Graham is studying history and English at [employer]. The patient has 2 granddaughters aged 68 and 1 as of May 2021. She attends [employer] [place] DIRECTIVES: In the absence of any documents to the contrary the patient's husband is her healthcare power of attorney   HEALTH MAINTENANCE: Social History   Tobacco Use   Smoking status: Never Smoker   Smokeless tobacco: Never Used  Vaping Use   Vaping Use: Never used  Substance Use Topics   Alcohol use: No   Drug use: No     Colonoscopy:  2013  PAP: March 2015  Bone density:  Lipid panel:  No Known Allergies  Current Outpatient Medications  Medication Sig Dispense Refill   acetaminophen (TYLENOL) 500 MG tablet Take by mouth.     Ascorbic Acid (VITAMIN C) 100 MG tablet Take 100 mg by mouth daily.     Flaxseed Oil (LINSEED OIL) OIL by Misc.(Non-Drug; Combo Route) route.     Ginger, Zingiber officinalis, (GINGER PO) Take by mouth.     ibuprofen (ADVIL) 200 MG tablet Take by mouth.     lisinopril-hydrochlorothiazide (PRINZIDE,ZESTORETIC) 20-25 MG per tablet Take 1 tablet by mouth every evening.      Magnesium 250 MG TABS Take by mouth.     Multiple Vitamin (MULTIVITAMIN) capsule Take by mouth.     tamoxifen (NOLVADEX) 20 MG tablet Take 20 mg by mouth daily.     Turmeric (QC  TUMERIC COMPLEX) 500 MG CAPS Take by mouth.     No current facility-administered medications for this visit.    OBJECTIVE: White woman who appears younger than stated age 30:   08/15/20 0832  BP: (!) 161/69  Pulse: (!) 110  Resp: 18  Temp: 98.2 F (36.8 C)  SpO2: 100%     Body mass index is 28.01 kg/m.    ECOG FS:1 - Symptomatic but completely ambulatory  Sclerae unicteric, EOMs intact Wearing a mask No cervical or supraclavicular adenopathy Lungs no rales or rhonchi Heart regular rate and rhythm Abd soft, nontender, positive bowel sounds MSK no focal spinal tenderness, no upper extremity lymphedema Neuro: nonfocal, well oriented, appropriate affect Breasts: Deferred   LAB RESULTS:  CMP     Component Value Date/Time   NA 140 08/06/2020 0950   NA 143 07/30/2014  1603   K 3.1 (L) 08/06/2020 0950   K 3.5 07/30/2014 1603   CL 104 08/06/2020 0950   CO2 28 08/06/2020 0950   CO2 30 (H) 07/30/2014 1603   GLUCOSE 137 (H) 08/06/2020 0950   GLUCOSE 93 07/30/2014 1603   BUN 8 08/06/2020 0950   BUN 10.5 07/30/2014 1603   CREATININE 0.81 08/06/2020 0950   CREATININE 0.9 07/30/2014 1603   CALCIUM 9.1 08/06/2020 0950   CALCIUM 9.5 07/30/2014 1603   PROT 6.8 08/06/2020 0950   PROT 7.0 07/30/2014 1603   ALBUMIN 3.8 08/06/2020 0950   ALBUMIN 4.0 07/30/2014 1603   AST 19 08/06/2020 0950   AST 23 07/30/2014 1603   ALT 25 08/06/2020 0950   ALT 23 07/30/2014 1603   ALKPHOS 68 08/06/2020 0950   ALKPHOS 78 07/30/2014 1603   BILITOT 0.5 08/06/2020 0950   BILITOT 0.29 07/30/2014 1603   GFRNONAA >60 08/06/2020 0950   GFRAA >60 04/29/2020 0829    INo results found for: SPEP, UPEP  Lab Results  Component Value Date   WBC 4.9 08/06/2020   NEUTROABS 3.9 08/06/2020   HGB 13.8 08/06/2020   HCT 39.0 08/06/2020   MCV 90.9 08/06/2020   PLT 266 08/06/2020      Chemistry      Component Value Date/Time   NA 140 08/06/2020 0950   NA 143 07/30/2014 1603   K 3.1 (L)  08/06/2020 0950   K 3.5 07/30/2014 1603   CL 104 08/06/2020 0950   CO2 28 08/06/2020 0950   CO2 30 (H) 07/30/2014 1603   BUN 8 08/06/2020 0950   BUN 10.5 07/30/2014 1603   CREATININE 0.81 08/06/2020 0950   CREATININE 0.9 07/30/2014 1603      Component Value Date/Time   CALCIUM 9.1 08/06/2020 0950   CALCIUM 9.5 07/30/2014 1603   ALKPHOS 68 08/06/2020 0950   ALKPHOS 78 07/30/2014 1603   AST 19 08/06/2020 0950   AST 23 07/30/2014 1603   ALT 25 08/06/2020 0950   ALT 23 07/30/2014 1603   BILITOT 0.5 08/06/2020 0950   BILITOT 0.29 07/30/2014 1603      No results found for: LABCA2  No components found for: LABCA125  No results for input(s): INR in the last 168 hours.  Urinalysis No results found for: COLORURINE, APPEARANCEUR, LABSPEC, PHURINE, GLUCOSEU, HGBUR, BILIRUBINUR, KETONESUR, PROTEINUR, UROBILINOGEN, NITRITE, LEUKOCYTESUR   STUDIES: CT Soft Tissue Neck W Contrast  Result Date: 08/02/2020 CLINICAL DATA:  Lymphadenopathy.  History of breast cancer. EXAM: CT NECK WITH CONTRAST TECHNIQUE: Multidetector CT imaging of the neck was performed using the standard protocol following the bolus administration of intravenous contrast. CONTRAST:  19m OMNIPAQUE IOHEXOL 300 MG/ML  SOLN COMPARISON:  PET-CT 11/20/2019 FINDINGS: Pharynx and larynx: No evidence of mass or swelling. Salivary glands: No inflammation, mass, or stone. Thyroid: Unremarkable. Lymph nodes: 5 mm short axis left level II lymph nodes are unchanged in size and did not demonstrate abnormal uptake on the prior PET. Small left supraclavicular lymph nodes with abnormal uptake on the prior PET have decreased in size (for example a 3 mm node on series 2, image 76, previously 5 mm). No new enlarged or suspicious lymph nodes are identified in the neck. Vascular: Major vascular structures of the neck are patent. Retropharyngeal course of the proximal ICAs. Limited intracranial: Unremarkable. Visualized orbits: Unremarkable. Mastoids  and visualized paranasal sinuses: Minimal mucosal thickening in the maxillary sinuses. Clear mastoid air cells. Skeleton: Innumerable sclerotic lesions throughout the visualized osseous  structures. Prior posterior thoracic spine fusion. Upper chest: Reported separately. Other: None. IMPRESSION: 1. Decreased size of small left supraclavicular lymph nodes. 2. No evidence of new cervical lymphadenopathy. 3. Widespread sclerotic bone metastases. Electronically Signed   By: Logan Bores M.D.   On: 08/02/2020 11:28   CT Chest W Contrast  Result Date: 08/02/2020 CLINICAL DATA:  Breast cancer staging, follow-up lung nodule seen on prior exam. EXAM: CT CHEST WITH CONTRAST TECHNIQUE: Multidetector CT imaging of the chest was performed during intravenous contrast administration. CONTRAST:  26m OMNIPAQUE IOHEXOL 300 MG/ML  SOLN COMPARISON:  CT of the chest, abdomen and pelvis of October 31, 2019 FINDINGS: Cardiovascular: Normal heart size. No pericardial effusion. Normal caliber of the thoracic aorta. Scattered atheromatous calcifications in the thoracic aorta. Normal appearance of central pulmonary vasculature on venous phase assessment. Mediastinum/Nodes: No axillary lymphadenopathy. Post LEFT axillary dissection. Tissue expander in the LEFT breast with some skin thickening over the site similar to the prior exam. RIGHT breast implant in place. No thoracic inlet lymphadenopathy no mediastinal lymphadenopathy no hilar lymphadenopathy Lungs/Pleura: Interval development of mild nodularity and subpleural reticulation along the anterior LEFT upper lobe. No consolidation. No sign of pleural effusion. Airways are patent. Tiny nodule in the LEFT upper lobe is less conspicuous than on the prior study. This measures approximately 2 mm there is some bandlike change extending to the periphery in the LEFT upper lobe likely scarring. Small ground-glass nodule in the anterior LEFT chest on image 35 of series 4 is in the vicinity of but  removed from presumed post radiation changes along the anterior LEFT upper lobe. Upper Abdomen: Hepatic steatosis. Low-density lesions in the liver are unchanged largest in the posterior RIGHT hemi liver measuring 2.4 cm with water density. Liver is incompletely imaged. Imaged portions of remaining upper abdominal viscera without acute process. Musculoskeletal: Diffuse bony sclerosis is present. Sclerosis has a somewhat stippled pattern but is diffuse involving all visualized bones. Signs of spinal fusion and spinal curvature, dextroconvexity of the thoracic spine with similar appearance to prior studies. IMPRESSION: 1. Findings are presumably related to post radiation changes. There is a small ground-glass nodule removed from this area it is also likely part of this process, suggest attention on follow-up. 2. Diffuse bony metastatic disease with involvement of all visualized bones. 3. Hepatic steatosis. 4. Low-density lesions in the liver are unchanged largest in the posterior RIGHT hemi liver measuring 2.4 cm with water density. Findings are compatible with cysts and are unchanged. 5. Aortic atherosclerosis. Aortic Atherosclerosis (ICD10-I70.0). Electronically Signed   By: GZetta BillsM.D.   On: 08/02/2020 12:01       ASSESSMENT: 59y.o. [place]  RIGHT BREAST CANCER (1) status post right breast upper-outer quadrant lumpectomy 07/30/2006 showing atypical ductal hyperplasia.  (2) right breast upper outer quadrant biopsy 11/20/2013 showed atypical ductal hyperplasia   (3) right lumpectomy 02/26/2014 showed ductal carcinoma in situ measuring 0.7 cm, estrogen receptor 100% positive, progesterone receptor 96% positive, with 1 mm margins  (4) right breast lower inner quadrant biopsy 03/29/2014 showed ductal carcinoma in situ, 100% estrogen receptor positive, 100% progesterone receptor positive,  (5) status post right mastectomy with sentinel lymph node sampling showing only atypical ductal  hyperplasia    (a) five sentinel lymph nodes removed, all clear  (b) status post right saline implant reconstruction  (6) the patient opted against prophylactic antiestrogens   LEFT BREAST CANCER (7) status post left breast lower outer quadrant biopsy 07/12/2019 for a clinical  T1N0, stage IA invasive lobular carcinoma, grade 1, with evidence of perineural invasion, estrogen receptor moderately positive, progesterone receptor negative, HER-2 not amplified, with an MIB-1 of less than 1%  (8) status post left nipple sparing mastectomy 09/27/2019 for a pT3 pN1, stage IIIA invasive lobular carcinoma, grade 2, with a negative nipple biopsy and a focally positive anterior margin  (a) 1 sentinel lymph node had a 2.1 cm tumor deposit; a second positive "lymph node" had no lymph node tissue  (b) immediate expander placement  (c) status post left axillary lymph node dissection and margin excision 11/13/2019, no residual disease   (i) 7 of 7 left axillary lymph nodes negative for carcinoma (total 8 nodes removed)  (d) repeat prognostic panel again estrogen receptor 80% positive with moderate staining, progesterone receptor negative    (9) MammaPrint "low risk" predicts no significant benefit from chemotherapy.   (10) adjuvant radiation 12/26/2019 - 02/06/2020 Site Technique Total Dose (Gy) Dose per Fx (Gy) Completed Fx Beam Energies  Chest Wall, Left: CW_Lt_Bst Electron 10/10 2 5/5 6E  Sclav-LT: SCV_Lt Complex 50/50 2 25/25 6X, 15X  Sclav-LT: SCV_Lt_Bst Complex 8/8 2 4/4 6X, 15X  Chest Wall, Left: CW_Lt 3D 50/50 2 25/25 6X, 10X, 15X   (11) staging studies:   (a) chest, abdomen and pelvic CT with contrast 10/31/2019 shows mild hepatic steatosis, small uterine fibroids, 0.3 cm left upper lobe nodule, but no evidence of metastatic disease.  (b) F18 estradiol PET scan 11/21/2019 shows a chain of very small lymph nodes from the subpectoralis nodal station to the left supraclavicular nodal station positive for  ER accumulation with SUVs in the 4 range (versus mediastinal blood pool 1.4). no evidence of lung, bone, or liver involvement.   (12) tamoxifen started 02/29/2020  (13) genetics testing 06/27/2020 through the CancerNext-Expanded gene panel offered by [facility] found no deleterious mutations in AIP, ALK, APC*, ATM*, AXIN2, BAP1, BARD1, BLM, BMPR1A, BRCA1*, BRCA2*, BRIP1*, CDC73, CDH1*, CDK4, CDKN1B, CDKN2A, CHEK2*, CTNNA1, DICER1, FANCC, FH, FLCN, GALNT12, KIF1B, LZTR1, MAX, MEN1, MET, MLH1*, MSH2*, MSH3, MSH6*, MUTYH*, NBN, NF1*, NF2, NTHL1, PALB2*, PHOX2B, PMS2*, POT1, PRKAR1A, PTCH1, PTEN*, RAD51C*, RAD51D*, RB1, RECQL, RET, SDHA, SDHAF2, SDHB, SDHC, SDHD, SMAD4, SMARCA4, SMARCB1, SMARCE1, STK11, SUFU, TMEM127, TP53*, TSC1, TSC2, VHL and XRCC2 (sequencing and deletion/duplication); EGFR, EGLN1, HOXB13, KIT, MITF, PDGFRA, POLD1, and POLE (sequencing only); EPCAM and GREM1 (deletion/duplication only). DNA and RNA analyses performed for * genes. The report date is 06/27/2020.  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  (16) denosumab/Xgeva started 08/15/2020  (17) fulvestrant started  (a) palbociclib to be added 09/12/2020   PLAN: I went over the bone marrow results with Natasha Graham.  She understands this is the same cancer that we documented from her breast.  It is not bone cancer but breast cancer in the bone.  It has the same prognostic parameters as her previous biopsy, namely estrogen receptor positive and progesterone receptor negative.  This cancer grew through tamoxifen and therefore we are stopping that medication.  She understands that stage IV breast  cancer is not curable with our current knowledge base. The goal of treatment is control. The strategy of treatment is to do only the minimum necessary to control the growth of  the tumor so that the patient can have as normal a life as possible. There is no survival advantage in treating aggressively if treating less aggressively results in tumor control. With this strategy stage IV breast cancer in many cases can function as a "chronic illness": something that cannot be quite gotten rid of but can be controlled for an indefinite period of time  We are going to treat this with fulvestrant, palbociclib, and denosumab/Xgeva.  To the [place] See will start today.  If possible we will start the Faslodex today as well.  If she does not receive the first dose today she will receive it on 08/29/2020.  We discussed the possible toxicities side effects and complications of all these agents including the rare case of osteonecrosis of the jaw with denosumab and she has been advised to work with her dentist to maintain optimal dental health and to avoid any extractions or implants.  I do not want to start the palbociclib until she sees me again 09/13/2019, so that we have everything else in place and we have been able to monitor her side effects from each separate treatment.  Her CA 27-29 is moderately informative and we will follow this on a monthly basis  Total encounter time 45 minutes.Sarajane Jews C. Nickey Canedo, MD 08/15/2020 9:11 AM  Oncology and Hematology [facility] [place], [place] [address] Tel. [phone]  Joylene Igo [phone]   I, Wilburn Mylar, am acting as scribe for Dr. Sarajane Jews C. Dent Plantz.  I, Lurline Del MD, have reviewed the above documentation for accuracy and completeness, and I agree with the above.   *Total Encounter Time as defined by the Centers for Medicare and Medicaid Services includes, in addition to the face-to-face time of a patient visit (documented in  the note above) non-face-to-face time: obtaining and reviewing outside history, ordering and reviewing medications, tests or procedures, care coordination (communications with other health care professionals or caregivers) and documentation in the medical record.

## 2020-08-15 ENCOUNTER — Telehealth: Payer: Self-pay

## 2020-08-15 ENCOUNTER — Telehealth: Payer: Self-pay | Admitting: Pharmacist

## 2020-08-15 ENCOUNTER — Other Ambulatory Visit: Payer: Self-pay | Admitting: Oncology

## 2020-08-15 ENCOUNTER — Inpatient Hospital Stay (HOSPITAL_BASED_OUTPATIENT_CLINIC_OR_DEPARTMENT_OTHER): Payer: BC Managed Care – PPO | Admitting: Oncology

## 2020-08-15 ENCOUNTER — Other Ambulatory Visit: Payer: Self-pay

## 2020-08-15 ENCOUNTER — Inpatient Hospital Stay: Payer: BC Managed Care – PPO

## 2020-08-15 ENCOUNTER — Telehealth: Payer: Self-pay | Admitting: Oncology

## 2020-08-15 VITALS — BP 161/69 | HR 110 | Temp 98.2°F | Resp 18 | Ht 64.0 in | Wt 163.2 lb

## 2020-08-15 DIAGNOSIS — Z79810 Long term (current) use of selective estrogen receptor modulators (SERMs): Secondary | ICD-10-CM | POA: Diagnosis not present

## 2020-08-15 DIAGNOSIS — C50512 Malignant neoplasm of lower-outer quadrant of left female breast: Secondary | ICD-10-CM

## 2020-08-15 DIAGNOSIS — Z170 Estrogen receptor positive status [ER+]: Secondary | ICD-10-CM

## 2020-08-15 DIAGNOSIS — C7951 Secondary malignant neoplasm of bone: Secondary | ICD-10-CM

## 2020-08-15 DIAGNOSIS — I700 Atherosclerosis of aorta: Secondary | ICD-10-CM | POA: Diagnosis not present

## 2020-08-15 DIAGNOSIS — K760 Fatty (change of) liver, not elsewhere classified: Secondary | ICD-10-CM | POA: Diagnosis not present

## 2020-08-15 DIAGNOSIS — Z9013 Acquired absence of bilateral breasts and nipples: Secondary | ICD-10-CM | POA: Diagnosis not present

## 2020-08-15 DIAGNOSIS — Z923 Personal history of irradiation: Secondary | ICD-10-CM | POA: Diagnosis not present

## 2020-08-15 DIAGNOSIS — Z79899 Other long term (current) drug therapy: Secondary | ICD-10-CM | POA: Diagnosis not present

## 2020-08-15 MED ORDER — FULVESTRANT 250 MG/5ML IM SOLN
INTRAMUSCULAR | Status: AC
  Filled 2020-08-15: qty 10

## 2020-08-15 MED ORDER — FULVESTRANT 250 MG/5ML IM SOLN
500.0000 mg | Freq: Once | INTRAMUSCULAR | Status: AC
  Administered 2020-08-15: 500 mg via INTRAMUSCULAR

## 2020-08-15 MED ORDER — PALBOCICLIB 125 MG PO TABS: 125.0000 mg | ORAL_TABLET | Freq: Every day | ORAL | 6 refills | Status: DC

## 2020-08-15 MED ORDER — DENOSUMAB 120 MG/1.7ML ~~LOC~~ SOLN
120.0000 mg | Freq: Once | SUBCUTANEOUS | Status: AC
  Administered 2020-08-15: 120 mg via SUBCUTANEOUS

## 2020-08-15 MED ORDER — PALBOCICLIB 125 MG PO CAPS: 125.0000 mg | ORAL_CAPSULE | Freq: Every day | ORAL | 6 refills | Status: DC

## 2020-08-15 MED ORDER — DENOSUMAB 120 MG/1.7ML ~~LOC~~ SOLN
SUBCUTANEOUS | Status: AC
  Filled 2020-08-15: qty 1.7

## 2020-08-15 NOTE — Telephone Encounter (Signed)
Scheduled appts per 12/16 los. Gave pt a print out of AVS.

## 2020-08-15 NOTE — Telephone Encounter (Signed)
Oral Oncology Patient Advocate Encounter  Prior Authorization for Natasha Graham has been approved.    PA# [id number] Effective dates: 08/15/20 through 08/14/21  Patients co-pay is $0  Oral Oncology Clinic will continue to follow.    [place] Patient [name] Phone [phone] Fax [phone] 08/15/2020 2:49 PM

## 2020-08-15 NOTE — Telephone Encounter (Signed)
Oral Oncology Patient Advocate Encounter  Received notification from [facility] that prior authorization for Natasha Graham is required.  PA submitted on CoverMyMeds Key [id number] Status is pending  Oral Oncology Clinic will continue to follow.  [name] Phone [phone] Fax [phone] 08/15/2020 10:05 AM

## 2020-08-15 NOTE — Patient Instructions (Signed)
Denosumab injection What is this medicine? DENOSUMAB (den oh sue mab) slows bone breakdown. Prolia is used to treat osteoporosis in women after menopause and in men, and in people who are taking corticosteroids for 6 months or more. Xgeva is used to treat a high calcium level due to cancer and to prevent bone fractures and other bone problems caused by multiple myeloma or cancer bone metastases. Xgeva is also used to treat giant cell tumor of the bone. This medicine may be used for other purposes; ask your health care provider or pharmacist if you have questions. COMMON BRAND NAME(S): Prolia, XGEVA What should I tell my health care provider before I take this medicine? They need to know if you have any of these conditions:  dental disease  having surgery or tooth extraction  infection  kidney disease  low levels of calcium or Vitamin D in the blood  malnutrition  on hemodialysis  skin conditions or sensitivity  thyroid or parathyroid disease  an unusual reaction to denosumab, other medicines, foods, dyes, or preservatives  pregnant or trying to get pregnant  breast-feeding How should I use this medicine? This medicine is for injection under the skin. It is given by a health care professional in a hospital or clinic setting. A special MedGuide will be given to you before each treatment. Be sure to read this information carefully each time. For Prolia, talk to your pediatrician regarding the use of this medicine in children. Special care may be needed. For Xgeva, talk to your pediatrician regarding the use of this medicine in children. While this drug may be prescribed for children as young as 13 years for selected conditions, precautions do apply. Overdosage: If you think you have taken too much of this medicine contact a poison control center or emergency room at once. NOTE: This medicine is only for you. Do not share this medicine with others. What if I miss a dose? It is  important not to miss your dose. Call your doctor or health care professional if you are unable to keep an appointment. What may interact with this medicine? Do not take this medicine with any of the following medications:  other medicines containing denosumab This medicine may also interact with the following medications:  medicines that lower your chance of fighting infection  steroid medicines like prednisone or cortisone This list may not describe all possible interactions. Give your health care provider a list of all the medicines, herbs, non-prescription drugs, or dietary supplements you use. Also tell them if you smoke, drink alcohol, or use illegal drugs. Some items may interact with your medicine. What should I watch for while using this medicine? Visit your doctor or health care professional for regular checks on your progress. Your doctor or health care professional may order blood tests and other tests to see how you are doing. Call your doctor or health care professional for advice if you get a fever, chills or sore throat, or other symptoms of a cold or flu. Do not treat yourself. This drug may decrease your body's ability to fight infection. Try to avoid being around people who are sick. You should make sure you get enough calcium and vitamin D while you are taking this medicine, unless your doctor tells you not to. Discuss the foods you eat and the vitamins you take with your health care professional. See your dentist regularly. Brush and floss your teeth as directed. Before you have any dental work done, tell your dentist you are   receiving this medicine. Do not become pregnant while taking this medicine or for 5 months after stopping it. Talk with your doctor or health care professional about your birth control options while taking this medicine. Women should inform their doctor if they wish to become pregnant or think they might be pregnant. There is a potential for serious side  effects to an unborn child. Talk to your health care professional or pharmacist for more information. What side effects may I notice from receiving this medicine? Side effects that you should report to your doctor or health care professional as soon as possible:  allergic reactions like skin rash, itching or hives, swelling of the face, lips, or tongue  bone pain  breathing problems  dizziness  jaw pain, especially after dental work  redness, blistering, peeling of the skin  signs and symptoms of infection like fever or chills; cough; sore throat; pain or trouble passing urine  signs of low calcium like fast heartbeat, muscle cramps or muscle pain; pain, tingling, numbness in the hands or feet; seizures  unusual bleeding or bruising  unusually weak or tired Side effects that usually do not require medical attention (report to your doctor or health care professional if they continue or are bothersome):  constipation  diarrhea  headache  joint pain  loss of appetite  muscle pain  runny nose  tiredness  upset stomach This list may not describe all possible side effects. Call your doctor for medical advice about side effects. You may report side effects to FDA at 1-800-FDA-1088. Where should I keep my medicine? This medicine is only given in a clinic, doctor's office, or other health care setting and will not be stored at home. NOTE: This sheet is a summary. It may not cover all possible information. If you have questions about this medicine, talk to your doctor, pharmacist, or health care provider.  2020 Elsevier/Gold Standard (2017-12-24 16:10:44) Fulvestrant injection What is this medicine? FULVESTRANT (ful VES trant) blocks the effects of estrogen. It is used to treat breast cancer. This medicine may be used for other purposes; ask your health care provider or pharmacist if you have questions. COMMON BRAND NAME(S): FASLODEX What should I tell my health care  provider before I take this medicine? They need to know if you have any of these conditions:  bleeding disorders  liver disease  low blood counts, like low white cell, platelet, or red cell counts  an unusual or allergic reaction to fulvestrant, other medicines, foods, dyes, or preservatives  pregnant or trying to get pregnant  breast-feeding How should I use this medicine? This medicine is for injection into a muscle. It is usually given by a health care professional in a hospital or clinic setting. Talk to your pediatrician regarding the use of this medicine in children. Special care may be needed. Overdosage: If you think you have taken too much of this medicine contact a poison control center or emergency room at once. NOTE: This medicine is only for you. Do not share this medicine with others. What if I miss a dose? It is important not to miss your dose. Call your doctor or health care professional if you are unable to keep an appointment. What may interact with this medicine?  medicines that treat or prevent blood clots like warfarin, enoxaparin, dalteparin, apixaban, dabigatran, and rivaroxaban This list may not describe all possible interactions. Give your health care provider a list of all the medicines, herbs, non-prescription drugs, or dietary supplements you use.   Also tell them if you smoke, drink alcohol, or use illegal drugs. Some items may interact with your medicine. What should I watch for while using this medicine? Your condition will be monitored carefully while you are receiving this medicine. You will need important blood work done while you are taking this medicine. Do not become pregnant while taking this medicine or for at least 1 year after stopping it. Women of child-bearing potential will need to have a negative pregnancy test before starting this medicine. Women should inform their doctor if they wish to become pregnant or think they might be pregnant. There is  a potential for serious side effects to an unborn child. Men should inform their doctors if they wish to father a child. This medicine may lower sperm counts. Talk to your health care professional or pharmacist for more information. Do not breast-feed an infant while taking this medicine or for 1 year after the last dose. What side effects may I notice from receiving this medicine? Side effects that you should report to your doctor or health care professional as soon as possible:  allergic reactions like skin rash, itching or hives, swelling of the face, lips, or tongue  feeling faint or lightheaded, falls  pain, tingling, numbness, or weakness in the legs  signs and symptoms of infection like fever or chills; cough; flu-like symptoms; sore throat  vaginal bleeding Side effects that usually do not require medical attention (report to your doctor or health care professional if they continue or are bothersome):  aches, pains  constipation  diarrhea  headache  hot flashes  nausea, vomiting  pain at site where injected  stomach pain This list may not describe all possible side effects. Call your doctor for medical advice about side effects. You may report side effects to FDA at 1-800-FDA-1088. Where should I keep my medicine? This drug is given in a hospital or clinic and will not be stored at home. NOTE: This sheet is a summary. It may not cover all possible information. If you have questions about this medicine, talk to your doctor, pharmacist, or health care provider.  2020 Elsevier/Gold Standard (2017-11-25 11:34:41)  

## 2020-08-19 ENCOUNTER — Other Ambulatory Visit (HOSPITAL_COMMUNITY): Payer: BC Managed Care – PPO

## 2020-08-19 NOTE — Telephone Encounter (Addendum)
Oral Oncology Pharmacist Encounter  Received new prescription for Ibrance (palbociclib) for the treatment of metastatic ER positive/PR negative, HER-2 negative in conjunction with fulvestrant, planned duration until disease progression or unacceptable drug toxicity.  Prescription dose and frequency assessed for appropriateness. Planned start date for therapy: 09/12/20.  CBC w/ Diff and CMP from 08/06/20 assessed, no baseline dose adjustment required.  Current medication list in Epic reviewed, no relevant/significant DDIs with Ibrance identified.  Evaluated chart and no patient barriers to medication adherence noted.   Prescription has been e-scribed to the [facility] for benefits analysis and approval.  Oral Oncology Clinic will continue to follow for insurance authorization, copayment issues, initial counseling and start date.  Leron Croak, PharmD, BCPS Hematology/Oncology Clinical Pharmacist [facility] [phone] 08/21/2020 2:50 PM

## 2020-08-20 NOTE — Progress Notes (Signed)
Patient is a 60 year old female here for follow-up after left nipple sparing mastectomy with Dr. Donne Hazel and immediate reconstruction with placement of tissue expander with Dr. Marla Roe on 09/27/2019.  Margins were not clear and she underwent reexcision of left breast margins and axillary node dissection on 11/14/2019.  She had prior right side breast cancer (2015) and currently has a right breast saline 600 cc implant in place.  She then had left side breast cancer and underwent cystectomy with placement of tissue expander.  She has a history of radiation to her left breast which was completed in June 2021.  Plan was to hold off and exchange of left breast expander for implant until March or April 2022 to allow more time for expansion and for healing of the radiated tissue.  Patient would like to exchange the right breast implant for a smaller one at the time of left breast tissue expander exchange.  Left breast tissue expander currently has 310 cc of saline. Right breast has 500 cc saline implant filled with 600 cc of saline per operative report.  On 12/16 she met with Dr. Jana Hakim, oncology, to discuss her newly diagnosed metastatic disease. Bone marrow biopsy 08/06/20 showed metastatic carcinoma, estrogen receptor positive, progesterone receptor negative.  This is metastasis of her breast cancer. Plan to treat with fulvestrant, palbociclib, and denosumab/Xgeva.  Today patient reports she is doing well.  She has a new grandbaby who was born last week that she is very excited about.  Denies fever/chills, nausea/vomiting, pain.  She reports she did well with her last fill.  Reports plan was to continue to fill slowly if possible.  Skin on the left breast has limited laxity today so we did not do a fill today.  Recommend follow-up in 3 to 4 weeks with Dr. Marla Roe.  Return precautions provided.  Call office with any questions/concerns.  We placed injectable saline in the Expander using a sterile  technique: Left: 0 cc for a total of 310 / 455 cc

## 2020-08-21 ENCOUNTER — Ambulatory Visit (HOSPITAL_BASED_OUTPATIENT_CLINIC_OR_DEPARTMENT_OTHER): Admit: 2020-08-21 | Payer: BC Managed Care – PPO | Admitting: Plastic Surgery

## 2020-08-21 ENCOUNTER — Other Ambulatory Visit: Payer: Self-pay

## 2020-08-21 ENCOUNTER — Encounter (HOSPITAL_BASED_OUTPATIENT_CLINIC_OR_DEPARTMENT_OTHER): Payer: Self-pay

## 2020-08-21 ENCOUNTER — Ambulatory Visit (INDEPENDENT_AMBULATORY_CARE_PROVIDER_SITE_OTHER): Payer: BC Managed Care – PPO | Admitting: Plastic Surgery

## 2020-08-21 ENCOUNTER — Encounter: Payer: Self-pay | Admitting: Plastic Surgery

## 2020-08-21 ENCOUNTER — Other Ambulatory Visit: Payer: Self-pay | Admitting: Oncology

## 2020-08-21 VITALS — BP 147/70 | HR 80 | Temp 98.5°F

## 2020-08-21 DIAGNOSIS — Z9012 Acquired absence of left breast and nipple: Secondary | ICD-10-CM

## 2020-08-21 DIAGNOSIS — Z9889 Other specified postprocedural states: Secondary | ICD-10-CM

## 2020-08-21 DIAGNOSIS — C7951 Secondary malignant neoplasm of bone: Secondary | ICD-10-CM

## 2020-08-21 SURGERY — REMOVAL, TISSUE EXPANDER, BREAST, WITH IMPLANT INSERTION
Anesthesia: General | Site: Breast | Laterality: Left

## 2020-08-21 NOTE — Progress Notes (Signed)
DEB's FISH was negative for HER2 with a ratio of  1.56 nd acopy number of 2.10

## 2020-08-28 DIAGNOSIS — Z01419 Encounter for gynecological examination (general) (routine) without abnormal findings: Secondary | ICD-10-CM | POA: Diagnosis not present

## 2020-08-28 DIAGNOSIS — Z6830 Body mass index (BMI) 30.0-30.9, adult: Secondary | ICD-10-CM | POA: Diagnosis not present

## 2020-08-28 DIAGNOSIS — Z124 Encounter for screening for malignant neoplasm of cervix: Secondary | ICD-10-CM | POA: Diagnosis not present

## 2020-08-28 MED FILL — IBRANCE 125 MG TABS: 125 | 28 days supply | Qty: 21 | Fill #0

## 2020-08-28 NOTE — Telephone Encounter (Signed)
Oral Chemotherapy Pharmacist Encounter  I spoke with patient for overview of: Ibrance for the treatment of metastatic, hormone-receptor positive, HER2 receptor negative breast cancer, in combination with fulvestrant, planned duration until disease progression or unacceptable toxicity.   Counseled patient on administration, dosing, side effects, monitoring, drug-food interactions, safe handling, storage, and disposal.  Patient will take Ibrance 156m tablets, 1 tablet by mouth once daily, with or without food, taken for 3 weeks on, 1 week off, and repeated.  Patient knows to avoid grapefruit and grapefruit juice while on treatment with Ibrance.  Ibrance start date: ~09/12/20 (patient knows to wait until she sees Dr. MJana Hakimon 09/10/20 for further instruction on specific start date)   Adverse effects include but are not limited to: fatigue, hair loss, GI upset, nausea, decreased blood counts, and increased upper respiratory infections.  Severe, life-threatening, and/or fatal interstitial lung disease (ILD) and/or pneumonitis may occur with CDK 4/6 inhibitors.  Patient will obtain anti diarrheal and alert the office of 4 or more loose stools above baseline.  Patient reminded of WBC check on Cycle 1 Day 14 for dose and ANC assessment.  Reviewed with patient importance of keeping a medication schedule and plan for any missed doses. No barriers to medication adherence identified.  Medication reconciliation performed and medication/allergy list updated.  [facility] for [facility]. Test claim at the pharmacy revealed copayment $0 for 1st fill of Ibrance. [facility]  Patient informed the pharmacy will reach out 5-7 days prior to needing next fill of Ibrance to coordinate continued medication acquisition to prevent break in therapy.  All questions answered.  Ms. DLislevoiced understanding  and appreciation.   Medication education handout placed in mail for patient. Patient knows to call the office with questions or concerns. Oral Chemotherapy Clinic phone number provided to patient.   RLeron Croak PharmD, BCPS Hematology/Oncology Clinical Pharmacist [id number]/29/2021 11:33 AM

## 2020-08-29 ENCOUNTER — Inpatient Hospital Stay: Payer: BC Managed Care – PPO

## 2020-08-29 ENCOUNTER — Other Ambulatory Visit: Payer: Self-pay

## 2020-08-29 VITALS — BP 142/62 | HR 72 | Temp 98.2°F | Resp 18 | Ht 64.0 in

## 2020-08-29 DIAGNOSIS — C7951 Secondary malignant neoplasm of bone: Secondary | ICD-10-CM

## 2020-08-29 DIAGNOSIS — Z79810 Long term (current) use of selective estrogen receptor modulators (SERMs): Secondary | ICD-10-CM | POA: Diagnosis not present

## 2020-08-29 DIAGNOSIS — Z9013 Acquired absence of bilateral breasts and nipples: Secondary | ICD-10-CM | POA: Diagnosis not present

## 2020-08-29 DIAGNOSIS — C50512 Malignant neoplasm of lower-outer quadrant of left female breast: Secondary | ICD-10-CM | POA: Diagnosis not present

## 2020-08-29 DIAGNOSIS — Z923 Personal history of irradiation: Secondary | ICD-10-CM | POA: Diagnosis not present

## 2020-08-29 DIAGNOSIS — Z170 Estrogen receptor positive status [ER+]: Secondary | ICD-10-CM | POA: Diagnosis not present

## 2020-08-29 DIAGNOSIS — Z79899 Other long term (current) drug therapy: Secondary | ICD-10-CM | POA: Diagnosis not present

## 2020-08-29 MED ORDER — FULVESTRANT 250 MG/5ML IM SOLN
INTRAMUSCULAR | Status: AC
  Filled 2020-08-29: qty 10

## 2020-08-29 MED ORDER — FULVESTRANT 250 MG/5ML IM SOLN
500.0000 mg | Freq: Once | INTRAMUSCULAR | Status: AC
Start: 2020-08-29 — End: 2020-08-29
  Administered 2020-08-29: 500 mg via INTRAMUSCULAR

## 2020-08-29 NOTE — Patient Instructions (Signed)
Fulvestrant injection What is this medicine? FULVESTRANT (ful VES trant) blocks the effects of estrogen. It is used to treat breast cancer. This medicine may be used for other purposes; ask your health care provider or pharmacist if you have questions. COMMON BRAND NAME(S): FASLODEX What should I tell my health care provider before I take this medicine? They need to know if you have any of these conditions:  bleeding disorders  liver disease  low blood counts, like low white cell, platelet, or red cell counts  an unusual or allergic reaction to fulvestrant, other medicines, foods, dyes, or preservatives  pregnant or trying to get pregnant  breast-feeding How should I use this medicine? This medicine is for injection into a muscle. It is usually given by a health care professional in a hospital or clinic setting. Talk to your pediatrician regarding the use of this medicine in children. Special care may be needed. Overdosage: If you think you have taken too much of this medicine contact a poison control center or emergency room at once. NOTE: This medicine is only for you. Do not share this medicine with others. What if I miss a dose? It is important not to miss your dose. Call your doctor or health care professional if you are unable to keep an appointment. What may interact with this medicine?  medicines that treat or prevent blood clots like warfarin, enoxaparin, dalteparin, apixaban, dabigatran, and rivaroxaban This list may not describe all possible interactions. Give your health care provider a list of all the medicines, herbs, non-prescription drugs, or dietary supplements you use. Also tell them if you smoke, drink alcohol, or use illegal drugs. Some items may interact with your medicine. What should I watch for while using this medicine? Your condition will be monitored carefully while you are receiving this medicine. You will need important blood work done while you are taking  this medicine. Do not become pregnant while taking this medicine or for at least 1 year after stopping it. Women of child-bearing potential will need to have a negative pregnancy test before starting this medicine. Women should inform their doctor if they wish to become pregnant or think they might be pregnant. There is a potential for serious side effects to an unborn child. Men should inform their doctors if they wish to father a child. This medicine may lower sperm counts. Talk to your health care professional or pharmacist for more information. Do not breast-feed an infant while taking this medicine or for 1 year after the last dose. What side effects may I notice from receiving this medicine? Side effects that you should report to your doctor or health care professional as soon as possible:  allergic reactions like skin rash, itching or hives, swelling of the face, lips, or tongue  feeling faint or lightheaded, falls  pain, tingling, numbness, or weakness in the legs  signs and symptoms of infection like fever or chills; cough; flu-like symptoms; sore throat  vaginal bleeding Side effects that usually do not require medical attention (report to your doctor or health care professional if they continue or are bothersome):  aches, pains  constipation  diarrhea  headache  hot flashes  nausea, vomiting  pain at site where injected  stomach pain This list may not describe all possible side effects. Call your doctor for medical advice about side effects. You may report side effects to FDA at 1-800-FDA-1088. Where should I keep my medicine? This drug is given in a hospital or clinic and will   not be stored at home. NOTE: This sheet is a summary. It may not cover all possible information. If you have questions about this medicine, talk to your doctor, pharmacist, or health care provider.  2020 Elsevier/Gold Standard (2017-11-25 11:34:41)  

## 2020-08-30 ENCOUNTER — Encounter: Payer: BC Managed Care – PPO | Admitting: Surgical

## 2020-09-03 ENCOUNTER — Other Ambulatory Visit: Payer: Self-pay | Admitting: Oncology

## 2020-09-03 LAB — SURGICAL PATHOLOGY

## 2020-09-06 ENCOUNTER — Encounter: Payer: BC Managed Care – PPO | Admitting: Plastic Surgery

## 2020-09-09 NOTE — Progress Notes (Signed)
[place]  [phone] [phone]     ID: Natasha Graham DOB: Mar 31, 1961  MR#: [id number]  IPJ#:[id number]  Patient Care Team: Leonides Sake, MD as PCP - General (Family Medicine) Rolm Bookbinder, MD as Consulting Physician (General Surgery) Rock Sobol, Virgie Dad, MD as Consulting Physician (Oncology) Mauro Kaufmann, RN as Registered Nurse Rockwell Germany, RN as Registered Nurse Dillingham, Loel Lofty, DO as Attending Physician (Plastic Surgery) Gery Pray, MD as Consulting Physician (Radiation Oncology) OTHER MD: Sherrie Mustache MD   CHIEF COMPLAINT: new estrogen receptor positive left breast cancer; remote noninvasive right breast cancer (s/p bilateral mastectomies)  CURRENT TREATMENT: Denosumab/Xgeva; fulvestrant; palbociclib/Ibrance   INTERVAL HISTORY: Natasha Graham returns today for follow up of her newly diagnosed invasive left breast cancer.  FISH results returned from her bone marrow biopsy showing: Her2 negative with a signals ratio 1.56 and number per cell 2.1.  She started fulvestrant 4 weeks ago and received a second dose 2 weeks ago.  She has had no problems from this including no significant discomfort from the actual injections  She also received her first denosumab/Xgeva dose 4 weeks ago.  She did not experience any bony aches or any symptoms suggestive of hypocalcemia  She has her Ibrance on hand and is planning to start it as directed 09/12/2020   REVIEW OF SYSTEMS: Natasha Graham tells me that they had a quiet but "good" holiday.  She is following up with Dr. Tedra Coupe him later this week and considering March as the months to have her definitive saline implants placed.  Otherwise she maintains a normal functional status, has no pain, unexplained fatigue or unexplained weight loss, and no other symptoms related to her cancer or its treatment.   COVID 19 VACCINATION STATUS: Patient had antibodies to the virus documented November 2020.   She is not planning on vaccinations   LEFT BREAST CANCER HISTORY: From the original intake note:  She had routine screening left mammogram on 06/14/2019 showing a developing asymmetry. She presented for left diagnostic mammogram and left breast ultrasound on 07/07/2019. Physical exam that day showed a palpable, firm, superficial mass in the left breast at 6 o'clock along the site of reduction mammoplasty scar (performed in 2015 with right mastectomy). Scans showed: breast density category B; 2.1 cm superficial mass involving the skin in the left breast at 6 o'clock; no enlarged adenopathy in the left axilla.   Accordingly on 07/12/2019 she proceeded to biopsy of the left breast area in question. The pathology from this procedure ([id number]) showed: invasive lobular carcinoma, grade 1, with perineural invasion present. Prognostic indicators significant for: estrogen receptor, 60% positive with moderate staining intensity and progesterone receptor, 0% negative. Proliferation marker Ki67 at <1%. HER2 equvocal by immunohistochemistry (2+), but negative by fluorescent in situ hybridization with a signals ratio 1.18 and number per cell 1.95.  She opted to proceed with left mastectomy on 09/27/2019 under Dr. Donne Hazel with immediate reconstruction under Dr. Marla Roe. Pathology from the procedure [id number]) revealed: invasive lobular carcinoma, grade 2, 6.2 cm, focally involving anterior margin and involving skin dermis without involvement of epidermis; lobular carcinoma in situ with pagetoid spread; small focus of low grade ductal carcinoma in situ, 0.2 cm.  Two lymph nodes were biopsied. One showed metastatic lobular carcinoma with focus measuring 2.1 cm with evidence of extranodal extension. The other lymph node also showed metastatic lobular carcinoma with no lymphoid tissue present.  Biopsy of the nipple was also taken at that time and showed no evidence  of invasive carcinoma.  Mammaprint was  performed on the final surgical sample. This returned showing low risk.  RIGHT BREAST CANCER HISTORY: Natasha Graham has a history of right breast biopsy in late 2007 showing atypical ductal hyperplasia in the upper outer right breast.. Breast MRI 07/13/2006 showed postbiopsy changes in the upper right breast, and some right breast cysts. There were no solid masses however or areas of abnormal enhancement. There were no enlarged lymph nodes. Incidental finding of liver cysts was made. On 07/30/2006 she underwent needle localization of the calcifications in the outer right breast which also showed ([id number]) atypical ductal hyperplasia, with no malignancy.   On 10/21/2012 routine digital bilateral screening mammography at the breast Center showed breast density to be category C. In the right breast there were calcifications and architectural distortion to go with skin retraction. Additional views obtained 11/17/2012 showed extensive but stable punctate microcalcifications in the upper outer right breast. Many of these were superficial and not amenable to stereotactic biopsy. MRI was suggested but the patient was unable to undergo that test and therefore six-month mammography was obtained 05/15/2013. This showed the calcifications in the outer right breast to have been stable. Because of their extents and because of their superficial location they could not be all sampled all removed with biopsy. MRI was again discussed, but the patient has Harrington rods in place and also felt the cost of MRI was prohibitive.  On 11/13/2013 further six-month follow-up showed a grouping of intermediate microcalcifications in the lateral right breast and these were biopsied 11/20/2013. The result of that procedure ([id number]) showed atypical ductal hyperplasia. Accordingly on 02/23/2014 the patient underwent right lumpectomy, with the pathology ([id number]) showing ductal carcinoma in situ, grade 2, measuring 0.7 cm. Estrogen  receptor was 100% positive. Progesterone receptor was 96% positive. Both showed strong staining intensity. Margins were clear but the closest margin anteriorly was at 1 mm.  On 03/07/2014 the patient underwent postoperative mammography which showed post lumpectomy changes but also calcifications extending from the lumpectomy site anteriorly towards the nipple. On 03/12/2014 the patient underwent bilateral breast MRI. Breast composition here was described as category B. In the right breast there was a 4.8 cm fluidlike excisional cavity in the lower outer right breast. Adjacent to this was a focus of non-masslike enhancement measuring 1.9 cm. This is an area associated with the scar from the excision in 2007. There was also non-masslike enhancement inferior to the biopsy cavity measuring 3.8 cm. There were no abnormal lymph nodes and no masses or abnormal enhancement in the left breast. Specifically the left breast lower outer quadrant was unchanged from 2007 MRI.  Overall it was felt the linear non-masslike enhancement extending almost 4 cm inferior to the excisional cavity was sufficiently suspicious to warrant biopsy, and this was performed 03/29/2014. It again showed ductal carcinoma in situ, grade 1, estrogen receptor and progesterone receptor both 100% positive with strong staining intensity.  At this point the patient met with surgery and opted for definitive right mastectomy with immediate reconstruction. On 05/16/2014 she underwent simple mastectomy with sentinel lymph node sampling. The pathology ([id number]) showed atypical ductal hyperplasia but no evidence of neoplasia. All 5 sentinel lymph nodes were clear. The patient had immediate right breast reconstruction with tissue expander and dermal matrix placement.  She is scheduled for definitive implant placement 08/08/2014.  Her subsequent history is as detailed below   PAST MEDICAL HISTORY: Past Medical History:  Diagnosis Date  . Cancer  ([place])  breast cancer  . Family history of brain cancer   . Family history of breast cancer   . Family history of colon cancer   . Family history of stomach cancer   . Hypertension   . PONV (postoperative nausea and vomiting)   . Scoliosis   . Wears glasses     PAST SURGICAL HISTORY: Past Surgical History:  Procedure Laterality Date  . AUGMENTATION MAMMAPLASTY Right    2015 post mastectomy  . AXILLARY LYMPH NODE DISSECTION Left 11/13/2019   Procedure: LEFT AXILLARY LYMPH NODE DISSECTION;  Surgeon: Rolm Bookbinder, MD;  Location[facility];  Service: General;  Laterality: Left;  . BREAST RECONSTRUCTION WITH PLACEMENT OF TISSUE EXPANDER AND FLEX HD (ACELLULAR HYDRATED DERMIS) Right 05/16/2014   Procedure: IMMEDIATE RIGHT BREAST RECONSTRUCTION WITH PLACEMENT OF TISSUE EXPANDER AND FLEX HD (ACELLULAR HYDRATED DERMIS);  Surgeon: Theodoro Kos, DO;  Location: [facility];  Service: Plastics;  Laterality: Right;  . BREAST RECONSTRUCTION WITH PLACEMENT OF TISSUE EXPANDER AND FLEX HD (ACELLULAR HYDRATED DERMIS) Left 09/27/2019   Procedure: LEFT BREAST RECONSTRUCTION WITH PLACEMENT OF TISSUE EXPANDER AND FLEX HD (ACELLULAR HYDRATED DERMIS);  Surgeon: Wallace Going, DO;  Location: [facility];  Service: Plastics;  Laterality: Left;  . BREAST SURGERY     right breast excisional biopsy  . DILATION AND CURETTAGE OF UTERUS    . HYSTEROSCOPY WITH D & C N/A 09/20/2015   Procedure: DILATATION AND CURETTAGE /HYSTEROSCOPY;  Surgeon: Brien Few, MD;  Location: [facility];  Service: Gynecology;  Laterality: N/A;  . LIPOSUCTION Bilateral 08/08/2014   Procedure: LIPO SUCTION ;  Surgeon: Theodoro Kos, DO;  Location: [facility];  Service: Plastics;  Laterality: Bilateral;  . MASTECTOMY Right 05/16/2014   placement of acellular dermal matrix & tissue expanders   . MASTOPEXY Left 08/08/2014   Procedure:  MASTOPEXY FOR SYMMETRY;  Surgeon: Theodoro Kos, DO;  Location:  [facility];  Service: Plastics;  Laterality: Left;  . NIPPLE SPARING MASTECTOMY WITH SENTINEL LYMPH NODE BIOPSY Left 09/27/2019   Procedure: LEFT NIPPLE SPARING MASTECTOMY WITH LEFT AXILLARY SENTINEL LYMPH NODE BIOPSY;  Surgeon: Rolm Bookbinder, MD;  Location: [facility];  Service: General;  Laterality: Left;  . REDUCTION MAMMAPLASTY Left    2015  . REMOVAL OF TISSUE EXPANDER AND PLACEMENT OF IMPLANT Right 08/08/2014   Procedure: REMOVAL OF RIGHT  TISSUE EXPANDERS WITH PLACEMENT OF RIGHT BREAST IMPLANTS WITH LIPO SUCTION ;  Surgeon: Theodoro Kos, DO;  Location: [facility];  Service: Plastics;  Laterality: Right;  . SCAR REVISION Left 11/13/2019   Procedure: EXCISION OF LEFT MASTECTOMY SKIN;  Surgeon: Rolm Bookbinder, MD;  Location: [facility];  Service: General;  Laterality: Left;  . scoliosis  1972   harrington rods-age 89  . TONSILLECTOMY      FAMILY HISTORY Family History  Problem Relation Age of Onset  . Heart disease Mother   . Cancer Father        liver  . Colon cancer Father 37  . Head & neck cancer Father        oral cancer - dx in 15s-50s  . Breast cancer Paternal Aunt        dx <50  . Brain cancer Brother        possible meningioma  . Cancer Maternal Aunt        liver/lung cancer  . Stomach cancer Paternal Uncle   . Heart disease Maternal Grandmother   .  Lymphoma Maternal Grandfather        NHL  . Heart attack Paternal Grandfather   . Cancer Paternal Uncle        NOS   there is significant cancer history on the paternal side, her father dying at the age of 79 with what the patient tells me was metastatic head and neck cancer (but also involving the colon and liver). One of the patient's father's brothers had stomach cancer, and another had a brain tumor, and 2 of the patient's father's sisters had breast cancer, 1 diagnosed at age 32, the other at age 37.  On the mother's side there is a history of  non-Hodgkin's lymphoma at age 92. There is no history of ovarian cancer in the family   GYNECOLOGIC HISTORY:  Patient's last menstrual period was 10/16/2013.  menarche age 16, first live birth age 79. The patient is GX P3. She stopped having periods in October 2014. She did not take hormone replacement. She did take birth control pills for approximately 1 year remotely, with no complications.   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 She works as an Web designer. Son Truman Hayward drives a truck for the family business. Daughter Thayer Headings is a Forensic psychologist and currently works as an IT trainer in [place].  Daughter Roselyn Reef is studying history and English at [employer]. The patient has 2 granddaughters aged 88 and 1 as of May 2021. She attends [employer]    ADVANCED DIRECTIVES: In the absence of any documents to the contrary the patient's husband is her healthcare power of attorney   HEALTH MAINTENANCE: Social History   Tobacco Use  . Smoking status: Never Smoker  . Smokeless tobacco: Never Used  Vaping Use  . Vaping Use: Never used  Substance Use Topics  . Alcohol use: No  . Drug use: No     Colonoscopy:  2013  PAP: March 2015  Bone density:  Lipid panel:  No Known Allergies  Current Outpatient Medications  Medication Sig Dispense Refill  . acetaminophen (TYLENOL) 500 MG tablet Take by mouth.    . Ascorbic Acid (VITAMIN C) 100 MG tablet Take 100 mg by mouth daily.    . Flaxseed Oil (LINSEED OIL) OIL by Misc.(Non-Drug; Combo Route) route.    . Ginger, Zingiber officinalis, (GINGER PO) Take by mouth.    Marland Kitchen ibuprofen (ADVIL) 200 MG tablet Take by mouth.    Marland Kitchen lisinopril-hydrochlorothiazide (PRINZIDE,ZESTORETIC) 20-25 MG per tablet Take 1 tablet by mouth every evening.     . Magnesium 250 MG TABS Take by mouth.    . Multiple Vitamin (MULTIVITAMIN) capsule Take by mouth.    .  palbociclib (IBRANCE) 125 MG tablet Take 1 tablet (125 mg total) by mouth daily. Take for 21 days on, 7 days off, repeat every 28 days. Start 09/12/2020 21 tablet 6  . Turmeric 500 MG CAPS Take by mouth.     No current facility-administered medications for this visit.    OBJECTIVE: White woman in no acute distress Vitals:   09/10/20 1455  BP: (!) 161/73  Pulse: (!) 103  Resp: 17  Temp: (!) 97.5 F (36.4 C)  SpO2: 100%     Body mass index is 28.41 kg/m.    ECOG FS:1 - Symptomatic but completely ambulatory  Sclerae unicteric, EOMs intact Wearing a mask No cervical or supraclavicular adenopathy Lungs no rales or rhonchi Heart regular rate and  rhythm Abd soft, nontender, positive bowel sounds MSK no focal spinal tenderness, no upper extremity lymphedema Neuro: nonfocal, well oriented, appropriate somewhat flat affect Breasts: The right breast is status post mastectomy with saline implant reconstruction.  There is no evidence of local recurrence.  The left breast is status post mastectomy with implant in place.  There is no evidence of local recurrence or residual disease.  Both axillae are benign   LAB RESULTS:  CMP     Component Value Date/Time   NA 141 09/10/2020 1439   NA 143 07/30/2014 1603   K 3.3 (L) 09/10/2020 1439   K 3.5 07/30/2014 1603   CL 103 09/10/2020 1439   CO2 29 09/10/2020 1439   CO2 30 (H) 07/30/2014 1603   GLUCOSE 127 (H) 09/10/2020 1439   GLUCOSE 93 07/30/2014 1603   BUN 8 09/10/2020 1439   BUN 10.5 07/30/2014 1603   CREATININE 0.74 09/10/2020 1439   CREATININE 0.9 07/30/2014 1603   CALCIUM 9.2 09/10/2020 1439   CALCIUM 9.5 07/30/2014 1603   PROT 6.9 09/10/2020 1439   PROT 7.0 07/30/2014 1603   ALBUMIN 3.8 09/10/2020 1439   ALBUMIN 4.0 07/30/2014 1603   AST 17 09/10/2020 1439   AST 23 07/30/2014 1603   ALT 23 09/10/2020 1439   ALT 23 07/30/2014 1603   ALKPHOS 74 09/10/2020 1439   ALKPHOS 78 07/30/2014 1603   BILITOT 0.4 09/10/2020 1439    BILITOT 0.29 07/30/2014 1603   GFRNONAA >60 09/10/2020 1439   GFRAA >60 04/29/2020 0829    INo results found for: SPEP, UPEP  Lab Results  Component Value Date   WBC 5.0 09/10/2020   NEUTROABS 3.2 09/10/2020   HGB 13.4 09/10/2020   HCT 38.5 09/10/2020   MCV 92.5 09/10/2020   PLT 295 09/10/2020      Chemistry      Component Value Date/Time   NA 141 09/10/2020 1439   NA 143 07/30/2014 1603   K 3.3 (L) 09/10/2020 1439   K 3.5 07/30/2014 1603   CL 103 09/10/2020 1439   CO2 29 09/10/2020 1439   CO2 30 (H) 07/30/2014 1603   BUN 8 09/10/2020 1439   BUN 10.5 07/30/2014 1603   CREATININE 0.74 09/10/2020 1439   CREATININE 0.9 07/30/2014 1603      Component Value Date/Time   CALCIUM 9.2 09/10/2020 1439   CALCIUM 9.5 07/30/2014 1603   ALKPHOS 74 09/10/2020 1439   ALKPHOS 78 07/30/2014 1603   AST 17 09/10/2020 1439   AST 23 07/30/2014 1603   ALT 23 09/10/2020 1439   ALT 23 07/30/2014 1603   BILITOT 0.4 09/10/2020 1439   BILITOT 0.29 07/30/2014 1603      No results found for: LABCA2  No components found for: LABCA125  No results for input(s): INR in the last 168 hours.  Urinalysis No results found for: COLORURINE, APPEARANCEUR, LABSPEC, PHURINE, GLUCOSEU, HGBUR, BILIRUBINUR, KETONESUR, PROTEINUR, UROBILINOGEN, NITRITE, LEUKOCYTESUR   STUDIES: No results found.     ASSESSMENT: 60 y.o. [place], [place] woman  RIGHT BREAST CANCER (1) status post right breast upper-outer quadrant lumpectomy 07/30/2006 showing atypical ductal hyperplasia.  (2) right breast upper outer quadrant biopsy 11/20/2013 showed atypical ductal hyperplasia   (3) right lumpectomy 02/26/2014 showed ductal carcinoma in situ measuring 0.7 cm, estrogen receptor 100% positive, progesterone receptor 96% positive, with 1 mm margins  (4) right breast lower inner quadrant biopsy 03/29/2014 showed ductal carcinoma in situ, 100% estrogen receptor positive, 100% progesterone receptor positive,  (  5) status  post right mastectomy with sentinel lymph node sampling showing only atypical ductal hyperplasia    (a) five sentinel lymph nodes removed, all clear  (b) status post right saline implant reconstruction  (6) the patient opted against prophylactic antiestrogens   LEFT BREAST CANCER (7) status post left breast lower outer quadrant biopsy 07/12/2019 for a clinical T1N0, stage IA invasive lobular carcinoma, grade 1, with evidence of perineural invasion, estrogen receptor moderately positive, progesterone receptor negative, HER-2 not amplified, with an MIB-1 of less than 1%  (8) status post left nipple sparing mastectomy 09/27/2019 for a pT3 pN1, stage IIIA invasive lobular carcinoma, grade 2, with a negative nipple biopsy and a focally positive anterior margin  (a) 1 sentinel lymph node had a 2.1 cm tumor deposit; a second positive "lymph node" had no lymph node tissue  (b) immediate expander placement  (c) status post left axillary lymph node dissection and margin excision 11/13/2019, no residual disease   (i) 7 of 7 left axillary lymph nodes negative for carcinoma (total 8 nodes removed)  (d) repeat prognostic panel again estrogen receptor 80% positive with moderate staining, progesterone receptor negative    (9) MammaPrint "low risk" predicts no significant benefit from chemotherapy.   (10) adjuvant radiation 12/26/2019 - 02/06/2020 Site Technique Total Dose (Gy) Dose per Fx (Gy) Completed Fx Beam Energies  Chest Wall, Left: CW_Lt_Bst Electron 10/10 2 5/5 6E  Sclav-LT: SCV_Lt Complex 50/50 2 25/25 6X, 15X  Sclav-LT: SCV_Lt_Bst Complex 8/8 2 4/4 6X, 15X  Chest Wall, Left: CW_Lt 3D 50/50 2 25/25 6X, 10X, 15X   (11) staging studies:   (a) chest, abdomen and pelvic CT with contrast 10/31/2019 shows mild hepatic steatosis, small uterine fibroids, 0.3 cm left upper lobe nodule, but no evidence of metastatic disease.  (b) F18 estradiol PET scan 11/21/2019 shows a chain of very small lymph nodes from  the subpectoralis nodal station to the left supraclavicular nodal station positive for ER accumulation with SUVs in the 4 range (versus mediastinal blood pool 1.4). no evidence of lung, bone, or liver involvement.   (12) tamoxifen started 02/29/2020, discontinued DEC 2021 with recurrence  (13) genetics testing 06/27/2020 through the CancerNext-Expanded gene panel offered by [facility] found no deleterious mutations in AIP, ALK, APC*, ATM*, AXIN2, BAP1, BARD1, BLM, BMPR1A, BRCA1*, BRCA2*, BRIP1*, CDC73, CDH1*, CDK4, CDKN1B, CDKN2A, CHEK2*, CTNNA1, DICER1, FANCC, FH, FLCN, GALNT12, KIF1B, LZTR1, MAX, MEN1, MET, MLH1*, MSH2*, MSH3, MSH6*, MUTYH*, NBN, NF1*, NF2, NTHL1, PALB2*, PHOX2B, PMS2*, POT1, PRKAR1A, PTCH1, PTEN*, RAD51C*, RAD51D*, RB1, RECQL, RET, SDHA, SDHAF2, SDHB, SDHC, SDHD, SMAD4, SMARCA4, SMARCB1, SMARCE1, STK11, SUFU, TMEM127, TP53*, TSC1, TSC2, VHL and XRCC2 (sequencing and deletion/duplication); EGFR, EGLN1, HOXB13, KIT, MITF, PDGFRA, POLD1, and POLE (sequencing only); EPCAM and GREM1 (deletion/duplication only). DNA and RNA analyses performed for * genes. The report date is 06/27/2020.  (14) reconstruction in process  METASTATIC DISEASE: December 2021 (15) chest and neck CT with contrast 08/02/2020 shows diffuse bony sclerosis consistent with metastatic disease, no evidence of visceral disease  (a) CA 27-29 on 08/05/2020 showed 98.5  (b) bone marrow biopsy 08/06/2020 showed metastatic carcinoma, estrogen receptor positive (75% moderate/strong), progesterone receptor and HER-2 negative.  (16) denosumab/Xgeva started 08/15/2020  (17) fulvestrant started 08/15/2020  (a) palbociclib to be started 09/12/2020 at 125 mg per day, 21/7   PLAN: Natasha Graham is tolerating the fulvestrant and denosumab without any unusual side effects and the plan is to continue them monthly now so long as there is no evidence of  disease progression.  She has the palbociclib on hand.  She did not have to pay  for this.  Just to make sure that she will have continued support, since we have just entered a new year, I am really prescribing it through our pharmacy for review  She has a good understanding of the possible toxicities side effects and complications of palbociclib.    She understands that bone only metastatic breast cancer is very difficult to follow.  There is no actual measurable disease.  The bone lesions may look worse when they are healing and may look better when they are actually getting worse (more lytic).  Accordingly we are going to obtain a bone scan before her next visit here, which will serve as a baseline.  We are also going to be following her CA 27-29 on a monthly basis.  She understands this is imperfect but can give [place] hints as to whether more intensive evaluation is needed at any particular point.  She is going to see me again in 4 weeks, with her next fulvestrant dose.  She knows to call for any other issue that may develop before the next visit  Total encounter time 45 minutes.Sarajane Jews C. Yossef Gilkison, MD 09/10/2020 5:51 PM  Oncology and Hematology [facility] [place], [place] [address] Tel. [phone]  Joylene Igo [phone]   I, Wilburn Mylar, am acting as scribe for Dr. Sarajane Jews C. Lylith Bebeau.  I, Lurline Del MD, have reviewed the above documentation for accuracy and completeness, and I agree with the above.   *Total Encounter Time as defined by the Centers for Medicare and Medicaid Services includes, in addition to the face-to-face time of a patient visit (documented in the note above) non-face-to-face time: obtaining and reviewing outside history, ordering and reviewing medications, tests or procedures, care coordination (communications with other health care professionals or caregivers) and documentation in the medical record.

## 2020-09-10 ENCOUNTER — Other Ambulatory Visit: Payer: Self-pay

## 2020-09-10 ENCOUNTER — Inpatient Hospital Stay (HOSPITAL_BASED_OUTPATIENT_CLINIC_OR_DEPARTMENT_OTHER): Payer: BC Managed Care – PPO | Admitting: Oncology

## 2020-09-10 ENCOUNTER — Inpatient Hospital Stay: Payer: BC Managed Care – PPO | Attending: Oncology

## 2020-09-10 ENCOUNTER — Inpatient Hospital Stay: Payer: BC Managed Care – PPO

## 2020-09-10 VITALS — BP 161/73 | HR 103 | Temp 97.5°F | Resp 17 | Ht 64.0 in | Wt 165.5 lb

## 2020-09-10 DIAGNOSIS — Z9012 Acquired absence of left breast and nipple: Secondary | ICD-10-CM | POA: Diagnosis not present

## 2020-09-10 DIAGNOSIS — K760 Fatty (change of) liver, not elsewhere classified: Secondary | ICD-10-CM

## 2020-09-10 DIAGNOSIS — D259 Leiomyoma of uterus, unspecified: Secondary | ICD-10-CM | POA: Diagnosis not present

## 2020-09-10 DIAGNOSIS — Z170 Estrogen receptor positive status [ER+]: Secondary | ICD-10-CM | POA: Insufficient documentation

## 2020-09-10 DIAGNOSIS — C50512 Malignant neoplasm of lower-outer quadrant of left female breast: Secondary | ICD-10-CM | POA: Diagnosis not present

## 2020-09-10 DIAGNOSIS — Z923 Personal history of irradiation: Secondary | ICD-10-CM | POA: Diagnosis not present

## 2020-09-10 DIAGNOSIS — C50912 Malignant neoplasm of unspecified site of left female breast: Secondary | ICD-10-CM

## 2020-09-10 DIAGNOSIS — Z79899 Other long term (current) drug therapy: Secondary | ICD-10-CM | POA: Insufficient documentation

## 2020-09-10 DIAGNOSIS — Z853 Personal history of malignant neoplasm of breast: Secondary | ICD-10-CM | POA: Diagnosis not present

## 2020-09-10 DIAGNOSIS — R911 Solitary pulmonary nodule: Secondary | ICD-10-CM | POA: Diagnosis not present

## 2020-09-10 DIAGNOSIS — K7689 Other specified diseases of liver: Secondary | ICD-10-CM

## 2020-09-10 DIAGNOSIS — Z9013 Acquired absence of bilateral breasts and nipples: Secondary | ICD-10-CM | POA: Diagnosis not present

## 2020-09-10 DIAGNOSIS — C7951 Secondary malignant neoplasm of bone: Secondary | ICD-10-CM | POA: Diagnosis not present

## 2020-09-10 DIAGNOSIS — I700 Atherosclerosis of aorta: Secondary | ICD-10-CM

## 2020-09-10 LAB — COMPREHENSIVE METABOLIC PANEL
ALT: 23 U/L (ref 0–44)
AST: 17 U/L (ref 15–41)
Albumin: 3.8 g/dL (ref 3.5–5.0)
Alkaline Phosphatase: 74 U/L (ref 38–126)
Anion gap: 9 (ref 5–15)
BUN: 8 mg/dL (ref 6–20)
CO2: 29 mmol/L (ref 22–32)
Calcium: 9.2 mg/dL (ref 8.9–10.3)
Chloride: 103 mmol/L (ref 98–111)
Creatinine, Ser: 0.74 mg/dL (ref 0.44–1.00)
GFR, Estimated: >60 mL/min (ref >60)
Glucose, Bld: 127 mg/dL — ABNORMAL HIGH (ref 70–99)
Potassium: 3.3 mmol/L — ABNORMAL LOW (ref 3.5–5.1)
Sodium: 141 mmol/L (ref 135–145)
Total Bilirubin: 0.4 mg/dL (ref 0.3–1.2)
Total Protein: 6.9 g/dL (ref 6.5–8.1)

## 2020-09-10 LAB — CBC WITH DIFFERENTIAL/PLATELET
Abs Immature Granulocytes: 0.02 10*3/uL (ref 0.00–0.07)
Basophils Absolute: 0 10*3/uL (ref 0.0–0.1)
Basophils Relative: 1 %
Eosinophils Absolute: 0.1 10*3/uL (ref 0.0–0.5)
Eosinophils Relative: 1 %
HCT: 38.5 % (ref 36.0–46.0)
Hemoglobin: 13.4 g/dL (ref 12.0–15.0)
Immature Granulocytes: 0 %
Lymphocytes Relative: 26 %
Lymphs Abs: 1.3 10*3/uL (ref 0.7–4.0)
MCH: 32.2 pg (ref 26.0–34.0)
MCHC: 34.8 g/dL (ref 30.0–36.0)
MCV: 92.5 fL (ref 80.0–100.0)
Monocytes Absolute: 0.4 10*3/uL (ref 0.1–1.0)
Monocytes Relative: 8 %
Neutro Abs: 3.2 10*3/uL (ref 1.7–7.7)
Neutrophils Relative %: 64 %
Platelets: 295 10*3/uL (ref 150–400)
RBC: 4.16 MIL/uL (ref 3.87–5.11)
RDW: 12.2 % (ref 11.5–15.5)
WBC: 5 10*3/uL (ref 4.0–10.5)
nRBC: 0 % (ref 0.0–0.2)

## 2020-09-10 MED ORDER — FULVESTRANT 250 MG/5ML IM SOLN
INTRAMUSCULAR | Status: AC
  Filled 2020-09-10: qty 5

## 2020-09-10 MED ORDER — FULVESTRANT 250 MG/5ML IM SOLN
500.0000 mg | Freq: Once | INTRAMUSCULAR | Status: AC
  Administered 2020-09-10: 500 mg via INTRAMUSCULAR

## 2020-09-10 MED ORDER — DENOSUMAB 120 MG/1.7ML ~~LOC~~ SOLN
120.0000 mg | Freq: Once | SUBCUTANEOUS | Status: AC
  Administered 2020-09-10: 120 mg via SUBCUTANEOUS

## 2020-09-10 MED ORDER — PALBOCICLIB 125 MG PO TABS: 125.0000 mg | ORAL_TABLET | Freq: Every day | ORAL | 6 refills | Status: DC

## 2020-09-10 NOTE — Patient Instructions (Signed)
Denosumab injection What is this medicine? DENOSUMAB (den oh sue mab) slows bone breakdown. Prolia is used to treat osteoporosis in women after menopause and in men, and in people who are taking corticosteroids for 6 months or more. Xgeva is used to treat a high calcium level due to cancer and to prevent bone fractures and other bone problems caused by multiple myeloma or cancer bone metastases. Xgeva is also used to treat giant cell tumor of the bone. This medicine may be used for other purposes; ask your health care provider or pharmacist if you have questions. COMMON BRAND NAME(S): Prolia, XGEVA What should I tell my health care provider before I take this medicine? They need to know if you have any of these conditions:  dental disease  having surgery or tooth extraction  infection  kidney disease  low levels of calcium or Vitamin D in the blood  malnutrition  on hemodialysis  skin conditions or sensitivity  thyroid or parathyroid disease  an unusual reaction to denosumab, other medicines, foods, dyes, or preservatives  pregnant or trying to get pregnant  breast-feeding How should I use this medicine? This medicine is for injection under the skin. It is given by a health care professional in a hospital or clinic setting. A special MedGuide will be given to you before each treatment. Be sure to read this information carefully each time. For Prolia, talk to your pediatrician regarding the use of this medicine in children. Special care may be needed. For Xgeva, talk to your pediatrician regarding the use of this medicine in children. While this drug may be prescribed for children as young as 13 years for selected conditions, precautions do apply. Overdosage: If you think you have taken too much of this medicine contact a poison control center or emergency room at once. NOTE: This medicine is only for you. Do not share this medicine with others. What if I miss a dose? It is  important not to miss your dose. Call your doctor or health care professional if you are unable to keep an appointment. What may interact with this medicine? Do not take this medicine with any of the following medications:  other medicines containing denosumab This medicine may also interact with the following medications:  medicines that lower your chance of fighting infection  steroid medicines like prednisone or cortisone This list may not describe all possible interactions. Give your health care provider a list of all the medicines, herbs, non-prescription drugs, or dietary supplements you use. Also tell them if you smoke, drink alcohol, or use illegal drugs. Some items may interact with your medicine. What should I watch for while using this medicine? Visit your doctor or health care professional for regular checks on your progress. Your doctor or health care professional may order blood tests and other tests to see how you are doing. Call your doctor or health care professional for advice if you get a fever, chills or sore throat, or other symptoms of a cold or flu. Do not treat yourself. This drug may decrease your body's ability to fight infection. Try to avoid being around people who are sick. You should make sure you get enough calcium and vitamin D while you are taking this medicine, unless your doctor tells you not to. Discuss the foods you eat and the vitamins you take with your health care professional. See your dentist regularly. Brush and floss your teeth as directed. Before you have any dental work done, tell your dentist you are   receiving this medicine. Do not become pregnant while taking this medicine or for 5 months after stopping it. Talk with your doctor or health care professional about your birth control options while taking this medicine. Women should inform their doctor if they wish to become pregnant or think they might be pregnant. There is a potential for serious side  effects to an unborn child. Talk to your health care professional or pharmacist for more information. What side effects may I notice from receiving this medicine? Side effects that you should report to your doctor or health care professional as soon as possible:  allergic reactions like skin rash, itching or hives, swelling of the face, lips, or tongue  bone pain  breathing problems  dizziness  jaw pain, especially after dental work  redness, blistering, peeling of the skin  signs and symptoms of infection like fever or chills; cough; sore throat; pain or trouble passing urine  signs of low calcium like fast heartbeat, muscle cramps or muscle pain; pain, tingling, numbness in the hands or feet; seizures  unusual bleeding or bruising  unusually weak or tired Side effects that usually do not require medical attention (report to your doctor or health care professional if they continue or are bothersome):  constipation  diarrhea  headache  joint pain  loss of appetite  muscle pain  runny nose  tiredness  upset stomach This list may not describe all possible side effects. Call your doctor for medical advice about side effects. You may report side effects to FDA at 1-800-FDA-1088. Where should I keep my medicine? This medicine is only given in a clinic, doctor's office, or other health care setting and will not be stored at home. NOTE: This sheet is a summary. It may not cover all possible information. If you have questions about this medicine, talk to your doctor, pharmacist, or health care provider.  2021 [employer] Standard (2017-12-24 16:10:44) Fulvestrant injection What is this medicine? FULVESTRANT (ful VES trant) blocks the effects of estrogen. It is used to treat breast cancer. This medicine may be used for other purposes; ask your health care provider or pharmacist if you have questions. COMMON BRAND NAME(S): FASLODEX What should I tell my health care  provider before I take this medicine? They need to know if you have any of these conditions:  bleeding disorders  liver disease  low blood counts, like low white cell, platelet, or red cell counts  an unusual or allergic reaction to fulvestrant, other medicines, foods, dyes, or preservatives  pregnant or trying to get pregnant  breast-feeding How should I use this medicine? This medicine is for injection into a muscle. It is usually given by a health care professional in a hospital or clinic setting. Talk to your pediatrician regarding the use of this medicine in children. Special care may be needed. Overdosage: If you think you have taken too much of this medicine contact a poison control center or emergency room at once. NOTE: This medicine is only for you. Do not share this medicine with others. What if I miss a dose? It is important not to miss your dose. Call your doctor or health care professional if you are unable to keep an appointment. What may interact with this medicine?  medicines that treat or prevent blood clots like warfarin, enoxaparin, dalteparin, apixaban, dabigatran, and rivaroxaban This list may not describe all possible interactions. Give your health care provider a list of all the medicines, herbs, non-prescription drugs, or dietary supplements you use.   Also tell them if you smoke, drink alcohol, or use illegal drugs. Some items may interact with your medicine. What should I watch for while using this medicine? Your condition will be monitored carefully while you are receiving this medicine. You will need important blood work done while you are taking this medicine. Do not become pregnant while taking this medicine or for at least 1 year after stopping it. Women of child-bearing potential will need to have a negative pregnancy test before starting this medicine. Women should inform their doctor if they wish to become pregnant or think they might be pregnant. There is  a potential for serious side effects to an unborn child. Men should inform their doctors if they wish to father a child. This medicine may lower sperm counts. Talk to your health care professional or pharmacist for more information. Do not breast-feed an infant while taking this medicine or for 1 year after the last dose. What side effects may I notice from receiving this medicine? Side effects that you should report to your doctor or health care professional as soon as possible:  allergic reactions like skin rash, itching or hives, swelling of the face, lips, or tongue  feeling faint or lightheaded, falls  pain, tingling, numbness, or weakness in the legs  signs and symptoms of infection like fever or chills; cough; flu-like symptoms; sore throat  vaginal bleeding Side effects that usually do not require medical attention (report to your doctor or health care professional if they continue or are bothersome):  aches, pains  constipation  diarrhea  headache  hot flashes  nausea, vomiting  pain at site where injected  stomach pain This list may not describe all possible side effects. Call your doctor for medical advice about side effects. You may report side effects to FDA at 1-800-FDA-1088. Where should I keep my medicine? This drug is given in a hospital or clinic and will not be stored at home. NOTE: This sheet is a summary. It may not cover all possible information. If you have questions about this medicine, talk to your doctor, pharmacist, or health care provider.  2021 [employer]/Gold Standard (2017-11-25 11:34:41)

## 2020-09-11 LAB — CANCER ANTIGEN 27.29: CA 27.29: 101.6 U/mL — ABNORMAL HIGH (ref 0.0–38.6)

## 2020-09-12 ENCOUNTER — Telehealth: Payer: Self-pay | Admitting: Oncology

## 2020-09-12 NOTE — Telephone Encounter (Signed)
Scheduled appts per 1/11 los. Pt confirmed appt date and times

## 2020-09-13 ENCOUNTER — Encounter: Payer: Self-pay | Admitting: Plastic Surgery

## 2020-09-13 ENCOUNTER — Ambulatory Visit (INDEPENDENT_AMBULATORY_CARE_PROVIDER_SITE_OTHER): Payer: BC Managed Care – PPO | Admitting: Plastic Surgery

## 2020-09-13 ENCOUNTER — Other Ambulatory Visit: Payer: Self-pay

## 2020-09-13 VITALS — BP 155/84 | HR 94

## 2020-09-13 DIAGNOSIS — Z9889 Other specified postprocedural states: Secondary | ICD-10-CM | POA: Diagnosis not present

## 2020-09-13 DIAGNOSIS — Z9012 Acquired absence of left breast and nipple: Secondary | ICD-10-CM | POA: Diagnosis not present

## 2020-09-13 DIAGNOSIS — Z9011 Acquired absence of right breast and nipple: Secondary | ICD-10-CM

## 2020-09-13 NOTE — Progress Notes (Signed)
   Subjective:    Patient ID: Natasha Graham, female    DOB: [birth year], 60 y.o.   MRN: [id number]  Patient is a 60 year old female here for follow-up on her breast reconstruction.  She has had bilateral breast cancer.  In 2015 she had a right mastectomy with reconstruction.  A smooth round high-profile saline implant was placed and filled to 600 cc.  A left breast reduction was done for symmetry at that time.  Then in January 2021 she had left breast cancer and underwent a mastectomy with expander placement.  She finished radiation in June.  Left breast expander has 310 cc / 455cc.  It is quite tight and the skin appears to be pretty thin.  There are still minor changes from the radiation on her skin but no sign of infection.     Review of Systems  Constitutional: Negative.  Negative for activity change and appetite change.  Eyes: Negative.   Respiratory: Negative.  Negative for chest tightness.   Cardiovascular: Negative.   Gastrointestinal: Negative.   Endocrine: Negative.   Genitourinary: Negative.   Musculoskeletal: Negative.   Hematological: Negative.   Psychiatric/Behavioral: Negative.        Objective:   Physical Exam Vitals and nursing note reviewed.  Constitutional:      Appearance: Normal appearance.  HENT:     Head: Normocephalic and atraumatic.  Cardiovascular:     Rate and Rhythm: Normal rate.     Pulses: Normal pulses.  Pulmonary:     Effort: Pulmonary effort is normal.  Skin:    General: Skin is warm.     Capillary Refill: Capillary refill takes less than 2 seconds.  Neurological:     General: No focal deficit present.     Mental Status: She is alert. Mental status is at baseline.  Psychiatric:        Mood and Affect: Mood normal.        Behavior: Behavior normal.        Thought Content: Thought content normal.        Assessment & Plan:     ICD-10-CM   1. S/P mastectomy, left  Z90.12   2. Status post right breast reconstruction  Z98.890   3.  Acquired absence of right breast  Z90.11     We talked about options for her next procedure.  I am very concerned about stretching her any more on that left side.  She agrees.  Option 1 is removal of left breast expander and placement of an implant.  Then wait a few months to make sure everything is okay and swap the right breast implant out for a smaller one.  Option 2 exchange both sides for smaller implants.  Option 3 left latissimus muscle flap to try to get her to match the right side.  The patient is going to think about it.  Generally she does not want to do a latissimus muscle flap unless it is absolutely necessary.  I think that is a good idea.  We will plan to see her back in several months to reevaluate.  Pictures were obtained of the patient and placed in the chart with the patient's or guardian's permission.

## 2020-10-02 ENCOUNTER — Encounter (HOSPITAL_COMMUNITY)
Admission: RE | Admit: 2020-10-02 | Discharge: 2020-10-02 | Disposition: A | Discharge status: Home / Self Care | Payer: BC Managed Care – PPO | Source: Ambulatory Visit | Attending: Oncology | Admitting: Oncology

## 2020-10-02 ENCOUNTER — Other Ambulatory Visit: Payer: Self-pay

## 2020-10-02 ENCOUNTER — Ambulatory Visit (HOSPITAL_COMMUNITY): Payer: BC Managed Care – PPO

## 2020-10-02 DIAGNOSIS — K760 Fatty (change of) liver, not elsewhere classified: Secondary | ICD-10-CM | POA: Insufficient documentation

## 2020-10-02 DIAGNOSIS — I700 Atherosclerosis of aorta: Secondary | ICD-10-CM | POA: Insufficient documentation

## 2020-10-02 DIAGNOSIS — C50912 Malignant neoplasm of unspecified site of left female breast: Secondary | ICD-10-CM | POA: Diagnosis not present

## 2020-10-02 DIAGNOSIS — Z9012 Acquired absence of left breast and nipple: Secondary | ICD-10-CM | POA: Insufficient documentation

## 2020-10-02 DIAGNOSIS — C7951 Secondary malignant neoplasm of bone: Secondary | ICD-10-CM | POA: Diagnosis not present

## 2020-10-02 DIAGNOSIS — C50512 Malignant neoplasm of lower-outer quadrant of left female breast: Secondary | ICD-10-CM | POA: Diagnosis not present

## 2020-10-02 DIAGNOSIS — Z170 Estrogen receptor positive status [ER+]: Secondary | ICD-10-CM | POA: Insufficient documentation

## 2020-10-02 DIAGNOSIS — K7689 Other specified diseases of liver: Secondary | ICD-10-CM | POA: Insufficient documentation

## 2020-10-02 MED ORDER — TECHNETIUM TC 99M MEDRONATE IV KIT
21.6000 | PACK | Freq: Once | INTRAVENOUS | Status: AC | PRN
  Administered 2020-10-02: 21.6 via INTRAVENOUS

## 2020-10-02 MED FILL — IBRANCE 125 MG TABS: 125 | 28 days supply | Qty: 21 | Fill #0

## 2020-10-07 ENCOUNTER — Inpatient Hospital Stay: Payer: BC Managed Care – PPO

## 2020-10-07 ENCOUNTER — Other Ambulatory Visit: Payer: Self-pay | Admitting: Oncology

## 2020-10-07 ENCOUNTER — Inpatient Hospital Stay: Payer: BC Managed Care – PPO | Attending: Oncology

## 2020-10-07 ENCOUNTER — Other Ambulatory Visit: Payer: Self-pay

## 2020-10-07 ENCOUNTER — Encounter: Payer: Self-pay | Admitting: Pharmacist

## 2020-10-07 ENCOUNTER — Inpatient Hospital Stay (HOSPITAL_BASED_OUTPATIENT_CLINIC_OR_DEPARTMENT_OTHER): Payer: BC Managed Care – PPO | Admitting: Oncology

## 2020-10-07 VITALS — BP 146/61 | HR 107 | Temp 98.3°F | Resp 18 | Ht 64.0 in | Wt 169.5 lb

## 2020-10-07 DIAGNOSIS — C50512 Malignant neoplasm of lower-outer quadrant of left female breast: Secondary | ICD-10-CM

## 2020-10-07 DIAGNOSIS — N6091 Unspecified benign mammary dysplasia of right breast: Secondary | ICD-10-CM

## 2020-10-07 DIAGNOSIS — Z803 Family history of malignant neoplasm of breast: Secondary | ICD-10-CM | POA: Diagnosis not present

## 2020-10-07 DIAGNOSIS — Z170 Estrogen receptor positive status [ER+]: Secondary | ICD-10-CM

## 2020-10-07 DIAGNOSIS — Z801 Family history of malignant neoplasm of trachea, bronchus and lung: Secondary | ICD-10-CM | POA: Insufficient documentation

## 2020-10-07 DIAGNOSIS — Z5111 Encounter for antineoplastic chemotherapy: Secondary | ICD-10-CM | POA: Diagnosis not present

## 2020-10-07 DIAGNOSIS — C50912 Malignant neoplasm of unspecified site of left female breast: Secondary | ICD-10-CM

## 2020-10-07 DIAGNOSIS — R978 Other abnormal tumor markers: Secondary | ICD-10-CM | POA: Insufficient documentation

## 2020-10-07 DIAGNOSIS — K760 Fatty (change of) liver, not elsewhere classified: Secondary | ICD-10-CM

## 2020-10-07 DIAGNOSIS — C7951 Secondary malignant neoplasm of bone: Secondary | ICD-10-CM | POA: Insufficient documentation

## 2020-10-07 DIAGNOSIS — Z800 Family history of malignant neoplasm of digestive organs: Secondary | ICD-10-CM | POA: Diagnosis not present

## 2020-10-07 DIAGNOSIS — Z853 Personal history of malignant neoplasm of breast: Secondary | ICD-10-CM | POA: Insufficient documentation

## 2020-10-07 DIAGNOSIS — Z808 Family history of malignant neoplasm of other organs or systems: Secondary | ICD-10-CM | POA: Diagnosis not present

## 2020-10-07 DIAGNOSIS — D701 Agranulocytosis secondary to cancer chemotherapy: Secondary | ICD-10-CM | POA: Diagnosis not present

## 2020-10-07 DIAGNOSIS — I700 Atherosclerosis of aorta: Secondary | ICD-10-CM

## 2020-10-07 DIAGNOSIS — Z9013 Acquired absence of bilateral breasts and nipples: Secondary | ICD-10-CM | POA: Diagnosis not present

## 2020-10-07 DIAGNOSIS — Z9012 Acquired absence of left breast and nipple: Secondary | ICD-10-CM | POA: Diagnosis not present

## 2020-10-07 LAB — CBC WITH DIFFERENTIAL/PLATELET
Abs Immature Granulocytes: 0.01 10*3/uL (ref 0.00–0.07)
Basophils Absolute: 0 10*3/uL (ref 0.0–0.1)
Basophils Relative: 2 %
Eosinophils Absolute: 0 10*3/uL (ref 0.0–0.5)
Eosinophils Relative: 1 %
HCT: 34.6 % — ABNORMAL LOW (ref 36.0–46.0)
Hemoglobin: 12.4 g/dL (ref 12.0–15.0)
Immature Granulocytes: 1 %
Lymphocytes Relative: 45 %
Lymphs Abs: 0.9 10*3/uL (ref 0.7–4.0)
MCH: 33.6 pg (ref 26.0–34.0)
MCHC: 35.8 g/dL (ref 30.0–36.0)
MCV: 93.8 fL (ref 80.0–100.0)
Monocytes Absolute: 0.2 10*3/uL (ref 0.1–1.0)
Monocytes Relative: 7 %
Neutro Abs: 0.9 10*3/uL — ABNORMAL LOW (ref 1.7–7.7)
Neutrophils Relative %: 44 %
Platelets: 216 10*3/uL (ref 150–400)
RBC: 3.69 MIL/uL — ABNORMAL LOW (ref 3.87–5.11)
RDW: 13.9 % (ref 11.5–15.5)
WBC: 2 10*3/uL — ABNORMAL LOW (ref 4.0–10.5)
nRBC: 0 % (ref 0.0–0.2)

## 2020-10-07 LAB — COMPREHENSIVE METABOLIC PANEL
ALT: 26 U/L (ref 0–44)
AST: 18 U/L (ref 15–41)
Albumin: 4.3 g/dL (ref 3.5–5.0)
Alkaline Phosphatase: 69 U/L (ref 38–126)
Anion gap: 12 (ref 5–15)
BUN: 10 mg/dL (ref 6–20)
CO2: 27 mmol/L (ref 22–32)
Calcium: 9.3 mg/dL (ref 8.9–10.3)
Chloride: 102 mmol/L (ref 98–111)
Creatinine, Ser: 0.78 mg/dL (ref 0.44–1.00)
GFR, Estimated: >60 mL/min (ref >60)
Glucose, Bld: 132 mg/dL — ABNORMAL HIGH (ref 70–99)
Potassium: 3.7 mmol/L (ref 3.5–5.1)
Sodium: 141 mmol/L (ref 135–145)
Total Bilirubin: 0.4 mg/dL (ref 0.3–1.2)
Total Protein: 7.4 g/dL (ref 6.5–8.1)

## 2020-10-07 MED ORDER — DENOSUMAB 120 MG/1.7ML ~~LOC~~ SOLN
SUBCUTANEOUS | Status: AC
  Filled 2020-10-07: qty 1.7

## 2020-10-07 MED ORDER — PALBOCICLIB 100 MG PO TABS: 100.0000 mg | ORAL_TABLET | Freq: Every day | ORAL | 6 refills | Status: DC

## 2020-10-07 MED ORDER — DENOSUMAB 120 MG/1.7ML ~~LOC~~ SOLN
120.0000 mg | Freq: Once | SUBCUTANEOUS | Status: AC
  Administered 2020-10-07: 120 mg via SUBCUTANEOUS

## 2020-10-07 MED ORDER — FULVESTRANT 250 MG/5ML IM SOLN
INTRAMUSCULAR | Status: AC
  Filled 2020-10-07: qty 10

## 2020-10-07 MED ORDER — FULVESTRANT 250 MG/5ML IM SOLN
500.0000 mg | Freq: Once | INTRAMUSCULAR | Status: AC
  Administered 2020-10-07: 500 mg via INTRAMUSCULAR

## 2020-10-07 NOTE — Progress Notes (Signed)
[facility]  [phone] [phone]     ID: Natasha Graham DOB: July 22, 1961  MR#: [id number]  QJF#:[id number]  Patient Care Team: Leonides Sake, MD as PCP - General (Family Medicine) Rolm Bookbinder, MD as Consulting Physician (General Surgery) Latacha Texeira, Virgie Dad, MD as Consulting Physician (Oncology) Mauro Kaufmann, RN as Registered Nurse Rockwell Germany, RN as Registered Nurse Dillingham, Loel Lofty, DO as Attending Physician (Plastic Surgery) Gery Pray, MD as Consulting Physician (Radiation Oncology) OTHER MD: Sherrie Mustache MD   CHIEF COMPLAINT: new estrogen receptor positive stage IV breast cancer; remote noninvasive right breast cancer (s/p bilateral mastectomies)  CURRENT TREATMENT: Denosumab/Xgeva; fulvestrant; palbociclib/Ibrance   INTERVAL HISTORY: Natasha Graham returns today for follow up and treatment of her newly diagnosed invasive left breast cancer.  She continues on fulvestrant.  She has had no problems from this including no significant discomfort from the actual injections  She also received denosumab, most recently on 09/10/2020.  She did not experience any bony aches or any symptoms suggestive of hypocalcemia  Since her last visit, she started on Ibrance on 09/12/2020.  She also underwent bone scan on 10/02/2020 showing: diffuse sclerotic skeletal metastasis identified on CT from 07/2020 are not well represented on bone scan. [place] PET scan is recommended.   REVIEW OF SYSTEMS: Natasha Graham has absolutely no symptoms related to her cancer, and specifically no pain, fatigue, sweats or fever.  She also is tolerating particularly the Ibrance remarkably well.  The only concern there is low counts as discussed below.  Overall a detailed review of systems today was benign   COVID 19 VACCINATION STATUS: Patient had antibodies to the virus documented November 2020.  She is not planning on vaccinations   LEFT BREAST CANCER  HISTORY: From the original intake note:  She had routine screening left mammogram on 06/14/2019 showing a developing asymmetry. She presented for left diagnostic mammogram and left breast ultrasound on 07/07/2019. Physical exam that day showed a palpable, firm, superficial mass in the left breast at 6 o'clock along the site of reduction mammoplasty scar (performed in 2015 with right mastectomy). Scans showed: breast density category B; 2.1 cm superficial mass involving the skin in the left breast at 6 o'clock; no enlarged adenopathy in the left axilla.   Accordingly on 07/12/2019 she proceeded to biopsy of the left breast area in question. The pathology from this procedure ([id number]) showed: invasive lobular carcinoma, grade 1, with perineural invasion present. Prognostic indicators significant for: estrogen receptor, 60% positive with moderate staining intensity and progesterone receptor, 0% negative. Proliferation marker Ki67 at <1%. HER2 equvocal by immunohistochemistry (2+), but negative by fluorescent in situ hybridization with a signals ratio 1.18 and number per cell 1.95.  She opted to proceed with left mastectomy on 09/27/2019 under Dr. Donne Hazel with immediate reconstruction under Dr. Marla Roe. Pathology from the procedure [id number]) revealed: invasive lobular carcinoma, grade 2, 6.2 cm, focally involving anterior margin and involving skin dermis without involvement of epidermis; lobular carcinoma in situ with pagetoid spread; small focus of low grade ductal carcinoma in situ, 0.2 cm.  Two lymph nodes were biopsied. One showed metastatic lobular carcinoma with focus measuring 2.1 cm with evidence of extranodal extension. The other lymph node also showed metastatic lobular carcinoma with no lymphoid tissue present.  Biopsy of the nipple was also taken at that time and showed no evidence of invasive carcinoma.  Mammaprint was performed on the final surgical sample. This returned showing  low risk.  RIGHT BREAST CANCER  HISTORY: Natasha Graham has a history of right breast biopsy in late 2007 showing atypical ductal hyperplasia in the upper outer right breast.. Breast MRI 07/13/2006 showed postbiopsy changes in the upper right breast, and some right breast cysts. There were no solid masses however or areas of abnormal enhancement. There were no enlarged lymph nodes. Incidental finding of liver cysts was made. On 07/30/2006 she underwent needle localization of the calcifications in the outer right breast which also showed ([id number]) atypical ductal hyperplasia, with no malignancy.   On 10/21/2012 routine digital bilateral screening mammography at the breast Center showed breast density to be category C. In the right breast there were calcifications and architectural distortion to go with skin retraction. Additional views obtained 11/17/2012 showed extensive but stable punctate microcalcifications in the upper outer right breast. Many of these were superficial and not amenable to stereotactic biopsy. MRI was suggested but the patient was unable to undergo that test and therefore six-month mammography was obtained 05/15/2013. This showed the calcifications in the outer right breast to have been stable. Because of their extents and because of their superficial location they could not be all sampled all removed with biopsy. MRI was again discussed, but the patient has Harrington rods in place and also felt the cost of MRI was prohibitive.  On 11/13/2013 further six-month follow-up showed a grouping of intermediate microcalcifications in the lateral right breast and these were biopsied 11/20/2013. The result of that procedure ([id number]) showed atypical ductal hyperplasia. Accordingly on 02/23/2014 the patient underwent right lumpectomy, with the pathology ([id number]) showing ductal carcinoma in situ, grade 2, measuring 0.7 cm. Estrogen receptor was 100% positive. Progesterone receptor was 96%  positive. Both showed strong staining intensity. Margins were clear but the closest margin anteriorly was at 1 mm.  On 03/07/2014 the patient underwent postoperative mammography which showed post lumpectomy changes but also calcifications extending from the lumpectomy site anteriorly towards the nipple. On 03/12/2014 the patient underwent bilateral breast MRI. Breast composition here was described as category B. In the right breast there was a 4.8 cm fluidlike excisional cavity in the lower outer right breast. Adjacent to this was a focus of non-masslike enhancement measuring 1.9 cm. This is an area associated with the scar from the excision in 2007. There was also non-masslike enhancement inferior to the biopsy cavity measuring 3.8 cm. There were no abnormal lymph nodes and no masses or abnormal enhancement in the left breast. Specifically the left breast lower outer quadrant was unchanged from 2007 MRI.  Overall it was felt the linear non-masslike enhancement extending almost 4 cm inferior to the excisional cavity was sufficiently suspicious to warrant biopsy, and this was performed 03/29/2014. It again showed ductal carcinoma in situ, grade 1, estrogen receptor and progesterone receptor both 100% positive with strong staining intensity.  At this point the patient met with surgery and opted for definitive right mastectomy with immediate reconstruction. On 05/16/2014 she underwent simple mastectomy with sentinel lymph node sampling. The pathology ([id number]) showed atypical ductal hyperplasia but no evidence of neoplasia. All 5 sentinel lymph nodes were clear. The patient had immediate right breast reconstruction with tissue expander and dermal matrix placement.  She is scheduled for definitive implant placement 08/08/2014.  Her subsequent history is as detailed below   PAST MEDICAL HISTORY: Past Medical History:  Diagnosis Date  . Cancer [facility])    breast cancer  . Family history of brain cancer    . Family history of breast cancer   . Family  history of colon cancer   . Family history of stomach cancer   . Hypertension   . PONV (postoperative nausea and vomiting)   . Scoliosis   . Wears glasses     PAST SURGICAL HISTORY: Past Surgical History:  Procedure Laterality Date  . AUGMENTATION MAMMAPLASTY Right    2015 post mastectomy  . AXILLARY LYMPH NODE DISSECTION Left 11/13/2019   Procedure: LEFT AXILLARY LYMPH NODE DISSECTION;  Surgeon: Rolm Bookbinder, MD;  Location: [place];  Service: General;  Laterality: Left;  . BREAST RECONSTRUCTION WITH PLACEMENT OF TISSUE EXPANDER AND FLEX HD (ACELLULAR HYDRATED DERMIS) Right 05/16/2014   Procedure: IMMEDIATE RIGHT BREAST RECONSTRUCTION WITH PLACEMENT OF TISSUE EXPANDER AND FLEX HD (ACELLULAR HYDRATED DERMIS);  Surgeon: Theodoro Kos, DO;  Location: [place];  Service: Plastics;  Laterality: Right;  . BREAST RECONSTRUCTION WITH PLACEMENT OF TISSUE EXPANDER AND FLEX HD (ACELLULAR HYDRATED DERMIS) Left 09/27/2019   Procedure: LEFT BREAST RECONSTRUCTION WITH PLACEMENT OF TISSUE EXPANDER AND FLEX HD (ACELLULAR HYDRATED DERMIS);  Surgeon: Wallace Going, DO;  Location: [place];  Service: Plastics;  Laterality: Left;  . BREAST SURGERY     right breast excisional biopsy  . DILATION AND CURETTAGE OF UTERUS    . HYSTEROSCOPY WITH D & C N/A 09/20/2015   Procedure: DILATATION AND CURETTAGE /HYSTEROSCOPY;  Surgeon: Brien Few, MD;  Location: [facility];  Service: Gynecology;  Laterality: N/A;  . LIPOSUCTION Bilateral 08/08/2014   Procedure: LIPO SUCTION ;  Surgeon: Theodoro Kos, DO;  Location: [place];  Service: Plastics;  Laterality: Bilateral;  . MASTECTOMY Right 05/16/2014   placement of acellular dermal matrix & tissue expanders   . MASTOPEXY Left 08/08/2014   Procedure:  MASTOPEXY FOR SYMMETRY;  Surgeon: Theodoro Kos, DO;  Location: [place];  Service: Plastics;  Laterality:  Left;  . NIPPLE SPARING MASTECTOMY WITH SENTINEL LYMPH NODE BIOPSY Left 09/27/2019   Procedure: LEFT NIPPLE SPARING MASTECTOMY WITH LEFT AXILLARY SENTINEL LYMPH NODE BIOPSY;  Surgeon: Rolm Bookbinder, MD;  Location: [place];  Service: General;  Laterality: Left;  . REDUCTION MAMMAPLASTY Left    2015  . REMOVAL OF TISSUE EXPANDER AND PLACEMENT OF IMPLANT Right 08/08/2014   Procedure: REMOVAL OF RIGHT  TISSUE EXPANDERS WITH PLACEMENT OF RIGHT BREAST IMPLANTS WITH LIPO SUCTION ;  Surgeon: Theodoro Kos, DO;  Location: [facility];  Service: Plastics;  Laterality: Right;  . SCAR REVISION Left 11/13/2019   Procedure: EXCISION OF LEFT MASTECTOMY SKIN;  Surgeon: Rolm Bookbinder, MD;  Location: [place];  Service: General;  Laterality: Left;  . scoliosis  1972   harrington rods-age 7  . TONSILLECTOMY      FAMILY HISTORY Family History  Problem Relation Age of Onset  . Heart disease Mother   . Cancer Father        liver  . Colon cancer Father 36  . Head & neck cancer Father        oral cancer - dx in 70s-50s  . Breast cancer Paternal Aunt        dx <50  . Brain cancer Brother        possible meningioma  . Cancer Maternal Aunt        liver/lung cancer  . Stomach cancer Paternal Uncle   . Heart disease Maternal Grandmother   . Lymphoma Maternal Grandfather        NHL  . Heart attack Paternal Grandfather   .  Cancer Paternal Uncle        NOS   there is significant cancer history on the paternal side, her father dying at the age of 9 with what the patient tells me was metastatic head and neck cancer (but also involving the colon and liver). One of the patient's father's brothers had stomach cancer, and another had a brain tumor, and 2 of the patient's father's sisters had breast cancer, 1 diagnosed at age 79, the other at age 50.  On the mother's side there is a history of non-Hodgkin's lymphoma at age 47. There is no history of ovarian  cancer in the family   GYNECOLOGIC HISTORY:  Patient's last menstrual period was 10/16/2013.  menarche age 62, first live birth age 28. The patient is GX P3. She stopped having periods in October 2014. She did not take hormone replacement. She did take birth control pills for approximately 1 year remotely, with no complications.   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 She works as an Web designer. Son Truman Hayward drives a truck for the family business. Daughter Thayer Headings is a Forensic psychologist and currently works as an IT trainer in [place].  Daughter Roselyn Reef is studying history and English at [employer]. The patient has 2 granddaughters aged 80 and 1 as of May 2021. She attends [employer]    ADVANCED DIRECTIVES: In the absence of any documents to the contrary the patient's husband is her healthcare power of attorney   HEALTH MAINTENANCE: Social History   Tobacco Use  . Smoking status: Never Smoker  . Smokeless tobacco: Never Used  Vaping Use  . Vaping Use: Never used  Substance Use Topics  . Alcohol use: No  . Drug use: No     Colonoscopy:  2013  PAP: March 2015  Bone density:  Lipid panel:  No Known Allergies  Current Outpatient Medications  Medication Sig Dispense Refill  . acetaminophen (TYLENOL) 500 MG tablet Take by mouth.    . Ascorbic Acid (VITAMIN C) 100 MG tablet Take 100 mg by mouth daily.    . Flaxseed Oil (LINSEED OIL) OIL by Misc.(Non-Drug; Combo Route) route.    . Ginger, Zingiber officinalis, (GINGER PO) Take by mouth.    Marland Kitchen ibuprofen (ADVIL) 200 MG tablet Take by mouth.    Marland Kitchen lisinopril-hydrochlorothiazide (PRINZIDE,ZESTORETIC) 20-25 MG per tablet Take 1 tablet by mouth every evening.     . Magnesium 250 MG TABS Take by mouth.    . Multiple Vitamin (MULTIVITAMIN) capsule Take by mouth.    . palbociclib (IBRANCE) 100 MG tablet Take 1 tablet (100 mg total) by  mouth daily. Take for 21 days on, 7 days off, repeat every 28 days. Start with March 2022 cycle 21 tablet 6  . Turmeric 500 MG CAPS Take by mouth.     No current facility-administered medications for this visit.    OBJECTIVE: White woman who appears well Vitals:   10/07/20 1310  BP: (!) 146/61  Pulse: (!) 107  Resp: 18  Temp: 98.3 F (36.8 C)  SpO2: 100%     Body mass index is 29.09 kg/m.    ECOG FS:1 - Symptomatic but completely ambulatory  Sclerae unicteric, EOMs intact Wearing a mask No cervical or supraclavicular adenopathy Lungs no rales or rhonchi Heart regular rate and rhythm Abd soft, nontender, positive bowel sounds MSK no focal spinal tenderness, no upper extremity lymphedema Neuro: nonfocal, well  oriented, appropriate affect Breasts: Status post bilateral mastectomies.  There is no evidence of local recurrence.  Both axillae are benign.   LAB RESULTS:  CMP     Component Value Date/Time   NA 141 10/07/2020 1250   NA 143 07/30/2014 1603   K 3.7 10/07/2020 1250   K 3.5 07/30/2014 1603   CL 102 10/07/2020 1250   CO2 27 10/07/2020 1250   CO2 30 (H) 07/30/2014 1603   GLUCOSE 132 (H) 10/07/2020 1250   GLUCOSE 93 07/30/2014 1603   BUN 10 10/07/2020 1250   BUN 10.5 07/30/2014 1603   CREATININE 0.78 10/07/2020 1250   CREATININE 0.9 07/30/2014 1603   CALCIUM 9.3 10/07/2020 1250   CALCIUM 9.5 07/30/2014 1603   PROT 7.4 10/07/2020 1250   PROT 7.0 07/30/2014 1603   ALBUMIN 4.3 10/07/2020 1250   ALBUMIN 4.0 07/30/2014 1603   AST 18 10/07/2020 1250   AST 23 07/30/2014 1603   ALT 26 10/07/2020 1250   ALT 23 07/30/2014 1603   ALKPHOS 69 10/07/2020 1250   ALKPHOS 78 07/30/2014 1603   BILITOT 0.4 10/07/2020 1250   BILITOT 0.29 07/30/2014 1603   GFRNONAA >60 10/07/2020 1250   GFRAA >60 04/29/2020 0829    INo results found for: SPEP, UPEP  Lab Results  Component Value Date   WBC 2.0 (L) 10/07/2020   NEUTROABS 0.9 (L) 10/07/2020   HGB 12.4 10/07/2020   HCT  34.6 (L) 10/07/2020   MCV 93.8 10/07/2020   PLT 216 10/07/2020      Chemistry      Component Value Date/Time   NA 141 10/07/2020 1250   NA 143 07/30/2014 1603   K 3.7 10/07/2020 1250   K 3.5 07/30/2014 1603   CL 102 10/07/2020 1250   CO2 27 10/07/2020 1250   CO2 30 (H) 07/30/2014 1603   BUN 10 10/07/2020 1250   BUN 10.5 07/30/2014 1603   CREATININE 0.78 10/07/2020 1250   CREATININE 0.9 07/30/2014 1603      Component Value Date/Time   CALCIUM 9.3 10/07/2020 1250   CALCIUM 9.5 07/30/2014 1603   ALKPHOS 69 10/07/2020 1250   ALKPHOS 78 07/30/2014 1603   AST 18 10/07/2020 1250   AST 23 07/30/2014 1603   ALT 26 10/07/2020 1250   ALT 23 07/30/2014 1603   BILITOT 0.4 10/07/2020 1250   BILITOT 0.29 07/30/2014 1603      No results found for: LABCA2  No components found for: LABCA125  No results for input(s): INR in the last 168 hours.  Urinalysis No results found for: COLORURINE, APPEARANCEUR, LABSPEC, PHURINE, GLUCOSEU, HGBUR, BILIRUBINUR, KETONESUR, PROTEINUR, UROBILINOGEN, NITRITE, LEUKOCYTESUR   STUDIES: NM Bone Scan Whole Body  Result Date: 10/03/2020 CLINICAL DATA:  LEFT breast carcinoma.  Estrogen receptor positive EXAM: NUCLEAR MEDICINE WHOLE BODY BONE SCAN TECHNIQUE: Whole body anterior and posterior images were obtained approximately 3 hours after intravenous injection of radiopharmaceutical. RADIOPHARMACEUTICALS:  21.6 mCi Technetium-61mMDP IV COMPARISON:  CT 08/02/2020 FINDINGS: There is diffuse stippled sclerotic metastasis throughout the entirety of the visualized bones on comparison CT. These lesions are not well represented by the MDP bone scan. There is prominent uptake in the long bones which could indicate a super scan pattern however there is significant radiotracer excretion by the kidneys which is not typical for super scan. IMPRESSION: Diffuse sclerotic skeletal metastasis identified on CT from 08/03/2019 are not well represented on MDP bone scan as  described above. Consider F 18 estradiol PET-CT scan for potential  estrogen receptor-positive bone metastasis. Electronically Signed   By: Suzy Bouchard M.D.   On: 10/03/2020 08:12       ASSESSMENT: 60 y.o. [place], [place] woman  RIGHT BREAST CANCER (1) status post right breast upper-outer quadrant lumpectomy 07/30/2006 showing atypical ductal hyperplasia.  (2) right breast upper outer quadrant biopsy 11/20/2013 showed atypical ductal hyperplasia   (3) right lumpectomy 02/26/2014 showed ductal carcinoma in situ measuring 0.7 cm, estrogen receptor 100% positive, progesterone receptor 96% positive, with 1 mm margins  (4) right breast lower inner quadrant biopsy 03/29/2014 showed ductal carcinoma in situ, 100% estrogen receptor positive, 100% progesterone receptor positive,  (5) status post right mastectomy with sentinel lymph node sampling showing only atypical ductal hyperplasia    (a) five sentinel lymph nodes removed, all clear  (b) status post right saline implant reconstruction  (6) the patient opted against prophylactic antiestrogens   LEFT BREAST CANCER (7) status post left breast lower outer quadrant biopsy 07/12/2019 for a clinical T1N0, stage IA invasive lobular carcinoma, grade 1, with evidence of perineural invasion, estrogen receptor moderately positive, progesterone receptor negative, HER-2 not amplified, with an MIB-1 of less than 1%  (8) status post left nipple sparing mastectomy 09/27/2019 for a pT3 pN1, stage IIIA invasive lobular carcinoma, grade 2, with a negative nipple biopsy and a focally positive anterior margin  (a) 1 sentinel lymph node had a 2.1 cm tumor deposit; a second positive "lymph node" had no lymph node tissue  (b) immediate expander placement  (c) status post left axillary lymph node dissection and margin excision 11/13/2019, no residual disease   (i) 7 of 7 left axillary lymph nodes negative for carcinoma (total 8 nodes removed)  (d) repeat prognostic  panel again estrogen receptor 80% positive with moderate staining, progesterone receptor negative    (9) MammaPrint "low risk" predicts no significant benefit from chemotherapy.   (10) adjuvant radiation 12/26/2019 - 02/06/2020 Site Technique Total Dose (Gy) Dose per Fx (Gy) Completed Fx Beam Energies  Chest Wall, Left: CW_Lt_Bst Electron 10/10 2 5/5 6E  Sclav-LT: SCV_Lt Complex 50/50 2 25/25 6X, 15X  Sclav-LT: SCV_Lt_Bst Complex 8/8 2 4/4 6X, 15X  Chest Wall, Left: CW_Lt 3D 50/50 2 25/25 6X, 10X, 15X   (11) staging studies:   (a) chest, abdomen and pelvic CT with contrast 10/31/2019 shows mild hepatic steatosis, small uterine fibroids, 0.3 cm left upper lobe nodule, but no evidence of metastatic disease.  (b) F18 estradiol PET scan 11/21/2019 shows a chain of very small lymph nodes from the subpectoralis nodal station to the left supraclavicular nodal station positive for ER accumulation with SUVs in the 4 range (versus mediastinal blood pool 1.4). no evidence of lung, bone, or liver involvement.   (12) tamoxifen started 02/29/2020, discontinued DEC 2021 with recurrence  (13) genetics testing 06/27/2020 through the CancerNext-Expanded gene panel offered by [facility] found no deleterious mutations in AIP, ALK, APC*, ATM*, AXIN2, BAP1, BARD1, BLM, BMPR1A, BRCA1*, BRCA2*, BRIP1*, CDC73, CDH1*, CDK4, CDKN1B, CDKN2A, CHEK2*, CTNNA1, DICER1, FANCC, FH, FLCN, GALNT12, KIF1B, LZTR1, MAX, MEN1, MET, MLH1*, MSH2*, MSH3, MSH6*, MUTYH*, NBN, NF1*, NF2, NTHL1, PALB2*, PHOX2B, PMS2*, POT1, PRKAR1A, PTCH1, PTEN*, RAD51C*, RAD51D*, RB1, RECQL, RET, SDHA, SDHAF2, SDHB, SDHC, SDHD, SMAD4, SMARCA4, SMARCB1, SMARCE1, STK11, SUFU, TMEM127, TP53*, TSC1, TSC2, VHL and XRCC2 (sequencing and deletion/duplication); EGFR, EGLN1, HOXB13, KIT, MITF, PDGFRA, POLD1, and POLE (sequencing only); EPCAM and GREM1 (deletion/duplication only). DNA and RNA analyses performed for * genes. The report date is 06/27/2020.  (14)  reconstruction  in process  METASTATIC DISEASE: December 2021 (15) chest and neck CT with contrast 08/02/2020 shows diffuse bony sclerosis consistent with metastatic disease, no evidence of visceral disease  (a) CA 27-29 on 08/05/2020 showed 98.5  (b) bone marrow biopsy 08/06/2020 showed metastatic carcinoma, estrogen receptor positive (75% moderate/strong), progesterone receptor and HER-2 negative.  (16) denosumab/Xgeva started 08/15/2020  (17) fulvestrant started 08/15/2020  (a) palbociclib started 09/12/2020 at 125 mg per day, 21/7  (b) palbociclib dose to be decreased to 100 mg daily starting with March 2022 cycle  PLAN: Natasha Graham has no symptoms related to her cancer and she is tolerating her treatment quite well.  She understands that it is very difficult to follow bone only disease.  We will be serially monitoring her CA 27-29.  The bone scan was not as helpful as I expected.  I think repeating a series and a scan sometime in March probably would be a good idea.  Her ANC today, on her off week, was only 0.8.  I am going to go ahead and give her the next cycle at the same dose but drop the dose 200 mg with cycle #3, the March cycle.  Those orders are ready in.  She will see me again in 4 weeks.  She knows to call for any other issue that may develop before the next visit  Total encounter time 30 minutes.Sarajane Jews C. Mi Balla, MD 10/07/2020 6:25 PM  Oncology and Hematology [facility] [place], [place] [address] Tel. [phone]  Joylene Igo [phone]   I, Wilburn Mylar, am acting as scribe for Dr. Sarajane Jews C. Mardie Kellen.  I, Lurline Del MD, have reviewed the above documentation for accuracy and completeness, and I agree with the above.   *Total Encounter Time as defined by the Centers for Medicare and Medicaid Services includes, in addition to the face-to-face time of a patient visit (documented in the note above) non-face-to-face time: obtaining and  reviewing outside history, ordering and reviewing medications, tests or procedures, care coordination (communications with other health care professionals or caregivers) and documentation in the medical record.

## 2020-10-07 NOTE — Progress Notes (Signed)
Oncology Clinical Pharmacist Note   Natasha Graham is a 60 y.o. female with a diagnosis of breast cancer currently on palbociclib under the care of Dr. Lurline Del.  She also receives every 28 day fulvestrant and denosumab which are being given today. [name] was seen today by Dr. Jana Hakim in clinic and labs were reviewed.  Clinical pharmacy discussed estimated [place] of 900 today which manufacturer guidelines recommend holding for 1 week with repeat labs if day 1 of cycle. Discussed with Dr. Jana Hakim. He states patient is currently on her off week and will restart her next cycle tentatively Thursday 10/10/20 so should have time for her [place] to likely recover to 1000 or greater. Patient already has picked up current palbociclib dose of 125 mg and will continue on this dose.  However, her future cycles will be dose reduced to 100 mg PO daily for 21 days, followed by a 7 day rest period. She will have repeat labs at her next scheduled visit with Dr. Jana Hakim.  Clinical pharmacy will continue to support [name] and Dr. Jana Hakim as needed.  Raina Mina, [place],  10/07/2020  2:14 PM

## 2020-10-07 NOTE — Patient Instructions (Addendum)
Fulvestrant injection What is this medicine? FULVESTRANT (ful VES trant) blocks the effects of estrogen. It is used to treat breast cancer. This medicine may be used for other purposes; ask your health care provider or pharmacist if you have questions. COMMON BRAND NAME(S): FASLODEX What should I tell my health care provider before I take this medicine? They need to know if you have any of these conditions:  bleeding disorders  liver disease  low blood counts, like low white cell, platelet, or red cell counts  an unusual or allergic reaction to fulvestrant, other medicines, foods, dyes, or preservatives  pregnant or trying to get pregnant  breast-feeding How should I use this medicine? This medicine is for injection into a muscle. It is usually given by a health care professional in a hospital or clinic setting. Talk to your pediatrician regarding the use of this medicine in children. Special care may be needed. Overdosage: If you think you have taken too much of this medicine contact a poison control center or emergency room at once. NOTE: This medicine is only for you. Do not share this medicine with others. What if I miss a dose? It is important not to miss your dose. Call your doctor or health care professional if you are unable to keep an appointment. What may interact with this medicine?  medicines that treat or prevent blood clots like warfarin, enoxaparin, dalteparin, apixaban, dabigatran, and rivaroxaban This list may not describe all possible interactions. Give your health care provider a list of all the medicines, herbs, non-prescription drugs, or dietary supplements you use. Also tell them if you smoke, drink alcohol, or use illegal drugs. Some items may interact with your medicine. What should I watch for while using this medicine? Your condition will be monitored carefully while you are receiving this medicine. You will need important blood work done while you are taking  this medicine. Do not become pregnant while taking this medicine or for at least 1 year after stopping it. Women of child-bearing potential will need to have a negative pregnancy test before starting this medicine. Women should inform their doctor if they wish to become pregnant or think they might be pregnant. There is a potential for serious side effects to an unborn child. Men should inform their doctors if they wish to father a child. This medicine may lower sperm counts. Talk to your health care professional or pharmacist for more information. Do not breast-feed an infant while taking this medicine or for 1 year after the last dose. What side effects may I notice from receiving this medicine? Side effects that you should report to your doctor or health care professional as soon as possible:  allergic reactions like skin rash, itching or hives, swelling of the face, lips, or tongue  feeling faint or lightheaded, falls  pain, tingling, numbness, or weakness in the legs  signs and symptoms of infection like fever or chills; cough; flu-like symptoms; sore throat  vaginal bleeding Side effects that usually do not require medical attention (report to your doctor or health care professional if they continue or are bothersome):  aches, pains  constipation  diarrhea  headache  hot flashes  nausea, vomiting  pain at site where injected  stomach pain This list may not describe all possible side effects. Call your doctor for medical advice about side effects. You may report side effects to FDA at 1-800-FDA-1088. Where should I keep my medicine? This drug is given in a hospital or clinic and will  not be stored at home. NOTE: This sheet is a summary. It may not cover all possible information. If you have questions about this medicine, talk to your doctor, pharmacist, or health care provider.  2021 Elsevier/Gold Standard (2017-11-25 11:34:41) Denosumab injection What is this  medicine? DENOSUMAB (den oh sue mab) slows bone breakdown. Prolia is used to treat osteoporosis in women after menopause and in men, and in people who are taking corticosteroids for 6 months or more. Delton See is used to treat a high calcium level due to cancer and to prevent bone fractures and other bone problems caused by multiple myeloma or cancer bone metastases. Delton See is also used to treat giant cell tumor of the bone. This medicine may be used for other purposes; ask your health care provider or pharmacist if you have questions. COMMON BRAND NAME(S): Prolia, XGEVA What should I tell my health care provider before I take this medicine? They need to know if you have any of these conditions:  dental disease  having surgery or tooth extraction  infection  kidney disease  low levels of calcium or Vitamin D in the blood  malnutrition  on hemodialysis  skin conditions or sensitivity  thyroid or parathyroid disease  an unusual reaction to denosumab, other medicines, foods, dyes, or preservatives  pregnant or trying to get pregnant  breast-feeding How should I use this medicine? This medicine is for injection under the skin. It is given by a health care professional in a hospital or clinic setting. A special MedGuide will be given to you before each treatment. Be sure to read this information carefully each time. For Prolia, talk to your pediatrician regarding the use of this medicine in children. Special care may be needed. For Delton See, talk to your pediatrician regarding the use of this medicine in children. While this drug may be prescribed for children as young as 13 years for selected conditions, precautions do apply. Overdosage: If you think you have taken too much of this medicine contact a poison control center or emergency room at once. NOTE: This medicine is only for you. Do not share this medicine with others. What if I miss a dose? It is important not to miss your dose. Call  your doctor or health care professional if you are unable to keep an appointment. What may interact with this medicine? Do not take this medicine with any of the following medications:  other medicines containing denosumab This medicine may also interact with the following medications:  medicines that lower your chance of fighting infection  steroid medicines like prednisone or cortisone This list may not describe all possible interactions. Give your health care provider a list of all the medicines, herbs, non-prescription drugs, or dietary supplements you use. Also tell them if you smoke, drink alcohol, or use illegal drugs. Some items may interact with your medicine. What should I watch for while using this medicine? Visit your doctor or health care professional for regular checks on your progress. Your doctor or health care professional may order blood tests and other tests to see how you are doing. Call your doctor or health care professional for advice if you get a fever, chills or sore throat, or other symptoms of a cold or flu. Do not treat yourself. This drug may decrease your body's ability to fight infection. Try to avoid being around people who are sick. You should make sure you get enough calcium and vitamin D while you are taking this medicine, unless your doctor tells  you not to. Discuss the foods you eat and the vitamins you take with your health care professional. See your dentist regularly. Brush and floss your teeth as directed. Before you have any dental work done, tell your dentist you are receiving this medicine. Do not become pregnant while taking this medicine or for 5 months after stopping it. Talk with your doctor or health care professional about your birth control options while taking this medicine. Women should inform their doctor if they wish to become pregnant or think they might be pregnant. There is a potential for serious side effects to an unborn child. Talk to your  health care professional or pharmacist for more information. What side effects may I notice from receiving this medicine? Side effects that you should report to your doctor or health care professional as soon as possible:  allergic reactions like skin rash, itching or hives, swelling of the face, lips, or tongue  bone pain  breathing problems  dizziness  jaw pain, especially after dental work  redness, blistering, peeling of the skin  signs and symptoms of infection like fever or chills; cough; sore throat; pain or trouble passing urine  signs of low calcium like fast heartbeat, muscle cramps or muscle pain; pain, tingling, numbness in the hands or feet; seizures  unusual bleeding or bruising  unusually weak or tired Side effects that usually do not require medical attention (report to your doctor or health care professional if they continue or are bothersome):  constipation  diarrhea  headache  joint pain  loss of appetite  muscle pain  runny nose  tiredness  upset stomach This list may not describe all possible side effects. Call your doctor for medical advice about side effects. You may report side effects to FDA at 1-800-FDA-1088. Where should I keep my medicine? This medicine is only given in a clinic, doctor's office, or other health care setting and will not be stored at home. NOTE: This sheet is a summary. It may not cover all possible information. If you have questions about this medicine, talk to your doctor, pharmacist, or health care provider.  2021 [employer]/Gold Standard (2017-12-24 16:10:44)  

## 2020-10-08 ENCOUNTER — Other Ambulatory Visit: Payer: Self-pay | Admitting: *Deleted

## 2020-10-08 LAB — CANCER ANTIGEN 27.29: CA 27.29: 83.3 U/mL — ABNORMAL HIGH (ref 0.0–38.6)

## 2020-10-09 ENCOUNTER — Telehealth: Payer: Self-pay | Admitting: Oncology

## 2020-10-09 ENCOUNTER — Other Ambulatory Visit: Payer: Self-pay | Admitting: Oncology

## 2020-10-09 NOTE — Telephone Encounter (Signed)
Scheduled appts per 2/7 los. Pt confirmed new arrival time.

## 2020-11-04 ENCOUNTER — Inpatient Hospital Stay (HOSPITAL_BASED_OUTPATIENT_CLINIC_OR_DEPARTMENT_OTHER): Payer: BC Managed Care – PPO | Admitting: Oncology

## 2020-11-04 ENCOUNTER — Other Ambulatory Visit: Payer: Self-pay

## 2020-11-04 ENCOUNTER — Inpatient Hospital Stay: Payer: BC Managed Care – PPO

## 2020-11-04 ENCOUNTER — Inpatient Hospital Stay: Payer: BC Managed Care – PPO | Attending: Oncology

## 2020-11-04 ENCOUNTER — Other Ambulatory Visit: Payer: BC Managed Care – PPO

## 2020-11-04 VITALS — BP 166/77 | HR 103 | Temp 98.0°F | Resp 18 | Ht 64.0 in | Wt 170.4 lb

## 2020-11-04 DIAGNOSIS — C50512 Malignant neoplasm of lower-outer quadrant of left female breast: Secondary | ICD-10-CM

## 2020-11-04 DIAGNOSIS — Z79810 Long term (current) use of selective estrogen receptor modulators (SERMs): Secondary | ICD-10-CM | POA: Diagnosis not present

## 2020-11-04 DIAGNOSIS — Z803 Family history of malignant neoplasm of breast: Secondary | ICD-10-CM | POA: Diagnosis not present

## 2020-11-04 DIAGNOSIS — C7951 Secondary malignant neoplasm of bone: Secondary | ICD-10-CM | POA: Diagnosis not present

## 2020-11-04 DIAGNOSIS — C50912 Malignant neoplasm of unspecified site of left female breast: Secondary | ICD-10-CM

## 2020-11-04 DIAGNOSIS — I700 Atherosclerosis of aorta: Secondary | ICD-10-CM

## 2020-11-04 DIAGNOSIS — Z9013 Acquired absence of bilateral breasts and nipples: Secondary | ICD-10-CM | POA: Diagnosis not present

## 2020-11-04 DIAGNOSIS — Z8249 Family history of ischemic heart disease and other diseases of the circulatory system: Secondary | ICD-10-CM | POA: Insufficient documentation

## 2020-11-04 DIAGNOSIS — Z923 Personal history of irradiation: Secondary | ICD-10-CM | POA: Insufficient documentation

## 2020-11-04 DIAGNOSIS — C50411 Malignant neoplasm of upper-outer quadrant of right female breast: Secondary | ICD-10-CM | POA: Diagnosis not present

## 2020-11-04 DIAGNOSIS — Z170 Estrogen receptor positive status [ER+]: Secondary | ICD-10-CM | POA: Insufficient documentation

## 2020-11-04 DIAGNOSIS — K760 Fatty (change of) liver, not elsewhere classified: Secondary | ICD-10-CM | POA: Diagnosis not present

## 2020-11-04 DIAGNOSIS — Z5111 Encounter for antineoplastic chemotherapy: Secondary | ICD-10-CM | POA: Insufficient documentation

## 2020-11-04 DIAGNOSIS — Z79899 Other long term (current) drug therapy: Secondary | ICD-10-CM | POA: Insufficient documentation

## 2020-11-04 DIAGNOSIS — I10 Essential (primary) hypertension: Secondary | ICD-10-CM | POA: Insufficient documentation

## 2020-11-04 DIAGNOSIS — Z800 Family history of malignant neoplasm of digestive organs: Secondary | ICD-10-CM | POA: Insufficient documentation

## 2020-11-04 LAB — CBC WITH DIFFERENTIAL/PLATELET
Abs Immature Granulocytes: 0.01 10*3/uL (ref 0.00–0.07)
Basophils Absolute: 0 10*3/uL (ref 0.0–0.1)
Basophils Relative: 1 %
Eosinophils Absolute: 0 10*3/uL (ref 0.0–0.5)
Eosinophils Relative: 1 %
HCT: 32.3 % — ABNORMAL LOW (ref 36.0–46.0)
Hemoglobin: 11.7 g/dL — ABNORMAL LOW (ref 12.0–15.0)
Immature Granulocytes: 1 %
Lymphocytes Relative: 50 %
Lymphs Abs: 1.1 10*3/uL (ref 0.7–4.0)
MCH: 35.2 pg — ABNORMAL HIGH (ref 26.0–34.0)
MCHC: 36.2 g/dL — ABNORMAL HIGH (ref 30.0–36.0)
MCV: 97.3 fL (ref 80.0–100.0)
Monocytes Absolute: 0.3 10*3/uL (ref 0.1–1.0)
Monocytes Relative: 11 %
Neutro Abs: 0.8 10*3/uL — ABNORMAL LOW (ref 1.7–7.7)
Neutrophils Relative %: 36 %
Platelets: 212 10*3/uL (ref 150–400)
RBC: 3.32 MIL/uL — ABNORMAL LOW (ref 3.87–5.11)
RDW: 17.1 % — ABNORMAL HIGH (ref 11.5–15.5)
WBC: 2.2 10*3/uL — ABNORMAL LOW (ref 4.0–10.5)
nRBC: 0 % (ref 0.0–0.2)

## 2020-11-04 LAB — COMPREHENSIVE METABOLIC PANEL
ALT: 24 U/L (ref 0–44)
AST: 18 U/L (ref 15–41)
Albumin: 4.2 g/dL (ref 3.5–5.0)
Alkaline Phosphatase: 56 U/L (ref 38–126)
Anion gap: 8 (ref 5–15)
BUN: 10 mg/dL (ref 8–23)
CO2: 30 mmol/L (ref 22–32)
Calcium: 9.3 mg/dL (ref 8.9–10.3)
Chloride: 104 mmol/L (ref 98–111)
Creatinine, Ser: 0.8 mg/dL (ref 0.44–1.00)
GFR, Estimated: >60 mL/min (ref >60)
Glucose, Bld: 106 mg/dL — ABNORMAL HIGH (ref 70–99)
Potassium: 3.4 mmol/L — ABNORMAL LOW (ref 3.5–5.1)
Sodium: 142 mmol/L (ref 135–145)
Total Bilirubin: 0.3 mg/dL (ref 0.3–1.2)
Total Protein: 7 g/dL (ref 6.5–8.1)

## 2020-11-04 MED ORDER — DENOSUMAB 120 MG/1.7ML ~~LOC~~ SOLN
SUBCUTANEOUS | Status: AC
  Filled 2020-11-04: qty 1.7

## 2020-11-04 MED ORDER — FULVESTRANT 250 MG/5ML IM SOLN
500.0000 mg | Freq: Once | INTRAMUSCULAR | Status: AC
  Administered 2020-11-04: 500 mg via INTRAMUSCULAR

## 2020-11-04 MED ORDER — FULVESTRANT 250 MG/5ML IM SOLN
INTRAMUSCULAR | Status: AC
  Filled 2020-11-04: qty 10

## 2020-11-04 MED ORDER — DENOSUMAB 120 MG/1.7ML ~~LOC~~ SOLN
120.0000 mg | Freq: Once | SUBCUTANEOUS | Status: AC
  Administered 2020-11-04: 120 mg via SUBCUTANEOUS

## 2020-11-04 MED FILL — IBRANCE 100 MG TABS: 100 | 28 days supply | Qty: 21 | Fill #0

## 2020-11-04 NOTE — Progress Notes (Signed)
[place]  [phone] [phone]     ID: Natasha Graham DOB: Aug 22, 1961  MR#: [id number]  VQM#:[id number]  Patient Care Team: Leonides Sake, MD as PCP - General (Family Medicine) Rolm Bookbinder, MD as Consulting Physician (General Surgery) Magrinat, Virgie Dad, MD as Consulting Physician (Oncology) Mauro Kaufmann, RN as Registered Nurse Rockwell Germany, RN as Registered Nurse Dillingham, Loel Lofty, DO as Attending Physician (Plastic Surgery) Gery Pray, MD as Consulting Physician (Radiation Oncology) OTHER MD: Sherrie Mustache MD   CHIEF COMPLAINT: new estrogen receptor positive stage IV breast cancer; remote noninvasive right breast cancer (s/p bilateral mastectomies)  CURRENT TREATMENT: Denosumab/Xgeva; fulvestrant; palbociclib/Ibrance   INTERVAL HISTORY: Natasha Graham returns today for follow up and treatment of her newly diagnosed invasive left breast cancer.  She continues on fulvestrant.  She has had no problems from this including no significant discomfort from the actual injections  She also received denosumab, most recently on 10/07/2020.  She did not experience any bony aches or any symptoms suggestive of hypocalcemia  She started on Ibrance on 09/12/2020.  This dropped her Newton last time and again this time so starting with the next cycle, day 1 11/07/2020, she will start the Ibrance at 100 mg daily.   REVIEW OF SYSTEMS: Natasha Graham tells me her son was trying some tricks in a motorcycle and had a significant accident, fracturing 3 bones in his foot but thankfully nothing worse.  The other new says that she has had some spotting.  Recall she has had no menstrual periods for many years.  She says the spotting was minimal and she did not have cramps but it was definitely there.  For exercise she is walking about 30 minutes a day about 3 or 4 days a week.  A detailed review of systems today was otherwise stable   COVID 19 VACCINATION STATUS:  Patient had antibodies to the virus documented November 2020.  She is not planning on vaccinations   LEFT BREAST CANCER HISTORY: From the original intake note:  She had routine screening left mammogram on 06/14/2019 showing a developing asymmetry. She presented for left diagnostic mammogram and left breast ultrasound on 07/07/2019. Physical exam that day showed a palpable, firm, superficial mass in the left breast at 6 o'clock along the site of reduction mammoplasty scar (performed in 2015 with right mastectomy). Scans showed: breast density category B; 2.1 cm superficial mass involving the skin in the left breast at 6 o'clock; no enlarged adenopathy in the left axilla.   Accordingly on 07/12/2019 she proceeded to biopsy of the left breast area in question. The pathology from this procedure ([id number]) showed: invasive lobular carcinoma, grade 1, with perineural invasion present. Prognostic indicators significant for: estrogen receptor, 60% positive with moderate staining intensity and progesterone receptor, 0% negative. Proliferation marker Ki67 at <1%. HER2 equvocal by immunohistochemistry (2+), but negative by fluorescent in situ hybridization with a signals ratio 1.18 and number per cell 1.95.  She opted to proceed with left mastectomy on 09/27/2019 under Dr. Donne Hazel with immediate reconstruction under Dr. Marla Roe. Pathology from the procedure [id number]) revealed: invasive lobular carcinoma, grade 2, 6.2 cm, focally involving anterior margin and involving skin dermis without involvement of epidermis; lobular carcinoma in situ with pagetoid spread; small focus of low grade ductal carcinoma in situ, 0.2 cm.  Two lymph nodes were biopsied. One showed metastatic lobular carcinoma with focus measuring 2.1 cm with evidence of extranodal extension. The other lymph node also showed metastatic lobular carcinoma  with no lymphoid tissue present.  Biopsy of the nipple was also taken at that time and  showed no evidence of invasive carcinoma.  Mammaprint was performed on the final surgical sample. This returned showing low risk.  RIGHT BREAST CANCER HISTORY: Natasha Graham has a history of right breast biopsy in late 2007 showing atypical ductal hyperplasia in the upper outer right breast.. Breast MRI 07/13/2006 showed postbiopsy changes in the upper right breast, and some right breast cysts. There were no solid masses however or areas of abnormal enhancement. There were no enlarged lymph nodes. Incidental finding of liver cysts was made. On 07/30/2006 she underwent needle localization of the calcifications in the outer right breast which also showed ([id number]) atypical ductal hyperplasia, with no malignancy.   On 10/21/2012 routine digital bilateral screening mammography at the breast Center showed breast density to be category C. In the right breast there were calcifications and architectural distortion to go with skin retraction. Additional views obtained 11/17/2012 showed extensive but stable punctate microcalcifications in the upper outer right breast. Many of these were superficial and not amenable to stereotactic biopsy. MRI was suggested but the patient was unable to undergo that test and therefore six-month mammography was obtained 05/15/2013. This showed the calcifications in the outer right breast to have been stable. Because of their extents and because of their superficial location they could not be all sampled all removed with biopsy. MRI was again discussed, but the patient has Harrington rods in place and also felt the cost of MRI was prohibitive.  On 11/13/2013 further six-month follow-up showed a grouping of intermediate microcalcifications in the lateral right breast and these were biopsied 11/20/2013. The result of that procedure ([id number]) showed atypical ductal hyperplasia. Accordingly on 02/23/2014 the patient underwent right lumpectomy, with the pathology ([id number]) showing  ductal carcinoma in situ, grade 2, measuring 0.7 cm. Estrogen receptor was 100% positive. Progesterone receptor was 96% positive. Both showed strong staining intensity. Margins were clear but the closest margin anteriorly was at 1 mm.  On 03/07/2014 the patient underwent postoperative mammography which showed post lumpectomy changes but also calcifications extending from the lumpectomy site anteriorly towards the nipple. On 03/12/2014 the patient underwent bilateral breast MRI. Breast composition here was described as category B. In the right breast there was a 4.8 cm fluidlike excisional cavity in the lower outer right breast. Adjacent to this was a focus of non-masslike enhancement measuring 1.9 cm. This is an area associated with the scar from the excision in 2007. There was also non-masslike enhancement inferior to the biopsy cavity measuring 3.8 cm. There were no abnormal lymph nodes and no masses or abnormal enhancement in the left breast. Specifically the left breast lower outer quadrant was unchanged from 2007 MRI.  Overall it was felt the linear non-masslike enhancement extending almost 4 cm inferior to the excisional cavity was sufficiently suspicious to warrant biopsy, and this was performed 03/29/2014. It again showed ductal carcinoma in situ, grade 1, estrogen receptor and progesterone receptor both 100% positive with strong staining intensity.  At this point the patient met with surgery and opted for definitive right mastectomy with immediate reconstruction. On 05/16/2014 she underwent simple mastectomy with sentinel lymph node sampling. The pathology ([id number]) showed atypical ductal hyperplasia but no evidence of neoplasia. All 5 sentinel lymph nodes were clear. The patient had immediate right breast reconstruction with tissue expander and dermal matrix placement.  She is scheduled for definitive implant placement 08/08/2014.  Her subsequent history is as  detailed below   PAST MEDICAL  HISTORY: Past Medical History:  Diagnosis Date  . Cancer [facility])    breast cancer  . Family history of brain cancer   . Family history of breast cancer   . Family history of colon cancer   . Family history of stomach cancer   . Hypertension   . PONV (postoperative nausea and vomiting)   . Scoliosis   . Wears glasses     PAST SURGICAL HISTORY: Past Surgical History:  Procedure Laterality Date  . AUGMENTATION MAMMAPLASTY Right    2015 post mastectomy  . AXILLARY LYMPH NODE DISSECTION Left 11/13/2019   Procedure: LEFT AXILLARY LYMPH NODE DISSECTION;  Surgeon: Rolm Bookbinder, MD;  Location: [place];  Service: General;  Laterality: Left;  . BREAST RECONSTRUCTION WITH PLACEMENT OF TISSUE EXPANDER AND FLEX HD (ACELLULAR HYDRATED DERMIS) Right 05/16/2014   Procedure: IMMEDIATE RIGHT BREAST RECONSTRUCTION WITH PLACEMENT OF TISSUE EXPANDER AND FLEX HD (ACELLULAR HYDRATED DERMIS);  Surgeon: Theodoro Kos, DO;  Location: [place];  Service: Plastics;  Laterality: Right;  . BREAST RECONSTRUCTION WITH PLACEMENT OF TISSUE EXPANDER AND FLEX HD (ACELLULAR HYDRATED DERMIS) Left 09/27/2019   Procedure: LEFT BREAST RECONSTRUCTION WITH PLACEMENT OF TISSUE EXPANDER AND FLEX HD (ACELLULAR HYDRATED DERMIS);  Surgeon: Wallace Going, DO;  Location: [place];  Service: Plastics;  Laterality: Left;  . BREAST SURGERY     right breast excisional biopsy  . DILATION AND CURETTAGE OF UTERUS    . HYSTEROSCOPY WITH D & C N/A 09/20/2015   Procedure: DILATATION AND CURETTAGE /HYSTEROSCOPY;  Surgeon: Brien Few, MD;  Location: [facility];  Service: Gynecology;  Laterality: N/A;  . LIPOSUCTION Bilateral 08/08/2014   Procedure: LIPO SUCTION ;  Surgeon: Theodoro Kos, DO;  Location: [place];  Service: Plastics;  Laterality: Bilateral;  . MASTECTOMY Right 05/16/2014   placement of acellular dermal matrix & tissue expanders   . MASTOPEXY Left 08/08/2014   Procedure:   MASTOPEXY FOR SYMMETRY;  Surgeon: Theodoro Kos, DO;  Location: [place];  Service: Plastics;  Laterality: Left;  . NIPPLE SPARING MASTECTOMY WITH SENTINEL LYMPH NODE BIOPSY Left 09/27/2019   Procedure: LEFT NIPPLE SPARING MASTECTOMY WITH LEFT AXILLARY SENTINEL LYMPH NODE BIOPSY;  Surgeon: Rolm Bookbinder, MD;  Location: [facility];  Service: General;  Laterality: Left;  . REDUCTION MAMMAPLASTY Left    2015  . REMOVAL OF TISSUE EXPANDER AND PLACEMENT OF IMPLANT Right 08/08/2014   Procedure: REMOVAL OF RIGHT  TISSUE EXPANDERS WITH PLACEMENT OF RIGHT BREAST IMPLANTS WITH LIPO SUCTION ;  Surgeon: Theodoro Kos, DO;  Location: [place];  Service: Plastics;  Laterality: Right;  . SCAR REVISION Left 11/13/2019   Procedure: EXCISION OF LEFT MASTECTOMY SKIN;  Surgeon: Rolm Bookbinder, MD;  Location: [place];  Service: General;  Laterality: Left;  . scoliosis  1972   harrington rods-age 83  . TONSILLECTOMY      FAMILY HISTORY Family History  Problem Relation Age of Onset  . Heart disease Mother   . Cancer Father        liver  . Colon cancer Father 55  . Head & neck cancer Father        oral cancer - dx in 74s-50s  . Breast cancer Paternal Aunt        dx <50  . Brain cancer Brother        possible meningioma  . Cancer Maternal Aunt  liver/lung cancer  . Stomach cancer Paternal Uncle   . Heart disease Maternal Grandmother   . Lymphoma Maternal Grandfather        NHL  . Heart attack Paternal Grandfather   . Cancer Paternal Uncle        NOS   there is significant cancer history on the paternal side, her father dying at the age of 66 with what the patient tells me was metastatic head and neck cancer (but also involving the colon and liver). One of the patient's father's brothers had stomach cancer, and another had a brain tumor, and 2 of the patient's father's sisters had breast cancer, 1 diagnosed at age 12, the other  at age 1.  On the mother's side there is a history of non-Hodgkin's lymphoma at age 82. There is no history of ovarian cancer in the family   GYNECOLOGIC HISTORY:  Patient's last menstrual period was 10/16/2013.  menarche age 46, first live birth age 18. The patient is GX P3. She stopped having periods in October 2014. She did not take hormone replacement. She did take birth control pills for approximately 1 year remotely, with no complications.   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 She works as an Web designer. Son Truman Hayward drives a truck for the family business. Daughter Thayer Headings is a Forensic psychologist and currently works as an IT trainer in [place].  Daughter Roselyn Reef is studying history and English at [employer]. The patient has 2 granddaughters aged 40 and 1 as of May 2021. She attends [employer]    ADVANCED DIRECTIVES: In the absence of any documents to the contrary the patient's husband is her healthcare power of attorney   HEALTH MAINTENANCE: Social History   Tobacco Use  . Smoking status: Never Smoker  . Smokeless tobacco: Never Used  Vaping Use  . Vaping Use: Never used  Substance Use Topics  . Alcohol use: No  . Drug use: No     Colonoscopy:  2013  PAP: March 2015  Bone density:  Lipid panel:  No Known Allergies  Current Outpatient Medications  Medication Sig Dispense Refill  . acetaminophen (TYLENOL) 500 MG tablet Take by mouth.    . Ascorbic Acid (VITAMIN C) 100 MG tablet Take 100 mg by mouth daily.    . Flaxseed Oil (LINSEED OIL) OIL by Misc.(Non-Drug; Combo Route) route.    . Ginger, Zingiber officinalis, (GINGER PO) Take by mouth.    Marland Kitchen ibuprofen (ADVIL) 200 MG tablet Take by mouth.    Marland Kitchen lisinopril-hydrochlorothiazide (PRINZIDE,ZESTORETIC) 20-25 MG per tablet Take 1 tablet by mouth every evening.     . Magnesium 250 MG TABS Take by mouth.    . Multiple  Vitamin (MULTIVITAMIN) capsule Take by mouth.    . palbociclib (IBRANCE) 100 MG tablet Take 1 tablet (100 mg total) by mouth daily. Take for 21 days on, 7 days off, repeat every 28 days. Start with March 2022 cycle 21 tablet 6  . Turmeric 500 MG CAPS Take by mouth.     No current facility-administered medications for this visit.   Facility-Administered Medications Ordered in Other Visits  Medication Dose Route Frequency Provider Last Rate Last Admin  . denosumab (XGEVA) injection 120 mg  120 mg Subcutaneous Once Magrinat, Virgie Dad, MD      . fulvestrant (FASLODEX) injection 500 mg  500 mg Intramuscular Once Magrinat, Virgie Dad, MD  OBJECTIVE: White Graham who appears well Vitals:   11/04/20 1437  BP: (!) 166/77  Pulse: (!) 103  Resp: 18  Temp: 98 F (36.7 C)  SpO2: 100%     Body mass index is 29.25 kg/m.    ECOG FS:1 - Symptomatic but completely ambulatory  Sclerae unicteric, EOMs intact Wearing a mask No cervical or supraclavicular adenopathy Lungs no rales or rhonchi Heart regular rate and rhythm Abd soft, nontender, positive bowel sounds MSK no focal spinal tenderness, no upper extremity lymphedema Neuro: nonfocal, well oriented, appropriate affect Breasts: Status post bilateral mastectomies.  The left-sided reconstruction is very tight but there is no evidence of local recurrence.  Both axillae are benign.   LAB RESULTS:  CMP     Component Value Date/Time   NA 142 11/04/2020 1354   NA 143 07/30/2014 1603   K 3.4 (L) 11/04/2020 1354   K 3.5 07/30/2014 1603   CL 104 11/04/2020 1354   CO2 30 11/04/2020 1354   CO2 30 (H) 07/30/2014 1603   GLUCOSE 106 (H) 11/04/2020 1354   GLUCOSE 93 07/30/2014 1603   BUN 10 11/04/2020 1354   BUN 10.5 07/30/2014 1603   CREATININE 0.80 11/04/2020 1354   CREATININE 0.9 07/30/2014 1603   CALCIUM 9.3 11/04/2020 1354   CALCIUM 9.5 07/30/2014 1603   PROT 7.0 11/04/2020 1354   PROT 7.0 07/30/2014 1603   ALBUMIN 4.2 11/04/2020 1354    ALBUMIN 4.0 07/30/2014 1603   AST 18 11/04/2020 1354   AST 23 07/30/2014 1603   ALT 24 11/04/2020 1354   ALT 23 07/30/2014 1603   ALKPHOS 56 11/04/2020 1354   ALKPHOS 78 07/30/2014 1603   BILITOT 0.3 11/04/2020 1354   BILITOT 0.29 07/30/2014 1603   GFRNONAA >60 11/04/2020 1354   GFRAA >60 04/29/2020 0829    INo results found for: SPEP, UPEP  Lab Results  Component Value Date   WBC 2.2 (L) 11/04/2020   NEUTROABS 0.8 (L) 11/04/2020   HGB 11.7 (L) 11/04/2020   HCT 32.3 (L) 11/04/2020   MCV 97.3 11/04/2020   PLT 212 11/04/2020      Chemistry      Component Value Date/Time   NA 142 11/04/2020 1354   NA 143 07/30/2014 1603   K 3.4 (L) 11/04/2020 1354   K 3.5 07/30/2014 1603   CL 104 11/04/2020 1354   CO2 30 11/04/2020 1354   CO2 30 (H) 07/30/2014 1603   BUN 10 11/04/2020 1354   BUN 10.5 07/30/2014 1603   CREATININE 0.80 11/04/2020 1354   CREATININE 0.9 07/30/2014 1603      Component Value Date/Time   CALCIUM 9.3 11/04/2020 1354   CALCIUM 9.5 07/30/2014 1603   ALKPHOS 56 11/04/2020 1354   ALKPHOS 78 07/30/2014 1603   AST 18 11/04/2020 1354   AST 23 07/30/2014 1603   ALT 24 11/04/2020 1354   ALT 23 07/30/2014 1603   BILITOT 0.3 11/04/2020 1354   BILITOT 0.29 07/30/2014 1603      No results found for: LABCA2  No components found for: LABCA125  No results for input(s): INR in the last 168 hours.  Urinalysis No results found for: COLORURINE, APPEARANCEUR, LABSPEC, PHURINE, GLUCOSEU, HGBUR, BILIRUBINUR, KETONESUR, PROTEINUR, UROBILINOGEN, NITRITE, LEUKOCYTESUR   STUDIES: No results found.     ASSESSMENT: 60 y.o. Natasha Graham, Natasha Graham  RIGHT BREAST CANCER (1) status post right breast upper-outer quadrant lumpectomy 07/30/2006 showing atypical ductal hyperplasia.  (2) right breast upper outer quadrant biopsy 11/20/2013 showed  atypical ductal hyperplasia   (3) right lumpectomy 02/26/2014 showed ductal carcinoma in situ measuring 0.7 cm, estrogen receptor  100% positive, progesterone receptor 96% positive, with 1 mm margins  (4) right breast lower inner quadrant biopsy 03/29/2014 showed ductal carcinoma in situ, 100% estrogen receptor positive, 100% progesterone receptor positive,  (5) status post right mastectomy with sentinel lymph node sampling showing only atypical ductal hyperplasia    (a) five sentinel lymph nodes removed, all clear  (b) status post right saline implant reconstruction  (6) the patient opted against prophylactic antiestrogens   LEFT BREAST CANCER (7) status post left breast lower outer quadrant biopsy 07/12/2019 for a clinical T1N0, stage IA invasive lobular carcinoma, grade 1, with evidence of perineural invasion, estrogen receptor moderately positive, progesterone receptor negative, HER-2 not amplified, with an MIB-1 of less than 1%  (8) status post left nipple sparing mastectomy 09/27/2019 for a pT3 pN1, stage IIIA invasive lobular carcinoma, grade 2, with a negative nipple biopsy and a focally positive anterior margin  (a) 1 sentinel lymph node had a 2.1 cm tumor deposit; a second positive "lymph node" had no lymph node tissue  (b) immediate expander placement  (c) status post left axillary lymph node dissection and margin excision 11/13/2019, no residual disease   (i) 7 of 7 left axillary lymph nodes negative for carcinoma (total 8 nodes removed)  (d) repeat prognostic panel again estrogen receptor 80% positive with moderate staining, progesterone receptor negative    (9) MammaPrint "low risk" predicts no significant benefit from chemotherapy.   (10) adjuvant radiation 12/26/2019 - 02/06/2020 Site Technique Total Dose (Gy) Dose per Fx (Gy) Completed Fx Beam Energies  Chest Wall, Left: CW_Lt_Bst Electron 10/10 2 5/5 6E  Sclav-LT: SCV_Lt Complex 50/50 2 25/25 6X, 15X  Sclav-LT: SCV_Lt_Bst Complex 8/8 2 4/4 6X, 15X  Chest Wall, Left: CW_Lt 3D 50/50 2 25/25 6X, 10X, 15X   (11) staging studies:   (a) chest, abdomen and  pelvic CT with contrast 10/31/2019 shows mild hepatic steatosis, small uterine fibroids, 0.3 cm left upper lobe nodule, but no evidence of metastatic disease.  (b) F18 estradiol PET scan 11/21/2019 shows a chain of very small lymph nodes from the subpectoralis nodal station to the left supraclavicular nodal station positive for ER accumulation with SUVs in the 4 range (versus mediastinal blood pool 1.4). no evidence of lung, bone, or liver involvement.   (12) tamoxifen started 02/29/2020, discontinued DEC 2021 with recurrence  (13) genetics testing 06/27/2020 through the CancerNext-Expanded gene panel offered by [facility] found no deleterious mutations in AIP, ALK, APC*, ATM*, AXIN2, BAP1, BARD1, BLM, BMPR1A, BRCA1*, BRCA2*, BRIP1*, CDC73, CDH1*, CDK4, CDKN1B, CDKN2A, CHEK2*, CTNNA1, DICER1, FANCC, FH, FLCN, GALNT12, KIF1B, LZTR1, MAX, MEN1, MET, MLH1*, MSH2*, MSH3, MSH6*, MUTYH*, NBN, NF1*, NF2, NTHL1, PALB2*, PHOX2B, PMS2*, POT1, PRKAR1A, PTCH1, PTEN*, RAD51C*, RAD51D*, RB1, RECQL, RET, SDHA, SDHAF2, SDHB, SDHC, SDHD, SMAD4, SMARCA4, SMARCB1, SMARCE1, STK11, SUFU, TMEM127, TP53*, TSC1, TSC2, VHL and XRCC2 (sequencing and deletion/duplication); EGFR, EGLN1, HOXB13, KIT, MITF, PDGFRA, POLD1, and POLE (sequencing only); EPCAM and GREM1 (deletion/duplication only). DNA and RNA analyses performed for * genes. The report date is 06/27/2020.  (14) reconstruction in process  METASTATIC DISEASE: December 2021 (15) chest and neck CT with contrast 08/02/2020 shows diffuse bony sclerosis consistent with metastatic disease, no evidence of visceral disease  (a) CA 27-29 on 08/05/2020 showed 98.5  (b) bone marrow biopsy 08/06/2020 showed metastatic carcinoma, estrogen receptor positive (75% moderate/strong), progesterone receptor and HER-2 negative.  (16) denosumab/Xgeva  started 08/15/2020  (17) fulvestrant started 08/15/2020  (a) palbociclib started 09/12/2020 at 125 mg per day, 21/7  (b) palbociclib dose  to be decreased to 100 mg daily starting with March 2022 cycle   PLAN: Natasha Graham looks and feels well and I do not think anyone who Natasha Graham would imagine she could possibly have metastatic breast cancer but of course she does.  Her disease is very difficult to demonstrate.  She has had CT scans now bone scan, and earlier [place] Aryanna scan and all of them give [place] some information but we do not have good measurable disease anywhere  Accordingly we are leaning on the CA 27-29.  This is dropping.  We have a repeat today pending and of course there will be another 1 in 4 weeks.  I am going to see her in 8 weeks on her dosing day and we will continue to follow this trend.  We discussed the fact that postmenopausal bleeding can be a sign of endometrial cancer.  The type of breast cancer she has, lobular, can infiltrate any organ but of course she was also on tamoxifen which has been associated with an increased risk of endometrial cancer.  We are referring her to her gynecologist Dr. Ronita Hipps for further evaluation  Otherwise she will see me again in 8 weeks.  She knows to call for any other issue that may develop before the next visit  Total encounter time 35 minutes.  Virgie Dad. Magrinat, MD 11/04/2020 3:10 PM  Oncology and Hematology [facility] [place], [place] [address] Tel. [phone]  Joylene Igo [phone]   I, Wilburn Mylar, am acting as scribe for Dr. Sarajane Jews C. Magrinat.  I, Lurline Del MD, have reviewed the above documentation for accuracy and completeness, and I agree with the above.   *Total Encounter Time as defined by the Centers for Medicare and Medicaid Services includes, in addition to the face-to-face time of a patient visit (documented in the note above) non-face-to-face time: obtaining and reviewing outside history, ordering and reviewing medications, tests or procedures, care coordination (communications with other health care professionals or  caregivers) and documentation in the medical record.

## 2020-11-04 NOTE — Patient Instructions (Signed)
Fulvestrant injection What is this medicine? FULVESTRANT (ful VES trant) blocks the effects of estrogen. It is used to treat breast cancer. This medicine may be used for other purposes; ask your health care provider or pharmacist if you have questions. COMMON BRAND NAME(S): FASLODEX What should I tell my health care provider before I take this medicine? They need to know if you have any of these conditions:  bleeding disorders  liver disease  low blood counts, like low white cell, platelet, or red cell counts  an unusual or allergic reaction to fulvestrant, other medicines, foods, dyes, or preservatives  pregnant or trying to get pregnant  breast-feeding How should I use this medicine? This medicine is for injection into a muscle. It is usually given by a health care professional in a hospital or clinic setting. Talk to your pediatrician regarding the use of this medicine in children. Special care may be needed. Overdosage: If you think you have taken too much of this medicine contact a poison control center or emergency room at once. NOTE: This medicine is only for you. Do not share this medicine with others. What if I miss a dose? It is important not to miss your dose. Call your doctor or health care professional if you are unable to keep an appointment. What may interact with this medicine?  medicines that treat or prevent blood clots like warfarin, enoxaparin, dalteparin, apixaban, dabigatran, and rivaroxaban This list may not describe all possible interactions. Give your health care provider a list of all the medicines, herbs, non-prescription drugs, or dietary supplements you use. Also tell them if you smoke, drink alcohol, or use illegal drugs. Some items may interact with your medicine. What should I watch for while using this medicine? Your condition will be monitored carefully while you are receiving this medicine. You will need important blood work done while you are taking  this medicine. Do not become pregnant while taking this medicine or for at least 1 year after stopping it. Women of child-bearing potential will need to have a negative pregnancy test before starting this medicine. Women should inform their doctor if they wish to become pregnant or think they might be pregnant. There is a potential for serious side effects to an unborn child. Men should inform their doctors if they wish to father a child. This medicine may lower sperm counts. Talk to your health care professional or pharmacist for more information. Do not breast-feed an infant while taking this medicine or for 1 year after the last dose. What side effects may I notice from receiving this medicine? Side effects that you should report to your doctor or health care professional as soon as possible:  allergic reactions like skin rash, itching or hives, swelling of the face, lips, or tongue  feeling faint or lightheaded, falls  pain, tingling, numbness, or weakness in the legs  signs and symptoms of infection like fever or chills; cough; flu-like symptoms; sore throat  vaginal bleeding Side effects that usually do not require medical attention (report to your doctor or health care professional if they continue or are bothersome):  aches, pains  constipation  diarrhea  headache  hot flashes  nausea, vomiting  pain at site where injected  stomach pain This list may not describe all possible side effects. Call your doctor for medical advice about side effects. You may report side effects to FDA at 1-800-FDA-1088. Where should I keep my medicine? This drug is given in a hospital or clinic and will  not be stored at home. NOTE: This sheet is a summary. It may not cover all possible information. If you have questions about this medicine, talk to your doctor, pharmacist, or health care provider.  2021 Elsevier/Gold Standard (2017-11-25 11:34:41) Denosumab injection What is this  medicine? DENOSUMAB (den oh sue mab) slows bone breakdown. Prolia is used to treat osteoporosis in women after menopause and in men, and in people who are taking corticosteroids for 6 months or more. Xgeva is used to treat a high calcium level due to cancer and to prevent bone fractures and other bone problems caused by multiple myeloma or cancer bone metastases. Xgeva is also used to treat giant cell tumor of the bone. This medicine may be used for other purposes; ask your health care provider or pharmacist if you have questions. COMMON BRAND NAME(S): Prolia, XGEVA What should I tell my health care provider before I take this medicine? They need to know if you have any of these conditions:  dental disease  having surgery or tooth extraction  infection  kidney disease  low levels of calcium or Vitamin D in the blood  malnutrition  on hemodialysis  skin conditions or sensitivity  thyroid or parathyroid disease  an unusual reaction to denosumab, other medicines, foods, dyes, or preservatives  pregnant or trying to get pregnant  breast-feeding How should I use this medicine? This medicine is for injection under the skin. It is given by a health care professional in a hospital or clinic setting. A special MedGuide will be given to you before each treatment. Be sure to read this information carefully each time. For Prolia, talk to your pediatrician regarding the use of this medicine in children. Special care may be needed. For Xgeva, talk to your pediatrician regarding the use of this medicine in children. While this drug may be prescribed for children as young as 13 years for selected conditions, precautions do apply. Overdosage: If you think you have taken too much of this medicine contact a poison control center or emergency room at once. NOTE: This medicine is only for you. Do not share this medicine with others. What if I miss a dose? It is important not to miss your dose. Call  your doctor or health care professional if you are unable to keep an appointment. What may interact with this medicine? Do not take this medicine with any of the following medications:  other medicines containing denosumab This medicine may also interact with the following medications:  medicines that lower your chance of fighting infection  steroid medicines like prednisone or cortisone This list may not describe all possible interactions. Give your health care provider a list of all the medicines, herbs, non-prescription drugs, or dietary supplements you use. Also tell them if you smoke, drink alcohol, or use illegal drugs. Some items may interact with your medicine. What should I watch for while using this medicine? Visit your doctor or health care professional for regular checks on your progress. Your doctor or health care professional may order blood tests and other tests to see how you are doing. Call your doctor or health care professional for advice if you get a fever, chills or sore throat, or other symptoms of a cold or flu. Do not treat yourself. This drug may decrease your body's ability to fight infection. Try to avoid being around people who are sick. You should make sure you get enough calcium and vitamin D while you are taking this medicine, unless your doctor tells   you not to. Discuss the foods you eat and the vitamins you take with your health care professional. See your dentist regularly. Brush and floss your teeth as directed. Before you have any dental work done, tell your dentist you are receiving this medicine. Do not become pregnant while taking this medicine or for 5 months after stopping it. Talk with your doctor or health care professional about your birth control options while taking this medicine. Women should inform their doctor if they wish to become pregnant or think they might be pregnant. There is a potential for serious side effects to an unborn child. Talk to your  health care professional or pharmacist for more information. What side effects may I notice from receiving this medicine? Side effects that you should report to your doctor or health care professional as soon as possible:  allergic reactions like skin rash, itching or hives, swelling of the face, lips, or tongue  bone pain  breathing problems  dizziness  jaw pain, especially after dental work  redness, blistering, peeling of the skin  signs and symptoms of infection like fever or chills; cough; sore throat; pain or trouble passing urine  signs of low calcium like fast heartbeat, muscle cramps or muscle pain; pain, tingling, numbness in the hands or feet; seizures  unusual bleeding or bruising  unusually weak or tired Side effects that usually do not require medical attention (report to your doctor or health care professional if they continue or are bothersome):  constipation  diarrhea  headache  joint pain  loss of appetite  muscle pain  runny nose  tiredness  upset stomach This list may not describe all possible side effects. Call your doctor for medical advice about side effects. You may report side effects to FDA at 1-800-FDA-1088. Where should I keep my medicine? This medicine is only given in a clinic, doctor's office, or other health care setting and will not be stored at home. NOTE: This sheet is a summary. It may not cover all possible information. If you have questions about this medicine, talk to your doctor, pharmacist, or health care provider.  2021 Elsevier/Gold Standard (2017-12-24 16:10:44)  

## 2020-11-05 ENCOUNTER — Encounter: Payer: Self-pay | Admitting: Pharmacist

## 2020-11-05 LAB — CANCER ANTIGEN 27.29: CA 27.29: 80.8 U/mL — ABNORMAL HIGH (ref 0.0–38.6)

## 2020-11-05 NOTE — Progress Notes (Signed)
Oncology Clinical Pharmacist Note   Natasha Graham is a 60 y.o. female with a diagnosis of breast cancer currently on palbociclib under the care of Dr. Lurline Del.  Patient also receives every 28 day fulvestrant (Faslodex) and denosumab Delton See).  Dr. Jana Hakim saw patient on 11/05/20.  Clinical review was done and noted ANC of 800.  This was brought to Dr. Virgie Dad attention.  We discussed manufacturer guideline hold parameters for ANC < 1000. Dr. Jana Hakim has reduced the dose of palbociclib from 125 mg to 100 mg daily for 21 days followed by a 7 day rest period.  This was communicated to the patient who expressed agreement.  She is due to start this new dose with her next cycle on Thursday 11/07/20 per Dr. Jana Hakim.    Clinical pharmacy will continue to support Natasha Graham and Dr. Jana Hakim as needed.  Natasha Graham, [place],  11/05/2020  8:39 AM

## 2020-11-19 DIAGNOSIS — N8502 Endometrial intraepithelial neoplasia [EIN]: Secondary | ICD-10-CM | POA: Diagnosis not present

## 2020-11-19 DIAGNOSIS — N840 Polyp of corpus uteri: Secondary | ICD-10-CM | POA: Diagnosis not present

## 2020-11-19 DIAGNOSIS — N950 Postmenopausal bleeding: Secondary | ICD-10-CM | POA: Diagnosis not present

## 2020-11-22 ENCOUNTER — Other Ambulatory Visit (HOSPITAL_COMMUNITY): Payer: Self-pay

## 2020-11-30 ENCOUNTER — Other Ambulatory Visit (HOSPITAL_COMMUNITY): Payer: Self-pay

## 2020-12-02 ENCOUNTER — Inpatient Hospital Stay: Payer: BC Managed Care – PPO | Attending: Oncology

## 2020-12-02 ENCOUNTER — Other Ambulatory Visit: Payer: BC Managed Care – PPO

## 2020-12-02 ENCOUNTER — Inpatient Hospital Stay: Payer: BC Managed Care – PPO

## 2020-12-02 ENCOUNTER — Other Ambulatory Visit: Payer: Self-pay

## 2020-12-02 ENCOUNTER — Other Ambulatory Visit (HOSPITAL_COMMUNITY): Payer: Self-pay

## 2020-12-02 ENCOUNTER — Ambulatory Visit: Payer: BC Managed Care – PPO

## 2020-12-02 VITALS — BP 176/93 | HR 97 | Temp 98.1°F | Resp 18

## 2020-12-02 DIAGNOSIS — Z5111 Encounter for antineoplastic chemotherapy: Secondary | ICD-10-CM | POA: Diagnosis not present

## 2020-12-02 DIAGNOSIS — C50512 Malignant neoplasm of lower-outer quadrant of left female breast: Secondary | ICD-10-CM

## 2020-12-02 DIAGNOSIS — Z170 Estrogen receptor positive status [ER+]: Secondary | ICD-10-CM | POA: Diagnosis not present

## 2020-12-02 DIAGNOSIS — I700 Atherosclerosis of aorta: Secondary | ICD-10-CM

## 2020-12-02 DIAGNOSIS — K760 Fatty (change of) liver, not elsewhere classified: Secondary | ICD-10-CM

## 2020-12-02 DIAGNOSIS — C7951 Secondary malignant neoplasm of bone: Secondary | ICD-10-CM | POA: Diagnosis not present

## 2020-12-02 DIAGNOSIS — C50812 Malignant neoplasm of overlapping sites of left female breast: Secondary | ICD-10-CM | POA: Insufficient documentation

## 2020-12-02 DIAGNOSIS — C7989 Secondary malignant neoplasm of other specified sites: Secondary | ICD-10-CM | POA: Insufficient documentation

## 2020-12-02 DIAGNOSIS — C50912 Malignant neoplasm of unspecified site of left female breast: Secondary | ICD-10-CM

## 2020-12-02 LAB — CBC WITH DIFFERENTIAL/PLATELET
Abs Immature Granulocytes: 0 10*3/uL (ref 0.00–0.07)
Basophils Absolute: 0 10*3/uL (ref 0.0–0.1)
Basophils Relative: 2 %
Eosinophils Absolute: 0 10*3/uL (ref 0.0–0.5)
Eosinophils Relative: 2 %
HCT: 33.3 % — ABNORMAL LOW (ref 36.0–46.0)
Hemoglobin: 12.2 g/dL (ref 12.0–15.0)
Immature Granulocytes: 0 %
Lymphocytes Relative: 42 %
Lymphs Abs: 1 10*3/uL (ref 0.7–4.0)
MCH: 36.7 pg — ABNORMAL HIGH (ref 26.0–34.0)
MCHC: 36.6 g/dL — ABNORMAL HIGH (ref 30.0–36.0)
MCV: 100.3 fL — ABNORMAL HIGH (ref 80.0–100.0)
Monocytes Absolute: 0.2 10*3/uL (ref 0.1–1.0)
Monocytes Relative: 10 %
Neutro Abs: 1 10*3/uL — ABNORMAL LOW (ref 1.7–7.7)
Neutrophils Relative %: 44 %
Platelets: 230 10*3/uL (ref 150–400)
RBC: 3.32 MIL/uL — ABNORMAL LOW (ref 3.87–5.11)
RDW: 15.7 % — ABNORMAL HIGH (ref 11.5–15.5)
WBC: 2.3 10*3/uL — ABNORMAL LOW (ref 4.0–10.5)
nRBC: 0 % (ref 0.0–0.2)

## 2020-12-02 LAB — COMPREHENSIVE METABOLIC PANEL
ALT: 23 U/L (ref 0–44)
AST: 20 U/L (ref 15–41)
Albumin: 4.2 g/dL (ref 3.5–5.0)
Alkaline Phosphatase: 51 U/L (ref 38–126)
Anion gap: 11 (ref 5–15)
BUN: 10 mg/dL (ref 8–23)
CO2: 28 mmol/L (ref 22–32)
Calcium: 9.2 mg/dL (ref 8.9–10.3)
Chloride: 103 mmol/L (ref 98–111)
Creatinine, Ser: 0.79 mg/dL (ref 0.44–1.00)
GFR, Estimated: >60 mL/min (ref >60)
Glucose, Bld: 112 mg/dL — ABNORMAL HIGH (ref 70–99)
Potassium: 3.4 mmol/L — ABNORMAL LOW (ref 3.5–5.1)
Sodium: 142 mmol/L (ref 135–145)
Total Bilirubin: 0.5 mg/dL (ref 0.3–1.2)
Total Protein: 7 g/dL (ref 6.5–8.1)

## 2020-12-02 MED ORDER — FULVESTRANT 250 MG/5ML IM SOLN
INTRAMUSCULAR | Status: AC
  Filled 2020-12-02: qty 10

## 2020-12-02 MED ORDER — DENOSUMAB 120 MG/1.7ML ~~LOC~~ SOLN
SUBCUTANEOUS | Status: AC
  Filled 2020-12-02: qty 1.7

## 2020-12-02 MED ORDER — FULVESTRANT 250 MG/5ML IM SOLN
500.0000 mg | Freq: Once | INTRAMUSCULAR | Status: AC
  Administered 2020-12-02: 500 mg via INTRAMUSCULAR

## 2020-12-02 MED ORDER — DENOSUMAB 120 MG/1.7ML ~~LOC~~ SOLN
120.0000 mg | Freq: Once | SUBCUTANEOUS | Status: AC
  Administered 2020-12-02: 120 mg via SUBCUTANEOUS

## 2020-12-02 MED FILL — Palbociclib Tab 100 MG: ORAL | 28 days supply | Qty: 21 | Fill #0 | Status: AC

## 2020-12-02 NOTE — Patient Instructions (Signed)
Fulvestrant injection What is this medicine? FULVESTRANT (ful VES trant) blocks the effects of estrogen. It is used to treat breast cancer. This medicine may be used for other purposes; ask your health care provider or pharmacist if you have questions. COMMON BRAND NAME(S): FASLODEX What should I tell my health care provider before I take this medicine? They need to know if you have any of these conditions:  bleeding disorders  liver disease  low blood counts, like low white cell, platelet, or red cell counts  an unusual or allergic reaction to fulvestrant, other medicines, foods, dyes, or preservatives  pregnant or trying to get pregnant  breast-feeding How should I use this medicine? This medicine is for injection into a muscle. It is usually given by a health care professional in a hospital or clinic setting. Talk to your pediatrician regarding the use of this medicine in children. Special care may be needed. Overdosage: If you think you have taken too much of this medicine contact a poison control center or emergency room at once. NOTE: This medicine is only for you. Do not share this medicine with others. What if I miss a dose? It is important not to miss your dose. Call your doctor or health care professional if you are unable to keep an appointment. What may interact with this medicine?  medicines that treat or prevent blood clots like warfarin, enoxaparin, dalteparin, apixaban, dabigatran, and rivaroxaban This list may not describe all possible interactions. Give your health care provider a list of all the medicines, herbs, non-prescription drugs, or dietary supplements you use. Also tell them if you smoke, drink alcohol, or use illegal drugs. Some items may interact with your medicine. What should I watch for while using this medicine? Your condition will be monitored carefully while you are receiving this medicine. You will need important blood work done while you are taking  this medicine. Do not become pregnant while taking this medicine or for at least 1 year after stopping it. Women of child-bearing potential will need to have a negative pregnancy test before starting this medicine. Women should inform their doctor if they wish to become pregnant or think they might be pregnant. There is a potential for serious side effects to an unborn child. Men should inform their doctors if they wish to father a child. This medicine may lower sperm counts. Talk to your health care professional or pharmacist for more information. Do not breast-feed an infant while taking this medicine or for 1 year after the last dose. What side effects may I notice from receiving this medicine? Side effects that you should report to your doctor or health care professional as soon as possible:  allergic reactions like skin rash, itching or hives, swelling of the face, lips, or tongue  feeling faint or lightheaded, falls  pain, tingling, numbness, or weakness in the legs  signs and symptoms of infection like fever or chills; cough; flu-like symptoms; sore throat  vaginal bleeding Side effects that usually do not require medical attention (report to your doctor or health care professional if they continue or are bothersome):  aches, pains  constipation  diarrhea  headache  hot flashes  nausea, vomiting  pain at site where injected  stomach pain This list may not describe all possible side effects. Call your doctor for medical advice about side effects. You may report side effects to FDA at 1-800-FDA-1088. Where should I keep my medicine? This drug is given in a hospital or clinic and will  not be stored at home. NOTE: This sheet is a summary. It may not cover all possible information. If you have questions about this medicine, talk to your doctor, pharmacist, or health care provider.  2021 Elsevier/Gold Standard (2017-11-25 11:34:41) Denosumab injection What is this  medicine? DENOSUMAB (den oh sue mab) slows bone breakdown. Prolia is used to treat osteoporosis in women after menopause and in men, and in people who are taking corticosteroids for 6 months or more. Delton See is used to treat a high calcium level due to cancer and to prevent bone fractures and other bone problems caused by multiple myeloma or cancer bone metastases. Delton See is also used to treat giant cell tumor of the bone. This medicine may be used for other purposes; ask your health care provider or pharmacist if you have questions. COMMON BRAND NAME(S): Prolia, XGEVA What should I tell my health care provider before I take this medicine? They need to know if you have any of these conditions:  dental disease  having surgery or tooth extraction  infection  kidney disease  low levels of calcium or Vitamin D in the blood  malnutrition  on hemodialysis  skin conditions or sensitivity  thyroid or parathyroid disease  an unusual reaction to denosumab, other medicines, foods, dyes, or preservatives  pregnant or trying to get pregnant  breast-feeding How should I use this medicine? This medicine is for injection under the skin. It is given by a health care professional in a hospital or clinic setting. A special MedGuide will be given to you before each treatment. Be sure to read this information carefully each time. For Prolia, talk to your pediatrician regarding the use of this medicine in children. Special care may be needed. For Delton See, talk to your pediatrician regarding the use of this medicine in children. While this drug may be prescribed for children as young as 13 years for selected conditions, precautions do apply. Overdosage: If you think you have taken too much of this medicine contact a poison control center or emergency room at once. NOTE: This medicine is only for you. Do not share this medicine with others. What if I miss a dose? It is important not to miss your dose. Call  your doctor or health care professional if you are unable to keep an appointment. What may interact with this medicine? Do not take this medicine with any of the following medications:  other medicines containing denosumab This medicine may also interact with the following medications:  medicines that lower your chance of fighting infection  steroid medicines like prednisone or cortisone This list may not describe all possible interactions. Give your health care provider a list of all the medicines, herbs, non-prescription drugs, or dietary supplements you use. Also tell them if you smoke, drink alcohol, or use illegal drugs. Some items may interact with your medicine. What should I watch for while using this medicine? Visit your doctor or health care professional for regular checks on your progress. Your doctor or health care professional may order blood tests and other tests to see how you are doing. Call your doctor or health care professional for advice if you get a fever, chills or sore throat, or other symptoms of a cold or flu. Do not treat yourself. This drug may decrease your body's ability to fight infection. Try to avoid being around people who are sick. You should make sure you get enough calcium and vitamin D while you are taking this medicine, unless your doctor tells  you not to. Discuss the foods you eat and the vitamins you take with your health care professional. See your dentist regularly. Brush and floss your teeth as directed. Before you have any dental work done, tell your dentist you are receiving this medicine. Do not become pregnant while taking this medicine or for 5 months after stopping it. Talk with your doctor or health care professional about your birth control options while taking this medicine. Women should inform their doctor if they wish to become pregnant or think they might be pregnant. There is a potential for serious side effects to an unborn child. Talk to your  health care professional or pharmacist for more information. What side effects may I notice from receiving this medicine? Side effects that you should report to your doctor or health care professional as soon as possible:  allergic reactions like skin rash, itching or hives, swelling of the face, lips, or tongue  bone pain  breathing problems  dizziness  jaw pain, especially after dental work  redness, blistering, peeling of the skin  signs and symptoms of infection like fever or chills; cough; sore throat; pain or trouble passing urine  signs of low calcium like fast heartbeat, muscle cramps or muscle pain; pain, tingling, numbness in the hands or feet; seizures  unusual bleeding or bruising  unusually weak or tired Side effects that usually do not require medical attention (report to your doctor or health care professional if they continue or are bothersome):  constipation  diarrhea  headache  joint pain  loss of appetite  muscle pain  runny nose  tiredness  upset stomach This list may not describe all possible side effects. Call your doctor for medical advice about side effects. You may report side effects to FDA at 1-800-FDA-1088. Where should I keep my medicine? This medicine is only given in a clinic, doctor's office, or other health care setting and will not be stored at home. NOTE: This sheet is a summary. It may not cover all possible information. If you have questions about this medicine, talk to your doctor, pharmacist, or health care provider.  2021 Elsevier/Gold Standard (2017-12-24 16:10:44)

## 2020-12-03 LAB — CANCER ANTIGEN 27.29: CA 27.29: 87.3 U/mL — ABNORMAL HIGH (ref 0.0–38.6)

## 2020-12-06 DIAGNOSIS — C7951 Secondary malignant neoplasm of bone: Secondary | ICD-10-CM | POA: Diagnosis not present

## 2020-12-06 DIAGNOSIS — R7303 Prediabetes: Secondary | ICD-10-CM | POA: Diagnosis not present

## 2020-12-06 DIAGNOSIS — I10 Essential (primary) hypertension: Secondary | ICD-10-CM | POA: Diagnosis not present

## 2020-12-06 DIAGNOSIS — C50912 Malignant neoplasm of unspecified site of left female breast: Secondary | ICD-10-CM | POA: Diagnosis not present

## 2020-12-10 ENCOUNTER — Other Ambulatory Visit: Payer: Self-pay

## 2020-12-10 ENCOUNTER — Ambulatory Visit (INDEPENDENT_AMBULATORY_CARE_PROVIDER_SITE_OTHER): Payer: BC Managed Care – PPO | Admitting: Plastic Surgery

## 2020-12-10 ENCOUNTER — Encounter: Payer: Self-pay | Admitting: Plastic Surgery

## 2020-12-10 VITALS — BP 166/77 | HR 88 | Ht 64.0 in | Wt 165.0 lb

## 2020-12-10 DIAGNOSIS — Z9012 Acquired absence of left breast and nipple: Secondary | ICD-10-CM

## 2020-12-10 DIAGNOSIS — Z9011 Acquired absence of right breast and nipple: Secondary | ICD-10-CM

## 2020-12-10 DIAGNOSIS — Z9889 Other specified postprocedural states: Secondary | ICD-10-CM

## 2020-12-10 DIAGNOSIS — C50512 Malignant neoplasm of lower-outer quadrant of left female breast: Secondary | ICD-10-CM | POA: Diagnosis not present

## 2020-12-10 DIAGNOSIS — Z170 Estrogen receptor positive status [ER+]: Secondary | ICD-10-CM

## 2020-12-10 NOTE — Progress Notes (Signed)
   Subjective:    Patient ID: Natasha Graham, female    DOB: 04-19-1961, 60 y.o.   MRN: [id number]  The patient is a 60 year old female here for follow-up on her left breast reconstruction.  We have talked about options to get a larger breast.  The patient wants to avoid a muscle flap if possible.  She would like to go ahead and have the exchange surgery to match the size she currently has on the left.  The patient currently has 310 cc in a 455 cc expander.  She understands this would mean decreasing the size of the right breast.  The right breast was done on 1/21 and she has a Mentor smooth round high-profile saline (Mentor Smooth Round High Profile saline 500cc. Ref #[phone].  Serial Number [id number] filled to 600cc) 12/15 .       Review of Systems  Constitutional: Negative.  Negative for activity change and appetite change.  HENT: Negative.   Eyes: Negative.   Respiratory: Negative for chest tightness.   Cardiovascular: Negative for leg swelling.  Gastrointestinal: Negative.   Endocrine: Negative.   Genitourinary: Negative.   Musculoskeletal: Negative.   Hematological: Negative.   Psychiatric/Behavioral: Negative.        Objective:   Physical Exam Vitals and nursing note reviewed.  Constitutional:      Appearance: Normal appearance.  HENT:     Head: Normocephalic and atraumatic.  Cardiovascular:     Rate and Rhythm: Normal rate.     Pulses: Normal pulses.  Pulmonary:     Effort: Pulmonary effort is normal. No respiratory distress.  Abdominal:     General: Abdomen is flat. There is no distension.     Tenderness: There is no abdominal tenderness.  Skin:    General: Skin is warm.     Capillary Refill: Capillary refill takes less than 2 seconds.     Comments: Hyperpigmentation noted that radiated site of left breast.  Neurological:     General: No focal deficit present.     Mental Status: She is alert and oriented to person, place, and time.  Psychiatric:         Mood and Affect: Mood normal.        Behavior: Behavior normal.        Thought Content: Thought content normal.           Assessment & Plan:     ICD-10-CM   1. Malignant neoplasm of lower-outer quadrant of left breast of female, estrogen receptor positive ([place])  C50.512    Z17.0   2. S/P mastectomy, left  Z90.12   3. Acquired absence of right breast  Z90.11   4. Status post right breast reconstruction  Z98.890     The patient would like to move ahead with the following plan: Removal of left breast expander and placement of saline implant.  Exchange of right breast saline implant for smaller implant.  Lipo filling will be done at a later time. Pictures were obtained of the patient and placed in the chart with the patient's or guardian's permission.

## 2020-12-12 ENCOUNTER — Other Ambulatory Visit: Payer: Self-pay

## 2020-12-12 ENCOUNTER — Encounter (HOSPITAL_BASED_OUTPATIENT_CLINIC_OR_DEPARTMENT_OTHER): Payer: Self-pay | Admitting: Obstetrics and Gynecology

## 2020-12-12 NOTE — Progress Notes (Addendum)
Spoke w/ via phone for pre-op interview---pt Lab needs dos----   I stat, ekg  (per anesthesia) sugery orders pending          Lab results------none COVID test ------12-18-2020 815 Arrive at -------700 am 12-20-2020 NPO after MN NO Solid Food.  Clear liquids from MN until---600 am then npo Med rec completed Medications to take morning of surgery -----none Diabetic medication -----n/a Driver/caregiver for 24 hours after surgery spouse clifford will stay Patient instructed to bring photo id and insurance card day of surgery   for 24 hours after surgery  Patient Special Instructions -----none Pre-Op special Istructions ----- surgery orders req dr Ronita Hipps epic ib  Patient verbalized understanding of instructions that were given at this phone interview. Patient denies shortness of breath, chest pain, fever, cough at this phone interview.

## 2020-12-18 ENCOUNTER — Other Ambulatory Visit: Payer: Self-pay | Admitting: Obstetrics and Gynecology

## 2020-12-18 ENCOUNTER — Other Ambulatory Visit (HOSPITAL_COMMUNITY)
Admission: RE | Admit: 2020-12-18 | Discharge: 2020-12-18 | Disposition: A | Discharge status: Home / Self Care | Payer: BC Managed Care – PPO | Source: Ambulatory Visit | Attending: Obstetrics and Gynecology | Admitting: Obstetrics and Gynecology

## 2020-12-18 DIAGNOSIS — Z01812 Encounter for preprocedural laboratory examination: Secondary | ICD-10-CM | POA: Insufficient documentation

## 2020-12-18 DIAGNOSIS — Z20822 Contact with and (suspected) exposure to covid-19: Secondary | ICD-10-CM | POA: Insufficient documentation

## 2020-12-18 DIAGNOSIS — N8502 Endometrial intraepithelial neoplasia [EIN]: Secondary | ICD-10-CM | POA: Diagnosis not present

## 2020-12-18 DIAGNOSIS — Z923 Personal history of irradiation: Secondary | ICD-10-CM | POA: Diagnosis not present

## 2020-12-18 DIAGNOSIS — Z853 Personal history of malignant neoplasm of breast: Secondary | ICD-10-CM | POA: Diagnosis not present

## 2020-12-18 DIAGNOSIS — N950 Postmenopausal bleeding: Secondary | ICD-10-CM | POA: Diagnosis not present

## 2020-12-18 DIAGNOSIS — Z79899 Other long term (current) drug therapy: Secondary | ICD-10-CM | POA: Diagnosis not present

## 2020-12-18 LAB — SARS CORONAVIRUS 2 (TAT 6-24 HRS): SARS Coronavirus 2: NEGATIVE

## 2020-12-20 ENCOUNTER — Encounter (HOSPITAL_BASED_OUTPATIENT_CLINIC_OR_DEPARTMENT_OTHER)
Admission: RE | Disposition: A | Discharge status: Home / Self Care | Payer: Self-pay | Source: Home / Self Care | Attending: Obstetrics and Gynecology

## 2020-12-20 ENCOUNTER — Ambulatory Visit (HOSPITAL_BASED_OUTPATIENT_CLINIC_OR_DEPARTMENT_OTHER)
Admission: RE | Admit: 2020-12-20 | Discharge: 2020-12-20 | Disposition: A | Discharge status: Home / Self Care | Payer: BC Managed Care – PPO | Attending: Obstetrics and Gynecology | Admitting: Obstetrics and Gynecology

## 2020-12-20 ENCOUNTER — Encounter (HOSPITAL_BASED_OUTPATIENT_CLINIC_OR_DEPARTMENT_OTHER): Payer: Self-pay | Admitting: Obstetrics and Gynecology

## 2020-12-20 ENCOUNTER — Ambulatory Visit (HOSPITAL_BASED_OUTPATIENT_CLINIC_OR_DEPARTMENT_OTHER): Payer: BC Managed Care – PPO | Admitting: Anesthesiology

## 2020-12-20 DIAGNOSIS — Z20822 Contact with and (suspected) exposure to covid-19: Secondary | ICD-10-CM | POA: Diagnosis not present

## 2020-12-20 DIAGNOSIS — C7951 Secondary malignant neoplasm of bone: Secondary | ICD-10-CM | POA: Diagnosis not present

## 2020-12-20 DIAGNOSIS — N8501 Benign endometrial hyperplasia: Secondary | ICD-10-CM | POA: Diagnosis not present

## 2020-12-20 DIAGNOSIS — N840 Polyp of corpus uteri: Secondary | ICD-10-CM | POA: Diagnosis not present

## 2020-12-20 DIAGNOSIS — N950 Postmenopausal bleeding: Secondary | ICD-10-CM | POA: Insufficient documentation

## 2020-12-20 DIAGNOSIS — C50512 Malignant neoplasm of lower-outer quadrant of left female breast: Secondary | ICD-10-CM | POA: Diagnosis not present

## 2020-12-20 DIAGNOSIS — I10 Essential (primary) hypertension: Secondary | ICD-10-CM | POA: Diagnosis not present

## 2020-12-20 DIAGNOSIS — N8502 Endometrial intraepithelial neoplasia [EIN]: Secondary | ICD-10-CM | POA: Diagnosis not present

## 2020-12-20 DIAGNOSIS — Z79899 Other long term (current) drug therapy: Secondary | ICD-10-CM | POA: Diagnosis not present

## 2020-12-20 DIAGNOSIS — Z923 Personal history of irradiation: Secondary | ICD-10-CM | POA: Diagnosis not present

## 2020-12-20 DIAGNOSIS — Z853 Personal history of malignant neoplasm of breast: Secondary | ICD-10-CM | POA: Insufficient documentation

## 2020-12-20 HISTORY — DX: Postmenopausal bleeding: N95.0

## 2020-12-20 HISTORY — PX: DILATATION & CURETTAGE/HYSTEROSCOPY WITH MYOSURE: SHX6511

## 2020-12-20 HISTORY — DX: Personal history of irradiation: Z92.3

## 2020-12-20 LAB — POCT I-STAT, CHEM 8
BUN: 21 mg/dL (ref 8–23)
Calcium, Ion: 1.23 mmol/L (ref 1.15–1.40)
Chloride: 100 mmol/L (ref 98–111)
Creatinine, Ser: 0.8 mg/dL (ref 0.44–1.00)
Glucose, Bld: 117 mg/dL — ABNORMAL HIGH (ref 70–99)
HCT: 36 % (ref 36.0–46.0)
Hemoglobin: 12.2 g/dL (ref 12.0–15.0)
Potassium: 4.1 mmol/L (ref 3.5–5.1)
Sodium: 141 mmol/L (ref 135–145)
TCO2: 28 mmol/L (ref 22–32)

## 2020-12-20 SURGERY — DILATATION & CURETTAGE/HYSTEROSCOPY WITH MYOSURE
Anesthesia: General

## 2020-12-20 MED ORDER — ONDANSETRON HCL 4 MG/2ML IJ SOLN
INTRAMUSCULAR | Status: DC | PRN
  Administered 2020-12-20: 4 mg via INTRAVENOUS

## 2020-12-20 MED ORDER — ACETAMINOPHEN 500 MG PO TABS
1000.0000 mg | ORAL_TABLET | Freq: Once | ORAL | Status: AC
  Administered 2020-12-20: 1000 mg via ORAL

## 2020-12-20 MED ORDER — DEXAMETHASONE SODIUM PHOSPHATE 10 MG/ML IJ SOLN
INTRAMUSCULAR | Status: AC
  Filled 2020-12-20: qty 1

## 2020-12-20 MED ORDER — ACETAMINOPHEN 500 MG PO TABS
ORAL_TABLET | ORAL | Status: AC
  Filled 2020-12-20: qty 2

## 2020-12-20 MED ORDER — KETOROLAC TROMETHAMINE 30 MG/ML IJ SOLN
INTRAMUSCULAR | Status: AC
  Filled 2020-12-20: qty 1

## 2020-12-20 MED ORDER — FENTANYL CITRATE (PF) 100 MCG/2ML IJ SOLN: 25.0000 ug | INTRAMUSCULAR | Status: DC | PRN

## 2020-12-20 MED ORDER — CEFAZOLIN SODIUM-DEXTROSE 2-4 GM/100ML-% IV SOLN
INTRAVENOUS | Status: AC
  Filled 2020-12-20: qty 100

## 2020-12-20 MED ORDER — VASOPRESSIN 20 UNIT/ML IV SOLN
INTRAVENOUS | Status: DC | PRN
  Administered 2020-12-20: 7.2 [IU]

## 2020-12-20 MED ORDER — KETOROLAC TROMETHAMINE 30 MG/ML IJ SOLN
INTRAMUSCULAR | Status: DC | PRN
  Administered 2020-12-20: 30 mg via INTRAVENOUS

## 2020-12-20 MED ORDER — FENTANYL CITRATE (PF) 100 MCG/2ML IJ SOLN
INTRAMUSCULAR | Status: DC | PRN
  Administered 2020-12-20 (×2): 50 ug via INTRAVENOUS

## 2020-12-20 MED ORDER — SODIUM CHLORIDE 0.9 % IR SOLN
Status: DC | PRN
  Administered 2020-12-20: 3000 mL

## 2020-12-20 MED ORDER — PROPOFOL 10 MG/ML IV BOLUS
INTRAVENOUS | Status: DC | PRN
  Administered 2020-12-20: 130 mg via INTRAVENOUS

## 2020-12-20 MED ORDER — BUPIVACAINE HCL (PF) 0.25 % IJ SOLN
INTRAMUSCULAR | Status: DC | PRN
  Administered 2020-12-20: 20 mL

## 2020-12-20 MED ORDER — MIDAZOLAM HCL 2 MG/2ML IJ SOLN
INTRAMUSCULAR | Status: DC | PRN
  Administered 2020-12-20: 2 mg via INTRAVENOUS

## 2020-12-20 MED ORDER — FENTANYL CITRATE (PF) 100 MCG/2ML IJ SOLN
INTRAMUSCULAR | Status: AC
  Filled 2020-12-20: qty 2

## 2020-12-20 MED ORDER — LIDOCAINE 2% (20 MG/ML) 5 ML SYRINGE
INTRAMUSCULAR | Status: DC | PRN
  Administered 2020-12-20: 100 mg via INTRAVENOUS

## 2020-12-20 MED ORDER — DEXAMETHASONE SODIUM PHOSPHATE 10 MG/ML IJ SOLN
INTRAMUSCULAR | Status: DC | PRN
  Administered 2020-12-20: 10 mg via INTRAVENOUS

## 2020-12-20 MED ORDER — PROMETHAZINE HCL 25 MG/ML IJ SOLN: 6.2500 mg | INTRAMUSCULAR | Status: DC | PRN

## 2020-12-20 MED ORDER — SODIUM CHLORIDE (PF) 0.9 % IJ SOLN
INTRAMUSCULAR | Status: DC | PRN
  Administered 2020-12-20: 18 mL via INTRAVENOUS

## 2020-12-20 MED ORDER — CEFAZOLIN SODIUM-DEXTROSE 2-4 GM/100ML-% IV SOLN
2.0000 g | INTRAVENOUS | Status: AC
  Administered 2020-12-20: 2 g via INTRAVENOUS

## 2020-12-20 MED ORDER — LIDOCAINE 2% (20 MG/ML) 5 ML SYRINGE
INTRAMUSCULAR | Status: AC
  Filled 2020-12-20: qty 5

## 2020-12-20 MED ORDER — POVIDONE-IODINE 10 % EX SWAB: 2.0000 "application " | Freq: Once | CUTANEOUS | Status: DC

## 2020-12-20 MED ORDER — LACTATED RINGERS IV SOLN: INTRAVENOUS | Status: DC

## 2020-12-20 MED ORDER — SCOPOLAMINE 1 MG/3DAYS TD PT72
MEDICATED_PATCH | TRANSDERMAL | Status: AC
  Filled 2020-12-20: qty 1

## 2020-12-20 MED ORDER — SCOPOLAMINE 1 MG/3DAYS TD PT72
1.0000 | MEDICATED_PATCH | Freq: Once | TRANSDERMAL | Status: DC
  Administered 2020-12-20: 1.5 mg via TRANSDERMAL

## 2020-12-20 MED ORDER — OXYCODONE HCL 5 MG/5ML PO SOLN: 5.0000 mg | Freq: Once | ORAL | Status: DC | PRN

## 2020-12-20 MED ORDER — TRAMADOL HCL 50 MG PO TABS: 50.0000 mg | ORAL_TABLET | Freq: Four times a day (QID) | ORAL | 0 refills | Status: DC | PRN

## 2020-12-20 MED ORDER — ONDANSETRON HCL 4 MG/2ML IJ SOLN
INTRAMUSCULAR | Status: AC
  Filled 2020-12-20: qty 2

## 2020-12-20 MED ORDER — MIDAZOLAM HCL 2 MG/2ML IJ SOLN
INTRAMUSCULAR | Status: AC
  Filled 2020-12-20: qty 2

## 2020-12-20 MED ORDER — OXYCODONE HCL 5 MG PO TABS: 5.0000 mg | ORAL_TABLET | Freq: Once | ORAL | Status: DC | PRN

## 2020-12-20 MED ORDER — PROPOFOL 10 MG/ML IV BOLUS
INTRAVENOUS | Status: AC
  Filled 2020-12-20: qty 20

## 2020-12-20 SURGICAL SUPPLY — 19 items
BAG DECANTER FOR FLEXI CONT (MISCELLANEOUS) ×2 IMPLANT
CATH ROBINSON RED A/P 16FR (CATHETERS) ×2 IMPLANT
DECANTER SPIKE VIAL GLASS SM (MISCELLANEOUS) ×4 IMPLANT
DEVICE MYOSURE LITE (MISCELLANEOUS) ×2 IMPLANT
DEVICE MYOSURE REACH (MISCELLANEOUS) IMPLANT
GLOVE SURG ENC MOIS LTX SZ7.5 (GLOVE) ×2 IMPLANT
GLOVE SURG UNDER POLY LF SZ7 (GLOVE) ×2 IMPLANT
GOWN STRL REUS W/TWL LRG LVL3 (GOWN DISPOSABLE) ×4 IMPLANT
IV NS IRRIG 3000ML ARTHROMATIC (IV SOLUTION) ×2 IMPLANT
KIT PROCEDURE FLUENT (KITS) ×2 IMPLANT
KIT TURNOVER CYSTO (KITS) ×2 IMPLANT
NDL SAFETY ECLIPSE 18X1.5 (NEEDLE) ×1 IMPLANT
NEEDLE HYPO 18GX1.5 SHARP (NEEDLE) ×2
NEEDLE SPNL 22GX3.5 QUINCKE BK (NEEDLE) ×2 IMPLANT
PACK VAGINAL MINOR WOMEN LF (CUSTOM PROCEDURE TRAY) ×2 IMPLANT
PAD OB MATERNITY 4.3X12.25 (PERSONAL CARE ITEMS) ×2 IMPLANT
SEAL CERVICAL OMNI LOK (ABLATOR) IMPLANT
SEAL ROD LENS SCOPE MYOSURE (ABLATOR) ×2 IMPLANT
SYR 5ML LL (SYRINGE) ×2 IMPLANT

## 2020-12-20 NOTE — Discharge Instructions (Signed)

## 2020-12-20 NOTE — Op Note (Signed)
NAMEKESLEE, HARRINGTON MEDICAL RECORD NO: [id number] ACCOUNT NO: [id number] DATE OF BIRTH: 01-16-1961 FACILITY: [facility] LOCATION: [facility] PHYSICIAN: Lovenia Kim, MD  Operative Report   DATE OF PROCEDURE: 12/20/2020  PREOPERATIVE DIAGNOSIS:  Postmenopausal bleeding with atypical hyperplasia focally and endometrial polyp by endometrial biopsy.  POSTOPERATIVE DIAGNOSIS:  Postmenopausal bleeding with atypical hyperplasia focally and endometrial polyp by endometrial biopsy.  PROCEDURES:  Diagnostic hysteroscopy, D and C, MyoSure resection of anterior endometrial wall polyp.  SURGEON:  Lovenia Kim, MD  ASSISTANT:  None.  ANESTHESIA:  Local, general.  ESTIMATED BLOOD LOSS:  Less than 10 mL.  COMPLICATIONS:  None.  DRAINS:  None.  COUNTS:  Correct.  DISPOSITION:  The patient was taken to the recovery room in good condition.  SPECIMEN:  Endometrial curettings and resection to pathology.  FLUID DEFICIT:  Less than 50 mL.  DESCRIPTION OF PROCEDURE:  After being apprised of the risks of anesthesia, infection, bleeding, injury to surrounding organs, possible need for repair, delayed risks and complications include bowel and bladder injury, possible need for repair, inability  to prevent recurrent cancer, the patient was brought to the operating room where she was administered general anesthetic without complications.  Prepped and draped in usual sterile fashion.  Feet were placed in [place].  The patient was  catheterized until the bladder was empty.  Exam under anesthesia revealed a small anteflexed uterus without any evidence of any adnexal masses. Dilute vasopressin solution at cervicovaginal junction at 1500 and 2100. Paracervical block 20cc Marcaine solution. At this time, her cervix was easily dilated up to 21 Pratt dilator.  Hysteroscope was placed.  Visualization  revealed thickening along the anterior left fundus and normal tubal ostia.  No other obvious  endometrial defects.  No evidence of perforation at this time.  MyoSure device was entered, and resection of this anterior wall thickening was done without  difficulty.  Minimal bleeding noted.  D and C performed using sharp curettage in a 4-quadrant method.  The procedure terminated.  Fluid deficit and blood loss as noted.  The patient tolerated the procedure well, was awakened, and transferred to recovery  in good condition.   [facility] D: 12/20/2020 9:34:23 am T: 12/20/2020 11:02:00 am  JOB: [id number]/ [id number]

## 2020-12-20 NOTE — Brief Op Note (Signed)
12/20/2020  9:29 AM  PATIENT:  Natasha Graham  60 y.o. female  PRE-OPERATIVE DIAGNOSIS:  Postmenopausal Bleeding, Endometrial Atypical Hyperplasia  POST-OPERATIVE DIAGNOSIS:  Postmenopausal Bleeding, Endometrial Atypical Hyperplasia  PROCEDURE:  Procedure(s): DILATATION & CURETTAGE/HYSTEROSCOPY WITH  MYOSURE (N/A)  RESECTION OF ANTERIOR WALL ENDOMETRIAL POLYP   SURGEON:  Surgeon(s) and Role:    * Brien Few, MD - Primary  PHYSICIAN ASSISTANT: NONE  ASSISTANTS: none   ANESTHESIA:   local and general  EBL:  10 mL   BLOOD ADMINISTERED:none  DRAINS: none   LOCAL MEDICATIONS USED:  MARCAINE    and Amount: 20 ml  SPECIMEN:  Source of Specimen:  POLYP AND EMC  DISPOSITION OF SPECIMEN:  PATHOLOGY  COUNTS:  YES  TOURNIQUET:  * No tourniquets in log *  DICTATION: .Other Dictation: Dictation Number [id number]  PLAN OF CARE: Discharge to home after PACU  PATIENT DISPOSITION:  PACU - hemodynamically stable.   Delay start of Pharmacological VTE agent (>24hrs) due to surgical blood loss or risk of bleeding: yes

## 2020-12-20 NOTE — Anesthesia Postprocedure Evaluation (Signed)
Anesthesia Post Note  Patient: Natasha Graham  Procedure(s) Performed: DILATATION & CURETTAGE/HYSTEROSCOPY WITH  MYOSURE (N/A )     Patient location during evaluation: PACU Anesthesia Type: General Level of consciousness: awake and alert and oriented Pain management: pain level controlled Vital Signs Assessment: post-procedure vital signs reviewed and stable Respiratory status: spontaneous breathing, nonlabored ventilation and respiratory function stable Cardiovascular status: blood pressure returned to baseline Postop Assessment: no apparent nausea or vomiting Anesthetic complications: no   No complications documented.  Last Vitals:  Vitals:   12/20/20 0945 12/20/20 1000  BP: 129/74 (!) 143/67  Pulse: 72 70  Resp: 15 14  Temp:    SpO2: 95% 100%    Last Pain:  Vitals:   12/20/20 1000  TempSrc:   PainSc: 0-No pain                 Brennan Bailey

## 2020-12-20 NOTE — H&P (Signed)
Natasha Graham is an 60 y.o. female. PMB for surgical evaluation of atypical hyperplasia in a polyp per ebx.   Pertinent Gynecological History: Menses: post-menopausal Bleeding: post menopausal bleeding Contraception: none DES exposure: denies Blood transfusions: none Sexually transmitted diseases: no past history Previous GYN Procedures: DNC  Last mammogram: normal Date: 2022 Last pap: normal Date: 2022 OB History: G2, P2   Menstrual History: Menarche age: 19 Patient's last menstrual period was 10/16/2013.    Past Medical History:  Diagnosis Date  . Cancer [facility])    breast cancer left 2021 roght 2015  . Family history of brain cancer   . Family history of breast cancer   . Family history of colon cancer   . Family history of stomach cancer   . History of radiation therapy last done February 06 2020  . Hypertension   . PMB (postmenopausal bleeding)   . PONV (postoperative nausea and vomiting)    likes scopolamine patch  . Scoliosis   . Wears glasses   . Wears glasses     Past Surgical History:  Procedure Laterality Date  . AUGMENTATION MAMMAPLASTY Right    2015 post mastectomy  . AXILLARY LYMPH NODE DISSECTION Left 11/13/2019   Procedure: LEFT AXILLARY LYMPH NODE DISSECTION;  Surgeon: Rolm Bookbinder, MD;  Location: [facility];  Service: General;  Laterality: Left;  . BREAST RECONSTRUCTION WITH PLACEMENT OF TISSUE EXPANDER AND FLEX HD (ACELLULAR HYDRATED DERMIS) Right 05/16/2014   Procedure: IMMEDIATE RIGHT BREAST RECONSTRUCTION WITH PLACEMENT OF TISSUE EXPANDER AND FLEX HD (ACELLULAR HYDRATED DERMIS);  Surgeon: Theodoro Kos, DO;  Location: [facility];  Service: Plastics;  Laterality: Right;  . BREAST RECONSTRUCTION WITH PLACEMENT OF TISSUE EXPANDER AND FLEX HD (ACELLULAR HYDRATED DERMIS) Left 09/27/2019   Procedure: LEFT BREAST RECONSTRUCTION WITH PLACEMENT OF TISSUE EXPANDER AND FLEX HD (ACELLULAR HYDRATED DERMIS);  Surgeon: Wallace Going, DO;  Location:  [facility];  Service: Plastics;  Laterality: Left;  . BREAST SURGERY     right breast excisional biopsy  . DILATION AND CURETTAGE OF UTERUS    . HYSTEROSCOPY WITH D & C N/A 09/20/2015   Procedure: DILATATION AND CURETTAGE /HYSTEROSCOPY;  Surgeon: Brien Few, MD;  Location: [facility];  Service: Gynecology;  Laterality: N/A;  . LIPOSUCTION Bilateral 08/08/2014   Procedure: LIPO SUCTION ;  Surgeon: Theodoro Kos, DO;  Location: [facility];  Service: Plastics;  Laterality: Bilateral;  . MASTECTOMY Right 05/16/2014   placement of acellular dermal matrix & tissue expanders   . MASTOPEXY Left 08/08/2014   Procedure:  MASTOPEXY FOR SYMMETRY;  Surgeon: Theodoro Kos, DO;  Location: [facility];  Service: Plastics;  Laterality: Left;  . NIPPLE SPARING MASTECTOMY WITH SENTINEL LYMPH NODE BIOPSY Left 09/27/2019   Procedure: LEFT NIPPLE SPARING MASTECTOMY WITH LEFT AXILLARY SENTINEL LYMPH NODE BIOPSY;  Surgeon: Rolm Bookbinder, MD;  Location: [facility];  Service: General;  Laterality: Left;  . REDUCTION MAMMAPLASTY Left    2015  . REMOVAL OF TISSUE EXPANDER AND PLACEMENT OF IMPLANT Right 08/08/2014   Procedure: REMOVAL OF RIGHT  TISSUE EXPANDERS WITH PLACEMENT OF RIGHT BREAST IMPLANTS WITH LIPO SUCTION ;  Surgeon: Theodoro Kos, DO;  Location: [facility];  Service: Plastics;  Laterality: Right;  . SCAR REVISION Left 11/13/2019   Procedure: EXCISION OF LEFT MASTECTOMY SKIN;  Surgeon: Rolm Bookbinder, MD;  Location: [facility];  Service: General;  Laterality: Left;  . scoliosis  [place]  rods-age 79  . TONSILLECTOMY  as child    Family History  Problem Relation Age of Onset  . Heart disease Mother   . Cancer Father        liver  . Colon cancer Father 32  . Head & neck cancer Father        oral cancer - dx in 52s-50s  . Breast cancer Paternal Aunt        dx <50  . Brain cancer Brother         possible meningioma  . Cancer Maternal Aunt        liver/lung cancer  . Stomach cancer Paternal Uncle   . Heart disease Maternal Grandmother   . Lymphoma Maternal Grandfather        NHL  . Heart attack Paternal Grandfather   . Cancer Paternal Uncle        NOS    Social History:  reports that she has never smoked. She has never used smokeless tobacco. She reports that she does not drink alcohol and does not use drugs.  Allergies: No Known Allergies  No medications prior to admission.    Review of Systems  Constitutional: Negative.   All other systems reviewed and are negative.   Height 5\' 4"  (1.626 m), weight 74.8 kg, last menstrual period 10/16/2013. Physical Exam Vitals reviewed.  Constitutional:      Appearance: Normal appearance. She is normal weight.  HENT:     Head: Normocephalic and atraumatic.  Cardiovascular:     Rate and Rhythm: Normal rate and regular rhythm.     Pulses: Normal pulses.     Heart sounds: Normal heart sounds.  Pulmonary:     Effort: Pulmonary effort is normal.     Breath sounds: Normal breath sounds.  Abdominal:     General: Bowel sounds are normal.     Palpations: Abdomen is soft.  Genitourinary:    General: Normal vulva.  Musculoskeletal:        General: Normal range of motion.     Cervical back: Normal range of motion and neck supple.  Skin:    General: Skin is warm and dry.  Neurological:     General: No focal deficit present.     Mental Status: She is alert and oriented to person, place, and time.  Psychiatric:        Mood and Affect: Mood normal.        Behavior: Behavior normal.     No results found for this or any previous visit (from the past 24 hour(s)).  No results found.  Assessment/Plan: PMB Atypical hyperplasia per ebx confined to a polyp Diag HS, and myosure Surgical consent done Risks of surgery to include injury to surrounding organs with need for repair discussed.   Candon Caras J 12/20/2020, 6:41 AM

## 2020-12-20 NOTE — Progress Notes (Signed)
Patient seen and examined. Consent witnessed and signed. No changes noted. Update completed. BP (!) 168/86   Pulse 83   Temp (!) 96.9 F (36.1 C) (Oral)   Resp 17   Ht 5\' 4"  (1.626 m)   Wt 77.7 kg   LMP 10/16/2013   SpO2 100%   BMI 29.42 kg/m   CBC    Component Value Date/Time   WBC 2.3 (L) 12/02/2020 0803   RBC 3.32 (L) 12/02/2020 0803   HGB 12.2 12/20/2020 0745   HGB 13.8 07/30/2014 1603   HCT 36.0 12/20/2020 0745   HCT 41.6 07/30/2014 1603   PLT 230 12/02/2020 0803   PLT 317 07/30/2014 1603   MCV 100.3 (H) 12/02/2020 0803   MCV 93.4 07/30/2014 1603   MCH 36.7 (H) 12/02/2020 0803   MCHC 36.6 (H) 12/02/2020 0803   RDW 15.7 (H) 12/02/2020 0803   RDW 12.2 07/30/2014 1603   LYMPHSABS 1.0 12/02/2020 0803   LYMPHSABS 1.8 07/30/2014 1603   MONOABS 0.2 12/02/2020 0803   MONOABS 0.5 07/30/2014 1603   EOSABS 0.0 12/02/2020 0803   EOSABS 0.1 07/30/2014 1603   BASOSABS 0.0 12/02/2020 0803   BASOSABS 0.0 07/30/2014 1603

## 2020-12-20 NOTE — Anesthesia Preprocedure Evaluation (Addendum)
Anesthesia Evaluation  Patient identified by MRN, date of birth, ID band Patient awake    Reviewed: Allergy & Precautions, NPO status , Patient's Chart, lab work & pertinent test results  History of Anesthesia Complications (+) PONV and history of anesthetic complications  Airway Mallampati: II  TM Distance: >3 FB Neck ROM: Full    Dental no notable dental hx.    Pulmonary neg pulmonary ROS,    Pulmonary exam normal        Cardiovascular hypertension, Pt. on medications Normal cardiovascular exam     Neuro/Psych negative neurological ROS  negative psych ROS   GI/Hepatic negative GI ROS, Neg liver ROS,   Endo/Other  negative endocrine ROS  Renal/GU negative Renal ROS  negative genitourinary   Musculoskeletal negative musculoskeletal ROS (+)   Abdominal   Peds  Hematology negative hematology ROS (+)   Anesthesia Other Findings Hx of breast ca  Reproductive/Obstetrics Postmenopausal Bleeding, Endometrial Atypical Hyperplasia                            Anesthesia Physical Anesthesia Plan  ASA: II  Anesthesia Plan: General   Post-op Pain Management:    Induction: Intravenous  PONV Risk Score and Plan: 4 or greater and Treatment may vary due to age or medical condition, Ondansetron, Dexamethasone, Midazolam and Scopolamine patch - Pre-op  Airway Management Planned: LMA  Additional Equipment: None  Intra-op Plan:   Post-operative Plan: Extubation in OR  Informed Consent: I have reviewed the patients History and Physical, chart, labs and discussed the procedure including the risks, benefits and alternatives for the proposed anesthesia with the patient or authorized representative who has indicated his/her understanding and acceptance.     Dental advisory given  Plan Discussed with: CRNA  Anesthesia Plan Comments:        Anesthesia Quick Evaluation

## 2020-12-20 NOTE — Anesthesia Procedure Notes (Signed)
Procedure Name: LMA Insertion Date/Time: 12/20/2020 9:04 AM Performed by: Mechele Claude, CRNA Pre-anesthesia Checklist: Patient identified, Emergency Drugs available, Suction available and Patient being monitored Patient Re-evaluated:Patient Re-evaluated prior to induction Oxygen Delivery Method: Circle system utilized Preoxygenation: Pre-oxygenation with 100% oxygen Induction Type: IV induction Ventilation: Mask ventilation without difficulty LMA: LMA inserted LMA Size: 4.0 Number of attempts: 1 Airway Equipment and Method: Bite block Placement Confirmation: positive ETCO2 Tube secured with: Tape Dental Injury: Teeth and Oropharynx as per pre-operative assessment

## 2020-12-20 NOTE — Transfer of Care (Signed)
Immediate Anesthesia Transfer of Care Note  Patient: Natasha Graham  Procedure(s) Performed: Procedure(s) (LRB): DILATATION & CURETTAGE/HYSTEROSCOPY WITH  MYOSURE (N/A)  Patient Location: PACU  Anesthesia Type: General  Level of Consciousness: awake, alert  and oriented  Airway & Oxygen Therapy: Patient Spontanous Breathing and Patient connected to nasal cannula oxygen  Post-op Assessment: Report given to PACU RN and Post -op Vital signs reviewed and stable  Post vital signs: Reviewed and stable  Complications: No apparent anesthesia complications Last Vitals:  Vitals Value Taken Time  BP 143/79 12/20/20 0935  Temp    Pulse 77 12/20/20 0941  Resp 15 12/20/20 0941  SpO2 94 % 12/20/20 0941  Vitals shown include unvalidated device data.  Last Pain:  Vitals:   12/20/20 0727  TempSrc: Oral  PainSc: 0-No pain      Patients Stated Pain Goal: 8 (83/33/83 2919)  Complications: No complications documented.

## 2020-12-23 ENCOUNTER — Encounter (HOSPITAL_BASED_OUTPATIENT_CLINIC_OR_DEPARTMENT_OTHER): Payer: Self-pay | Admitting: Obstetrics and Gynecology

## 2020-12-23 LAB — SURGICAL PATHOLOGY

## 2020-12-26 ENCOUNTER — Other Ambulatory Visit (HOSPITAL_COMMUNITY): Payer: Self-pay

## 2020-12-27 ENCOUNTER — Other Ambulatory Visit (HOSPITAL_COMMUNITY): Payer: Self-pay

## 2020-12-27 MED FILL — Palbociclib Tab 100 MG: ORAL | 28 days supply | Qty: 21 | Fill #1 | Status: CN

## 2020-12-29 NOTE — Progress Notes (Signed)
Natasha Graham  Telephone:([phone]) [phone] Fax:(336) [phone]     ID: Natasha Graham DOB: 03-13-1961  MR#: [id number]  VQM#:[id number]  Patient Care Team: Leonides Sake, MD as PCP - General (Family Medicine) Rolm Bookbinder, MD as Consulting Physician (General Surgery) Charizma Gardiner, Virgie Dad, MD as Consulting Physician (Oncology) Mauro Kaufmann, RN as Registered Nurse Rockwell Germany, RN as Registered Nurse Dillingham, Loel Lofty, DO as Attending Physician (Plastic Surgery) Gery Pray, MD as Consulting Physician (Radiation Oncology) OTHER MD: Sherrie Mustache MD   CHIEF COMPLAINT: new estrogen receptor positive stage IV breast cancer; remote noninvasive right breast cancer (s/p bilateral mastectomies)  CURRENT TREATMENT: Denosumab/Xgeva; fulvestrant; palbociclib/Ibrance   INTERVAL HISTORY: Natasha Graham returns today for follow up and treatment of her newly diagnosed invasive left breast cancer.  She continues on fulvestrant.  She has had no problems from this including no significant discomfort from the actual injections  She also receives denosumab, most recently on 12/02/2020.  She did not experience any bony aches or any symptoms suggestive of hypocalcemia  She started on Ibrance on 09/12/2020.  This dropped her [place], and her dose was decreased to 100 mg on 11/07/2020.  Since her last visit, she underwent D&C on 12/20/2020 under Dr. Ronita Hipps due to her recent postmenopausal bleeding felt likely secondary to fibroids. Pathology from the procedure ([id number]) showed complex atypical hyperplasia, no evidence of invasive carcinoma.-- Recall pt had been briefly (5 months) on tamoxifen before diagnosis of her stage IV disease.  Dr. Ronita Hipps has suggested proceeding with a hysterectomy  The patient is also scheduled for definite implant placement late June  REVIEW OF SYSTEMS: Natasha Graham did very well with her recent procedure.  She did not have any significant pain and did not even  fill out her pain medications.  She mowed for about 3 or 4 hours this weekend without any difficulty of course she is also taking care of grandchildren, doing housework and other chores around the farm.  She has an excellent functional status.  A detailed review of systems was otherwise stable.   COVID 19 VACCINATION STATUS: Patient had antibodies to the virus documented November 2020.  She is not planning on vaccinations   LEFT BREAST CANCER HISTORY: From the original intake note:  She had routine screening left mammogram on 06/14/2019 showing a developing asymmetry. She presented for left diagnostic mammogram and left breast ultrasound on 07/07/2019. Physical exam that day showed a palpable, firm, superficial mass in the left breast at 6 o'clock along the site of reduction mammoplasty scar (performed in 2015 with right mastectomy). Scans showed: breast density category B; 2.1 cm superficial mass involving the skin in the left breast at 6 o'clock; no enlarged adenopathy in the left axilla.   Accordingly on 07/12/2019 she proceeded to biopsy of the left breast area in question. The pathology from this procedure ([id number]) showed: invasive lobular carcinoma, grade 1, with perineural invasion present. Prognostic indicators significant for: estrogen receptor, 60% positive with moderate staining intensity and progesterone receptor, 0% negative. Proliferation marker Ki67 at <1%. HER2 equvocal by immunohistochemistry (2+), but negative by fluorescent in situ hybridization with a signals ratio 1.18 and number per cell 1.95.  She opted to proceed with left mastectomy on 09/27/2019 under Dr. Donne Hazel with immediate reconstruction under Dr. Marla Roe. 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  Two lymph nodes were biopsied. One showed metastatic lobular carcinoma with focus measuring 2.1 cm with evidence of extranodal extension. The other lymph node also showed metastatic lobular carcinoma with no lymphoid tissue present.  Biopsy of the nipple was also taken at that time and showed no evidence of invasive carcinoma.  Mammaprint was performed on the final surgical sample. This returned showing low risk.  RIGHT BREAST CANCER HISTORY: Natasha Graham has a history of right breast biopsy in late 2007 showing atypical ductal hyperplasia in the upper outer right breast.. Breast MRI 07/13/2006 showed postbiopsy changes in the upper right breast, and some right breast cysts. There were no solid masses however or areas of abnormal enhancement. There were no enlarged lymph nodes. Incidental finding of liver cysts was made. On 07/30/2006 she underwent needle localization of the calcifications in the outer right breast which also showed ([id number]) atypical ductal hyperplasia, with no malignancy.   On 10/21/2012 routine digital bilateral screening mammography at the breast Center showed breast density to be category C. In the right breast there were calcifications and architectural distortion to go with skin retraction. Additional views obtained 11/17/2012 showed extensive but stable punctate microcalcifications in the upper outer right breast. Many of these were superficial and not amenable to stereotactic biopsy. MRI was suggested but the patient was unable to undergo that test and therefore six-month mammography was obtained 05/15/2013. This showed the calcifications in the outer right breast to have been stable. Because of their extents and because of their superficial location they could not be all sampled all removed with biopsy. MRI was again discussed, but the patient has Harrington rods in place and also felt the cost of MRI was prohibitive.  On 11/13/2013 further  six-month follow-up showed a grouping of intermediate microcalcifications in the lateral right breast and these were biopsied 11/20/2013. The result of that procedure ([id number]) showed atypical ductal hyperplasia. Accordingly on 02/23/2014 the patient underwent right lumpectomy, with the pathology ([id number]) showing ductal carcinoma in situ, grade 2, measuring 0.7 cm. Estrogen receptor was 100% positive. Progesterone receptor was 96% positive. Both showed strong staining intensity. Margins were clear but the closest margin anteriorly was at 1 mm.  On 03/07/2014 the patient underwent postoperative mammography which showed post lumpectomy changes but also calcifications extending from the lumpectomy site anteriorly towards the nipple. On 03/12/2014 the patient underwent bilateral breast MRI. Breast composition here was described as category B. In the right breast there was a 4.8 cm fluidlike excisional cavity in the lower outer right breast. Adjacent to this was a focus of non-masslike enhancement measuring 1.9 cm. This is an area associated with the scar from the excision in 2007. There was also non-masslike enhancement inferior to the biopsy cavity measuring 3.8 cm. There were no abnormal lymph nodes and no masses or abnormal enhancement in the left breast. Specifically the left breast lower outer quadrant was unchanged from 2007 MRI.  Overall it was felt the linear non-masslike enhancement extending almost 4 cm inferior to the excisional cavity was sufficiently suspicious to warrant biopsy, and this was performed 03/29/2014. It again showed ductal carcinoma in situ, grade 1, estrogen receptor and progesterone receptor both 100% positive with strong staining intensity.  At this point the patient met with surgery and opted for definitive right mastectomy with immediate reconstruction. On 05/16/2014 she underwent simple mastectomy with sentinel lymph node sampling. The pathology ([id number]) showed  atypical ductal hyperplasia but no evidence of  neoplasia. All 5 sentinel lymph nodes were clear. The patient had immediate right breast reconstruction with tissue expander and dermal matrix placement.  She is scheduled for definitive implant placement 08/08/2014.  Her subsequent history is as detailed below   PAST MEDICAL HISTORY: Past Medical History:  Diagnosis Date  . Cancer [facility])    breast cancer left 2021 roght 2015  . Family history of brain cancer   . Family history of breast cancer   . Family history of colon cancer   . Family history of stomach cancer   . History of radiation therapy last done February 06 2020  . Hypertension   . PMB (postmenopausal bleeding)   . PONV (postoperative nausea and vomiting)    likes scopolamine patch  . Scoliosis   . Wears glasses   . Wears glasses     PAST SURGICAL HISTORY: Past Surgical History:  Procedure Laterality Date  . AUGMENTATION MAMMAPLASTY Right    2015 post mastectomy  . AXILLARY LYMPH NODE DISSECTION Left 11/13/2019   Procedure: LEFT AXILLARY LYMPH NODE DISSECTION;  Surgeon: Rolm Bookbinder, MD;  Location: [place];  Service: General;  Laterality: Left;  . BREAST RECONSTRUCTION WITH PLACEMENT OF TISSUE EXPANDER AND FLEX HD (ACELLULAR HYDRATED DERMIS) Right 05/16/2014   Procedure: IMMEDIATE RIGHT BREAST RECONSTRUCTION WITH PLACEMENT OF TISSUE EXPANDER AND FLEX HD (ACELLULAR HYDRATED DERMIS);  Surgeon: Theodoro Kos, DO;  Location: [place];  Service: Plastics;  Laterality: Right;  . BREAST RECONSTRUCTION WITH PLACEMENT OF TISSUE EXPANDER AND FLEX HD (ACELLULAR HYDRATED DERMIS) Left 09/27/2019   Procedure: LEFT BREAST RECONSTRUCTION WITH PLACEMENT OF TISSUE EXPANDER AND FLEX HD (ACELLULAR HYDRATED DERMIS);  Surgeon: Wallace Going, DO;  Location: [place];  Service: Plastics;  Laterality: Left;  . BREAST SURGERY     right breast excisional biopsy  . DILATATION & CURETTAGE/HYSTEROSCOPY WITH MYOSURE  N/A 12/20/2020   Procedure: DILATATION & CURETTAGE/HYSTEROSCOPY WITH  MYOSURE;  Surgeon: Brien Few, MD;  Location: [place];  Service: Gynecology;  Laterality: N/A;  . DILATION AND CURETTAGE OF UTERUS    . HYSTEROSCOPY WITH D & C N/A 09/20/2015   Procedure: DILATATION AND CURETTAGE /HYSTEROSCOPY;  Surgeon: Brien Few, MD;  Location: [facility];  Service: Gynecology;  Laterality: N/A;  . LIPOSUCTION Bilateral 08/08/2014   Procedure: LIPO SUCTION ;  Surgeon: Theodoro Kos, DO;  Location: [place];  Service: Plastics;  Laterality: Bilateral;  . MASTECTOMY Right 05/16/2014   placement of acellular dermal matrix & tissue expanders   . MASTOPEXY Left 08/08/2014   Procedure:  MASTOPEXY FOR SYMMETRY;  Surgeon: Theodoro Kos, DO;  Location: [place];  Service: Plastics;  Laterality: Left;  . NIPPLE SPARING MASTECTOMY WITH SENTINEL LYMPH NODE BIOPSY Left 09/27/2019   Procedure: LEFT NIPPLE SPARING MASTECTOMY WITH LEFT AXILLARY SENTINEL LYMPH NODE BIOPSY;  Surgeon: Rolm Bookbinder, MD;  Location: [place];  Service: General;  Laterality: Left;  . REDUCTION MAMMAPLASTY Left    2015  . REMOVAL OF TISSUE EXPANDER AND PLACEMENT OF IMPLANT Right 08/08/2014   Procedure: REMOVAL OF RIGHT  TISSUE EXPANDERS WITH PLACEMENT OF RIGHT BREAST IMPLANTS WITH LIPO SUCTION ;  Surgeon: Theodoro Kos, DO;  Location: [place];  Service: Plastics;  Laterality: Right;  . SCAR REVISION Left 11/13/2019   Procedure: EXCISION OF LEFT MASTECTOMY SKIN;  Surgeon: Rolm Bookbinder, MD;  Location: [place];  Service: General;  Laterality: Left;  . scoliosis  1972  harrington rods-age 25  . TONSILLECTOMY  as child    FAMILY HISTORY Family History  Problem Relation Age of Onset  . Heart disease Mother   . Cancer Father        liver  . Colon cancer Father 69  . Head & neck cancer Father        oral cancer - dx in 54s-50s   . Breast cancer Paternal Aunt        dx <50  . Brain cancer Brother        possible meningioma  . Cancer Maternal Aunt        liver/lung cancer  . Stomach cancer Paternal Uncle   . Heart disease Maternal Grandmother   . Lymphoma Maternal Grandfather        NHL  . Heart attack Paternal Grandfather   . Cancer Paternal Uncle        NOS   there is significant cancer history on the paternal side, her father dying at the age of 80 with what the patient tells me was metastatic head and neck cancer (but also involving the colon and liver). One of the patient's father's brothers had stomach cancer, and another had a brain tumor, and 2 of the patient's father's sisters had breast cancer, 1 diagnosed at age 53, the other at age 61.  On the mother's side there is a history of non-Hodgkin's lymphoma at age 38. There is no history of ovarian cancer in the family   GYNECOLOGIC HISTORY:  Patient's last menstrual period was 10/16/2013.  menarche age 43, first live birth age 84. The patient is GX P3. She stopped having periods in October 2014. She did not take hormone replacement. She did take birth control pills for approximately 1 year remotely, with no complications.   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 She works as an Web designer. Son Natasha Graham drives a truck for the family business. Daughter Natasha Graham is a Forensic psychologist and currently works as an IT trainer in [place].  Daughter Natasha Graham is studying history and English at [employer]. The patient has 2 granddaughters aged 90 and 1 as of May 2021. She attends [employer]    ADVANCED DIRECTIVES: In the absence of any documents to the contrary the patient's husband is her healthcare power of attorney   HEALTH MAINTENANCE: Social History   Tobacco Use  . Smoking status: Never Smoker  . Smokeless tobacco: Never Used  Vaping Use  . Vaping Use:  Never used  Substance Use Topics  . Alcohol use: No  . Drug use: No     Colonoscopy:  2013  PAP: March 2015  Bone density:  Lipid panel:  No Known Allergies  Current Outpatient Medications  Medication Sig Dispense Refill  . acetaminophen (TYLENOL) 500 MG tablet Take 500 mg by mouth.    . Ascorbic Acid (VITAMIN C) 100 MG tablet Take 100 mg by mouth daily. 1000 mg    . denosumab (XGEVA) 120 MG/1.7ML SOLN injection Inject 120 mg into the skin once. Monthly    . Flaxseed Oil (LINSEED OIL) OIL by Misc.(Non-Drug; Combo Route) route.    . Fulvestrant (FASLODEX IM) Inject into the muscle. Shot every 4 weeks.    . Ginger, Zingiber officinalis, (GINGER PO) Take by mouth.    Marland Kitchen ibuprofen (ADVIL) 200 MG tablet Take by mouth. Takes 2 of 200 mg    . lisinopril-hydrochlorothiazide (ZESTORETIC)  20-25 MG tablet Take 1 tablet by mouth daily. Pt states lisinopril increased to 30 mg on 12-12-2020    . Magnesium 250 MG TABS Take by mouth.    . Multiple Vitamin (MULTIVITAMIN) capsule Take by mouth.    Marland Kitchen OVER THE COUNTER MEDICATION Calcium 1200mg -Take daily.    Marland Kitchen OVER THE COUNTER MEDICATION Trubiotics-Take daily.    . palbociclib (IBRANCE) 100 MG tablet TAKE 1 TABLET (100 MG TOTAL) BY MOUTH DAILY. TAKE FOR 21 DAYS ON, 7 DAYS OFF, REPEAT EVERY 28 DAYS. START WITH MARCH 2022 CYCLE (Patient taking differently: at bedtime.) 21 tablet 6  . traMADol (ULTRAM) 50 MG tablet Take 1-2 tablets (50-100 mg total) by mouth every 6 (six) hours as needed. 20 tablet 0  . Turmeric 500 MG CAPS Take by mouth.     No current facility-administered medications for this visit.    OBJECTIVE: White woman who appears well Vitals:   12/30/20 0817  BP: (!) 148/71  Pulse: (!) 102  Resp: 18  Temp: 98.1 F (36.7 C)  SpO2: 100%     Body mass index is 29.35 kg/m.    ECOG FS:1 - Symptomatic but completely ambulatory  Sclerae unicteric, EOMs intact Wearing a mask No cervical or supraclavicular adenopathy Lungs no rales or  rhonchi Heart regular rate and rhythm Abd soft, nontender, positive bowel sounds MSK no focal spinal tenderness, no upper extremity lymphedema Neuro: nonfocal, well oriented, appropriate affect Breasts: She is status post bilateral mastectomies.  There is no evidence of chest wall recurrence.  Both axillae are benign.   LAB RESULTS:  CMP     Component Value Date/Time   NA 141 12/20/2020 0745   NA 143 07/30/2014 1603   K 4.1 12/20/2020 0745   K 3.5 07/30/2014 1603   CL 100 12/20/2020 0745   CO2 28 12/02/2020 0803   CO2 30 (H) 07/30/2014 1603   GLUCOSE 117 (H) 12/20/2020 0745   GLUCOSE 93 07/30/2014 1603   BUN 21 12/20/2020 0745   BUN 10.5 07/30/2014 1603   CREATININE 0.80 12/20/2020 0745   CREATININE 0.9 07/30/2014 1603   CALCIUM 9.2 12/02/2020 0803   CALCIUM 9.5 07/30/2014 1603   PROT 7.0 12/02/2020 0803   PROT 7.0 07/30/2014 1603   ALBUMIN 4.2 12/02/2020 0803   ALBUMIN 4.0 07/30/2014 1603   AST 20 12/02/2020 0803   AST 23 07/30/2014 1603   ALT 23 12/02/2020 0803   ALT 23 07/30/2014 1603   ALKPHOS 51 12/02/2020 0803   ALKPHOS 78 07/30/2014 1603   BILITOT 0.5 12/02/2020 0803   BILITOT 0.29 07/30/2014 1603   GFRNONAA >60 12/02/2020 0803   GFRAA >60 04/29/2020 0829    INo results found for: SPEP, UPEP  Lab Results  Component Value Date   WBC 2.0 (L) 12/30/2020   NEUTROABS 1.0 (L) 12/30/2020   HGB 11.8 (L) 12/30/2020   HCT 33.3 (L) 12/30/2020   MCV 105.7 (H) 12/30/2020   PLT 220 12/30/2020      Chemistry      Component Value Date/Time   NA 141 12/20/2020 0745   NA 143 07/30/2014 1603   K 4.1 12/20/2020 0745   K 3.5 07/30/2014 1603   CL 100 12/20/2020 0745   CO2 28 12/02/2020 0803   CO2 30 (H) 07/30/2014 1603   BUN 21 12/20/2020 0745   BUN 10.5 07/30/2014 1603   CREATININE 0.80 12/20/2020 0745   CREATININE 0.9 07/30/2014 1603      Component Value Date/Time   CALCIUM  9.2 12/02/2020 0803   CALCIUM 9.5 07/30/2014 1603   ALKPHOS 51 12/02/2020 0803    ALKPHOS 78 07/30/2014 1603   AST 20 12/02/2020 0803   AST 23 07/30/2014 1603   ALT 23 12/02/2020 0803   ALT 23 07/30/2014 1603   BILITOT 0.5 12/02/2020 0803   BILITOT 0.29 07/30/2014 1603      No results found for: LABCA2  No components found for: LABCA125  No results for input(s): INR in the last 168 hours.  Urinalysis No results found for: COLORURINE, APPEARANCEUR, LABSPEC, PHURINE, GLUCOSEU, HGBUR, BILIRUBINUR, KETONESUR, PROTEINUR, UROBILINOGEN, NITRITE, LEUKOCYTESUR   STUDIES: Most recent staging studies included a bone scan 10/03/2020 showing diffuse bone involvement, CT chest and neck 08/02/2020 showing radiation changes in the lung and a <0.5 cm nodulre requiring f/u, and improvement in the left cervical nodes by soft-tissue CT of the neck same date   ASSESSMENT: 60 y.o. [place], [place] woman  RIGHT BREAST CANCER (1) status post right breast upper-outer quadrant lumpectomy 07/30/2006 showing atypical ductal hyperplasia.  (2) right breast upper outer quadrant biopsy 11/20/2013 showed atypical ductal hyperplasia   (3) right lumpectomy 02/26/2014 showed ductal carcinoma in situ measuring 0.7 cm, estrogen receptor 100% positive, progesterone receptor 96% positive, with 1 mm margins  (4) right breast lower inner quadrant biopsy 03/29/2014 showed ductal carcinoma in situ, 100% estrogen receptor positive, 100% progesterone receptor positive,  (5) status post right mastectomy with sentinel lymph node sampling showing only atypical ductal hyperplasia    (a) five sentinel lymph nodes removed, all clear  (b) status post right saline implant reconstruction  (6) the patient opted against prophylactic antiestrogens   LEFT BREAST CANCER (7) status post left breast lower outer quadrant biopsy 07/12/2019 for a clinical T1N0, stage IA invasive lobular carcinoma, grade 1, with evidence of perineural invasion, estrogen receptor moderately positive, progesterone receptor negative, HER-2  not amplified, with an MIB-1 of less than 1%  (8) status post left nipple sparing mastectomy 09/27/2019 for a pT3 pN1, stage IIIA invasive lobular carcinoma, grade 2, with a negative nipple biopsy and a focally positive anterior margin  (a) 1 sentinel lymph node had a 2.1 cm tumor deposit; a second positive "lymph node" had no lymph node tissue  (b) immediate expander placement  (c) status post left axillary lymph node dissection and margin excision 11/13/2019, no residual disease   (i) 7 of 7 left axillary lymph nodes negative for carcinoma (total 8 nodes removed)  (d) repeat prognostic panel again estrogen receptor 80% positive with moderate staining, progesterone receptor negative    (9) MammaPrint "low risk" predicts no significant benefit from chemotherapy.   (10) adjuvant radiation 12/26/2019 - 02/06/2020 Site Technique Total Dose (Gy) Dose per Fx (Gy) Completed Fx Beam Energies  Chest Wall, Left: CW_Lt_Bst Electron 10/10 2 5/5 6E  Sclav-LT: SCV_Lt Complex 50/50 2 25/25 6X, 15X  Sclav-LT: SCV_Lt_Bst Complex 8/8 2 4/4 6X, 15X  Chest Wall, Left: CW_Lt 3D 50/50 2 25/25 6X, 10X, 15X   (11) staging studies:   (a) chest, abdomen and pelvic CT with contrast 10/31/2019 shows mild hepatic steatosis, small uterine fibroids, 0.3 cm left upper lobe nodule, but no evidence of metastatic disease.  (b) F18 estradiol PET scan 11/21/2019 shows a chain of very small lymph nodes from the subpectoralis nodal station to the left supraclavicular nodal station positive for ER accumulation with SUVs in the 4 range (versus mediastinal blood pool 1.4). no evidence of lung, bone, or liver involvement.   (12) tamoxifen  started 02/29/2020, discontinued DEC 2021 with recurrence  (13) genetics testing 06/27/2020 through the CancerNext-Expanded gene panel offered by [facility] found no deleterious mutations in AIP, ALK, APC*, ATM*, AXIN2, BAP1, BARD1, BLM, BMPR1A, BRCA1*, BRCA2*, BRIP1*, CDC73, CDH1*, CDK4, CDKN1B,  CDKN2A, CHEK2*, CTNNA1, DICER1, FANCC, FH, FLCN, GALNT12, KIF1B, LZTR1, MAX, MEN1, MET, MLH1*, MSH2*, MSH3, MSH6*, MUTYH*, NBN, NF1*, NF2, NTHL1, PALB2*, PHOX2B, PMS2*, POT1, PRKAR1A, PTCH1, PTEN*, RAD51C*, RAD51D*, RB1, RECQL, RET, SDHA, SDHAF2, SDHB, SDHC, SDHD, SMAD4, SMARCA4, SMARCB1, SMARCE1, STK11, SUFU, TMEM127, TP53*, TSC1, TSC2, VHL and XRCC2 (sequencing and deletion/duplication); EGFR, EGLN1, HOXB13, KIT, MITF, PDGFRA, POLD1, and POLE (sequencing only); EPCAM and GREM1 (deletion/duplication only). DNA and RNA analyses performed for * genes. The report date is 06/27/2020.  (14) reconstruction in process  METASTATIC DISEASE: December 2021 (15) chest and neck CT with contrast 08/02/2020 shows diffuse bony sclerosis consistent with metastatic disease, no evidence of visceral disease  (a) CA 27-29 on 08/05/2020 showed 98.5  (b) bone marrow biopsy 08/06/2020 showed metastatic carcinoma, estrogen receptor positive (75% moderate/strong), progesterone receptor and HER-2 negative.  (16) denosumab/Xgeva started 08/15/2020  (17) fulvestrant started 08/15/2020  (a) palbociclib started 09/12/2020 at 125 mg per day, 21/7  (b) palbociclib dose to be decreased to 100 mg daily starting with March 2022 cycle   PLAN: Natasha Graham continues to do very well clinically and right now she has no symptoms related to her cancer.  She is tolerating the Ibrance generally well except for her blood counts.  I am going to drop her dose again, this time to 75 mg daily 21 days on and 7 days off.  She understands that we have 1 more dose level namely 75 mg every other day that we could try.  I do become concerned that as we drop the dose the chance of response may also drop and today we began to talk about switching to [facility] and she has a preliminary understanding of the possible toxicities side effects and complications of that agent.  It is possible that after 2 or 3 more months on Ibrance we will make that switch  She  will need a hysterectomy and she is also scheduled for definitive implant placement in late June.  She wonders if both those procedures can be combined and she is going to be asking Dr. Epimenio Sarin and Dr. Tedra Coupe him that question.  We reviewed her CA 27-29 results which showed no definitive trend at this point.  Otherwise she will return to see me in 4 weeks.  She knows to call for any other issue that may develop before then.  Total encounter time 25 minutes.Sarajane Jews C. Kol Consuegra, MD 12/30/2020 8:41 AM  Oncology and Hematology [facility], [place] [address] Tel. [phone]  Joylene Igo [phone]   I, Wilburn Mylar, am acting as scribe for Dr. Sarajane Jews C. Damarri Rampy.  I, Lurline Del MD, have reviewed the above documentation for accuracy and completeness, and I agree with the above.   *Total Encounter Time as defined by the Centers for Medicare and Medicaid Services includes, in addition to the face-to-face time of a patient visit (documented in the note above) non-face-to-face time: obtaining and reviewing outside history, ordering and reviewing medications, tests or procedures, care coordination (communications with other health care professionals or caregivers) and documentation in the medical record.

## 2020-12-30 ENCOUNTER — Inpatient Hospital Stay: Payer: BC Managed Care – PPO

## 2020-12-30 ENCOUNTER — Other Ambulatory Visit (HOSPITAL_COMMUNITY): Payer: Self-pay

## 2020-12-30 ENCOUNTER — Other Ambulatory Visit: Payer: Self-pay

## 2020-12-30 ENCOUNTER — Inpatient Hospital Stay: Payer: BC Managed Care – PPO | Attending: Oncology

## 2020-12-30 ENCOUNTER — Inpatient Hospital Stay (HOSPITAL_BASED_OUTPATIENT_CLINIC_OR_DEPARTMENT_OTHER): Payer: BC Managed Care – PPO | Admitting: Oncology

## 2020-12-30 VITALS — BP 160/74 | HR 93 | Temp 98.5°F | Resp 18

## 2020-12-30 VITALS — BP 148/71 | HR 102 | Temp 98.1°F | Resp 18 | Ht 64.0 in | Wt 171.0 lb

## 2020-12-30 DIAGNOSIS — Z170 Estrogen receptor positive status [ER+]: Secondary | ICD-10-CM | POA: Insufficient documentation

## 2020-12-30 DIAGNOSIS — C50812 Malignant neoplasm of overlapping sites of left female breast: Secondary | ICD-10-CM | POA: Insufficient documentation

## 2020-12-30 DIAGNOSIS — Z853 Personal history of malignant neoplasm of breast: Secondary | ICD-10-CM | POA: Insufficient documentation

## 2020-12-30 DIAGNOSIS — C50512 Malignant neoplasm of lower-outer quadrant of left female breast: Secondary | ICD-10-CM

## 2020-12-30 DIAGNOSIS — C7951 Secondary malignant neoplasm of bone: Secondary | ICD-10-CM

## 2020-12-30 DIAGNOSIS — C50912 Malignant neoplasm of unspecified site of left female breast: Secondary | ICD-10-CM

## 2020-12-30 DIAGNOSIS — K760 Fatty (change of) liver, not elsewhere classified: Secondary | ICD-10-CM

## 2020-12-30 DIAGNOSIS — Z79899 Other long term (current) drug therapy: Secondary | ICD-10-CM | POA: Diagnosis not present

## 2020-12-30 DIAGNOSIS — I700 Atherosclerosis of aorta: Secondary | ICD-10-CM

## 2020-12-30 LAB — CBC WITH DIFFERENTIAL/PLATELET
Abs Immature Granulocytes: 0.01 10*3/uL (ref 0.00–0.07)
Basophils Absolute: 0.1 10*3/uL (ref 0.0–0.1)
Basophils Relative: 3 %
Eosinophils Absolute: 0 10*3/uL (ref 0.0–0.5)
Eosinophils Relative: 2 %
HCT: 33.3 % — ABNORMAL LOW (ref 36.0–46.0)
Hemoglobin: 11.8 g/dL — ABNORMAL LOW (ref 12.0–15.0)
Immature Granulocytes: 1 %
Lymphocytes Relative: 38 %
Lymphs Abs: 0.8 10*3/uL (ref 0.7–4.0)
MCH: 37.5 pg — ABNORMAL HIGH (ref 26.0–34.0)
MCHC: 35.4 g/dL (ref 30.0–36.0)
MCV: 105.7 fL — ABNORMAL HIGH (ref 80.0–100.0)
Monocytes Absolute: 0.2 10*3/uL (ref 0.1–1.0)
Monocytes Relative: 10 %
Neutro Abs: 1 10*3/uL — ABNORMAL LOW (ref 1.7–7.7)
Neutrophils Relative %: 46 %
Platelets: 220 10*3/uL (ref 150–400)
RBC: 3.15 MIL/uL — ABNORMAL LOW (ref 3.87–5.11)
RDW: 12.7 % (ref 11.5–15.5)
WBC: 2 10*3/uL — ABNORMAL LOW (ref 4.0–10.5)
nRBC: 0 % (ref 0.0–0.2)

## 2020-12-30 LAB — COMPREHENSIVE METABOLIC PANEL
ALT: 16 U/L (ref 0–44)
AST: 17 U/L (ref 15–41)
Albumin: 4 g/dL (ref 3.5–5.0)
Alkaline Phosphatase: 46 U/L (ref 38–126)
Anion gap: 11 (ref 5–15)
BUN: 11 mg/dL (ref 8–23)
CO2: 29 mmol/L (ref 22–32)
Calcium: 9.6 mg/dL (ref 8.9–10.3)
Chloride: 103 mmol/L (ref 98–111)
Creatinine, Ser: 0.81 mg/dL (ref 0.44–1.00)
GFR, Estimated: >60 mL/min (ref >60)
Glucose, Bld: 101 mg/dL — ABNORMAL HIGH (ref 70–99)
Potassium: 3.8 mmol/L (ref 3.5–5.1)
Sodium: 143 mmol/L (ref 135–145)
Total Bilirubin: 0.4 mg/dL (ref 0.3–1.2)
Total Protein: 6.7 g/dL (ref 6.5–8.1)

## 2020-12-30 MED ORDER — FULVESTRANT 250 MG/5ML IM SOLN
INTRAMUSCULAR | Status: AC
  Filled 2020-12-30: qty 10

## 2020-12-30 MED ORDER — DENOSUMAB 120 MG/1.7ML ~~LOC~~ SOLN
120.0000 mg | Freq: Once | SUBCUTANEOUS | Status: AC
  Administered 2020-12-30: 120 mg via SUBCUTANEOUS

## 2020-12-30 MED ORDER — FULVESTRANT 250 MG/5ML IM SOLN
500.0000 mg | Freq: Once | INTRAMUSCULAR | Status: AC
  Administered 2020-12-30: 500 mg via INTRAMUSCULAR

## 2020-12-30 MED ORDER — PALBOCICLIB 75 MG PO TABS
75.0000 mg | ORAL_TABLET | Freq: Every day | ORAL | 6 refills | Status: DC
  Filled 2020-12-30: qty 21, 28d supply, fill #0
  Filled 2021-01-23: qty 21, 28d supply, fill #1

## 2020-12-30 MED ORDER — DENOSUMAB 120 MG/1.7ML ~~LOC~~ SOLN
SUBCUTANEOUS | Status: AC
  Filled 2020-12-30: qty 1.7

## 2020-12-30 NOTE — Patient Instructions (Signed)
Denosumab injection What is this medicine? DENOSUMAB (den oh sue mab) slows bone breakdown. Prolia is used to treat osteoporosis in women after menopause and in men, and in people who are taking corticosteroids for 6 months or more. Xgeva is used to treat a high calcium level due to cancer and to prevent bone fractures and other bone problems caused by multiple myeloma or cancer bone metastases. Xgeva is also used to treat giant cell tumor of the bone. This medicine may be used for other purposes; ask your health care provider or pharmacist if you have questions. COMMON BRAND NAME(S): Prolia, XGEVA What should I tell my health care provider before I take this medicine? They need to know if you have any of these conditions:  dental disease  having surgery or tooth extraction  infection  kidney disease  low levels of calcium or Vitamin D in the blood  malnutrition  on hemodialysis  skin conditions or sensitivity  thyroid or parathyroid disease  an unusual reaction to denosumab, other medicines, foods, dyes, or preservatives  pregnant or trying to get pregnant  breast-feeding How should I use this medicine? This medicine is for injection under the skin. It is given by a health care professional in a hospital or clinic setting. A special MedGuide will be given to you before each treatment. Be sure to read this information carefully each time. For Prolia, talk to your pediatrician regarding the use of this medicine in children. Special care may be needed. For Xgeva, talk to your pediatrician regarding the use of this medicine in children. While this drug may be prescribed for children as young as 13 years for selected conditions, precautions do apply. Overdosage: If you think you have taken too much of this medicine contact a poison control center or emergency room at once. NOTE: This medicine is only for you. Do not share this medicine with others. What if I miss a dose? It is  important not to miss your dose. Call your doctor or health care professional if you are unable to keep an appointment. What may interact with this medicine? Do not take this medicine with any of the following medications:  other medicines containing denosumab This medicine may also interact with the following medications:  medicines that lower your chance of fighting infection  steroid medicines like prednisone or cortisone This list may not describe all possible interactions. Give your health care provider a list of all the medicines, herbs, non-prescription drugs, or dietary supplements you use. Also tell them if you smoke, drink alcohol, or use illegal drugs. Some items may interact with your medicine. What should I watch for while using this medicine? Visit your doctor or health care professional for regular checks on your progress. Your doctor or health care professional may order blood tests and other tests to see how you are doing. Call your doctor or health care professional for advice if you get a fever, chills or sore throat, or other symptoms of a cold or flu. Do not treat yourself. This drug may decrease your body's ability to fight infection. Try to avoid being around people who are sick. You should make sure you get enough calcium and vitamin D while you are taking this medicine, unless your doctor tells you not to. Discuss the foods you eat and the vitamins you take with your health care professional. See your dentist regularly. Brush and floss your teeth as directed. Before you have any dental work done, tell your dentist you are   receiving this medicine. Do not become pregnant while taking this medicine or for 5 months after stopping it. Talk with your doctor or health care professional about your birth control options while taking this medicine. Women should inform their doctor if they wish to become pregnant or think they might be pregnant. There is a potential for serious side  effects to an unborn child. Talk to your health care professional or pharmacist for more information. What side effects may I notice from receiving this medicine? Side effects that you should report to your doctor or health care professional as soon as possible:  allergic reactions like skin rash, itching or hives, swelling of the face, lips, or tongue  bone pain  breathing problems  dizziness  jaw pain, especially after dental work  redness, blistering, peeling of the skin  signs and symptoms of infection like fever or chills; cough; sore throat; pain or trouble passing urine  signs of low calcium like fast heartbeat, muscle cramps or muscle pain; pain, tingling, numbness in the hands or feet; seizures  unusual bleeding or bruising  unusually weak or tired Side effects that usually do not require medical attention (report to your doctor or health care professional if they continue or are bothersome):  constipation  diarrhea  headache  joint pain  loss of appetite  muscle pain  runny nose  tiredness  upset stomach This list may not describe all possible side effects. Call your doctor for medical advice about side effects. You may report side effects to FDA at 1-800-FDA-1088. Where should I keep my medicine? This medicine is only given in a clinic, doctor's office, or other health care setting and will not be stored at home. NOTE: This sheet is a summary. It may not cover all possible information. If you have questions about this medicine, talk to your doctor, pharmacist, or health care provider.  2021 [employer] (2017-12-24 16:10:44) Fulvestrant injection What is this medicine? FULVESTRANT (ful VES trant) blocks the effects of estrogen. It is used to treat breast cancer. This medicine may be used for other purposes; ask your health care provider or pharmacist if you have questions. COMMON BRAND NAME(S): FASLODEX What should I tell my health care  provider before I take this medicine? They need to know if you have any of these conditions:  bleeding disorders  liver disease  low blood counts, like low white cell, platelet, or red cell counts  an unusual or allergic reaction to fulvestrant, other medicines, foods, dyes, or preservatives  pregnant or trying to get pregnant  breast-feeding How should I use this medicine? This medicine is for injection into a muscle. It is usually given by a health care professional in a hospital or clinic setting. Talk to your pediatrician regarding the use of this medicine in children. Special care may be needed. Overdosage: If you think you have taken too much of this medicine contact a poison control center or emergency room at once. NOTE: This medicine is only for you. Do not share this medicine with others. What if I miss a dose? It is important not to miss your dose. Call your doctor or health care professional if you are unable to keep an appointment. What may interact with this medicine?  medicines that treat or prevent blood clots like warfarin, enoxaparin, dalteparin, apixaban, dabigatran, and rivaroxaban This list may not describe all possible interactions. Give your health care provider a list of all the medicines, herbs, non-prescription drugs, or dietary supplements you use.   Also tell them if you smoke, drink alcohol, or use illegal drugs. Some items may interact with your medicine. What should I watch for while using this medicine? Your condition will be monitored carefully while you are receiving this medicine. You will need important blood work done while you are taking this medicine. Do not become pregnant while taking this medicine or for at least 1 year after stopping it. Women of child-bearing potential will need to have a negative pregnancy test before starting this medicine. Women should inform their doctor if they wish to become pregnant or think they might be pregnant. There is  a potential for serious side effects to an unborn child. Men should inform their doctors if they wish to father a child. This medicine may lower sperm counts. Talk to your health care professional or pharmacist for more information. Do not breast-feed an infant while taking this medicine or for 1 year after the last dose. What side effects may I notice from receiving this medicine? Side effects that you should report to your doctor or health care professional as soon as possible:  allergic reactions like skin rash, itching or hives, swelling of the face, lips, or tongue  feeling faint or lightheaded, falls  pain, tingling, numbness, or weakness in the legs  signs and symptoms of infection like fever or chills; cough; flu-like symptoms; sore throat  vaginal bleeding Side effects that usually do not require medical attention (report to your doctor or health care professional if they continue or are bothersome):  aches, pains  constipation  diarrhea  headache  hot flashes  nausea, vomiting  pain at site where injected  stomach pain This list may not describe all possible side effects. Call your doctor for medical advice about side effects. You may report side effects to FDA at 1-800-FDA-1088. Where should I keep my medicine? This drug is given in a hospital or clinic and will not be stored at home. NOTE: This sheet is a summary. It may not cover all possible information. If you have questions about this medicine, talk to your doctor, pharmacist, or health care provider.  2021 [employer] Standard (2017-11-25 11:34:41)  

## 2020-12-31 LAB — CANCER ANTIGEN 27.29: CA 27.29: 77.8 U/mL — ABNORMAL HIGH (ref 0.0–38.6)

## 2021-01-03 ENCOUNTER — Other Ambulatory Visit (HOSPITAL_COMMUNITY): Payer: Self-pay

## 2021-01-16 ENCOUNTER — Other Ambulatory Visit: Payer: Self-pay | Admitting: Obstetrics and Gynecology

## 2021-01-16 DIAGNOSIS — N950 Postmenopausal bleeding: Secondary | ICD-10-CM | POA: Diagnosis not present

## 2021-01-16 DIAGNOSIS — N8501 Benign endometrial hyperplasia: Secondary | ICD-10-CM | POA: Diagnosis not present

## 2021-01-16 DIAGNOSIS — Z9889 Other specified postprocedural states: Secondary | ICD-10-CM | POA: Diagnosis not present

## 2021-01-23 ENCOUNTER — Other Ambulatory Visit (HOSPITAL_COMMUNITY): Payer: Self-pay

## 2021-01-29 ENCOUNTER — Other Ambulatory Visit (HOSPITAL_COMMUNITY): Payer: Self-pay

## 2021-01-29 ENCOUNTER — Inpatient Hospital Stay: Payer: BC Managed Care – PPO | Attending: Oncology | Admitting: Oncology

## 2021-01-29 ENCOUNTER — Other Ambulatory Visit: Payer: Self-pay

## 2021-01-29 ENCOUNTER — Other Ambulatory Visit: Payer: Self-pay | Admitting: *Deleted

## 2021-01-29 ENCOUNTER — Encounter (HOSPITAL_BASED_OUTPATIENT_CLINIC_OR_DEPARTMENT_OTHER): Payer: Self-pay | Admitting: Obstetrics and Gynecology

## 2021-01-29 ENCOUNTER — Inpatient Hospital Stay: Payer: BC Managed Care – PPO

## 2021-01-29 VITALS — BP 152/69 | HR 87 | Temp 97.3°F | Resp 20 | Ht 64.0 in | Wt 170.4 lb

## 2021-01-29 DIAGNOSIS — I700 Atherosclerosis of aorta: Secondary | ICD-10-CM

## 2021-01-29 DIAGNOSIS — C50512 Malignant neoplasm of lower-outer quadrant of left female breast: Secondary | ICD-10-CM

## 2021-01-29 DIAGNOSIS — C50812 Malignant neoplasm of overlapping sites of left female breast: Secondary | ICD-10-CM | POA: Insufficient documentation

## 2021-01-29 DIAGNOSIS — C7951 Secondary malignant neoplasm of bone: Secondary | ICD-10-CM

## 2021-01-29 DIAGNOSIS — Z20822 Contact with and (suspected) exposure to covid-19: Secondary | ICD-10-CM | POA: Diagnosis not present

## 2021-01-29 DIAGNOSIS — Z170 Estrogen receptor positive status [ER+]: Secondary | ICD-10-CM

## 2021-01-29 DIAGNOSIS — Z923 Personal history of irradiation: Secondary | ICD-10-CM | POA: Diagnosis not present

## 2021-01-29 DIAGNOSIS — Z79899 Other long term (current) drug therapy: Secondary | ICD-10-CM | POA: Diagnosis not present

## 2021-01-29 DIAGNOSIS — C50912 Malignant neoplasm of unspecified site of left female breast: Secondary | ICD-10-CM

## 2021-01-29 DIAGNOSIS — K760 Fatty (change of) liver, not elsewhere classified: Secondary | ICD-10-CM

## 2021-01-29 DIAGNOSIS — Z9013 Acquired absence of bilateral breasts and nipples: Secondary | ICD-10-CM | POA: Insufficient documentation

## 2021-01-29 DIAGNOSIS — Z01812 Encounter for preprocedural laboratory examination: Secondary | ICD-10-CM | POA: Diagnosis not present

## 2021-01-29 LAB — CBC WITH DIFFERENTIAL/PLATELET
Abs Immature Granulocytes: 0.01 10*3/uL (ref 0.00–0.07)
Basophils Absolute: 0.1 10*3/uL (ref 0.0–0.1)
Basophils Relative: 2 %
Eosinophils Absolute: 0 10*3/uL (ref 0.0–0.5)
Eosinophils Relative: 2 %
HCT: 32.8 % — ABNORMAL LOW (ref 36.0–46.0)
Hemoglobin: 12.2 g/dL (ref 12.0–15.0)
Immature Granulocytes: 0 %
Lymphocytes Relative: 36 %
Lymphs Abs: 1 10*3/uL (ref 0.7–4.0)
MCH: 37.8 pg — ABNORMAL HIGH (ref 26.0–34.0)
MCHC: 37.2 g/dL — ABNORMAL HIGH (ref 30.0–36.0)
MCV: 101.5 fL — ABNORMAL HIGH (ref 80.0–100.0)
Monocytes Absolute: 0.3 10*3/uL (ref 0.1–1.0)
Monocytes Relative: 11 %
Neutro Abs: 1.3 10*3/uL — ABNORMAL LOW (ref 1.7–7.7)
Neutrophils Relative %: 49 %
Platelets: 217 10*3/uL (ref 150–400)
RBC: 3.23 MIL/uL — ABNORMAL LOW (ref 3.87–5.11)
RDW: 11.8 % (ref 11.5–15.5)
WBC: 2.7 10*3/uL — ABNORMAL LOW (ref 4.0–10.5)
nRBC: 0 % (ref 0.0–0.2)

## 2021-01-29 LAB — COMPREHENSIVE METABOLIC PANEL
ALT: 19 U/L (ref 0–44)
AST: 17 U/L (ref 15–41)
Albumin: 3.9 g/dL (ref 3.5–5.0)
Alkaline Phosphatase: 46 U/L (ref 38–126)
Anion gap: 14 (ref 5–15)
BUN: 14 mg/dL (ref 8–23)
CO2: 26 mmol/L (ref 22–32)
Calcium: 9.4 mg/dL (ref 8.9–10.3)
Chloride: 102 mmol/L (ref 98–111)
Creatinine, Ser: 0.78 mg/dL (ref 0.44–1.00)
GFR, Estimated: >60 mL/min (ref >60)
Glucose, Bld: 165 mg/dL — ABNORMAL HIGH (ref 70–99)
Potassium: 3.3 mmol/L — ABNORMAL LOW (ref 3.5–5.1)
Sodium: 142 mmol/L (ref 135–145)
Total Bilirubin: 0.5 mg/dL (ref 0.3–1.2)
Total Protein: 6.7 g/dL (ref 6.5–8.1)

## 2021-01-29 MED ORDER — FULVESTRANT 250 MG/5ML IM SOLN
INTRAMUSCULAR | Status: AC
  Filled 2021-01-29: qty 10

## 2021-01-29 MED ORDER — DENOSUMAB 120 MG/1.7ML ~~LOC~~ SOLN
120.0000 mg | Freq: Once | SUBCUTANEOUS | Status: AC
Start: 2021-01-29 — End: 2021-01-29
  Administered 2021-01-29: 120 mg via SUBCUTANEOUS

## 2021-01-29 MED ORDER — FULVESTRANT 250 MG/5ML IM SOLN
500.0000 mg | Freq: Once | INTRAMUSCULAR | Status: AC
  Administered 2021-01-29: 500 mg via INTRAMUSCULAR

## 2021-01-29 MED ORDER — DENOSUMAB 120 MG/1.7ML ~~LOC~~ SOLN
SUBCUTANEOUS | Status: AC
  Filled 2021-01-29: qty 1.7

## 2021-01-29 NOTE — Progress Notes (Signed)
[place]  Telephone:([phone]) [phone] Fax:(336) [phone]     ID: Natasha Graham DOB: Jan 30, 1961  MR#: [id number]  XTK#:[id number]  Patient Care Team: Natasha Sake, MD as PCP - General (Family Medicine) Natasha Bookbinder, MD as Consulting Physician (General Surgery) Natasha Graham, Natasha Dad, MD as Consulting Physician (Oncology) Natasha Kaufmann, RN as Registered Nurse Natasha Germany, RN as Registered Nurse Natasha Graham, Natasha Lofty, DO as Attending Physician (Plastic Surgery) Natasha Pray, MD as Consulting Physician (Radiation Oncology) Natasha Few, MD as Consulting Physician (Obstetrics and Gynecology) OTHER MD: Natasha Mustache MD   CHIEF COMPLAINT: new estrogen receptor positive stage IV breast cancer; remote noninvasive right breast cancer (s/p bilateral mastectomies)  CURRENT TREATMENT: Denosumab/Xgeva; fulvestrant; palbociclib/Ibrance   INTERVAL HISTORY: Natasha Graham returns today for follow up and treatment of her newly diagnosed invasive left breast cancer.  She continues on fulvestrant.  She has had no problems from this including no significant discomfort from the actual injections  She also receives denosumab, most recently on 12/02/2020.  She did not experience any bony aches or any symptoms suggestive of hypocalcemia  She started on Ibrance on 09/12/2020.  This dropped her [place], and her dose was decreased to 100 mg on 11/07/2020 and again to 75 mg in May.  This continues 3 weeks on and 1 week off  She is scheduled for hysterectomy with BSO on 02/06/2021 under Dr. Ronita Graham.  She is also scheduled for definitive breast implant placement on 03/19/2021 under Dr. Marla Graham.  We are following her CA 27-29 Results for Natasha Graham (MRN [id number]) as of 01/29/2021 12:35  Ref. Range 09/10/2020 14:39 10/07/2020 12:50 11/04/2020 13:54 12/02/2020 08:03 12/30/2020 07:55  CA 27.29 Latest Ref Range: 0.0 - 38.6 U/mL 101.6 (H) 83.3 (H) 80.8 (H) 87.3 (H) 77.8 (H)   REVIEW OF  SYSTEMS: Natasha Graham did a lot of work this past holiday weekend because it was a time.  She drove the tractor while everybody else helped with the actual pain.  She also takes walks.  She has no problems with nausea secondary to the Ibrance and no unusual fatigue.  Her sugar is a little bit up today she says because she has been eating a lot of strawberries.  A detailed review of systems today was otherwise stable   COVID 19 VACCINATION STATUS: Patient had antibodies to the virus documented November 2020.  She is not planning on vaccinations   LEFT BREAST CANCER HISTORY: From the original intake note:  She had routine screening left mammogram on 06/14/2019 showing a developing asymmetry. She presented for left diagnostic mammogram and left breast ultrasound on 07/07/2019. Physical exam that day showed a palpable, firm, superficial mass in the left breast at 6 o'clock along the site of reduction mammoplasty scar (performed in 2015 with right mastectomy). Scans showed: breast density category B; 2.1 cm superficial mass involving the skin in the left breast at 6 o'clock; no enlarged adenopathy in the left axilla.   Accordingly on 07/12/2019 she proceeded to biopsy of the left breast area in question. The pathology from this procedure ([id number]) showed: invasive lobular carcinoma, grade 1, with perineural invasion present. Prognostic indicators significant for: estrogen receptor, 60% positive with moderate staining intensity and progesterone receptor, 0% negative. Proliferation marker Ki67 at <1%. HER2 equvocal by immunohistochemistry (2+), but negative by fluorescent in situ hybridization with a signals ratio 1.18 and number per cell 1.95.  She opted to proceed with left mastectomy on 09/27/2019 under Dr. Donne Graham with immediate reconstruction under Dr.  Dillingham. Pathology from the procedure ([id number]) revealed: invasive lobular carcinoma, grade 2, 6.2 cm, focally involving anterior margin and  involving skin dermis without involvement of epidermis; lobular carcinoma in situ with pagetoid spread; small focus of low grade ductal carcinoma in situ, 0.2 cm.  Two lymph nodes were biopsied. One showed metastatic lobular carcinoma with focus measuring 2.1 cm with evidence of extranodal extension. The other lymph node also showed metastatic lobular carcinoma with no lymphoid tissue present.  Biopsy of the nipple was also taken at that time and showed no evidence of invasive carcinoma.  Mammaprint was performed on the final surgical sample. This returned showing low risk.  RIGHT BREAST CANCER HISTORY: Natasha Graham has a history of right breast biopsy in late 2007 showing atypical ductal hyperplasia in the upper outer right breast.. Breast MRI 07/13/2006 showed postbiopsy changes in the upper right breast, and some right breast cysts. There were no solid masses however or areas of abnormal enhancement. There were no enlarged lymph nodes. Incidental finding of liver cysts was made. On 07/30/2006 she underwent needle localization of the calcifications in the outer right breast which also showed ([id number]) atypical ductal hyperplasia, with no malignancy.   On 10/21/2012 routine digital bilateral screening mammography at the breast Center showed breast density to be category C. In the right breast there were calcifications and architectural distortion to go with skin retraction. Additional views obtained 11/17/2012 showed extensive but stable punctate microcalcifications in the upper outer right breast. Many of these were superficial and not amenable to stereotactic biopsy. MRI was suggested but the patient was unable to undergo that test and therefore six-month mammography was obtained 05/15/2013. This showed the calcifications in the outer right breast to have been stable. Because of their extents and because of their superficial location they could not be all sampled all removed with biopsy. MRI was again  discussed, but the patient has Harrington rods in place and also felt the cost of MRI was prohibitive.  On 11/13/2013 further six-month follow-up showed a grouping of intermediate microcalcifications in the lateral right breast and these were biopsied 11/20/2013. The result of that procedure ([id number]) showed atypical ductal hyperplasia. Accordingly on 02/23/2014 the patient underwent right lumpectomy, with the pathology ([id number]) showing ductal carcinoma in situ, grade 2, measuring 0.7 cm. Estrogen receptor was 100% positive. Progesterone receptor was 96% positive. Both showed strong staining intensity. Margins were clear but the closest margin anteriorly was at 1 mm.  On 03/07/2014 the patient underwent postoperative mammography which showed post lumpectomy changes but also calcifications extending from the lumpectomy site anteriorly towards the nipple. On 03/12/2014 the patient underwent bilateral breast MRI. Breast composition here was described as category B. In the right breast there was a 4.8 cm fluidlike excisional cavity in the lower outer right breast. Adjacent to this was a focus of non-masslike enhancement measuring 1.9 cm. This is an area associated with the scar from the excision in 2007. There was also non-masslike enhancement inferior to the biopsy cavity measuring 3.8 cm. There were no abnormal lymph nodes and no masses or abnormal enhancement in the left breast. Specifically the left breast lower outer quadrant was unchanged from 2007 MRI.  Overall it was felt the linear non-masslike enhancement extending almost 4 cm inferior to the excisional cavity was sufficiently suspicious to warrant biopsy, and this was performed 03/29/2014. It again showed ductal carcinoma in situ, grade 1, estrogen receptor and progesterone receptor both 100% positive with strong staining intensity.  At this point  the patient met with surgery and opted for definitive right mastectomy with immediate  reconstruction. On 05/16/2014 she underwent simple mastectomy with sentinel lymph node sampling. The pathology ([id number]) showed atypical ductal hyperplasia but no evidence of neoplasia. All 5 sentinel lymph nodes were clear. The patient had immediate right breast reconstruction with tissue expander and dermal matrix placement.  She is scheduled for definitive implant placement 08/08/2014.  Her subsequent history is as detailed below   PAST MEDICAL HISTORY: Past Medical History:  Diagnosis Date  . Cancer [facility])    breast cancer left 2021 roght 2015  . Family history of brain cancer   . Family history of breast cancer   . Family history of colon cancer   . Family history of stomach cancer   . History of radiation therapy last done February 06 2020  . Hypertension   . PMB (postmenopausal bleeding)   . PONV (postoperative nausea and vomiting)    likes scopolamine patch  . Scoliosis   . Wears glasses   . Wears glasses     PAST SURGICAL HISTORY: Past Surgical History:  Procedure Laterality Date  . AUGMENTATION MAMMAPLASTY Right    2015 post mastectomy  . AXILLARY LYMPH NODE DISSECTION Left 11/13/2019   Procedure: LEFT AXILLARY LYMPH NODE DISSECTION;  Surgeon: Natasha Bookbinder, MD;  Location: [place];  Service: General;  Laterality: Left;  . BREAST RECONSTRUCTION WITH PLACEMENT OF TISSUE EXPANDER AND FLEX HD (ACELLULAR HYDRATED DERMIS) Right 05/16/2014   Procedure: IMMEDIATE RIGHT BREAST RECONSTRUCTION WITH PLACEMENT OF TISSUE EXPANDER AND FLEX HD (ACELLULAR HYDRATED DERMIS);  Surgeon: Theodoro Kos, DO;  Location: [place];  Service: Plastics;  Laterality: Right;  . BREAST RECONSTRUCTION WITH PLACEMENT OF TISSUE EXPANDER AND FLEX HD (ACELLULAR HYDRATED DERMIS) Left 09/27/2019   Procedure: LEFT BREAST RECONSTRUCTION WITH PLACEMENT OF TISSUE EXPANDER AND FLEX HD (ACELLULAR HYDRATED DERMIS);  Surgeon: Wallace Going, DO;  Location: [place];  Service:  Plastics;  Laterality: Left;  . BREAST SURGERY     right breast excisional biopsy  . DILATATION & CURETTAGE/HYSTEROSCOPY WITH MYOSURE N/A 12/20/2020   Procedure: DILATATION & CURETTAGE/HYSTEROSCOPY WITH  MYOSURE;  Surgeon: Natasha Few, MD;  Location: [place];  Service: Gynecology;  Laterality: N/A;  . DILATION AND CURETTAGE OF UTERUS    . HYSTEROSCOPY WITH D & C N/A 09/20/2015   Procedure: DILATATION AND CURETTAGE /HYSTEROSCOPY;  Surgeon: Natasha Few, MD;  Location: [facility];  Service: Gynecology;  Laterality: N/A;  . LIPOSUCTION Bilateral 08/08/2014   Procedure: LIPO SUCTION ;  Surgeon: Theodoro Kos, DO;  Location: [place];  Service: Plastics;  Laterality: Bilateral;  . MASTECTOMY Right 05/16/2014   placement of acellular dermal matrix & tissue expanders   . MASTOPEXY Left 08/08/2014   Procedure:  MASTOPEXY FOR SYMMETRY;  Surgeon: Theodoro Kos, DO;  Location: [place];  Service: Plastics;  Laterality: Left;  . NIPPLE SPARING MASTECTOMY WITH SENTINEL LYMPH NODE BIOPSY Left 09/27/2019   Procedure: LEFT NIPPLE SPARING MASTECTOMY WITH LEFT AXILLARY SENTINEL LYMPH NODE BIOPSY;  Surgeon: Natasha Bookbinder, MD;  Location: [place];  Service: General;  Laterality: Left;  . REDUCTION MAMMAPLASTY Left    2015  . REMOVAL OF TISSUE EXPANDER AND PLACEMENT OF IMPLANT Right 08/08/2014   Procedure: REMOVAL OF RIGHT  TISSUE EXPANDERS WITH PLACEMENT OF RIGHT BREAST IMPLANTS WITH LIPO SUCTION ;  Surgeon: Theodoro Kos, DO;  Location: [place];  Service: Plastics;  Laterality:  Right;  Marland Kitchen SCAR REVISION Left 11/13/2019   Procedure: EXCISION OF LEFT MASTECTOMY SKIN;  Surgeon: Natasha Bookbinder, MD;  Location: [place];  Service: General;  Laterality: Left;  . scoliosis  1972   harrington rods-age 26  . TONSILLECTOMY  as child    FAMILY HISTORY Family History  Problem Relation Age of Onset  . Heart  disease Mother   . Cancer Father        liver  . Colon cancer Father 37  . Head & neck cancer Father        oral cancer - dx in 13s-50s  . Breast cancer Paternal Aunt        dx <50  . Brain cancer Brother        possible meningioma  . Cancer Maternal Aunt        liver/lung cancer  . Stomach cancer Paternal Uncle   . Heart disease Maternal Grandmother   . Lymphoma Maternal Grandfather        NHL  . Heart attack Paternal Grandfather   . Cancer Paternal Uncle        NOS   there is significant cancer history on the paternal side, her father dying at the age of 40 with what the patient tells me was metastatic head and neck cancer (but also involving the colon and liver). One of the patient's father's brothers had stomach cancer, and another had a brain tumor, and 2 of the patient's father's sisters had breast cancer, 1 diagnosed at age 54, the other at age 49.  On the mother's side there is a history of non-Hodgkin's lymphoma at age 33. There is no history of ovarian cancer in the family   GYNECOLOGIC HISTORY:  Patient's last menstrual period was 10/16/2013.  menarche age 66, first live birth age 8. The patient is GX P3. She stopped having periods in October 2014. She did not take hormone replacement. She did take birth control pills for approximately 1 year remotely, with no complications.   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 She works as an Web designer. Son Truman Hayward drives a truck for the family business. Daughter Thayer Headings is a Forensic psychologist and currently works as an IT trainer in [place].  Daughter Roselyn Reef is studying history and English at [employer]. The patient has 2 granddaughters aged 33 and 1 as of May 2021. She attends [employer]    ADVANCED DIRECTIVES: In the absence of any documents to the contrary the patient's husband is her healthcare power of attorney   HEALTH  MAINTENANCE: Social History   Tobacco Use  . Smoking status: Never Smoker  . Smokeless tobacco: Never Used  Vaping Use  . Vaping Use: Never used  Substance Use Topics  . Alcohol use: No  . Drug use: No     Colonoscopy:  2013  PAP: March 2015  Bone density:  Lipid panel:  No Known Allergies  Current Outpatient Medications  Medication Sig Dispense Refill  . acetaminophen (TYLENOL) 500 MG tablet Take 500 mg by mouth.    . Ascorbic Acid (VITAMIN C) 100 MG tablet Take 100 mg by mouth daily. 1000 mg    . denosumab (XGEVA) 120 MG/1.7ML SOLN injection Inject 120 mg into the skin once. Monthly    . Flaxseed Oil (LINSEED OIL) OIL by Misc.(Non-Drug; Combo Route) route.    . Fulvestrant (FASLODEX IM) Inject into the muscle.  Shot every 4 weeks.    . Ginger, Zingiber officinalis, (GINGER PO) Take by mouth.    Marland Kitchen ibuprofen (ADVIL) 200 MG tablet Take by mouth. Takes 2 of 200 mg    . lisinopril-hydrochlorothiazide (ZESTORETIC) 20-25 MG tablet Take 1 tablet by mouth daily. Pt states lisinopril increased to 30 mg on 12-12-2020    . Magnesium 250 MG TABS Take by mouth.    . Multiple Vitamin (MULTIVITAMIN) capsule Take by mouth.    Marland Kitchen OVER THE COUNTER MEDICATION Calcium 1253m-Take daily.    .Marland KitchenOVER THE COUNTER MEDICATION Trubiotics-Take daily.    . palbociclib (IBRANCE) 75 MG tablet Take 1 tablet (75 mg total) by mouth daily. Take for 21 days on, 7 days off, repeat every 28 days. 21 tablet 6  . traMADol (ULTRAM) 50 MG tablet Take 1-2 tablets (50-100 mg total) by mouth every 6 (six) hours as needed. 20 tablet 0  . Turmeric 500 MG CAPS Take by mouth.     No current facility-administered medications for this visit.    OBJECTIVE: White woman who appears well Vitals:   01/29/21 1210  BP: (!) 152/69  Pulse: 87  Resp: 20  Temp: (!) 97.3 F (36.3 C)  SpO2: 99%     Body mass index is 29.25 kg/m.    ECOG FS:1 - Symptomatic but completely ambulatory  Sclerae unicteric, EOMs intact Wearing a  mask No cervical or supraclavicular adenopathy Lungs no rales or rhonchi Heart regular rate and rhythm Abd soft, nontender, positive bowel sounds MSK no focal spinal tenderness, no upper extremity lymphedema Neuro: nonfocal, well oriented, appropriate affect Breasts: Status post bilateral mastectomies, with no evidence of chest wall recurrence.  On the left the expander is still in place.  Both axillae are benign.  LAB RESULTS:  CMP     Component Value Date/Time   NA 142 01/29/2021 1139   NA 143 07/30/2014 1603   K 3.3 (L) 01/29/2021 1139   K 3.5 07/30/2014 1603   CL 102 01/29/2021 1139   CO2 26 01/29/2021 1139   CO2 30 (H) 07/30/2014 1603   GLUCOSE 165 (H) 01/29/2021 1139   GLUCOSE 93 07/30/2014 1603   BUN 14 01/29/2021 1139   BUN 10.5 07/30/2014 1603   CREATININE 0.78 01/29/2021 1139   CREATININE 0.9 07/30/2014 1603   CALCIUM 9.4 01/29/2021 1139   CALCIUM 9.5 07/30/2014 1603   PROT 6.7 01/29/2021 1139   PROT 7.0 07/30/2014 1603   ALBUMIN 3.9 01/29/2021 1139   ALBUMIN 4.0 07/30/2014 1603   AST 17 01/29/2021 1139   AST 23 07/30/2014 1603   ALT 19 01/29/2021 1139   ALT 23 07/30/2014 1603   ALKPHOS 46 01/29/2021 1139   ALKPHOS 78 07/30/2014 1603   BILITOT 0.5 01/29/2021 1139   BILITOT 0.29 07/30/2014 1603   GFRNONAA >60 01/29/2021 1139   GFRAA >60 04/29/2020 0829    INo results found for: SPEP, UPEP  Lab Results  Component Value Date   WBC 2.7 (L) 01/29/2021   NEUTROABS 1.3 (L) 01/29/2021   HGB 12.2 01/29/2021   HCT 32.8 (L) 01/29/2021   MCV 101.5 (H) 01/29/2021   PLT 217 01/29/2021      Chemistry      Component Value Date/Time   NA 142 01/29/2021 1139   NA 143 07/30/2014 1603   K 3.3 (L) 01/29/2021 1139   K 3.5 07/30/2014 1603   CL 102 01/29/2021 1139   CO2 26 01/29/2021 1139   CO2 30 (H) 07/30/2014 1603  BUN 14 01/29/2021 1139   BUN 10.5 07/30/2014 1603   CREATININE 0.78 01/29/2021 1139   CREATININE 0.9 07/30/2014 1603      Component Value  Date/Time   CALCIUM 9.4 01/29/2021 1139   CALCIUM 9.5 07/30/2014 1603   ALKPHOS 46 01/29/2021 1139   ALKPHOS 78 07/30/2014 1603   AST 17 01/29/2021 1139   AST 23 07/30/2014 1603   ALT 19 01/29/2021 1139   ALT 23 07/30/2014 1603   BILITOT 0.5 01/29/2021 1139   BILITOT 0.29 07/30/2014 1603      No results found for: LABCA2  No components found for: LABCA125  No results for input(s): INR in the last 168 hours.  Urinalysis No results found for: COLORURINE, APPEARANCEUR, LABSPEC, PHURINE, GLUCOSEU, HGBUR, BILIRUBINUR, KETONESUR, PROTEINUR, UROBILINOGEN, NITRITE, LEUKOCYTESUR   STUDIES: Most recent staging studies included a bone scan 10/03/2020 showing diffuse bone involvement, CT chest and neck 08/02/2020 showing radiation changes in the lung and a <0.5 cm nodulre requiring f/u, and improvement in the left cervical nodes by soft-tissue CT of the neck same date   ASSESSMENT: 60 y.o. [place], [place] woman  RIGHT BREAST CANCER (1) status post right breast upper-outer quadrant lumpectomy 07/30/2006 showing atypical ductal hyperplasia.  (2) right breast upper outer quadrant biopsy 11/20/2013 showed atypical ductal hyperplasia   (3) right lumpectomy 02/26/2014 showed ductal carcinoma in situ measuring 0.7 cm, estrogen receptor 100% positive, progesterone receptor 96% positive, with 1 mm margins  (4) right breast lower inner quadrant biopsy 03/29/2014 showed ductal carcinoma in situ, 100% estrogen receptor positive, 100% progesterone receptor positive,  (5) status post right mastectomy with sentinel lymph node sampling showing only atypical ductal hyperplasia    (a) five sentinel lymph nodes removed, all clear  (b) status post right saline implant reconstruction  (6) the patient opted against prophylactic antiestrogens   LEFT BREAST CANCER (7) status post left breast lower outer quadrant biopsy 07/12/2019 for a clinical T1N0, stage IA invasive lobular carcinoma, grade 1, with  evidence of perineural invasion, estrogen receptor moderately positive, progesterone receptor negative, HER-2 not amplified, with an MIB-1 of less than 1%  (8) status post left nipple sparing mastectomy 09/27/2019 for a pT3 pN1, stage IIIA invasive lobular carcinoma, grade 2, with a negative nipple biopsy and a focally positive anterior margin  (a) 1 sentinel lymph node had a 2.1 cm tumor deposit; a second positive "lymph node" had no lymph node tissue  (b) immediate expander placement with definitive implant placement 03/19/2021  (c) status post left axillary lymph node dissection and margin excision 11/13/2019, no residual disease   (i) 7 of 7 left axillary lymph nodes negative for carcinoma (total 8 nodes removed)  (d) repeat prognostic panel again estrogen receptor 80% positive with moderate staining, progesterone receptor negative    (9) MammaPrint "low risk" predicts no significant benefit from chemotherapy.   (10) adjuvant radiation 12/26/2019 - 02/06/2020 Site Technique Total Dose (Gy) Dose per Fx (Gy) Completed Fx Beam Energies  Chest Wall, Left: CW_Lt_Bst Electron 10/10 2 5/5 6E  Sclav-LT: SCV_Lt Complex 50/50 2 25/25 6X, 15X  Sclav-LT: SCV_Lt_Bst Complex 8/8 2 4/4 6X, 15X  Chest Wall, Left: CW_Lt 3D 50/50 2 25/25 6X, 10X, 15X   (11) staging studies:   (a) chest, abdomen and pelvic CT with contrast 10/31/2019 shows mild hepatic steatosis, small uterine fibroids, 0.3 cm left upper lobe nodule, but no evidence of metastatic disease.  (b) F18 estradiol PET scan 11/21/2019 shows a chain of very small lymph nodes  from the subpectoralis nodal station to the left supraclavicular nodal station positive for ER accumulation with SUVs in the 4 range (versus mediastinal blood pool 1.4). no evidence of lung, bone, or liver involvement.   (12) tamoxifen started 02/29/2020, discontinued DEC 2021 with recurrence  (13) genetics testing 06/27/2020 through the CancerNext-Expanded gene panel offered by  [facility] found no deleterious mutations in AIP, ALK, APC*, ATM*, AXIN2, BAP1, BARD1, BLM, BMPR1A, BRCA1*, BRCA2*, BRIP1*, CDC73, CDH1*, CDK4, CDKN1B, CDKN2A, CHEK2*, CTNNA1, DICER1, FANCC, FH, FLCN, GALNT12, KIF1B, LZTR1, MAX, MEN1, MET, MLH1*, MSH2*, MSH3, MSH6*, MUTYH*, NBN, NF1*, NF2, NTHL1, PALB2*, PHOX2B, PMS2*, POT1, PRKAR1A, PTCH1, PTEN*, RAD51C*, RAD51D*, RB1, RECQL, RET, SDHA, SDHAF2, SDHB, SDHC, SDHD, SMAD4, SMARCA4, SMARCB1, SMARCE1, STK11, SUFU, TMEM127, TP53*, TSC1, TSC2, VHL and XRCC2 (sequencing and deletion/duplication); EGFR, EGLN1, HOXB13, KIT, MITF, PDGFRA, POLD1, and POLE (sequencing only); EPCAM and GREM1 (deletion/duplication only). DNA and RNA analyses performed for * genes. The report date is 06/27/2020.  (14) reconstruction in process  METASTATIC DISEASE: December 2021 (15) chest and neck CT with contrast 08/02/2020 shows diffuse bony sclerosis consistent with metastatic disease, no evidence of visceral disease  (a) CA 27-29 on 08/05/2020 showed 98.5  (b) bone marrow biopsy 08/06/2020 showed metastatic carcinoma, estrogen receptor positive (75% moderate/strong), progesterone receptor and HER-2 negative.  (16) denosumab/Xgeva started 08/15/2020  (17) fulvestrant started 08/15/2020  (a) palbociclib started 09/12/2020 at 125 mg per day, 21/7  (b) palbociclib dose to be decreased to 100 mg daily starting with March 2022 cycle  (c) dose dropped to 75 mg daily, 21/7, beginning with May 2022 cycle  (18) bilateral salpingo-oophorectomy 02/06/2021   PLAN: Natasha Graham is tolerating her treatment well.  The tumor marker had been hovering in the 80s but went down to the upper 70s the most recent determination.  Of course this is not yet a trend but it indicates that we are having at least stable disease at this point.  Her counts are adequate for the upcoming surgery but I would be concerned if her white cell count dropped any further so I am asking her not to start her Ibrance  until the week after her upcoming hysterectomy.  Specifically she will start the next cycle on June 14, which is day 6 postop.  That will carry her through the July 4 weekend and then she will have another break until July 26 so that when she has her implant placement again we are not dealing with low white cell counts.  Of course I am concerned about this drop in her dose.  She will continue the fulvestrant every 4 weeks as before.  We are continuing to follow the CA 27-29 every 4 weeks.  I am going to see her with her August treatment and at that point we will set her up for definitive staging studies  Total encounter time 25 minutes.Sarajane Jews C. Magrinat, MD 01/29/2021 1:03 PM  Oncology and Hematology [facility] [place], [place] [address] Tel. [phone]  Joylene Igo [phone]   I, Wilburn Mylar, am acting as scribe for Dr. Sarajane Jews C. Magrinat.  I, Lurline Del MD, have reviewed the above documentation for accuracy and completeness, and I agree with the above.   *Total Encounter Time as defined by the Centers for Medicare and Medicaid Services includes, in addition to the face-to-face time of a patient visit (documented in the note above) non-face-to-face time: obtaining and reviewing outside history, ordering and reviewing medications, tests or procedures,  care coordination (communications with other health care professionals or caregivers) and documentation in the medical record.

## 2021-01-29 NOTE — Progress Notes (Signed)
Spoke w/ via phone for pre-op interview---PT Lab needs dos----   NONE            Lab results-----lab appt 02-04-2021 830 for cbc bmp t & s, ekg 12-20-2020 epic COVID test -----02-04-2021 1000 (overnight stay) Arrive at -------1115 am 02-06-2021 NPO after MN NO Solid Food.  Clear liquids from MN until---1015 am then npo Medications to take morning of surgery -----none Diabetic medication -----n/a Patient instructed no nail polish to be worn day of surgery Patient instructed to bring photo id and insurance card day of surgery Patient aware to have Driver (ride ) / caregiver spouse clifford to drop pt off  for 24 hours after surgery  Patient Special Instructions -----pt given overnight stay instructions Pre-Op special Istructions -----none Patient verbalized understanding of instructions that were given at this phone interview. Patient denies shortness of breath, chest pain, fever, cough at this phone interview.

## 2021-01-29 NOTE — Progress Notes (Signed)
YOU ARE SCHEDULED FOR A COVID TEST 02-04-2021 at 1000 AM.  THIS TEST MUST BE DONE BEFORE SURGERY. GO TO  [facility]. [place], [place], IT IS APPROXIMATELY 2 MINUTES PAST [employer] ON THE RIGHT AND REMAIN IN YOUR CAR, THIS IS A DRIVE UP TEST.       Your procedure is scheduled on 02-06-2021  Report to [facility].   Call this number if you have problems the morning of surgery  :[phone].   OUR ADDRESS IS [address].  WE ARE LOCATED IN [facility].  PLEASE BRING YOUR INSURANCE CARD AND PHOTO ID DAY OF SURGERY.  ONLY ONE PERSON ALLOWED IN FACILITY WAITING AREA.                                     REMEMBER:  DO NOT EAT FOOD, CANDY GUM OR MINTS  AFTER MIDNIGHT . YOU MAY HAVE CLEAR LIQUIDS FROM MIDNIGHT UNTIL 1015 AM. NO CLEAR LIQUIDS AFTER 1015 AM DAY OF SURGERY.   YOU MAY  BRUSH YOUR TEETH MORNING OF SURGERY AND RINSE YOUR MOUTH OUT, NO CHEWING GUM CANDY OR MINTS.    CLEAR LIQUID DIET   Foods Allowed                                                                     Foods Excluded  Coffee and tea, regular and decaf                             liquids that you cannot  Plain Jell-O any favor except red or purple                                           see through such as: Fruit ices (not with fruit pulp)                                     milk, soups, orange juice  Iced Popsicles                                    All solid food Carbonated beverages, regular and diet                                    Cranberry, grape and apple juices Sports drinks like Gatorade Lightly seasoned clear broth or consume(fat free) Sugar, honey syrup  Sample Menu Breakfast                                Lunch  Supper Cranberry juice                    Beef broth                            Chicken broth Jell-O                                     Grape juice                           Apple  juice Coffee or tea                        Jell-O                                      Popsicle                                                Coffee or tea                        Coffee or tea  _____________________________________________________________________     TAKE THESE MEDICATIONS MORNING OF SURGERY WITH A SIP OF WATER: NONE.  ONE VISITOR IS ALLOWED IN WAITING ROOM ONLY DAY OF SURGERY.  NO VISITOR MAY SPEND THE NIGHT.  VISITOR ARE ALLOWED TO STAY UNTIL 800 PM.                                    DO NOT WEAR JEWERLY, MAKE UP. DO NOT WEAR LOTIONS, POWDERS, PERFUMES OR NAIL POLISH. DO NOT SHAVE FOR 24 HOURS PRIOR TO DAY OF SURGERY. MEN MAY SHAVE FACE AND NECK. CONTACTS, GLASSES, OR DENTURES MAY NOT BE WORN TO SURGERY.                                    [facility] IS NOT RESPONSIBLE  FOR ANY BELONGINGS.                                                                    Marland Kitchen           [facility] - Preparing for Surgery Before surgery, you can play an important role.  Because skin is not sterile, your skin needs to be as free of germs as possible.  You can reduce the number of germs on your skin by washing with CHG (chlorahexidine gluconate) soap before surgery.  CHG is an antiseptic cleaner which kills germs and bonds with the skin to continue killing germs even after washing. Please DO NOT use if you have an allergy to CHG or antibacterial soaps.  If your skin becomes reddened/irritated stop  using the CHG and inform your nurse when you arrive at Short Stay. Do not shave (including legs and underarms) for at least 48 hours prior to the first CHG shower.  You may shave your face/neck. Please follow these instructions carefully:  1.  Shower with CHG Soap the night before surgery and the  morning of Surgery.  2.  If you choose to wash your hair, wash your hair first as usual with your  normal  shampoo.  3.  After you shampoo, rinse your hair and body thoroughly to remove the  shampoo.                             4.  Use CHG as you would any other liquid soap.  You can apply chg directly  to the skin and wash                      Gently with a scrungie or clean washcloth.  5.  Apply the CHG Soap to your body ONLY FROM THE NECK DOWN.   Do not use on face/ open                           Wound or open sores. Avoid contact with eyes, ears mouth and genitals (private parts).                       Wash face,  Genitals (private parts) with your normal soap.             6.  Wash thoroughly, paying special attention to the area where your surgery  will be performed.  7.  Thoroughly rinse your body with warm water from the neck down.  8.  DO NOT shower/wash with your normal soap after using and rinsing off  the CHG Soap.                9.  Pat yourself dry with a clean towel.            10.  Wear clean pajamas.            11.  Place clean sheets on your bed the night of your first shower and do not  sleep with pets. Day of Surgery : Do not apply any lotions/deodorants the morning of surgery.  Please wear clean clothes to the hospital/surgery center.  FAILURE TO FOLLOW THESE INSTRUCTIONS MAY RESULT IN THE CANCELLATION OF YOUR SURGERY PATIENT SIGNATURE_________________________________  NURSE SIGNATURE__________________________________  ________________________________________________________________________                                                        QUESTIONS Natasha Graham PRE OP NURSE PHONE [phone].

## 2021-01-29 NOTE — Patient Instructions (Addendum)
Fulvestrant injection What is this medicine? FULVESTRANT (ful VES trant) blocks the effects of estrogen. It is used to treat breast cancer. This medicine may be used for other purposes; ask your health care provider or pharmacist if you have questions. COMMON BRAND NAME(S): FASLODEX What should I tell my health care provider before I take this medicine? They need to know if you have any of these conditions:  bleeding disorders  liver disease  low blood counts, like low white cell, platelet, or red cell counts  an unusual or allergic reaction to fulvestrant, other medicines, foods, dyes, or preservatives  pregnant or trying to get pregnant  breast-feeding How should I use this medicine? This medicine is for injection into a muscle. It is usually given by a health care professional in a hospital or clinic setting. Talk to your pediatrician regarding the use of this medicine in children. Special care may be needed. Overdosage: If you think you have taken too much of this medicine contact a poison control center or emergency room at once. NOTE: This medicine is only for you. Do not share this medicine with others. What if I miss a dose? It is important not to miss your dose. Call your doctor or health care professional if you are unable to keep an appointment. What may interact with this medicine?  medicines that treat or prevent blood clots like warfarin, enoxaparin, dalteparin, apixaban, dabigatran, and rivaroxaban This list may not describe all possible interactions. Give your health care provider a list of all the medicines, herbs, non-prescription drugs, or dietary supplements you use. Also tell them if you smoke, drink alcohol, or use illegal drugs. Some items may interact with your medicine. What should I watch for while using this medicine? Your condition will be monitored carefully while you are receiving this medicine. You will need important blood work done while you are taking  this medicine. Do not become pregnant while taking this medicine or for at least 1 year after stopping it. Women of child-bearing potential will need to have a negative pregnancy test before starting this medicine. Women should inform their doctor if they wish to become pregnant or think they might be pregnant. There is a potential for serious side effects to an unborn child. Men should inform their doctors if they wish to father a child. This medicine may lower sperm counts. Talk to your health care professional or pharmacist for more information. Do not breast-feed an infant while taking this medicine or for 1 year after the last dose. What side effects may I notice from receiving this medicine? Side effects that you should report to your doctor or health care professional as soon as possible:  allergic reactions like skin rash, itching or hives, swelling of the face, lips, or tongue  feeling faint or lightheaded, falls  pain, tingling, numbness, or weakness in the legs  signs and symptoms of infection like fever or chills; cough; flu-like symptoms; sore throat  vaginal bleeding Side effects that usually do not require medical attention (report to your doctor or health care professional if they continue or are bothersome):  aches, pains  constipation  diarrhea  headache  hot flashes  nausea, vomiting  pain at site where injected  stomach pain This list may not describe all possible side effects. Call your doctor for medical advice about side effects. You may report side effects to FDA at 1-800-FDA-1088. Where should I keep my medicine? This drug is given in a hospital or clinic and will  not be stored at home. NOTE: This sheet is a summary. It may not cover all possible information. If you have questions about this medicine, talk to your doctor, pharmacist, or health care provider.  2021 Elsevier/Gold Standard (2017-11-25 11:34:41) Denosumab injection What is this  medicine? DENOSUMAB (den oh sue mab) slows bone breakdown. Prolia is used to treat osteoporosis in women after menopause and in men, and in people who are taking corticosteroids for 6 months or more. Xgeva is used to treat a high calcium level due to cancer and to prevent bone fractures and other bone problems caused by multiple myeloma or cancer bone metastases. Xgeva is also used to treat giant cell tumor of the bone. This medicine may be used for other purposes; ask your health care provider or pharmacist if you have questions. COMMON BRAND NAME(S): Prolia, XGEVA What should I tell my health care provider before I take this medicine? They need to know if you have any of these conditions:  dental disease  having surgery or tooth extraction  infection  kidney disease  low levels of calcium or Vitamin D in the blood  malnutrition  on hemodialysis  skin conditions or sensitivity  thyroid or parathyroid disease  an unusual reaction to denosumab, other medicines, foods, dyes, or preservatives  pregnant or trying to get pregnant  breast-feeding How should I use this medicine? This medicine is for injection under the skin. It is given by a health care professional in a hospital or clinic setting. A special MedGuide will be given to you before each treatment. Be sure to read this information carefully each time. For Prolia, talk to your pediatrician regarding the use of this medicine in children. Special care may be needed. For Xgeva, talk to your pediatrician regarding the use of this medicine in children. While this drug may be prescribed for children as young as 13 years for selected conditions, precautions do apply. Overdosage: If you think you have taken too much of this medicine contact a poison control center or emergency room at once. NOTE: This medicine is only for you. Do not share this medicine with others. What if I miss a dose? It is important not to miss your dose. Call  your doctor or health care professional if you are unable to keep an appointment. What may interact with this medicine? Do not take this medicine with any of the following medications:  other medicines containing denosumab This medicine may also interact with the following medications:  medicines that lower your chance of fighting infection  steroid medicines like prednisone or cortisone This list may not describe all possible interactions. Give your health care provider a list of all the medicines, herbs, non-prescription drugs, or dietary supplements you use. Also tell them if you smoke, drink alcohol, or use illegal drugs. Some items may interact with your medicine. What should I watch for while using this medicine? Visit your doctor or health care professional for regular checks on your progress. Your doctor or health care professional may order blood tests and other tests to see how you are doing. Call your doctor or health care professional for advice if you get a fever, chills or sore throat, or other symptoms of a cold or flu. Do not treat yourself. This drug may decrease your body's ability to fight infection. Try to avoid being around people who are sick. You should make sure you get enough calcium and vitamin D while you are taking this medicine, unless your doctor tells   you not to. Discuss the foods you eat and the vitamins you take with your health care professional. See your dentist regularly. Brush and floss your teeth as directed. Before you have any dental work done, tell your dentist you are receiving this medicine. Do not become pregnant while taking this medicine or for 5 months after stopping it. Talk with your doctor or health care professional about your birth control options while taking this medicine. Women should inform their doctor if they wish to become pregnant or think they might be pregnant. There is a potential for serious side effects to an unborn child. Talk to your  health care professional or pharmacist for more information. What side effects may I notice from receiving this medicine? Side effects that you should report to your doctor or health care professional as soon as possible:  allergic reactions like skin rash, itching or hives, swelling of the face, lips, or tongue  bone pain  breathing problems  dizziness  jaw pain, especially after dental work  redness, blistering, peeling of the skin  signs and symptoms of infection like fever or chills; cough; sore throat; pain or trouble passing urine  signs of low calcium like fast heartbeat, muscle cramps or muscle pain; pain, tingling, numbness in the hands or feet; seizures  unusual bleeding or bruising  unusually weak or tired Side effects that usually do not require medical attention (report to your doctor or health care professional if they continue or are bothersome):  constipation  diarrhea  headache  joint pain  loss of appetite  muscle pain  runny nose  tiredness  upset stomach This list may not describe all possible side effects. Call your doctor for medical advice about side effects. You may report side effects to FDA at 1-800-FDA-1088. Where should I keep my medicine? This medicine is only given in a clinic, doctor's office, or other health care setting and will not be stored at home. NOTE: This sheet is a summary. It may not cover all possible information. If you have questions about this medicine, talk to your doctor, pharmacist, or health care provider.  2021 Elsevier/Gold Standard (2017-12-24 16:10:44)  

## 2021-01-30 ENCOUNTER — Telehealth: Payer: Self-pay | Admitting: Oncology

## 2021-01-30 LAB — CANCER ANTIGEN 27.29: CA 27.29: 64.1 U/mL — ABNORMAL HIGH (ref 0.0–38.6)

## 2021-01-30 NOTE — Telephone Encounter (Signed)
Scheduled appointment per 06/01 los. Patient is aware.

## 2021-02-04 ENCOUNTER — Encounter (HOSPITAL_COMMUNITY)
Admission: RE | Admit: 2021-02-04 | Discharge: 2021-02-04 | Disposition: A | Discharge status: Home / Self Care | Payer: BC Managed Care – PPO | Source: Ambulatory Visit | Attending: Obstetrics and Gynecology | Admitting: Obstetrics and Gynecology

## 2021-02-04 ENCOUNTER — Other Ambulatory Visit (HOSPITAL_COMMUNITY)
Admission: RE | Admit: 2021-02-04 | Discharge: 2021-02-04 | Disposition: A | Discharge status: Home / Self Care | Payer: BC Managed Care – PPO | Source: Ambulatory Visit | Attending: Obstetrics and Gynecology | Admitting: Obstetrics and Gynecology

## 2021-02-04 ENCOUNTER — Other Ambulatory Visit: Payer: Self-pay

## 2021-02-04 DIAGNOSIS — Z01812 Encounter for preprocedural laboratory examination: Secondary | ICD-10-CM | POA: Insufficient documentation

## 2021-02-04 DIAGNOSIS — Z20822 Contact with and (suspected) exposure to covid-19: Secondary | ICD-10-CM | POA: Insufficient documentation

## 2021-02-04 LAB — CBC
HCT: 34.8 % — ABNORMAL LOW (ref 36.0–46.0)
Hemoglobin: 12.6 g/dL (ref 12.0–15.0)
MCH: 37.6 pg — ABNORMAL HIGH (ref 26.0–34.0)
MCHC: 36.2 g/dL — ABNORMAL HIGH (ref 30.0–36.0)
MCV: 103.9 fL — ABNORMAL HIGH (ref 80.0–100.0)
Platelets: 261 10*3/uL (ref 150–400)
RBC: 3.35 MIL/uL — ABNORMAL LOW (ref 3.87–5.11)
RDW: 11.6 % (ref 11.5–15.5)
WBC: 3.5 10*3/uL — ABNORMAL LOW (ref 4.0–10.5)
nRBC: 0 % (ref 0.0–0.2)

## 2021-02-04 LAB — BASIC METABOLIC PANEL
Anion gap: 6 (ref 5–15)
BUN: 14 mg/dL (ref 8–23)
CO2: 31 mmol/L (ref 22–32)
Calcium: 9.6 mg/dL (ref 8.9–10.3)
Chloride: 103 mmol/L (ref 98–111)
Creatinine, Ser: 0.76 mg/dL (ref 0.44–1.00)
GFR, Estimated: >60 mL/min (ref >60)
Glucose, Bld: 99 mg/dL (ref 70–99)
Potassium: 3.4 mmol/L — ABNORMAL LOW (ref 3.5–5.1)
Sodium: 140 mmol/L (ref 135–145)

## 2021-02-04 LAB — SARS CORONAVIRUS 2 (TAT 6-24 HRS): SARS Coronavirus 2: NEGATIVE

## 2021-02-05 NOTE — H&P (Signed)
Natasha Graham is an 60 y.o. female. Complex endometrial hyperplasia in a polyp by Diag HS for hysterectomy and BSO  Pertinent Gynecological History: Menses: post-menopausal Bleeding: na Contraception: none DES exposure: denies Blood transfusions: none Sexually transmitted diseases: no past history Previous GYN Procedures: DNC  Last mammogram:  na  Date: na Last pap: normal Date: 2021 OB History: G2, P2   Menstrual History: Menarche age: 81 Patient's last menstrual period was 10/16/2013.    Past Medical History:  Diagnosis Date   Cancer [facility])    breast cancer left 2021 roght 2015   Family history of brain cancer    Family history of breast cancer    Family history of colon cancer    Family history of stomach cancer    History of radiation therapy last done February 06 2020   Hypertension    PMB (postmenopausal bleeding)    PONV (postoperative nausea and vomiting)    likes scopolamine patch   Scoliosis    Wears glasses    Wears glasses     Past Surgical History:  Procedure Laterality Date   AUGMENTATION MAMMAPLASTY Right    2015 post mastectomy   AXILLARY LYMPH NODE DISSECTION Left 11/13/2019   Procedure: LEFT AXILLARY LYMPH NODE DISSECTION;  Surgeon: Rolm Bookbinder, MD;  Location: [facility];  Service: General;  Laterality: Left;   BREAST RECONSTRUCTION WITH PLACEMENT OF TISSUE EXPANDER AND FLEX HD (ACELLULAR HYDRATED DERMIS) Right 05/16/2014   Procedure: IMMEDIATE RIGHT BREAST RECONSTRUCTION WITH PLACEMENT OF TISSUE EXPANDER AND FLEX HD (ACELLULAR HYDRATED DERMIS);  Surgeon: Theodoro Kos, DO;  Location: [facility];  Service: Plastics;  Laterality: Right;   BREAST RECONSTRUCTION WITH PLACEMENT OF TISSUE EXPANDER AND FLEX HD (ACELLULAR HYDRATED DERMIS) Left 09/27/2019   Procedure: LEFT BREAST RECONSTRUCTION WITH PLACEMENT OF TISSUE EXPANDER AND FLEX HD (ACELLULAR HYDRATED DERMIS);  Surgeon: Wallace Going, DO;  Location: [place] [facility];   Service: Plastics;  Laterality: Left;   BREAST SURGERY     right breast excisional biopsy   DILATATION & CURETTAGE/HYSTEROSCOPY WITH MYOSURE N/A 12/20/2020   Procedure: [place];  Surgeon: Brien Few, MD;  Location: [facility];  Service: Gynecology;  Laterality: N/A;   DILATION AND CURETTAGE OF UTERUS     HYSTEROSCOPY WITH D & C N/A 09/20/2015   Procedure: DILATATION AND CURETTAGE /HYSTEROSCOPY;  Surgeon: Brien Few, MD;  Location: [facility];  Service: Gynecology;  Laterality: N/A;   LIPOSUCTION Bilateral 08/08/2014   Procedure: LIPO SUCTION ;  Surgeon: Theodoro Kos, DO;  Location: [facility];  Service: Plastics;  Laterality: Bilateral;   MASTECTOMY Right 05/16/2014   placement of acellular dermal matrix & tissue expanders    MASTOPEXY Left 08/08/2014   Procedure:  MASTOPEXY FOR SYMMETRY;  Surgeon: Theodoro Kos, DO;  Location: [facility];  Service: Plastics;  Laterality: Left;   NIPPLE SPARING MASTECTOMY WITH SENTINEL LYMPH NODE BIOPSY Left 09/27/2019   Procedure: LEFT NIPPLE SPARING MASTECTOMY WITH LEFT AXILLARY SENTINEL LYMPH NODE BIOPSY;  Surgeon: Rolm Bookbinder, MD;  Location: [facility];  Service: General;  Laterality: Left;   REDUCTION MAMMAPLASTY Left    2015   REMOVAL OF TISSUE EXPANDER AND PLACEMENT OF IMPLANT Right 08/08/2014   Procedure: REMOVAL OF RIGHT  TISSUE EXPANDERS WITH PLACEMENT OF RIGHT BREAST IMPLANTS WITH LIPO SUCTION ;  Surgeon: Theodoro Kos, DO;  Location: [facility];  Service: Plastics;  Laterality: Right;   SCAR REVISION  Left 11/13/2019   Procedure: EXCISION OF LEFT MASTECTOMY SKIN;  Surgeon: Rolm Bookbinder, MD;  Location: [facility];  Service: General;  Laterality: Left;   scoliosis  1972   harrington rods-age 80   TONSILLECTOMY  as child    Family History  Problem Relation Age of Onset   Heart disease Mother    Cancer  Father        liver   Colon cancer Father 66   Head & neck cancer Father        oral cancer - dx in 40s-50s   Breast cancer Paternal Aunt        dx <50   Brain cancer Brother        possible meningioma   Cancer Maternal Aunt        liver/lung cancer   Stomach cancer Paternal Uncle    Heart disease Maternal Grandmother    Lymphoma Maternal Grandfather        NHL   Heart attack Paternal Grandfather    Cancer Paternal Uncle        NOS    Social History:  reports that she has never smoked. She has never used smokeless tobacco. She reports that she does not drink alcohol and does not use drugs.  Allergies: No Known Allergies  No medications prior to admission.    Review of Systems  Constitutional: Negative.   All other systems reviewed and are negative.  BP (!) 165/90   Pulse 86   Temp 97.9 F (36.6 C) (Oral)   Resp 16   Ht 5\' 4"  (1.626 m)   Wt 76.8 kg   LMP 10/16/2013   SpO2 100%   BMI 29.06 kg/m   Last menstrual period 10/16/2013. Physical Exam Vitals and nursing note reviewed.  Constitutional:      Appearance: Normal appearance.  HENT:     Head: Normocephalic and atraumatic.  Cardiovascular:     Rate and Rhythm: Normal rate and regular rhythm.     Pulses: Normal pulses.     Heart sounds: Normal heart sounds.  Pulmonary:     Effort: Pulmonary effort is normal.     Breath sounds: Normal breath sounds.  Abdominal:     Palpations: Abdomen is soft.  Genitourinary:    General: Normal vulva.     Rectum: Normal.  Musculoskeletal:        General: Normal range of motion.     Cervical back: Normal range of motion and neck supple.  Skin:    General: Skin is warm.  Neurological:     General: No focal deficit present.     Mental Status: She is alert and oriented to person, place, and time.  Psychiatric:        Mood and Affect: Mood normal.        Behavior: Behavior normal.   CBC    Component Value Date/Time   WBC 3.5 (L) 02/04/2021 0819   RBC 3.35 (L)  02/04/2021 0819   HGB 12.6 02/04/2021 0819   HGB 13.8 07/30/2014 1603   HCT 34.8 (L) 02/04/2021 0819   HCT 41.6 07/30/2014 1603   PLT 261 02/04/2021 0819   PLT 317 07/30/2014 1603   MCV 103.9 (H) 02/04/2021 0819   MCV 93.4 07/30/2014 1603   MCH 37.6 (H) 02/04/2021 0819   MCHC 36.2 (H) 02/04/2021 0819   RDW 11.6 02/04/2021 0819   RDW 12.2 07/30/2014 1603   LYMPHSABS 1.0 01/29/2021 1139   LYMPHSABS 1.8 07/30/2014 1603  MONOABS 0.3 01/29/2021 1139   MONOABS 0.5 07/30/2014 1603   EOSABS 0.0 01/29/2021 1139   EOSABS 0.1 07/30/2014 1603   BASOSABS 0.1 01/29/2021 1139   BASOSABS 0.0 07/30/2014 1603     No results found for this or any previous visit (from the past 24 hour(s)).  No results found.  Assessment/Plan: Complex endometrial hyperplasia in polyp Davinci TLH and BSO Surgical risks discussed.Risks of infection, bleeding , injury to surrounding organs with need for repair discussed. Inability to predict or prevent cancer noted with possible need for additional surgery and /or therapy discussed. Pt acknowledges , consent done and will proceed.   Fredrica Capano J 02/05/2021, 10:13 PM

## 2021-02-06 ENCOUNTER — Encounter (HOSPITAL_BASED_OUTPATIENT_CLINIC_OR_DEPARTMENT_OTHER): Payer: Self-pay | Admitting: Obstetrics and Gynecology

## 2021-02-06 ENCOUNTER — Ambulatory Visit (HOSPITAL_BASED_OUTPATIENT_CLINIC_OR_DEPARTMENT_OTHER): Payer: BC Managed Care – PPO | Admitting: Anesthesiology

## 2021-02-06 ENCOUNTER — Encounter (HOSPITAL_BASED_OUTPATIENT_CLINIC_OR_DEPARTMENT_OTHER)
Admission: RE | Disposition: A | Discharge status: Home / Self Care | Payer: Self-pay | Source: Home / Self Care | Attending: Obstetrics and Gynecology

## 2021-02-06 ENCOUNTER — Ambulatory Visit (HOSPITAL_BASED_OUTPATIENT_CLINIC_OR_DEPARTMENT_OTHER)
Admission: RE | Admit: 2021-02-06 | Discharge: 2021-02-07 | Disposition: A | Discharge status: Home / Self Care | Payer: BC Managed Care – PPO | Attending: Obstetrics and Gynecology | Admitting: Obstetrics and Gynecology

## 2021-02-06 DIAGNOSIS — N8502 Endometrial intraepithelial neoplasia [EIN]: Secondary | ICD-10-CM | POA: Diagnosis present

## 2021-02-06 DIAGNOSIS — N8500 Endometrial hyperplasia, unspecified: Secondary | ICD-10-CM | POA: Diagnosis present

## 2021-02-06 DIAGNOSIS — Z803 Family history of malignant neoplasm of breast: Secondary | ICD-10-CM | POA: Insufficient documentation

## 2021-02-06 DIAGNOSIS — K439 Ventral hernia without obstruction or gangrene: Secondary | ICD-10-CM | POA: Diagnosis not present

## 2021-02-06 DIAGNOSIS — K660 Peritoneal adhesions (postprocedural) (postinfection): Secondary | ICD-10-CM | POA: Diagnosis not present

## 2021-02-06 DIAGNOSIS — N815 Vaginal enterocele: Secondary | ICD-10-CM | POA: Diagnosis not present

## 2021-02-06 DIAGNOSIS — N8501 Benign endometrial hyperplasia: Secondary | ICD-10-CM | POA: Insufficient documentation

## 2021-02-06 DIAGNOSIS — N736 Female pelvic peritoneal adhesions (postinfective): Secondary | ICD-10-CM | POA: Insufficient documentation

## 2021-02-06 DIAGNOSIS — D259 Leiomyoma of uterus, unspecified: Secondary | ICD-10-CM | POA: Diagnosis not present

## 2021-02-06 DIAGNOSIS — Z853 Personal history of malignant neoplasm of breast: Secondary | ICD-10-CM | POA: Insufficient documentation

## 2021-02-06 DIAGNOSIS — N800 Endometriosis of uterus: Secondary | ICD-10-CM | POA: Insufficient documentation

## 2021-02-06 DIAGNOSIS — N840 Polyp of corpus uteri: Secondary | ICD-10-CM | POA: Diagnosis not present

## 2021-02-06 DIAGNOSIS — D251 Intramural leiomyoma of uterus: Secondary | ICD-10-CM | POA: Diagnosis not present

## 2021-02-06 HISTORY — PX: ROBOTIC ASSISTED TOTAL HYSTERECTOMY WITH BILATERAL SALPINGO OOPHERECTOMY: SHX6086

## 2021-02-06 LAB — TYPE AND SCREEN
ABO/RH(D): O POS
Antibody Screen: NEGATIVE

## 2021-02-06 LAB — ABO/RH: ABO/RH(D): O POS

## 2021-02-06 SURGERY — HYSTERECTOMY, TOTAL, ROBOT-ASSISTED, LAPAROSCOPIC, WITH BILATERAL SALPINGO-OOPHORECTOMY
Anesthesia: General | Site: Abdomen | Laterality: Bilateral

## 2021-02-06 MED ORDER — LACTATED RINGERS IV SOLN: INTRAVENOUS | Status: DC

## 2021-02-06 MED ORDER — HYDROCHLOROTHIAZIDE 25 MG PO TABS
25.0000 mg | ORAL_TABLET | Freq: Every day | ORAL | Status: DC
  Administered 2021-02-06: 25 mg via ORAL
  Filled 2021-02-06: qty 1

## 2021-02-06 MED ORDER — OXYCODONE-ACETAMINOPHEN 5-325 MG PO TABS
ORAL_TABLET | ORAL | Status: AC
  Filled 2021-02-06: qty 2

## 2021-02-06 MED ORDER — PHENYLEPHRINE 40 MCG/ML (10ML) SYRINGE FOR IV PUSH (FOR BLOOD PRESSURE SUPPORT)
PREFILLED_SYRINGE | INTRAVENOUS | Status: DC | PRN
  Administered 2021-02-06: 120 ug via INTRAVENOUS
  Administered 2021-02-06: 80 ug via INTRAVENOUS

## 2021-02-06 MED ORDER — KETOROLAC TROMETHAMINE 30 MG/ML IJ SOLN
30.0000 mg | Freq: Four times a day (QID) | INTRAMUSCULAR | Status: DC
  Administered 2021-02-06 – 2021-02-07 (×2): 30 mg via INTRAVENOUS

## 2021-02-06 MED ORDER — SODIUM CHLORIDE 0.9 % IV SOLN
INTRAVENOUS | Status: DC | PRN
  Administered 2021-02-06: 60 mL

## 2021-02-06 MED ORDER — CEFAZOLIN SODIUM-DEXTROSE 2-4 GM/100ML-% IV SOLN
INTRAVENOUS | Status: AC
  Filled 2021-02-06: qty 100

## 2021-02-06 MED ORDER — LIDOCAINE HCL (PF) 2 % IJ SOLN
INTRAMUSCULAR | Status: AC
  Filled 2021-02-06: qty 5

## 2021-02-06 MED ORDER — MIDAZOLAM HCL 5 MG/5ML IJ SOLN
INTRAMUSCULAR | Status: DC | PRN
  Administered 2021-02-06: 2 mg via INTRAVENOUS

## 2021-02-06 MED ORDER — DEXAMETHASONE SODIUM PHOSPHATE 4 MG/ML IJ SOLN
INTRAMUSCULAR | Status: DC | PRN
  Administered 2021-02-06: 10 mg via INTRAVENOUS

## 2021-02-06 MED ORDER — FENTANYL CITRATE (PF) 100 MCG/2ML IJ SOLN
INTRAMUSCULAR | Status: AC
  Filled 2021-02-06: qty 2

## 2021-02-06 MED ORDER — PHENYLEPHRINE 40 MCG/ML (10ML) SYRINGE FOR IV PUSH (FOR BLOOD PRESSURE SUPPORT)
PREFILLED_SYRINGE | INTRAVENOUS | Status: AC
  Filled 2021-02-06: qty 10

## 2021-02-06 MED ORDER — OXYCODONE-ACETAMINOPHEN 5-325 MG PO TABS
1.0000 | ORAL_TABLET | ORAL | Status: DC | PRN
  Administered 2021-02-06 – 2021-02-07 (×4): 2 via ORAL

## 2021-02-06 MED ORDER — ESMOLOL HCL 100 MG/10ML IV SOLN
INTRAVENOUS | Status: DC | PRN
  Administered 2021-02-06: 30 mg via INTRAVENOUS

## 2021-02-06 MED ORDER — FENTANYL CITRATE (PF) 100 MCG/2ML IJ SOLN
INTRAMUSCULAR | Status: DC | PRN
  Administered 2021-02-06 (×2): 50 ug via INTRAVENOUS
  Administered 2021-02-06: 100 ug via INTRAVENOUS
  Administered 2021-02-06 (×3): 50 ug via INTRAVENOUS

## 2021-02-06 MED ORDER — LIDOCAINE HCL (CARDIAC) PF 100 MG/5ML IV SOSY
PREFILLED_SYRINGE | INTRAVENOUS | Status: DC | PRN
  Administered 2021-02-06: 100 mg via INTRAVENOUS

## 2021-02-06 MED ORDER — SUGAMMADEX SODIUM 200 MG/2ML IV SOLN
INTRAVENOUS | Status: DC | PRN
  Administered 2021-02-06: 400 mg via INTRAVENOUS

## 2021-02-06 MED ORDER — CEFAZOLIN SODIUM-DEXTROSE 2-4 GM/100ML-% IV SOLN
2.0000 g | INTRAVENOUS | Status: AC
  Administered 2021-02-06: 2 g via INTRAVENOUS

## 2021-02-06 MED ORDER — SCOPOLAMINE 1 MG/3DAYS TD PT72
MEDICATED_PATCH | TRANSDERMAL | Status: DC | PRN
  Administered 2021-02-06: 1 via TRANSDERMAL

## 2021-02-06 MED ORDER — DEXAMETHASONE SODIUM PHOSPHATE 10 MG/ML IJ SOLN
INTRAMUSCULAR | Status: AC
  Filled 2021-02-06: qty 1

## 2021-02-06 MED ORDER — BUPIVACAINE HCL (PF) 0.25 % IJ SOLN
INTRAMUSCULAR | Status: DC | PRN
  Administered 2021-02-06: 28 mL

## 2021-02-06 MED ORDER — KETOROLAC TROMETHAMINE 30 MG/ML IJ SOLN
INTRAMUSCULAR | Status: AC
  Filled 2021-02-06: qty 1

## 2021-02-06 MED ORDER — IBUPROFEN 200 MG PO TABS: 600.0000 mg | ORAL_TABLET | Freq: Four times a day (QID) | ORAL | Status: DC

## 2021-02-06 MED ORDER — ESMOLOL HCL 100 MG/10ML IV SOLN
INTRAVENOUS | Status: AC
  Filled 2021-02-06: qty 10

## 2021-02-06 MED ORDER — SCOPOLAMINE 1 MG/3DAYS TD PT72
MEDICATED_PATCH | TRANSDERMAL | Status: AC
  Filled 2021-02-06: qty 1

## 2021-02-06 MED ORDER — ROCURONIUM BROMIDE 10 MG/ML (PF) SYRINGE
PREFILLED_SYRINGE | INTRAVENOUS | Status: AC
  Filled 2021-02-06: qty 10

## 2021-02-06 MED ORDER — PROPOFOL 10 MG/ML IV BOLUS
INTRAVENOUS | Status: AC
  Filled 2021-02-06: qty 20

## 2021-02-06 MED ORDER — LISINOPRIL-HYDROCHLOROTHIAZIDE 20-25 MG PO TABS: 1.0000 | ORAL_TABLET | Freq: Every day | ORAL | Status: DC

## 2021-02-06 MED ORDER — ROCURONIUM BROMIDE 100 MG/10ML IV SOLN
INTRAVENOUS | Status: DC | PRN
  Administered 2021-02-06: 20 mg via INTRAVENOUS
  Administered 2021-02-06: 60 mg via INTRAVENOUS

## 2021-02-06 MED ORDER — ONDANSETRON HCL 4 MG/2ML IJ SOLN
INTRAMUSCULAR | Status: AC
  Filled 2021-02-06: qty 2

## 2021-02-06 MED ORDER — MIDAZOLAM HCL 2 MG/2ML IJ SOLN
INTRAMUSCULAR | Status: AC
  Filled 2021-02-06: qty 2

## 2021-02-06 MED ORDER — LISINOPRIL 20 MG PO TABS
20.0000 mg | ORAL_TABLET | Freq: Every day | ORAL | Status: DC
  Administered 2021-02-06: 20 mg via ORAL
  Filled 2021-02-06: qty 1

## 2021-02-06 MED ORDER — FENTANYL CITRATE (PF) 250 MCG/5ML IJ SOLN
INTRAMUSCULAR | Status: AC
  Filled 2021-02-06: qty 5

## 2021-02-06 MED ORDER — PROPOFOL 10 MG/ML IV BOLUS
INTRAVENOUS | Status: DC | PRN
  Administered 2021-02-06 (×2): 50 mg via INTRAVENOUS
  Administered 2021-02-06: 200 mg via INTRAVENOUS
  Administered 2021-02-06 (×2): 50 mg via INTRAVENOUS

## 2021-02-06 MED ORDER — KETOROLAC TROMETHAMINE 30 MG/ML IJ SOLN
INTRAMUSCULAR | Status: DC | PRN
  Administered 2021-02-06: 30 mg via INTRAVENOUS

## 2021-02-06 MED ORDER — ONDANSETRON HCL 4 MG/2ML IJ SOLN
INTRAMUSCULAR | Status: DC | PRN
  Administered 2021-02-06: 4 mg via INTRAVENOUS

## 2021-02-06 MED ORDER — POVIDONE-IODINE 10 % EX SWAB: 2.0000 "application " | Freq: Once | CUTANEOUS | Status: DC

## 2021-02-06 SURGICAL SUPPLY — 58 items
ADH SKN CLS APL DERMABOND .7 (GAUZE/BANDAGES/DRESSINGS) ×1
BAG DECANTER FOR FLEXI CONT (MISCELLANEOUS) ×2 IMPLANT
CATH FOLEY 3WAY  5CC 16FR (CATHETERS) ×2
CATH FOLEY 3WAY 5CC 16FR (CATHETERS) ×1 IMPLANT
COVER BACK TABLE 60X90IN (DRAPES) ×2 IMPLANT
COVER TIP SHEARS 8 DVNC (MISCELLANEOUS) ×1 IMPLANT
COVER TIP SHEARS 8MM DA VINCI (MISCELLANEOUS) ×2
COVER WAND RF STERILE (DRAPES) ×2 IMPLANT
DEFOGGER SCOPE WARMER CLEARIFY (MISCELLANEOUS) ×2 IMPLANT
DERMABOND ADVANCED (GAUZE/BANDAGES/DRESSINGS) ×1
DERMABOND ADVANCED .7 DNX12 (GAUZE/BANDAGES/DRESSINGS) ×1 IMPLANT
DRAPE ARM DVNC X/XI (DISPOSABLE) ×4 IMPLANT
DRAPE COLUMN DVNC XI (DISPOSABLE) ×1 IMPLANT
DRAPE DA VINCI XI ARM (DISPOSABLE) ×8
DRAPE DA VINCI XI COLUMN (DISPOSABLE) ×2
DRAPE UTILITY XL STRL (DRAPES) ×2 IMPLANT
DURAPREP 26ML APPLICATOR (WOUND CARE) ×2 IMPLANT
ELECT REM PT RETURN 9FT ADLT (ELECTROSURGICAL) ×2
ELECTRODE REM PT RTRN 9FT ADLT (ELECTROSURGICAL) ×1 IMPLANT
GAUZE 4X4 16PLY RFD (DISPOSABLE) ×2 IMPLANT
GLOVE SURG ENC MOIS LTX SZ7 (GLOVE) ×2 IMPLANT
GLOVE SURG ENC MOIS LTX SZ7.5 (GLOVE) ×6 IMPLANT
GLOVE SURG POLYISO LF SZ6.5 (GLOVE) ×2 IMPLANT
GLOVE SURG UNDER POLY LF SZ6.5 (GLOVE) ×4 IMPLANT
GLOVE SURG UNDER POLY LF SZ7 (GLOVE) ×8 IMPLANT
HOLDER FOLEY CATH W/STRAP (MISCELLANEOUS) ×2 IMPLANT
IRRIG SUCT STRYKERFLOW 2 WTIP (MISCELLANEOUS) ×2
IRRIGATION SUCT STRKRFLW 2 WTP (MISCELLANEOUS) ×1 IMPLANT
IV NS 1000ML (IV SOLUTION) ×2
IV NS 1000ML BAXH (IV SOLUTION) ×1 IMPLANT
KIT TURNOVER CYSTO (KITS) ×2 IMPLANT
MANIFOLD NEPTUNE II (INSTRUMENTS) ×2 IMPLANT
NEEDLE INSUFFLATION 150MM (ENDOMECHANICALS) ×2 IMPLANT
OBTURATOR OPTICAL STANDARD 8MM (TROCAR) ×2
OBTURATOR OPTICAL STND 8 DVNC (TROCAR) ×1
OBTURATOR OPTICALSTD 8 DVNC (TROCAR) ×1 IMPLANT
OCCLUDER COLPOPNEUMO (BALLOONS) ×2 IMPLANT
PACK ROBOT GYN CUSTOM WL (TRAY / TRAY PROCEDURE) ×2 IMPLANT
PACK ROBOTIC GOWN (GOWN DISPOSABLE) ×2 IMPLANT
PACK TRENDGUARD 450 HYBRID PRO (MISCELLANEOUS) ×1 IMPLANT
PAD OB MATERNITY 4.3X12.25 (PERSONAL CARE ITEMS) ×2 IMPLANT
PAD PREP 24X48 CUFFED NSTRL (MISCELLANEOUS) ×2 IMPLANT
PROTECTOR NERVE ULNAR (MISCELLANEOUS) ×4 IMPLANT
SEAL CANN UNIV 5-8 DVNC XI (MISCELLANEOUS) ×3 IMPLANT
SEAL XI 5MM-8MM UNIVERSAL (MISCELLANEOUS) ×6
SET TRI-LUMEN FLTR TB AIRSEAL (TUBING) ×2 IMPLANT
SUT VIC AB 0 CT1 27 (SUTURE) ×2
SUT VIC AB 0 CT1 27XBRD ANBCTR (SUTURE) ×1 IMPLANT
SUT VICRYL 0 AB UR-6 (SUTURE) ×2 IMPLANT
SUT VICRYL RAPIDE 4/0 PS 2 (SUTURE) ×4 IMPLANT
SUT VLOC 180 0 9IN  GS21 (SUTURE) ×2
SUT VLOC 180 0 9IN GS21 (SUTURE) ×1 IMPLANT
SYR 50ML LL SCALE MARK (SYRINGE) ×2 IMPLANT
TIP UTERINE 6.7X6CM WHT DISP (MISCELLANEOUS) ×2 IMPLANT
TOWEL OR 17X26 10 PK STRL BLUE (TOWEL DISPOSABLE) ×2 IMPLANT
TRENDGUARD 450 HYBRID PRO PACK (MISCELLANEOUS) ×2
TROCAR PORT AIRSEAL 8X120 (TROCAR) ×2 IMPLANT
WATER STERILE IRR 1000ML POUR (IV SOLUTION) ×2 IMPLANT

## 2021-02-06 NOTE — Anesthesia Procedure Notes (Signed)
Procedure Name: Intubation Date/Time: 02/06/2021 1:46 PM Performed by: Justice Rocher, CRNA Pre-anesthesia Checklist: Patient identified, Emergency Drugs available, Suction available, Patient being monitored and Timeout performed Patient Re-evaluated:Patient Re-evaluated prior to induction Oxygen Delivery Method: Circle system utilized Preoxygenation: Pre-oxygenation with 100% oxygen Induction Type: IV induction Ventilation: Mask ventilation without difficulty Laryngoscope Size: Mac and 3 Grade View: Grade II Tube type: Oral Tube size: 7.0 mm Number of attempts: 1 Airway Equipment and Method: Stylet and Oral airway Placement Confirmation: ETT inserted through vocal cords under direct vision, positive ETCO2, breath sounds checked- equal and bilateral and CO2 detector Secured at: 23 cm Tube secured with: Tape Dental Injury: Teeth and Oropharynx as per pre-operative assessment

## 2021-02-06 NOTE — Anesthesia Postprocedure Evaluation (Signed)
Anesthesia Post Note  Patient: Natasha Graham  Procedure(s) Performed: XI ROBOTIC ASSISTED TOTAL HYSTERECTOMY WITH BILATERAL SALPINGO OOPHORECTOMY, LYSIS of SIGMOID ADHESIONS MCCALL CUL DE PLASTY (Bilateral: Abdomen)     Patient location during evaluation: PACU Anesthesia Type: General Level of consciousness: awake and alert Pain management: pain level controlled Vital Signs Assessment: post-procedure vital signs reviewed and stable Respiratory status: spontaneous breathing, nonlabored ventilation, respiratory function stable and patient connected to nasal cannula oxygen Cardiovascular status: blood pressure returned to baseline and stable Postop Assessment: no apparent nausea or vomiting Anesthetic complications: no   No notable events documented.  Last Vitals:  Vitals:   02/06/21 1630 02/06/21 1700  BP: (!) 155/81 (!) 157/85  Pulse:    Resp: 16 12  Temp: 36.6 C 36.4 C  SpO2: 100% 96%    Last Pain:  Vitals:   02/06/21 1700  TempSrc:   PainSc: 5                  Ewart Carrera S

## 2021-02-06 NOTE — Transfer of Care (Signed)
Immediate Anesthesia Transfer of Care Note  Patient: Natasha Graham  Procedure(s) Performed: Procedure(s) (LRB): XI ROBOTIC ASSISTED TOTAL HYSTERECTOMY WITH BILATERAL SALPINGO OOPHORECTOMY, LYSIS of SIGMOID ADHESIONS MCCALL CUL DE PLASTY (Bilateral)  Patient Location: PACU  Anesthesia Type: General  Level of Consciousness: awake, alert  and oriented  Airway & Oxygen Therapy: Patient Spontanous Breathing and Patient connected to nasal cannula oxygen  Post-op Assessment: Report given to PACU RN and Post -op Vital signs reviewed and stable  Post vital signs: Reviewed and stable  Complications: No apparent anesthesia complications  Last Vitals:  Vitals Value Taken Time  BP 144/77 02/06/21 1533  Temp    Pulse 79 02/06/21 1539  Resp 14 02/06/21 1539  SpO2 97 % 02/06/21 1539  Vitals shown include unvalidated device data.  Last Pain:  Vitals:   02/06/21 1130  TempSrc: Oral  PainSc: 0-No pain      Patients Stated Pain Goal: 4 (54/98/26 4158)  Complications: No notable events documented.

## 2021-02-06 NOTE — Op Note (Signed)
02/06/2021  3:14 PM  PATIENT:  Natasha Graham  60 y.o. female  PRE-OPERATIVE DIAGNOSIS:  Endometrial Hyperplasia  POST-OPERATIVE DIAGNOSIS:  Endometrial Hyperplasia Enterocele  PROCEDURE:  Procedure(s): XI ROBOTIC ASSISTED TOTAL HYSTERECTOMY BILATERAL SALPINGO OOPHORECTOMY LYSIS of SIGMOID ADHESIONS MCCALL CUL DE PLASTY  SURGEON:  Surgeon(s): Brien Few, MD  ASSISTANTSEddie Dibbles, CNM   ANESTHESIA:   local and general  ESTIMATED BLOOD LOSS: 10CC  DRAINS: Urinary Catheter (Foley)   LOCAL MEDICATIONS USED:  MARCAINE    and Amount: 30 ml  SPECIMEN:  Source of Specimen:  UTERUS, CERVIX,TUBES AND OVARIES  DISPOSITION OF SPECIMEN:  PATHOLOGY  COUNTS:  YES  DICTATION # [id number]  PLAN OF CARE: DC HOME IN AM, 23 HR OBS  PATIENT DISPOSITION:  PACU - hemodynamically stable.

## 2021-02-06 NOTE — Anesthesia Preprocedure Evaluation (Addendum)
Anesthesia Evaluation  Patient identified by MRN, date of birth, ID band Patient awake    Reviewed: Allergy & Precautions, NPO status , Patient's Chart, lab work & pertinent test results  History of Anesthesia Complications (+) PONV  Airway Mallampati: II  TM Distance: >3 FB     Dental   Pulmonary neg pulmonary ROS,    breath sounds clear to auscultation       Cardiovascular hypertension,  Rhythm:Regular Rate:Normal     Neuro/Psych negative neurological ROS     GI/Hepatic negative GI ROS, Neg liver ROS,   Endo/Other  negative endocrine ROS  Renal/GU negative Renal ROS     Musculoskeletal   Abdominal   Peds  Hematology negative hematology ROS (+)   Anesthesia Other Findings   Reproductive/Obstetrics                             Anesthesia Physical Anesthesia Plan  ASA: 3  Anesthesia Plan: General   Post-op Pain Management:    Induction: Intravenous  PONV Risk Score and Plan: 4 or greater and Ondansetron, Dexamethasone and Midazolam  Airway Management Planned: Oral ETT  Additional Equipment:   Intra-op Plan:   Post-operative Plan: Extubation in OR  Informed Consent: I have reviewed the patients History and Physical, chart, labs and discussed the procedure including the risks, benefits and alternatives for the proposed anesthesia with the patient or authorized representative who has indicated his/her understanding and acceptance.     Dental advisory given  Plan Discussed with: CRNA and Anesthesiologist  Anesthesia Plan Comments:        Anesthesia Quick Evaluation

## 2021-02-06 NOTE — Progress Notes (Signed)
Stood up from stretcher and moved to bed.

## 2021-02-06 NOTE — Op Note (Signed)
Natasha Graham, JANUS MEDICAL RECORD NO: [id number] ACCOUNT NO: [id number] DATE OF BIRTH: [birth year] FACILITY: [facility] LOCATION: [facility] PHYSICIAN: Lovenia Kim, MD  Operative Report   DATE OF PROCEDURE: 02/06/2021  PREOPERATIVE DIAGNOSES: Complex endometrial hyperplasia and a polyp.  POSTOPERATIVE DIAGNOSES:  Complex endometrial hyperplasia and a polyp plus uterine fibroid, enterocele, left sigmoid to adnexal adhesions.  PROCEDURE:  Da Vinci-assisted total laparoscopic hysterectomy, bilateral salpingo-oophorectomy, lysis of sigmoid to left adnexal adhesions, McCall culdoplasty.  SURGEON:  Brien Few, MD  ASSISTANT:  Derrell Lolling, CNM  TYPE OF ANESTHESIA:  Local and general.  ESTIMATED BLOOD LOSS:  10 mL.  COMPLICATIONS:  None.  DRAINS:  Foley.  COUNTS:  Correct.  DISPOSITION:  The patient to recovery in good condition.  DESCRIPTION OF PROCEDURE:  After being apprised of the risks of anesthesia, infection, bleeding, injury to surrounding organs, possible need for repair, delayed versus immediate complications including bowel and bladder injury, possible need for repair,  inability to cure cancer or prevent cancer by nature of surgery.  The patient's consent signed.  She was brought to the operating room where she was placed in the dorsal lithotomy position and administered general anesthetic without complication.  She  was prepped and draped in the usual sterile fashion.  Feet are placed in the [place]in stirrups.  Exam under anesthesia confirms a bulky anteflexed uterus and no other adnexal masses.  Foley catheter placed without difficulty.  Clear urine noted.  RUMI  retractor placed without difficulty.  Infraumbilical incision made.  Per review of previous CT scan, it has noted an epigastric hernia which is not extending down to the level of the umbilicus.  It is filled with only omental fat.  There is no evidence  of bowel involvement.  Otherwise, the trocar entry  is clear in the umbilical port.  Normal liver, gallbladder bed is visualized and normal appendiceal area.  Uterus with multiple fibroids.  Bilateral normal tubes and ovaries.  Two ports were placed on  the left under direct visualization, one port on the right.  After deep Trendelenburg position is established, the robot is docked in the standard fashion and the EndoShears and bipolar forceps are entered.  The ureters identified bilaterally.  The  retroperitoneal space on the left is entered.  The infundibulopelvic ligament is skeletonized, cauterized, and cut.  The adnexa is further attached down to the level of the uterus.  The ureter is identified, peristalsing on the medial leaf of the  peritoneum.  Round ligament is opened using bipolar cautery and sharp dissection.  Bladder flap is developed.  Uterine vessels are skeletonized, cauterized, but not cut.  Same procedure on the right side, whereby the retroperitoneal space is entered,  ureter is identified.  Infundibulopelvic ligament is skeletonized, cauterized, and cut.  The specimen is also dissected down to the level of the uterine vessels.  Round ligament opened. Bladder flap further developed. Sharp dissection exposing the proper  plane prior to the RUMI cup anteriorly is noted.  Both uterine vessels are then cauterized and cut.  Good hemostasis noted. At this time, the specimen is detached at the cervicovaginal junction and retracted into the vagina without difficulty.  Vagina  is closed in two imbricating layers using a 0 V-Loc suture.  McCall culdoplasty suture placed, two layers placed for vaginal closure, which is confirmed by the assistant.  At this time, irrigation is accomplished.  Ropivacaine is placed.  Good hemostasis  is assured.  All instruments are removed under direct visualization.  CO2 is released.  Incisions closed with 4-0 Vicryl, 0 Vicryl and Dermabond.  Dilute Marcaine solution placed.  Dilute ropivacaine solution has been placed  previously into the abdomen.  At this time, urine is clear.  Vaginal exam reveals a well-approximated vaginal cuff.  The patient tolerated the procedure well and is awakened and transferred to recovery in good condition.   SHW D: 02/06/2021 3:21:10 pm T: 02/06/2021 11:49:00 pm  JOB: [id number]/ [id number]

## 2021-02-07 ENCOUNTER — Encounter: Payer: BC Managed Care – PPO | Admitting: Surgical

## 2021-02-07 DIAGNOSIS — N840 Polyp of corpus uteri: Secondary | ICD-10-CM | POA: Diagnosis not present

## 2021-02-07 DIAGNOSIS — N800 Endometriosis of uterus: Secondary | ICD-10-CM | POA: Diagnosis not present

## 2021-02-07 DIAGNOSIS — Z853 Personal history of malignant neoplasm of breast: Secondary | ICD-10-CM | POA: Diagnosis not present

## 2021-02-07 DIAGNOSIS — K439 Ventral hernia without obstruction or gangrene: Secondary | ICD-10-CM | POA: Diagnosis not present

## 2021-02-07 DIAGNOSIS — N8500 Endometrial hyperplasia, unspecified: Secondary | ICD-10-CM | POA: Diagnosis not present

## 2021-02-07 DIAGNOSIS — N8501 Benign endometrial hyperplasia: Secondary | ICD-10-CM | POA: Diagnosis not present

## 2021-02-07 DIAGNOSIS — Z803 Family history of malignant neoplasm of breast: Secondary | ICD-10-CM | POA: Diagnosis not present

## 2021-02-07 DIAGNOSIS — D259 Leiomyoma of uterus, unspecified: Secondary | ICD-10-CM | POA: Diagnosis not present

## 2021-02-07 DIAGNOSIS — N736 Female pelvic peritoneal adhesions (postinfective): Secondary | ICD-10-CM | POA: Diagnosis not present

## 2021-02-07 LAB — BASIC METABOLIC PANEL
Anion gap: 8 (ref 5–15)
BUN: 11 mg/dL (ref 8–23)
CO2: 27 mmol/L (ref 22–32)
Calcium: 8.3 mg/dL — ABNORMAL LOW (ref 8.9–10.3)
Chloride: 100 mmol/L (ref 98–111)
Creatinine, Ser: 0.7 mg/dL (ref 0.44–1.00)
GFR, Estimated: >60 mL/min (ref >60)
Glucose, Bld: 158 mg/dL — ABNORMAL HIGH (ref 70–99)
Potassium: 3.1 mmol/L — ABNORMAL LOW (ref 3.5–5.1)
Sodium: 135 mmol/L (ref 135–145)

## 2021-02-07 LAB — CBC
HCT: 31.9 % — ABNORMAL LOW (ref 36.0–46.0)
Hemoglobin: 11.5 g/dL — ABNORMAL LOW (ref 12.0–15.0)
MCH: 37 pg — ABNORMAL HIGH (ref 26.0–34.0)
MCHC: 36.1 g/dL — ABNORMAL HIGH (ref 30.0–36.0)
MCV: 102.6 fL — ABNORMAL HIGH (ref 80.0–100.0)
Platelets: 254 10*3/uL (ref 150–400)
RBC: 3.11 MIL/uL — ABNORMAL LOW (ref 3.87–5.11)
RDW: 11.3 % — ABNORMAL LOW (ref 11.5–15.5)
WBC: 7.9 10*3/uL (ref 4.0–10.5)
nRBC: 0 % (ref 0.0–0.2)

## 2021-02-07 MED ORDER — OXYCODONE-ACETAMINOPHEN 5-325 MG PO TABS
ORAL_TABLET | ORAL | Status: AC
  Filled 2021-02-07: qty 2

## 2021-02-07 MED ORDER — OXYCODONE-ACETAMINOPHEN 5-325 MG PO TABS: 1.0000 | ORAL_TABLET | ORAL | 0 refills | Status: DC | PRN

## 2021-02-07 MED ORDER — KETOROLAC TROMETHAMINE 30 MG/ML IJ SOLN
INTRAMUSCULAR | Status: AC
  Filled 2021-02-07: qty 1

## 2021-02-07 NOTE — Progress Notes (Signed)
1 Day Post-Op Procedure(s) (LRB): XI ROBOTIC ASSISTED TOTAL HYSTERECTOMY WITH BILATERAL SALPINGO OOPHORECTOMY, LYSIS of SIGMOID ADHESIONS MCCALL CUL DE PLASTY (Bilateral)  Subjective: Patient reports nausea, incisional pain, tolerating PO, + flatus, and no problems voiding.    Objective: BP (!) 110/59 (BP Location: Right Arm)   Pulse (!) 58   Temp 98 F (36.7 C)   Resp 14   Ht 5\' 4"  (1.626 m)   Wt 76.8 kg   LMP 10/16/2013   SpO2 100%   BMI 29.06 kg/m   CBC    Component Value Date/Time   WBC 7.9 02/07/2021 0202   RBC 3.11 (L) 02/07/2021 0202   HGB 11.5 (L) 02/07/2021 0202   HGB 13.8 07/30/2014 1603   HCT 31.9 (L) 02/07/2021 0202   HCT 41.6 07/30/2014 1603   PLT 254 02/07/2021 0202   PLT 317 07/30/2014 1603   MCV 102.6 (H) 02/07/2021 0202   MCV 93.4 07/30/2014 1603   MCH 37.0 (H) 02/07/2021 0202   MCHC 36.1 (H) 02/07/2021 0202   RDW 11.3 (L) 02/07/2021 0202   RDW 12.2 07/30/2014 1603   LYMPHSABS 1.0 01/29/2021 1139   LYMPHSABS 1.8 07/30/2014 1603   MONOABS 0.3 01/29/2021 1139   MONOABS 0.5 07/30/2014 1603   EOSABS 0.0 01/29/2021 1139   EOSABS 0.1 07/30/2014 1603   BASOSABS 0.1 01/29/2021 1139   BASOSABS 0.0 07/30/2014 1603    I have reviewed patient's vital signs, intake and output, medications, and labs.  General: alert, cooperative, and appears stated age Resp: clear to auscultation bilaterally Cardio: regular rate and rhythm, S1, S2 normal, no murmur, click, rub or gallop GI: soft, non-tender; bowel sounds normal; no masses,  no organomegaly and incision: clean, dry, and intact Extremities: extremities normal, atraumatic, no cyanosis or edema and Homans sign is negative, no sign of DVT Vaginal Bleeding: minimal  Assessment: s/p Procedure(s): XI ROBOTIC ASSISTED TOTAL HYSTERECTOMY WITH BILATERAL SALPINGO OOPHORECTOMY, LYSIS of SIGMOID ADHESIONS MCCALL CUL DE PLASTY (Bilateral): stable  Plan: Advance diet Encourage ambulation Advance to PO  medication Discontinue IV fluids Discharge home  LOS: 0 days    Pearl Bents J 02/07/2021, 6:25 AM

## 2021-02-10 ENCOUNTER — Encounter (HOSPITAL_BASED_OUTPATIENT_CLINIC_OR_DEPARTMENT_OTHER): Payer: Self-pay | Admitting: Obstetrics and Gynecology

## 2021-02-10 LAB — SURGICAL PATHOLOGY

## 2021-02-18 ENCOUNTER — Other Ambulatory Visit: Payer: Self-pay | Admitting: Oncology

## 2021-02-18 ENCOUNTER — Other Ambulatory Visit (HOSPITAL_COMMUNITY): Payer: Self-pay

## 2021-02-19 ENCOUNTER — Other Ambulatory Visit: Payer: Self-pay | Admitting: Oncology

## 2021-02-19 ENCOUNTER — Encounter: Payer: Self-pay | Admitting: Oncology

## 2021-02-19 DIAGNOSIS — C50512 Malignant neoplasm of lower-outer quadrant of left female breast: Secondary | ICD-10-CM

## 2021-02-19 DIAGNOSIS — C7951 Secondary malignant neoplasm of bone: Secondary | ICD-10-CM

## 2021-02-19 MED ORDER — PALBOCICLIB 75 MG PO TABS
75.0000 mg | ORAL_TABLET | Freq: Every day | ORAL | 6 refills | Status: DC
  Filled 2021-02-26: qty 21, 28d supply, fill #0
  Filled 2021-04-01: qty 21, 28d supply, fill #1
  Filled 2021-05-16: qty 21, 28d supply, fill #2
  Filled 2021-06-06: qty 21, 28d supply, fill #3
  Filled 2021-07-02: qty 21, 28d supply, fill #4
  Filled 2021-08-01: qty 21, 28d supply, fill #5
  Filled 2021-09-03: qty 21, 28d supply, fill #6

## 2021-02-19 NOTE — Progress Notes (Unsigned)
Debbie underwent hysterectomy with bilateral salpingo-oophorectomy 02/06/2021 showing only atypical hyperplasia, no evidence of malignancy.

## 2021-02-24 ENCOUNTER — Other Ambulatory Visit (HOSPITAL_COMMUNITY): Payer: BC Managed Care – PPO

## 2021-02-25 ENCOUNTER — Ambulatory Visit (INDEPENDENT_AMBULATORY_CARE_PROVIDER_SITE_OTHER): Payer: BC Managed Care – PPO | Admitting: Surgical

## 2021-02-25 ENCOUNTER — Other Ambulatory Visit: Payer: Self-pay

## 2021-02-25 ENCOUNTER — Encounter: Payer: Self-pay | Admitting: Surgical

## 2021-02-25 VITALS — Ht 64.0 in

## 2021-02-25 DIAGNOSIS — Z9011 Acquired absence of right breast and nipple: Secondary | ICD-10-CM

## 2021-02-25 DIAGNOSIS — Z170 Estrogen receptor positive status [ER+]: Secondary | ICD-10-CM

## 2021-02-25 DIAGNOSIS — Z9012 Acquired absence of left breast and nipple: Secondary | ICD-10-CM

## 2021-02-25 DIAGNOSIS — C50512 Malignant neoplasm of lower-outer quadrant of left female breast: Secondary | ICD-10-CM

## 2021-02-25 MED ORDER — ONDANSETRON HCL 4 MG PO TABS: 4.0000 mg | ORAL_TABLET | Freq: Three times a day (TID) | ORAL | 0 refills | Status: DC | PRN

## 2021-02-25 MED ORDER — CEPHALEXIN 500 MG PO CAPS: 500.0000 mg | ORAL_CAPSULE | Freq: Four times a day (QID) | ORAL | 0 refills | Status: AC

## 2021-02-25 NOTE — H&P (View-Only) (Signed)
Patient ID: Natasha Graham, female    DOB: 03/12/1961, 60 y.o.   MRN: [id number]  Chief Complaint  Patient presents with   Pre-op Exam      ICD-10-CM   1. Malignant neoplasm of lower-outer quadrant of left breast of female, estrogen receptor positive ([place])  C50.512    Z17.0     2. S/P mastectomy, left  Z90.12     3. Acquired absence of right breast  Z90.11        History of Present Illness: Natasha Graham is a 60 y.o.  female.  She presents for preoperative evaluation for upcoming procedure, removal of left breast expander for saline implant and exchange of right saline implant, scheduled for 03/19/2021 with Dr. Marla Roe.  She has a history of nausea with anesthesia.  No other complications from anesthesia. No history of DVT/PE.  No family history of DVT/PE.  No family or personal history of bleeding or clotting disorders.  Patient is not currently taking any blood thinners.  No history of CVA/MI.   In 2015 patient underwent right mastectomy with expander placement and then had a right saline implant placed and left mastopexy for symmetry.  She subsequently was diagnosed with left lobular breast cancer and underwent left breast reconstruction.  She currently has a tissue expander in place which has 310 cc and a 455 cc expander.  She currently has a Product manager smooth round high profile saline 500 cc implant in the right breast that is filled to 600 cc.  She has a history of radiation to the left breast which was completed in June 2021.  PMH Significant for: Postoperative nausea vomiting and hypertension.  Patient reports she is comfortable with the size of the left breast and would like the right breast to match.  She would also like for the level of the inframammary folds to match.  Past Medical History: Allergies: No Known Allergies  Current Medications:  Current Outpatient Medications:    acetaminophen (TYLENOL) 500 MG tablet, Take 500 mg by mouth., Disp: , Rfl:     Ascorbic Acid (VITAMIN C) 100 MG tablet, Take 100 mg by mouth daily. 1000 mg, Disp: , Rfl:    cephALEXin (KEFLEX) 500 MG capsule, Take 1 capsule (500 mg total) by mouth 4 (four) times daily for 5 days., Disp: 20 capsule, Rfl: 0   denosumab (XGEVA) 120 MG/1.7ML SOLN injection, Inject 120 mg into the skin once. Monthly, Disp: , Rfl:    Flaxseed Oil (LINSEED OIL) OIL, by Misc.(Non-Drug; Combo Route) route., Disp: , Rfl:    Fulvestrant (FASLODEX IM), Inject into the muscle. Shot every 4 weeks., Disp: , Rfl:    Ginger, Zingiber officinalis, (GINGER PO), Take by mouth., Disp: , Rfl:    ibuprofen (ADVIL) 200 MG tablet, Take by mouth. Takes 2 of 200 mg, Disp: , Rfl:    lisinopril-hydrochlorothiazide (ZESTORETIC) 20-25 MG tablet, Take 1 tablet by mouth daily at 2 PM. Pt states lisinopril increased to 30 mg on 12-12-2020, Disp: , Rfl:    Magnesium 250 MG TABS, Take by mouth., Disp: , Rfl:    Multiple Vitamin (MULTIVITAMIN) capsule, Take by mouth., Disp: , Rfl:    ondansetron (ZOFRAN) 4 MG tablet, Take 1 tablet (4 mg total) by mouth every 8 (eight) hours as needed for nausea or vomiting., Disp: 20 tablet, Rfl: 0   OVER THE COUNTER MEDICATION, Calcium 1200mg -Take daily., Disp: , Rfl:    OVER THE COUNTER MEDICATION, Trubiotics-Take daily., Disp: , Rfl:  palbociclib (IBRANCE) 75 MG tablet, Take 1 tablet (75 mg total) by mouth daily. Take for 21 days on, 7 days off, repeat every 28 days., Disp: 21 tablet, Rfl: 6   traMADol (ULTRAM) 50 MG tablet, Take 1-2 tablets (50-100 mg total) by mouth every 6 (six) hours as needed., Disp: 20 tablet, Rfl: 0   Turmeric 500 MG CAPS, Take by mouth., Disp: , Rfl:    oxyCODONE-acetaminophen (PERCOCET/ROXICET) 5-325 MG tablet, Take 1-2 tablets by mouth every 4 (four) hours as needed for severe pain., Disp: 30 tablet, Rfl: 0  Past Medical Problems: Past Medical History:  Diagnosis Date   Cancer ([place])    breast cancer left 2021 roght 2015   Family history of brain cancer     Family history of breast cancer    Family history of colon cancer    Family history of stomach cancer    History of radiation therapy last done February 06 2020   Hypertension    PMB (postmenopausal bleeding)    PONV (postoperative nausea and vomiting)    likes scopolamine patch   Scoliosis    Wears glasses    Wears glasses     Past Surgical History: Past Surgical History:  Procedure Laterality Date   AUGMENTATION MAMMAPLASTY Right    2015 post mastectomy   AXILLARY LYMPH NODE DISSECTION Left 11/13/2019   Procedure: LEFT AXILLARY LYMPH NODE DISSECTION;  Surgeon: Rolm Bookbinder, MD;  Location: [facility];  Service: General;  Laterality: Left;   BREAST RECONSTRUCTION WITH PLACEMENT OF TISSUE EXPANDER AND FLEX HD (ACELLULAR HYDRATED DERMIS) Right 05/16/2014   Procedure: IMMEDIATE RIGHT BREAST RECONSTRUCTION WITH PLACEMENT OF TISSUE EXPANDER AND FLEX HD (ACELLULAR HYDRATED DERMIS);  Surgeon: Theodoro Kos, DO;  Location: [facility];  Service: Plastics;  Laterality: Right;   BREAST RECONSTRUCTION WITH PLACEMENT OF TISSUE EXPANDER AND FLEX HD (ACELLULAR HYDRATED DERMIS) Left 09/27/2019   Procedure: LEFT BREAST RECONSTRUCTION WITH PLACEMENT OF TISSUE EXPANDER AND FLEX HD (ACELLULAR HYDRATED DERMIS);  Surgeon: Wallace Going, DO;  Location: [facility];  Service: Plastics;  Laterality: Left;   BREAST SURGERY     right breast excisional biopsy   DILATATION & CURETTAGE/HYSTEROSCOPY WITH MYOSURE N/A 12/20/2020   Procedure: [place];  Surgeon: Brien Few, MD;  Location: [facility];  Service: Gynecology;  Laterality: N/A;   DILATION AND CURETTAGE OF UTERUS     HYSTEROSCOPY WITH D & C N/A 09/20/2015   Procedure: DILATATION AND CURETTAGE /HYSTEROSCOPY;  Surgeon: Brien Few, MD;  Location: [facility];  Service: Gynecology;  Laterality: N/A;   LIPOSUCTION Bilateral 08/08/2014   Procedure: LIPO SUCTION ;  Surgeon:  Theodoro Kos, DO;  Location: [facility];  Service: Plastics;  Laterality: Bilateral;   MASTECTOMY Right 05/16/2014   placement of acellular dermal matrix & tissue expanders    MASTOPEXY Left 08/08/2014   Procedure:  MASTOPEXY FOR SYMMETRY;  Surgeon: Theodoro Kos, DO;  Location: [facility];  Service: Plastics;  Laterality: Left;   NIPPLE SPARING MASTECTOMY WITH SENTINEL LYMPH NODE BIOPSY Left 09/27/2019   Procedure: LEFT NIPPLE SPARING MASTECTOMY WITH LEFT AXILLARY SENTINEL LYMPH NODE BIOPSY;  Surgeon: Rolm Bookbinder, MD;  Location: [facility];  Service: General;  Laterality: Left;   REDUCTION MAMMAPLASTY Left    2015   REMOVAL OF TISSUE EXPANDER AND PLACEMENT OF IMPLANT Right 08/08/2014   Procedure: REMOVAL OF RIGHT  TISSUE EXPANDERS WITH PLACEMENT OF RIGHT BREAST IMPLANTS WITH  LIPO SUCTION ;  Surgeon: Theodoro Kos, DO;  Location: [facility];  Service: Plastics;  Laterality: Right;   ROBOTIC ASSISTED TOTAL HYSTERECTOMY WITH BILATERAL SALPINGO OOPHERECTOMY Bilateral 02/06/2021   Procedure: XI ROBOTIC ASSISTED TOTAL HYSTERECTOMY WITH BILATERAL SALPINGO OOPHORECTOMY, LYSIS of SIGMOID ADHESIONS MCCALL CUL DE PLASTY;  Surgeon: Brien Few, MD;  Location: [facility];  Service: Gynecology;  Laterality: Bilateral;   SCAR REVISION Left 11/13/2019   Procedure: EXCISION OF LEFT MASTECTOMY SKIN;  Surgeon: Rolm Bookbinder, MD;  Location: [facility];  Service: General;  Laterality: Left;   scoliosis  1972   harrington rods-age 48   TONSILLECTOMY  as child    Social History: Social History   Socioeconomic History   Marital status: Married    Spouse name: Not on file   Number of children: Not on file   Years of education: Not on file   Highest education level: Not on file  Occupational History   Not on file  Tobacco Use   Smoking status: Never   Smokeless tobacco: Never  Vaping Use   Vaping Use:  Never used  Substance and Sexual Activity   Alcohol use: No   Drug use: No   Sexual activity: Not on file  Other Topics Concern   Not on file  Social History Narrative   Not on file   Social Determinants of Health   Financial Resource Strain: Not on file  Food Insecurity: Not on file  Transportation Needs: Not on file  Physical Activity: Not on file  Stress: Not on file  Social Connections: Not on file  Intimate Partner Violence: Not on file    Family History: Family History  Problem Relation Age of Onset   Heart disease Mother    Cancer Father        liver   Colon cancer Father 56   Head & neck cancer Father        oral cancer - dx in 85s-50s   Breast cancer Paternal Aunt        dx <50   Brain cancer Brother        possible meningioma   Cancer Maternal Aunt        liver/lung cancer   Stomach cancer Paternal Uncle    Heart disease Maternal Grandmother    Lymphoma Maternal Grandfather        NHL   Heart attack Paternal Grandfather    Cancer Paternal Uncle        NOS    Review of Systems: Review of Systems  Constitutional: Negative.   Respiratory: Negative.    Cardiovascular: Negative.   Gastrointestinal: Negative.   Musculoskeletal: Negative.   Neurological: Negative.    Physical Exam: Vital Signs Ht 5\' 4"  (1.626 m)   LMP 10/16/2013   BMI 29.06 kg/m   Physical Exam Constitutional:      General: Not in acute distress.    Appearance: Normal appearance. Not ill-appearing.  HENT:     Head: Normocephalic and atraumatic.  Eyes:     Pupils: Pupils are equal, round Neck:     Musculoskeletal: Normal range of motion.  Cardiovascular:     Rate and Rhythm: Normal rate    Pulses: Normal pulses.  Pulmonary:     Effort: Pulmonary effort is normal. No respiratory distress.  Abdominal:     General: Abdomen is flat. There is no distension.  Musculoskeletal: Normal range of motion.  Skin:    General: Skin is warm  and dry.     Findings: No erythema or rash.   Neurological:     General: No focal deficit present.     Mental Status: Alert and oriented to person, place, and time. Mental status is at baseline.     Motor: No weakness.  Psychiatric:        Mood and Affect: Mood normal.        Behavior: Behavior normal.    Assessment/Plan: The patient is scheduled for exchange of left breast for saline implant and exchange of right saline implant for new right saline implant with Dr. Marla Roe.  Risks, benefits, and alternatives of procedure discussed, questions answered and consent obtained.    Smoking Status: non smoker; Counseling Given? N/a  Caprini Score: 8, high; Risk Factors include: Age, BMI greater than 25, history of breast cancer, varicose veins and length of planned surgery. Recommendation for mechanical And pharmacological prophylaxis. Encourage early ambulation.   Pictures obtained: 12/10/2020  Post-op Rx sent to pharmacy: Zofran, Keflex.  Patient declined Norco prescription  Patient was provided with the General Surgical Risk consent document and Pain Medication Agreement prior to their appointment.  They had adequate time to read through the risk consent documents and Pain Medication Agreement. We also discussed them in person together during this preop appointment. All of their questions were answered to their satisfaction.  Recommended calling if they have any further questions.  Risk consent form and Pain Medication Agreement to be scanned into patient's chart.  The risks that can be encountered with and after placement of a breast implant were discussed and include the following but not limited to these: bleeding, infection, delayed healing, anesthesia risks, skin sensation changes, injury to structures including nerves, blood vessels, and muscles which may be temporary or permanent, allergies to tape, suture materials and glues, blood products, topical preparations or injected agents, skin contour irregularities, skin discoloration  and swelling, deep vein thrombosis, cardiac and pulmonary complications, pain, which may persist, fluid accumulation, wrinkling of the skin over the implanmt, changes in nipple or breast sensation, implant leakage or rupture, faulty position of the implant, persistent pain, formation of tight scar tissue around the implant (capsular contracture).  Patient was provided with the [employer] implant patient decision checklist and this was completed during today's preoperative evaluation. Patient had time to read through the information and any questions were answered to their content. Form will be scanned into patient's chart.  Patient is aware that she is at an increased risk of postoperative complications and wound healing complications due to the history of radiation to the left breast.   Electronically signed by: Carola Rhine Kaysen Deal, PA-C 02/25/2021 10:31 AM

## 2021-02-25 NOTE — Progress Notes (Signed)
Patient ID: Natasha Graham, female    DOB: 03/29/1961, 60 y.o.   MRN: [id number]  Chief Complaint  Patient presents with   Pre-op Exam      ICD-10-CM   1. Malignant neoplasm of lower-outer quadrant of left breast of female, estrogen receptor positive ([place])  C50.512    Z17.0     2. S/P mastectomy, left  Z90.12     3. Acquired absence of right breast  Z90.11        History of Present Illness: Natasha Graham is a 60 y.o.  female.  She presents for preoperative evaluation for upcoming procedure, removal of left breast expander for saline implant and exchange of right saline implant, scheduled for 03/19/2021 with Dr. Marla Roe.  She has a history of nausea with anesthesia.  No other complications from anesthesia. No history of DVT/PE.  No family history of DVT/PE.  No family or personal history of bleeding or clotting disorders.  Patient is not currently taking any blood thinners.  No history of CVA/MI.   In 2015 patient underwent right mastectomy with expander placement and then had a right saline implant placed and left mastopexy for symmetry.  She subsequently was diagnosed with left lobular breast cancer and underwent left breast reconstruction.  She currently has a tissue expander in place which has 310 cc and a 455 cc expander.  She currently has a Product manager smooth round high profile saline 500 cc implant in the right breast that is filled to 600 cc.  She has a history of radiation to the left breast which was completed in June 2021.  PMH Significant for: Postoperative nausea vomiting and hypertension.  Patient reports she is comfortable with the size of the left breast and would like the right breast to match.  She would also like for the level of the inframammary folds to match.  Past Medical History: Allergies: No Known Allergies  Current Medications:  Current Outpatient Medications:    acetaminophen (TYLENOL) 500 MG tablet, Take 500 mg by mouth., Disp: , Rfl:     Ascorbic Acid (VITAMIN C) 100 MG tablet, Take 100 mg by mouth daily. 1000 mg, Disp: , Rfl:    cephALEXin (KEFLEX) 500 MG capsule, Take 1 capsule (500 mg total) by mouth 4 (four) times daily for 5 days., Disp: 20 capsule, Rfl: 0   denosumab (XGEVA) 120 MG/1.7ML SOLN injection, Inject 120 mg into the skin once. Monthly, Disp: , Rfl:    Flaxseed Oil (LINSEED OIL) OIL, by Misc.(Non-Drug; Combo Route) route., Disp: , Rfl:    Fulvestrant (FASLODEX IM), Inject into the muscle. Shot every 4 weeks., Disp: , Rfl:    Ginger, Zingiber officinalis, (GINGER PO), Take by mouth., Disp: , Rfl:    ibuprofen (ADVIL) 200 MG tablet, Take by mouth. Takes 2 of 200 mg, Disp: , Rfl:    lisinopril-hydrochlorothiazide (ZESTORETIC) 20-25 MG tablet, Take 1 tablet by mouth daily at 2 PM. Pt states lisinopril increased to 30 mg on 12-12-2020, Disp: , Rfl:    Magnesium 250 MG TABS, Take by mouth., Disp: , Rfl:    Multiple Vitamin (MULTIVITAMIN) capsule, Take by mouth., Disp: , Rfl:    ondansetron (ZOFRAN) 4 MG tablet, Take 1 tablet (4 mg total) by mouth every 8 (eight) hours as needed for nausea or vomiting., Disp: 20 tablet, Rfl: 0   OVER THE COUNTER MEDICATION, Calcium 1200mg -Take daily., Disp: , Rfl:    OVER THE COUNTER MEDICATION, Trubiotics-Take daily., Disp: , Rfl:  palbociclib (IBRANCE) 75 MG tablet, Take 1 tablet (75 mg total) by mouth daily. Take for 21 days on, 7 days off, repeat every 28 days., Disp: 21 tablet, Rfl: 6   traMADol (ULTRAM) 50 MG tablet, Take 1-2 tablets (50-100 mg total) by mouth every 6 (six) hours as needed., Disp: 20 tablet, Rfl: 0   Turmeric 500 MG CAPS, Take by mouth., Disp: , Rfl:    oxyCODONE-acetaminophen (PERCOCET/ROXICET) 5-325 MG tablet, Take 1-2 tablets by mouth every 4 (four) hours as needed for severe pain., Disp: 30 tablet, Rfl: 0  Past Medical Problems: Past Medical History:  Diagnosis Date   Cancer ([place])    breast cancer left 2021 roght 2015   Family history of brain cancer     Family history of breast cancer    Family history of colon cancer    Family history of stomach cancer    History of radiation therapy last done February 06 2020   Hypertension    PMB (postmenopausal bleeding)    PONV (postoperative nausea and vomiting)    likes scopolamine patch   Scoliosis    Wears glasses    Wears glasses     Past Surgical History: Past Surgical History:  Procedure Laterality Date   AUGMENTATION MAMMAPLASTY Right    2015 post mastectomy   AXILLARY LYMPH NODE DISSECTION Left 11/13/2019   Procedure: LEFT AXILLARY LYMPH NODE DISSECTION;  Surgeon: Rolm Bookbinder, MD;  Location: [facility];  Service: General;  Laterality: Left;   BREAST RECONSTRUCTION WITH PLACEMENT OF TISSUE EXPANDER AND FLEX HD (ACELLULAR HYDRATED DERMIS) Right 05/16/2014   Procedure: IMMEDIATE RIGHT BREAST RECONSTRUCTION WITH PLACEMENT OF TISSUE EXPANDER AND FLEX HD (ACELLULAR HYDRATED DERMIS);  Surgeon: Theodoro Kos, DO;  Location: [facility];  Service: Plastics;  Laterality: Right;   BREAST RECONSTRUCTION WITH PLACEMENT OF TISSUE EXPANDER AND FLEX HD (ACELLULAR HYDRATED DERMIS) Left 09/27/2019   Procedure: LEFT BREAST RECONSTRUCTION WITH PLACEMENT OF TISSUE EXPANDER AND FLEX HD (ACELLULAR HYDRATED DERMIS);  Surgeon: Wallace Going, DO;  Location: [facility];  Service: Plastics;  Laterality: Left;   BREAST SURGERY     right breast excisional biopsy   DILATATION & CURETTAGE/HYSTEROSCOPY WITH MYOSURE N/A 12/20/2020   Procedure: [place];  Surgeon: Brien Few, MD;  Location: [facility];  Service: Gynecology;  Laterality: N/A;   DILATION AND CURETTAGE OF UTERUS     HYSTEROSCOPY WITH D & C N/A 09/20/2015   Procedure: DILATATION AND CURETTAGE /HYSTEROSCOPY;  Surgeon: Brien Few, MD;  Location: [facility];  Service: Gynecology;  Laterality: N/A;   LIPOSUCTION Bilateral 08/08/2014   Procedure: LIPO SUCTION ;  Surgeon:  Theodoro Kos, DO;  Location: [facility];  Service: Plastics;  Laterality: Bilateral;   MASTECTOMY Right 05/16/2014   placement of acellular dermal matrix & tissue expanders    MASTOPEXY Left 08/08/2014   Procedure:  MASTOPEXY FOR SYMMETRY;  Surgeon: Theodoro Kos, DO;  Location: [facility];  Service: Plastics;  Laterality: Left;   NIPPLE SPARING MASTECTOMY WITH SENTINEL LYMPH NODE BIOPSY Left 09/27/2019   Procedure: LEFT NIPPLE SPARING MASTECTOMY WITH LEFT AXILLARY SENTINEL LYMPH NODE BIOPSY;  Surgeon: Rolm Bookbinder, MD;  Location: [facility];  Service: General;  Laterality: Left;   REDUCTION MAMMAPLASTY Left    2015   REMOVAL OF TISSUE EXPANDER AND PLACEMENT OF IMPLANT Right 08/08/2014   Procedure: REMOVAL OF RIGHT  TISSUE EXPANDERS WITH PLACEMENT OF RIGHT BREAST IMPLANTS WITH  LIPO SUCTION ;  Surgeon: Theodoro Kos, DO;  Location: [facility];  Service: Plastics;  Laterality: Right;   ROBOTIC ASSISTED TOTAL HYSTERECTOMY WITH BILATERAL SALPINGO OOPHERECTOMY Bilateral 02/06/2021   Procedure: XI ROBOTIC ASSISTED TOTAL HYSTERECTOMY WITH BILATERAL SALPINGO OOPHORECTOMY, LYSIS of SIGMOID ADHESIONS MCCALL CUL DE PLASTY;  Surgeon: Brien Few, MD;  Location: [facility];  Service: Gynecology;  Laterality: Bilateral;   SCAR REVISION Left 11/13/2019   Procedure: EXCISION OF LEFT MASTECTOMY SKIN;  Surgeon: Rolm Bookbinder, MD;  Location: [facility];  Service: General;  Laterality: Left;   scoliosis  1972   harrington rods-age 69   TONSILLECTOMY  as child    Social History: Social History   Socioeconomic History   Marital status: Married    Spouse name: Not on file   Number of children: Not on file   Years of education: Not on file   Highest education level: Not on file  Occupational History   Not on file  Tobacco Use   Smoking status: Never   Smokeless tobacco: Never  Vaping Use   Vaping Use:  Never used  Substance and Sexual Activity   Alcohol use: No   Drug use: No   Sexual activity: Not on file  Other Topics Concern   Not on file  Social History Narrative   Not on file   Social Determinants of Health   Financial Resource Strain: Not on file  Food Insecurity: Not on file  Transportation Needs: Not on file  Physical Activity: Not on file  Stress: Not on file  Social Connections: Not on file  Intimate Partner Violence: Not on file    Family History: Family History  Problem Relation Age of Onset   Heart disease Mother    Cancer Father        liver   Colon cancer Father 35   Head & neck cancer Father        oral cancer - dx in 14s-50s   Breast cancer Paternal Aunt        dx <50   Brain cancer Brother        possible meningioma   Cancer Maternal Aunt        liver/lung cancer   Stomach cancer Paternal Uncle    Heart disease Maternal Grandmother    Lymphoma Maternal Grandfather        NHL   Heart attack Paternal Grandfather    Cancer Paternal Uncle        NOS    Review of Systems: Review of Systems  Constitutional: Negative.   Respiratory: Negative.    Cardiovascular: Negative.   Gastrointestinal: Negative.   Musculoskeletal: Negative.   Neurological: Negative.    Physical Exam: Vital Signs Ht 5\' 4"  (1.626 m)   LMP 10/16/2013   BMI 29.06 kg/m   Physical Exam Constitutional:      General: Not in acute distress.    Appearance: Normal appearance. Not ill-appearing.  HENT:     Head: Normocephalic and atraumatic.  Eyes:     Pupils: Pupils are equal, round Neck:     Musculoskeletal: Normal range of motion.  Cardiovascular:     Rate and Rhythm: Normal rate    Pulses: Normal pulses.  Pulmonary:     Effort: Pulmonary effort is normal. No respiratory distress.  Abdominal:     General: Abdomen is flat. There is no distension.  Musculoskeletal: Normal range of motion.  Skin:    General: Skin is warm  and dry.     Findings: No erythema or rash.   Neurological:     General: No focal deficit present.     Mental Status: Alert and oriented to person, place, and time. Mental status is at baseline.     Motor: No weakness.  Psychiatric:        Mood and Affect: Mood normal.        Behavior: Behavior normal.    Assessment/Plan: The patient is scheduled for exchange of left breast for saline implant and exchange of right saline implant for new right saline implant with Dr. Marla Roe.  Risks, benefits, and alternatives of procedure discussed, questions answered and consent obtained.    Smoking Status: non smoker; Counseling Given? N/a  Caprini Score: 8, high; Risk Factors include: Age, BMI greater than 25, history of breast cancer, varicose veins and length of planned surgery. Recommendation for mechanical And pharmacological prophylaxis. Encourage early ambulation.   Pictures obtained: 12/10/2020  Post-op Rx sent to pharmacy: Zofran, Keflex.  Patient declined Norco prescription  Patient was provided with the General Surgical Risk consent document and Pain Medication Agreement prior to their appointment.  They had adequate time to read through the risk consent documents and Pain Medication Agreement. We also discussed them in person together during this preop appointment. All of their questions were answered to their satisfaction.  Recommended calling if they have any further questions.  Risk consent form and Pain Medication Agreement to be scanned into patient's chart.  The risks that can be encountered with and after placement of a breast implant were discussed and include the following but not limited to these: bleeding, infection, delayed healing, anesthesia risks, skin sensation changes, injury to structures including nerves, blood vessels, and muscles which may be temporary or permanent, allergies to tape, suture materials and glues, blood products, topical preparations or injected agents, skin contour irregularities, skin discoloration  and swelling, deep vein thrombosis, cardiac and pulmonary complications, pain, which may persist, fluid accumulation, wrinkling of the skin over the implanmt, changes in nipple or breast sensation, implant leakage or rupture, faulty position of the implant, persistent pain, formation of tight scar tissue around the implant (capsular contracture).  Patient was provided with the [facility] implant patient decision checklist and this was completed during today's preoperative evaluation. Patient had time to read through the information and any questions were answered to their content. Form will be scanned into patient's chart.  Patient is aware that she is at an increased risk of postoperative complications and wound healing complications due to the history of radiation to the left breast.   Electronically signed by: Carola Rhine Aarionna Germer, PA-C 02/25/2021 10:31 AM

## 2021-02-26 ENCOUNTER — Inpatient Hospital Stay: Payer: BC Managed Care – PPO

## 2021-02-26 ENCOUNTER — Other Ambulatory Visit (HOSPITAL_COMMUNITY): Payer: Self-pay

## 2021-02-26 VITALS — BP 164/88 | HR 86 | Temp 98.6°F | Resp 16

## 2021-02-26 DIAGNOSIS — K760 Fatty (change of) liver, not elsewhere classified: Secondary | ICD-10-CM

## 2021-02-26 DIAGNOSIS — Z79899 Other long term (current) drug therapy: Secondary | ICD-10-CM | POA: Diagnosis not present

## 2021-02-26 DIAGNOSIS — Z170 Estrogen receptor positive status [ER+]: Secondary | ICD-10-CM

## 2021-02-26 DIAGNOSIS — C7951 Secondary malignant neoplasm of bone: Secondary | ICD-10-CM | POA: Diagnosis not present

## 2021-02-26 DIAGNOSIS — Z923 Personal history of irradiation: Secondary | ICD-10-CM | POA: Diagnosis not present

## 2021-02-26 DIAGNOSIS — C50512 Malignant neoplasm of lower-outer quadrant of left female breast: Secondary | ICD-10-CM

## 2021-02-26 DIAGNOSIS — C50812 Malignant neoplasm of overlapping sites of left female breast: Secondary | ICD-10-CM | POA: Diagnosis not present

## 2021-02-26 DIAGNOSIS — I700 Atherosclerosis of aorta: Secondary | ICD-10-CM

## 2021-02-26 DIAGNOSIS — C50912 Malignant neoplasm of unspecified site of left female breast: Secondary | ICD-10-CM

## 2021-02-26 DIAGNOSIS — Z9013 Acquired absence of bilateral breasts and nipples: Secondary | ICD-10-CM | POA: Diagnosis not present

## 2021-02-26 LAB — CBC WITH DIFFERENTIAL/PLATELET
Abs Immature Granulocytes: 0.01 10*3/uL (ref 0.00–0.07)
Basophils Absolute: 0 10*3/uL (ref 0.0–0.1)
Basophils Relative: 1 %
Eosinophils Absolute: 0.1 10*3/uL (ref 0.0–0.5)
Eosinophils Relative: 3 %
HCT: 34.2 % — ABNORMAL LOW (ref 36.0–46.0)
Hemoglobin: 12.4 g/dL (ref 12.0–15.0)
Immature Granulocytes: 0 %
Lymphocytes Relative: 28 %
Lymphs Abs: 0.8 10*3/uL (ref 0.7–4.0)
MCH: 36.5 pg — ABNORMAL HIGH (ref 26.0–34.0)
MCHC: 36.3 g/dL — ABNORMAL HIGH (ref 30.0–36.0)
MCV: 100.6 fL — ABNORMAL HIGH (ref 80.0–100.0)
Monocytes Absolute: 0.2 10*3/uL (ref 0.1–1.0)
Monocytes Relative: 7 %
Neutro Abs: 1.8 10*3/uL (ref 1.7–7.7)
Neutrophils Relative %: 61 %
Platelets: 242 10*3/uL (ref 150–400)
RBC: 3.4 MIL/uL — ABNORMAL LOW (ref 3.87–5.11)
RDW: 11.4 % — ABNORMAL LOW (ref 11.5–15.5)
WBC: 3 10*3/uL — ABNORMAL LOW (ref 4.0–10.5)
nRBC: 0 % (ref 0.0–0.2)

## 2021-02-26 LAB — COMPREHENSIVE METABOLIC PANEL
ALT: 16 U/L (ref 0–44)
AST: 13 U/L — ABNORMAL LOW (ref 15–41)
Albumin: 3.7 g/dL (ref 3.5–5.0)
Alkaline Phosphatase: 56 U/L (ref 38–126)
Anion gap: 11 (ref 5–15)
BUN: 10 mg/dL (ref 8–23)
CO2: 28 mmol/L (ref 22–32)
Calcium: 9.4 mg/dL (ref 8.9–10.3)
Chloride: 102 mmol/L (ref 98–111)
Creatinine, Ser: 0.83 mg/dL (ref 0.44–1.00)
GFR, Estimated: >60 mL/min (ref >60)
Glucose, Bld: 87 mg/dL (ref 70–99)
Potassium: 3.8 mmol/L (ref 3.5–5.1)
Sodium: 141 mmol/L (ref 135–145)
Total Bilirubin: 0.5 mg/dL (ref 0.3–1.2)
Total Protein: 7 g/dL (ref 6.5–8.1)

## 2021-02-26 MED ORDER — FULVESTRANT 250 MG/5ML IM SOLN
500.0000 mg | Freq: Once | INTRAMUSCULAR | Status: AC
  Administered 2021-02-26: 500 mg via INTRAMUSCULAR

## 2021-02-26 MED ORDER — FULVESTRANT 250 MG/5ML IM SOLN
INTRAMUSCULAR | Status: AC
  Filled 2021-02-26: qty 10

## 2021-02-26 MED ORDER — DENOSUMAB 120 MG/1.7ML ~~LOC~~ SOLN
SUBCUTANEOUS | Status: AC
  Filled 2021-02-26: qty 1.7

## 2021-02-26 MED ORDER — DENOSUMAB 120 MG/1.7ML ~~LOC~~ SOLN
120.0000 mg | Freq: Once | SUBCUTANEOUS | Status: AC
  Administered 2021-02-26: 120 mg via SUBCUTANEOUS

## 2021-02-26 NOTE — Patient Instructions (Signed)
Denosumab injection What is this medication? DENOSUMAB (den oh sue mab) slows bone breakdown. Prolia is used to treat osteoporosis in women after menopause and in men, and in people who are taking corticosteroids for 6 months or more. Delton See is used to treat a high calcium level due to cancer and to prevent bone fractures and other bone problems caused by multiple myeloma or cancer bone metastases. Delton See is also used totreat giant cell tumor of the bone. This medicine may be used for other purposes; ask your health care provider orpharmacist if you have questions. COMMON BRAND NAME(S): Prolia, XGEVA What should I tell my care team before I take this medication? They need to know if you have any of these conditions: dental disease having surgery or tooth extraction infection kidney disease low levels of calcium or Vitamin D in the blood malnutrition on hemodialysis skin conditions or sensitivity thyroid or parathyroid disease an unusual reaction to denosumab, other medicines, foods, dyes, or preservatives pregnant or trying to get pregnant breast-feeding How should I use this medication? This medicine is for injection under the skin. It is given by a health careprofessional in a hospital or clinic setting. A special MedGuide will be given to you before each treatment. Be sure to readthis information carefully each time. For Prolia, talk to your pediatrician regarding the use of this medicine in children. Special care may be needed. For Delton See, talk to your pediatrician regarding the use of this medicine in children. While this drug may be prescribed for children as young as 13 years for selected conditions,precautions do apply. Overdosage: If you think you have taken too much of this medicine contact apoison control center or emergency room at once. NOTE: This medicine is only for you. Do not share this medicine with others. What if I miss a dose? It is important not to miss your dose. Call  your doctor or health careprofessional if you are unable to keep an appointment. What may interact with this medication? Do not take this medicine with any of the following medications: other medicines containing denosumab This medicine may also interact with the following medications: medicines that lower your chance of fighting infection steroid medicines like prednisone or cortisone This list may not describe all possible interactions. Give your health care provider a list of all the medicines, herbs, non-prescription drugs, or dietary supplements you use. Also tell them if you smoke, drink alcohol, or use illegaldrugs. Some items may interact with your medicine. What should I watch for while using this medication? Visit your doctor or health care professional for regular checks on your progress. Your doctor or health care professional may order blood tests andother tests to see how you are doing. Call your doctor or health care professional for advice if you get a fever, chills or sore throat, or other symptoms of a cold or flu. Do not treat yourself. This drug may decrease your body's ability to fight infection. Try toavoid being around people who are sick. You should make sure you get enough calcium and vitamin D while you are taking this medicine, unless your doctor tells you not to. Discuss the foods you eatand the vitamins you take with your health care professional. See your dentist regularly. Brush and floss your teeth as directed. Before youhave any dental work done, tell your dentist you are receiving this medicine. Do not become pregnant while taking this medicine or for 5 months after stopping it. Talk with your doctor or health care professional about your  birth control options while taking this medicine. Women should inform their doctor if they wish to become pregnant or think they might be pregnant. There is a potential for serious side effects to an unborn child. Talk to your health  careprofessional or pharmacist for more information. What side effects may I notice from receiving this medication? Side effects that you should report to your doctor or health care professionalas soon as possible: allergic reactions like skin rash, itching or hives, swelling of the face, lips, or tongue bone pain breathing problems dizziness jaw pain, especially after dental work redness, blistering, peeling of the skin signs and symptoms of infection like fever or chills; cough; sore throat; pain or trouble passing urine signs of low calcium like fast heartbeat, muscle cramps or muscle pain; pain, tingling, numbness in the hands or feet; seizures unusual bleeding or bruising unusually weak or tired Side effects that usually do not require medical attention (report to yourdoctor or health care professional if they continue or are bothersome): constipation diarrhea headache joint pain loss of appetite muscle pain runny nose tiredness upset stomach This list may not describe all possible side effects. Call your doctor for medical advice about side effects. You may report side effects to FDA at1-800-FDA-1088. Where should I keep my medication? This medicine is only given in a clinic, doctor's office, or other health caresetting and will not be stored at home. NOTE: This sheet is a summary. It may not cover all possible information. If you have questions about this medicine, talk to your doctor, pharmacist, orhealth care provider.  Fulvestrant injection What is this medication? FULVESTRANT (ful VES trant) blocks the effects of estrogen. It is used to treatbreast cancer. This medicine may be used for other purposes; ask your health care provider orpharmacist if you have questions. COMMON BRAND NAME(S): FASLODEX What should I tell my care team before I take this medication? They need to know if you have any of these conditions: bleeding disorders liver disease low blood counts, like  low white cell, platelet, or red cell counts an unusual or allergic reaction to fulvestrant, other medicines, foods, dyes, or preservatives pregnant or trying to get pregnant breast-feeding How should I use this medication? This medicine is for injection into a muscle. It is usually given by a healthcare professional in a hospital or clinic setting. Talk to your pediatrician regarding the use of this medicine in children.Special care may be needed. Overdosage: If you think you have taken too much of this medicine contact apoison control center or emergency room at once. NOTE: This medicine is only for you. Do not share this medicine with others. What if I miss a dose? It is important not to miss your dose. Call your doctor or health careprofessional if you are unable to keep an appointment. What may interact with this medication? medicines that treat or prevent blood clots like warfarin, enoxaparin, dalteparin, apixaban, dabigatran, and rivaroxaban This list may not describe all possible interactions. Give your health care provider a list of all the medicines, herbs, non-prescription drugs, or dietary supplements you use. Also tell them if you smoke, drink alcohol, or use illegaldrugs. Some items may interact with your medicine. What should I watch for while using this medication? Your condition will be monitored carefully while you are receiving this medicine. You will need important blood work done while you are taking thismedicine. Do not become pregnant while taking this medicine or for at least 1 year after stopping it. Women of child-bearing potential  will need to have a negative pregnancy test before starting this medicine. Women should inform their doctor if they wish to become pregnant or think they might be pregnant. There is a potential for serious side effects to an unborn child. Men should inform their doctors if they wish to father a child. This medicine may lower sperm counts. Talk to  your health care professional or pharmacist for more information. Do not breast-feed an infant while taking this medicine or for 1 year after thelast dose. What side effects may I notice from receiving this medication? Side effects that you should report to your doctor or health care professionalas soon as possible: allergic reactions like skin rash, itching or hives, swelling of the face, lips, or tongue feeling faint or lightheaded, falls pain, tingling, numbness, or weakness in the legs signs and symptoms of infection like fever or chills; cough; flu-like symptoms; sore throat vaginal bleeding Side effects that usually do not require medical attention (report to yourdoctor or health care professional if they continue or are bothersome): aches, pains constipation diarrhea headache hot flashes nausea, vomiting pain at site where injected stomach pain This list may not describe all possible side effects. Call your doctor for medical advice about side effects. You may report side effects to FDA at1-800-FDA-1088. Where should I keep my medication? This drug is given in a hospital or clinic and will not be stored at home. NOTE: This sheet is a summary. It may not cover all possible information. If you have questions about this medicine, talk to your doctor, pharmacist, orhealth care provider.  2022 Elsevier/Gold Standard (2017-11-25 11:34:41)   2022 Elsevier/Gold Standard (2017-12-24 16:10:44)

## 2021-02-27 LAB — CANCER ANTIGEN 27.29: CA 27.29: 54.3 U/mL — ABNORMAL HIGH (ref 0.0–38.6)

## 2021-03-07 ENCOUNTER — Encounter: Payer: BC Managed Care – PPO | Admitting: Plastic Surgery

## 2021-03-12 ENCOUNTER — Encounter (HOSPITAL_BASED_OUTPATIENT_CLINIC_OR_DEPARTMENT_OTHER): Payer: Self-pay | Admitting: Plastic Surgery

## 2021-03-18 ENCOUNTER — Telehealth: Payer: Self-pay | Admitting: Plastic Surgery

## 2021-03-18 ENCOUNTER — Other Ambulatory Visit (HOSPITAL_COMMUNITY)
Admission: RE | Admit: 2021-03-18 | Discharge: 2021-03-18 | Disposition: A | Discharge status: Home / Self Care | Payer: BC Managed Care – PPO | Source: Ambulatory Visit | Attending: Plastic Surgery | Admitting: Plastic Surgery

## 2021-03-18 DIAGNOSIS — Z808 Family history of malignant neoplasm of other organs or systems: Secondary | ICD-10-CM | POA: Diagnosis not present

## 2021-03-18 DIAGNOSIS — Z79899 Other long term (current) drug therapy: Secondary | ICD-10-CM | POA: Diagnosis not present

## 2021-03-18 DIAGNOSIS — Z803 Family history of malignant neoplasm of breast: Secondary | ICD-10-CM | POA: Diagnosis not present

## 2021-03-18 DIAGNOSIS — Z20822 Contact with and (suspected) exposure to covid-19: Secondary | ICD-10-CM | POA: Insufficient documentation

## 2021-03-18 DIAGNOSIS — Z800 Family history of malignant neoplasm of digestive organs: Secondary | ICD-10-CM | POA: Diagnosis not present

## 2021-03-18 DIAGNOSIS — Z9013 Acquired absence of bilateral breasts and nipples: Secondary | ICD-10-CM | POA: Diagnosis not present

## 2021-03-18 DIAGNOSIS — Z01812 Encounter for preprocedural laboratory examination: Secondary | ICD-10-CM | POA: Insufficient documentation

## 2021-03-18 DIAGNOSIS — Z923 Personal history of irradiation: Secondary | ICD-10-CM | POA: Diagnosis not present

## 2021-03-18 DIAGNOSIS — Z853 Personal history of malignant neoplasm of breast: Secondary | ICD-10-CM | POA: Diagnosis not present

## 2021-03-18 DIAGNOSIS — Z421 Encounter for breast reconstruction following mastectomy: Secondary | ICD-10-CM | POA: Diagnosis not present

## 2021-03-18 DIAGNOSIS — Z8249 Family history of ischemic heart disease and other diseases of the circulatory system: Secondary | ICD-10-CM | POA: Diagnosis not present

## 2021-03-18 LAB — SARS CORONAVIRUS 2 (TAT 6-24 HRS): SARS Coronavirus 2: NEGATIVE

## 2021-03-18 NOTE — Telephone Encounter (Signed)
Natasha Graham called to advise that she has had a sore throat for the past several nights - seems to be ok during the daytime She is scheduled for surgery tomorrow. Patient indicated she called [facility] and was told to call our office. Per our PA, [employer], we are sending her for a covid test this morning. The results should be completed overnight, per [name] at [facility]. We will update the patient accordingly.

## 2021-03-19 ENCOUNTER — Other Ambulatory Visit: Payer: Self-pay

## 2021-03-19 ENCOUNTER — Ambulatory Visit (HOSPITAL_BASED_OUTPATIENT_CLINIC_OR_DEPARTMENT_OTHER): Payer: BC Managed Care – PPO | Admitting: Anesthesiology

## 2021-03-19 ENCOUNTER — Ambulatory Visit (HOSPITAL_BASED_OUTPATIENT_CLINIC_OR_DEPARTMENT_OTHER)
Admission: RE | Admit: 2021-03-19 | Discharge: 2021-03-19 | Disposition: A | Discharge status: Home / Self Care | Payer: BC Managed Care – PPO | Attending: Plastic Surgery | Admitting: Plastic Surgery

## 2021-03-19 ENCOUNTER — Encounter (HOSPITAL_BASED_OUTPATIENT_CLINIC_OR_DEPARTMENT_OTHER): Payer: Self-pay | Admitting: Plastic Surgery

## 2021-03-19 ENCOUNTER — Encounter (HOSPITAL_BASED_OUTPATIENT_CLINIC_OR_DEPARTMENT_OTHER)
Admission: RE | Disposition: A | Discharge status: Home / Self Care | Payer: Self-pay | Source: Home / Self Care | Attending: Plastic Surgery

## 2021-03-19 ENCOUNTER — Other Ambulatory Visit (HOSPITAL_COMMUNITY): Payer: Self-pay

## 2021-03-19 DIAGNOSIS — Z853 Personal history of malignant neoplasm of breast: Secondary | ICD-10-CM | POA: Diagnosis not present

## 2021-03-19 DIAGNOSIS — Z45811 Encounter for adjustment or removal of right breast implant: Secondary | ICD-10-CM | POA: Diagnosis not present

## 2021-03-19 DIAGNOSIS — Z79899 Other long term (current) drug therapy: Secondary | ICD-10-CM | POA: Diagnosis not present

## 2021-03-19 DIAGNOSIS — Z803 Family history of malignant neoplasm of breast: Secondary | ICD-10-CM | POA: Insufficient documentation

## 2021-03-19 DIAGNOSIS — Z9013 Acquired absence of bilateral breasts and nipples: Secondary | ICD-10-CM | POA: Insufficient documentation

## 2021-03-19 DIAGNOSIS — Z421 Encounter for breast reconstruction following mastectomy: Secondary | ICD-10-CM | POA: Insufficient documentation

## 2021-03-19 DIAGNOSIS — Z45812 Encounter for adjustment or removal of left breast implant: Secondary | ICD-10-CM | POA: Diagnosis not present

## 2021-03-19 DIAGNOSIS — Z9889 Other specified postprocedural states: Secondary | ICD-10-CM | POA: Diagnosis not present

## 2021-03-19 DIAGNOSIS — Z20822 Contact with and (suspected) exposure to covid-19: Secondary | ICD-10-CM | POA: Diagnosis not present

## 2021-03-19 DIAGNOSIS — Z8249 Family history of ischemic heart disease and other diseases of the circulatory system: Secondary | ICD-10-CM | POA: Diagnosis not present

## 2021-03-19 DIAGNOSIS — Z923 Personal history of irradiation: Secondary | ICD-10-CM | POA: Diagnosis not present

## 2021-03-19 DIAGNOSIS — C50512 Malignant neoplasm of lower-outer quadrant of left female breast: Secondary | ICD-10-CM | POA: Diagnosis not present

## 2021-03-19 DIAGNOSIS — Z9011 Acquired absence of right breast and nipple: Secondary | ICD-10-CM | POA: Diagnosis not present

## 2021-03-19 DIAGNOSIS — Z800 Family history of malignant neoplasm of digestive organs: Secondary | ICD-10-CM | POA: Diagnosis not present

## 2021-03-19 DIAGNOSIS — Z9012 Acquired absence of left breast and nipple: Secondary | ICD-10-CM | POA: Diagnosis not present

## 2021-03-19 DIAGNOSIS — Z170 Estrogen receptor positive status [ER+]: Secondary | ICD-10-CM | POA: Diagnosis not present

## 2021-03-19 DIAGNOSIS — Z808 Family history of malignant neoplasm of other organs or systems: Secondary | ICD-10-CM | POA: Diagnosis not present

## 2021-03-19 HISTORY — PX: REMOVAL OF TISSUE EXPANDER AND PLACEMENT OF IMPLANT: SHX6457

## 2021-03-19 HISTORY — PX: BREAST IMPLANT EXCHANGE: SHX6296

## 2021-03-19 SURGERY — REMOVAL, TISSUE EXPANDER, BREAST, WITH IMPLANT INSERTION
Anesthesia: General | Site: Breast | Laterality: Right

## 2021-03-19 MED ORDER — MIDAZOLAM HCL 5 MG/5ML IJ SOLN
INTRAMUSCULAR | Status: DC | PRN
  Administered 2021-03-19: 2 mg via INTRAVENOUS

## 2021-03-19 MED ORDER — ONDANSETRON HCL 4 MG/2ML IJ SOLN: 4.0000 mg | Freq: Once | INTRAMUSCULAR | Status: DC | PRN

## 2021-03-19 MED ORDER — DEXAMETHASONE SODIUM PHOSPHATE 10 MG/ML IJ SOLN
INTRAMUSCULAR | Status: AC
  Filled 2021-03-19: qty 1

## 2021-03-19 MED ORDER — LIDOCAINE HCL (PF) 2 % IJ SOLN
INTRAMUSCULAR | Status: AC
  Filled 2021-03-19: qty 5

## 2021-03-19 MED ORDER — CEFAZOLIN SODIUM-DEXTROSE 2-4 GM/100ML-% IV SOLN
2.0000 g | INTRAVENOUS | Status: AC
Start: 2021-03-19 — End: 2021-03-19
  Administered 2021-03-19: 2 g via INTRAVENOUS

## 2021-03-19 MED ORDER — DEXAMETHASONE SODIUM PHOSPHATE 10 MG/ML IJ SOLN
INTRAMUSCULAR | Status: DC | PRN
  Administered 2021-03-19: 10 mg via INTRAVENOUS

## 2021-03-19 MED ORDER — OXYCODONE HCL 5 MG PO TABS
5.0000 mg | ORAL_TABLET | Freq: Once | ORAL | Status: AC | PRN
Start: 2021-03-19 — End: 2021-03-19
  Administered 2021-03-19: 5 mg via ORAL

## 2021-03-19 MED ORDER — SODIUM CHLORIDE 0.9 % IV SOLN
INTRAVENOUS | Status: AC
  Filled 2021-03-19: qty 10

## 2021-03-19 MED ORDER — ONDANSETRON HCL 4 MG/2ML IJ SOLN
INTRAMUSCULAR | Status: DC | PRN
  Administered 2021-03-19: 4 mg via INTRAVENOUS

## 2021-03-19 MED ORDER — PHENYLEPHRINE HCL (PRESSORS) 10 MG/ML IV SOLN
INTRAVENOUS | Status: DC | PRN
  Administered 2021-03-19: 80 ug via INTRAVENOUS

## 2021-03-19 MED ORDER — SCOPOLAMINE 1 MG/3DAYS TD PT72
MEDICATED_PATCH | TRANSDERMAL | Status: AC
  Filled 2021-03-19: qty 1

## 2021-03-19 MED ORDER — OXYCODONE HCL 5 MG PO TABS
ORAL_TABLET | ORAL | Status: AC
  Filled 2021-03-19: qty 1

## 2021-03-19 MED ORDER — LIDOCAINE HCL (CARDIAC) PF 100 MG/5ML IV SOSY
PREFILLED_SYRINGE | INTRAVENOUS | Status: DC | PRN
  Administered 2021-03-19: 100 mg via INTRATRACHEAL

## 2021-03-19 MED ORDER — SODIUM CHLORIDE 0.9 % IV SOLN
INTRAVENOUS | Status: DC | PRN
  Administered 2021-03-19: 500 mL

## 2021-03-19 MED ORDER — FENTANYL CITRATE (PF) 100 MCG/2ML IJ SOLN
25.0000 ug | INTRAMUSCULAR | Status: DC | PRN
  Administered 2021-03-19 (×2): 50 ug via INTRAVENOUS

## 2021-03-19 MED ORDER — LIDOCAINE-EPINEPHRINE 1 %-1:100000 IJ SOLN
INTRAMUSCULAR | Status: AC
  Filled 2021-03-19: qty 1

## 2021-03-19 MED ORDER — PROPOFOL 10 MG/ML IV BOLUS
INTRAVENOUS | Status: DC | PRN
  Administered 2021-03-19: 70 mg via INTRAVENOUS
  Administered 2021-03-19: 130 mg via INTRAVENOUS

## 2021-03-19 MED ORDER — FENTANYL CITRATE (PF) 100 MCG/2ML IJ SOLN
INTRAMUSCULAR | Status: AC
  Filled 2021-03-19: qty 2

## 2021-03-19 MED ORDER — CEFAZOLIN SODIUM-DEXTROSE 2-4 GM/100ML-% IV SOLN
INTRAVENOUS | Status: AC
  Filled 2021-03-19: qty 100

## 2021-03-19 MED ORDER — CHLORHEXIDINE GLUCONATE CLOTH 2 % EX PADS: 6.0000 | MEDICATED_PAD | Freq: Once | CUTANEOUS | Status: DC

## 2021-03-19 MED ORDER — PROPOFOL 10 MG/ML IV BOLUS
INTRAVENOUS | Status: AC
  Filled 2021-03-19: qty 20

## 2021-03-19 MED ORDER — ONDANSETRON HCL 4 MG/2ML IJ SOLN
INTRAMUSCULAR | Status: AC
  Filled 2021-03-19: qty 2

## 2021-03-19 MED ORDER — OXYCODONE HCL 5 MG/5ML PO SOLN: 5.0000 mg | Freq: Once | ORAL | Status: AC | PRN

## 2021-03-19 MED ORDER — AMISULPRIDE (ANTIEMETIC) 5 MG/2ML IV SOLN: 10.0000 mg | Freq: Once | INTRAVENOUS | Status: DC | PRN

## 2021-03-19 MED ORDER — LACTATED RINGERS IV SOLN: INTRAVENOUS | Status: DC

## 2021-03-19 MED ORDER — FENTANYL CITRATE (PF) 100 MCG/2ML IJ SOLN
INTRAMUSCULAR | Status: DC | PRN
  Administered 2021-03-19 (×6): 25 ug via INTRAVENOUS
  Administered 2021-03-19: 50 ug via INTRAVENOUS

## 2021-03-19 MED ORDER — LIDOCAINE-EPINEPHRINE 1 %-1:100000 IJ SOLN
INTRAMUSCULAR | Status: DC | PRN
  Administered 2021-03-19: 15 mL via INTRAMUSCULAR

## 2021-03-19 MED ORDER — SCOPOLAMINE 1 MG/3DAYS TD PT72
1.0000 | MEDICATED_PATCH | TRANSDERMAL | Status: DC
  Administered 2021-03-19: 1.5 mg via TRANSDERMAL

## 2021-03-19 MED ORDER — MIDAZOLAM HCL 2 MG/2ML IJ SOLN
INTRAMUSCULAR | Status: AC
  Filled 2021-03-19: qty 2

## 2021-03-19 SURGICAL SUPPLY — 74 items
ADH SKN CLS APL DERMABOND .7 (GAUZE/BANDAGES/DRESSINGS)
BAG DECANTER FOR FLEXI CONT (MISCELLANEOUS) ×3 IMPLANT
BINDER BREAST LRG (GAUZE/BANDAGES/DRESSINGS) ×3 IMPLANT
BINDER BREAST MEDIUM (GAUZE/BANDAGES/DRESSINGS) IMPLANT
BINDER BREAST XLRG (GAUZE/BANDAGES/DRESSINGS) IMPLANT
BINDER BREAST XXLRG (GAUZE/BANDAGES/DRESSINGS) IMPLANT
BIOPATCH RED 1 DISK 7.0 (GAUZE/BANDAGES/DRESSINGS) IMPLANT
BLADE HEX COATED 2.75 (ELECTRODE) ×3 IMPLANT
BLADE SURG 15 STRL LF DISP TIS (BLADE) ×2 IMPLANT
BLADE SURG 15 STRL SS (BLADE) ×3
BNDG GAUZE ELAST 4 BULKY (GAUZE/BANDAGES/DRESSINGS) ×6 IMPLANT
CANISTER SUCT 1200ML W/VALVE (MISCELLANEOUS) ×3 IMPLANT
COVER BACK TABLE 60X90IN (DRAPES) ×3 IMPLANT
COVER MAYO STAND STRL (DRAPES) ×3 IMPLANT
DECANTER SPIKE VIAL GLASS SM (MISCELLANEOUS) IMPLANT
DERMABOND ADVANCED (GAUZE/BANDAGES/DRESSINGS)
DERMABOND ADVANCED .7 DNX12 (GAUZE/BANDAGES/DRESSINGS) IMPLANT
DRAIN CHANNEL 19F RND (DRAIN) IMPLANT
DRAPE LAPAROSCOPIC ABDOMINAL (DRAPES) ×3 IMPLANT
DRSG PAD ABDOMINAL 8X10 ST (GAUZE/BANDAGES/DRESSINGS) ×6 IMPLANT
DRSG TEGADERM 2-3/8X2-3/4 SM (GAUZE/BANDAGES/DRESSINGS) IMPLANT
ELECT BLADE 4.0 EZ CLEAN MEGAD (MISCELLANEOUS) ×6
ELECT COATED BLADE 2.86 ST (ELECTRODE) ×3 IMPLANT
ELECT REM PT RETURN 9FT ADLT (ELECTROSURGICAL) ×3
ELECTRODE BLDE 4.0 EZ CLN MEGD (MISCELLANEOUS) ×4 IMPLANT
ELECTRODE REM PT RTRN 9FT ADLT (ELECTROSURGICAL) ×2 IMPLANT
EVACUATOR SILICONE 100CC (DRAIN) IMPLANT
FUNNEL KELLER 2 DISP (MISCELLANEOUS) IMPLANT
GAUZE SPONGE 4X4 12PLY STRL LF (GAUZE/BANDAGES/DRESSINGS) IMPLANT
GLOVE SURG ENC MOIS LTX SZ6 (GLOVE) ×6 IMPLANT
GOWN STRL REUS W/ TWL LRG LVL3 (GOWN DISPOSABLE) ×4 IMPLANT
GOWN STRL REUS W/TWL LRG LVL3 (GOWN DISPOSABLE) ×6
IMPL BREAST SALINE HP 290CC (Breast) ×2 IMPLANT
IMPL BREAST SALINE HP 380CC (Breast) ×2 IMPLANT
IMPLANT BREAST SALINE HP 290CC (Breast) ×3 IMPLANT
IMPLANT BREAST SALINE HP 380CC (Breast) ×3 IMPLANT
IV NS 1000ML (IV SOLUTION)
IV NS 1000ML BAXH (IV SOLUTION) IMPLANT
IV NS 500ML (IV SOLUTION) ×3
IV NS 500ML BAXH (IV SOLUTION) ×2 IMPLANT
KIT FILL SYSTEM UNIVERSAL (SET/KITS/TRAYS/PACK) IMPLANT
NDL SAFETY ECLIPSE 18X1.5 (NEEDLE) ×2 IMPLANT
NEEDLE HYPO 18GX1.5 SHARP (NEEDLE) ×3
NEEDLE HYPO 25X1 1.5 SAFETY (NEEDLE) ×3 IMPLANT
PACK BASIN DAY SURGERY FS (CUSTOM PROCEDURE TRAY) ×3 IMPLANT
PENCIL SMOKE EVACUATOR (MISCELLANEOUS) ×3 IMPLANT
PIN SAFETY STERILE (MISCELLANEOUS) IMPLANT
SIZER BREAST 330CC (SIZER) ×3
SIZER BREAST SGL 420CC (SIZER) ×2 IMPLANT
SIZER BREAST SGL USE 420CC (SIZER) ×3
SIZER BRST P4.8-6.2XHI 330CC (SIZER) ×2 IMPLANT
SLEEVE SCD COMPRESS KNEE MED (STOCKING) ×3 IMPLANT
SPONGE T-LAP 18X18 ~~LOC~~+RFID (SPONGE) ×6 IMPLANT
STRIP SUTURE WOUND CLOSURE 1/2 (MISCELLANEOUS) IMPLANT
SUT MNCRL AB 3-0 PS2 18 (SUTURE) IMPLANT
SUT MNCRL AB 4-0 PS2 18 (SUTURE) ×6 IMPLANT
SUT MON AB 3-0 SH 27 (SUTURE) ×6
SUT MON AB 3-0 SH27 (SUTURE) ×4 IMPLANT
SUT MON AB 5-0 PS2 18 (SUTURE) ×3 IMPLANT
SUT PDS 3-0 CT2 (SUTURE) ×9
SUT PDS AB 2-0 CT2 27 (SUTURE) ×3 IMPLANT
SUT PDS II 3-0 CT2 27 ABS (SUTURE) ×6 IMPLANT
SUT SILK 3 0 PS 1 (SUTURE) IMPLANT
SUT VIC AB 3-0 SH 27 (SUTURE)
SUT VIC AB 3-0 SH 27X BRD (SUTURE) IMPLANT
SUT VICRYL 4-0 PS2 18IN ABS (SUTURE) ×3 IMPLANT
SYR 50ML LL SCALE MARK (SYRINGE) IMPLANT
SYR BULB IRRIG 60ML STRL (SYRINGE) ×3 IMPLANT
SYR CONTROL 10ML LL (SYRINGE) IMPLANT
TOWEL GREEN STERILE FF (TOWEL DISPOSABLE) ×6 IMPLANT
TRAY DSU PREP LF (CUSTOM PROCEDURE TRAY) ×3 IMPLANT
TUBE CONNECTING 20X1/4 (TUBING) ×3 IMPLANT
UNDERPAD 30X36 HEAVY ABSORB (UNDERPADS AND DIAPERS) ×6 IMPLANT
YANKAUER SUCT BULB TIP NO VENT (SUCTIONS) ×3 IMPLANT

## 2021-03-19 NOTE — Op Note (Addendum)
Op report Bilateral Exchange   DATE OF OPERATION: 03/19/2021  LOCATION: [place]  SURGICAL DIVISION: Plastic Surgery  PREOPERATIVE DIAGNOSIS:  1. History of breast cancer.  2. Acquired absence of bilateral breast.   POSTOPERATIVE DIAGNOSIS:  1. History of breast cancer.  2. Acquired absence of bilateral breast.   PROCEDURE:  1. Bilateral exchange of tissue expanders for implants.  2. Bilateral capsulotomies for implant respositioning.  SURGEON: Margues Filippini Sanger Sheralyn Pinegar, DO  ASSISTANT: Roetta Sessions, PA  ANESTHESIA:  General.   COMPLICATIONS: None.   IMPLANTS: Left - Mentor Smooth Round High Profile Saline 290 - 350 cc. Ref #[id number].  Serial Number [id number], 350 cc Right - Mentor Smooth Round High Profile Saline 380 - 450 cc. Ref #[id number].  Serial Number [id number] 450 cc  INDICATIONS FOR PROCEDURE:  The patient, Natasha Graham, is a 60 y.o. female born on [birth year], is here for treatment after bilateral mastectomies.  She had a tissue expander placed at the time of mastectomy on the left side. She had previous right breast cancer with expansion and has an implant in place.  She now presents for exchange of her left expander and right implant for implants.  She requires capsulotomies to better position the implants.  She had left sided radiation. MRN: [id number]  CONSENT:  Informed consent was obtained directly from the patient. Risks, benefits and alternatives were fully discussed. Specific risks including but not limited to bleeding, infection, hematoma, seroma, scarring, pain, implant infection, implant extrusion, capsular contracture, asymmetry, wound healing problems, and need for further surgery were all discussed. The patient did have an ample opportunity to have her questions answered to her satisfaction.   DESCRIPTION OF PROCEDURE:  The patient was taken to the operating room. SCDs were placed and IV antibiotics were given. The  patient's chest was prepped and draped in a sterile fashion. A time out was performed and the implants to be used were identified.    On the right breast: One percent Lidocaine with epinephrine was used to infiltrate at the incision site. The old mastectomy scar was incised at the inframammary fold.  The mastectomy flaps from the superior and inferior flaps were raised over the capsule for several centimeters to minimize tension for the closure. The capsule was split inferior to the skin incision to expose and remove the saline implant.  Inspection of the pocket showed a normal healthy capsule but it was lateral.  A 1.5 cm area of capsule was excised and repositioned medially with the 2-0 PDS.  The pocket was irrigated with antibiotic solution. Measurements were made and a sizer used to confirm adequate pocket size for the implant dimensions.  Hemostasis was ensured with electrocautery. New gloves were placed. The implant was soaked in antibiotic solution and then placed in the pocket and oriented appropriately and expanded to 450 cc with saline. The capsule on the anterior surface was closed with a 3-0 PDS suture. The remaining skin was closed with 3-0 Monocryl deep dermal and 4-0 Monocryl subcuticular stitches.   On the left breast: The old mastectomy scar was incised at the inframammary fold.  The mastectomy flaps from the superior and inferior flaps were raised over the capsule for several centimeters to minimize tension for the closure. The capsule was split inferior to the skin incision to expose and remove the tissue expander.  Inspection of the pocket showed a normal healthy capsule and good integration of the biologic matrix.   Circumferential capsulotomies were performed to allow for  breast pocket expansion.  Measurements were made and a sizer utilized to confirm adequate pocket size for the implant dimensions.  Hemostasis was ensured with the electrocautery.  New gloves were applied. The implant was  soaked in antibiotic solution and placed in the pocket and oriented appropriately. A total of 350 cc of saline was placed.  The capsule on the anterior surface was closed with a 3-0 PDS suture. The remaining skin was closed with 3-0 Monocryl deep dermal and 4-0 Monocryl subcuticular stitches.  Dermabond was applied to the incision site. A breast binder and ABDs were placed.  The patient was allowed to wake from anesthesia and taken to the recovery room in satisfactory condition.   The advanced practice practitioner (APP) assisted throughout the case.  The APP was essential in retraction and counter traction when needed to make the case progress smoothly.  This retraction and assistance made it possible to see the tissue plans for the procedure.  The assistance was needed for blood control, tissue re-approximation and assisted with closure of the incision site.

## 2021-03-19 NOTE — Anesthesia Procedure Notes (Signed)
Procedure Name: LMA Insertion Date/Time: 03/19/2021 12:11 PM Performed by: Glory Buff, CRNA Pre-anesthesia Checklist: Patient identified, Emergency Drugs available, Suction available and Patient being monitored Patient Re-evaluated:Patient Re-evaluated prior to induction Oxygen Delivery Method: Circle system utilized Preoxygenation: Pre-oxygenation with 100% oxygen Induction Type: IV induction LMA: LMA inserted LMA Size: 4.0 Number of attempts: 2 Placement Confirmation: positive ETCO2 Tube secured with: Tape Dental Injury: Teeth and Oropharynx as per pre-operative assessment

## 2021-03-19 NOTE — Transfer of Care (Signed)
Immediate Anesthesia Transfer of Care Note  Patient: Natasha Graham  Procedure(s) Performed: Removal of left expander for saline implant (Left: Breast) exchange of right saline implant (Right: Breast)  Patient Location: PACU  Anesthesia Type:General  Level of Consciousness: drowsy, patient cooperative and responds to stimulation  Airway & Oxygen Therapy: Patient Spontanous Breathing and Patient connected to face mask oxygen  Post-op Assessment: Report given to RN and Post -op Vital signs reviewed and stable  Post vital signs: Reviewed and stable  Last Vitals:  Vitals Value Taken Time  BP    Temp    Pulse 82 03/19/21 1403  Resp 18 03/19/21 1403  SpO2 100 % 03/19/21 1403  Vitals shown include unvalidated device data.  Last Pain:  Vitals:   03/19/21 1012  TempSrc: Oral  PainSc: 0-No pain      Patients Stated Pain Goal: 8 (47/15/95 [id number])  Complications: No notable events documented.

## 2021-03-19 NOTE — Discharge Instructions (Addendum)
INSTRUCTIONS FOR AFTER SURGERY   You will likely have some questions about what to expect following your operation.  The following information will help you and your family understand what to expect when you are discharged from the hospital.  Following these guidelines will help ensure a smooth recovery and reduce risks of complications.  Postoperative instructions include information on: diet, wound care, medications and physical activity.  AFTER SURGERY Expect to go home after the procedure.  In some cases, you may need to spend one night in the hospital for observation.  DIET This surgery does not require a specific diet.  However, I have to mention that the healthier you eat the better your body can start healing. It is important to increasing your protein intake.  This means limiting the foods with added sugar.  Focus on fruits and vegetables and some meat. It is very important to drink water after your surgery.  If your urine is bright yellow, then it is concentrated, and you need to drink more water.  As a general rule after surgery, you should have 8 ounces of water every hour while awake.  If you find you are persistently nauseated or unable to take in liquids let us know.  NO TOBACCO USE or EXPOSURE.  This will slow your healing process and increase the risk of a wound.  WOUND CARE If you don't have a drain: You can shower the day after surgery.  Use fragrance free soap.  Dial, [place], [place] and Cetaphil are usually mild on the skin.  If you have steri-strips / tape directly attached to your skin leave them in place. It is OK to get these wet.  No baths, pools or hot tubs for two weeks. We close your incision to leave the smallest and best-looking scar. No ointment or creams on your incisions until given the go ahead.  Especially not Neosporin (Too many skin reactions with this one).  A few weeks after surgery you can use Mederma and start massaging the scar. We ask you to wear your binder or  sports bra for the first 6 weeks around the clock, including while sleeping. This provides added comfort and helps reduce the fluid accumulation at the surgery site.  ACTIVITY No heavy lifting until cleared by the doctor.  It is OK to walk and climb stairs. In fact, moving your legs is very important to decrease your risk of a blood clot.  It will also help keep you from getting deconditioned.  Every 1 to 2 hours get up and walk for 5 minutes. This will help with a quicker recovery back to normal.  Let pain be your guide so you don't do too much.  NO, you cannot do the spring cleaning and don't plan on taking care of anyone else.  This is your time for TLC.   WORK Everyone returns to work at different times. As a rough guide, most people take at least 1 - 2 weeks off prior to returning to work. If you need documentation for your job, bring the forms to your postoperative follow up visit.  DRIVING Arrange for someone to bring you home from the hospital.  You may be able to drive a few days after surgery but not while taking any narcotics or valium.  BOWEL MOVEMENTS Constipation can occur after anesthesia and while taking pain medication.  It is important to stay ahead for your comfort.  We recommend taking Milk of Magnesia (2 tablespoons; twice a day) while taking  the pain pills.  SEROMA This is fluid your body tried to put in the surgical site.  This is normal but if it creates excessive pain and swelling let us know.  It usually decreases in a few weeks.  MEDICATIONS and PAIN CONTROL At your preoperative visit for you history and physical you were given the following medications: An antibiotic: Start this medication when you get home and take according to the instructions on the bottle. Zofran 4 mg:  This is to treat nausea and vomiting.  You can take this every 6 hours as needed and only if needed. Norco (hydrocodone/acetaminophen) 5/325 mg:  This is only to be used after you have taken the  motrin or the tylenol. Every 8 hours as needed. Over the counter Medication to take: Ibuprofen (Motrin) 600 mg:  Take this every 6 hours.  If you have additional pain then take 500 mg of the tylenol.  Only take the Norco after you have tried these two. Miralax or stool softener of choice: Take this according to the bottle if you take the [place] Call your surgeon's office if any of the following occur:  Fever 101 degrees F or greater  Excessive bleeding or fluid from the incision site.  Pain that increases over time without aid from the medications  Redness, warmth, or pus draining from incision sites  Persistent nausea or inability to take in liquids  Severe misshapen area that underwent the operation.    Post Anesthesia Home Care Instructions  Activity: Get plenty of rest for the remainder of the day. A responsible individual must stay with you for 24 hours following the procedure.  For the next 24 hours, DO NOT: -Drive a car -Paediatric nurse -Drink alcoholic beverages -Take any medication unless instructed by your physician -Make any legal decisions or sign important papers.  Meals: Start with liquid foods such as gelatin or soup. Progress to regular foods as tolerated. Avoid greasy, spicy, heavy foods. If nausea and/or vomiting occur, drink only clear liquids until the nausea and/or vomiting subsides. Call your physician if vomiting continues.  Special Instructions/Symptoms: Your throat may feel dry or sore from the anesthesia or the breathing tube placed in your throat during surgery. If this causes discomfort, gargle with warm salt water. The discomfort should disappear within 24 hours.  If you had a scopolamine patch placed behind your ear for the management of post- operative nausea and/or vomiting:  1. The medication in the patch is effective for 72 hours, after which it should be removed.  Wrap patch in a tissue and discard in the trash. Wash hands  thoroughly with soap and water. 2. You may remove the patch earlier than 72 hours if you experience unpleasant side effects which may include dry mouth, dizziness or visual disturbances. 3. Avoid touching the patch. Wash your hands with soap and water after contact with the patch.

## 2021-03-19 NOTE — Interval H&P Note (Signed)
History and Physical Interval Note:  03/19/2021 11:12 AM  Natasha Graham  has presented today for surgery, with the diagnosis of history of breast cancer.  The various methods of treatment have been discussed with the patient and family. After consideration of risks, benefits and other options for treatment, the patient has consented to  Procedure(s) with comments: Removal of left expander for saline implant (Left) - 2 hours exchange of right saline implant (Right) as a surgical intervention.  The patient's history has been reviewed, patient examined, no change in status, stable for surgery.  I have reviewed the patient's chart and labs.  Questions were answered to the patient's satisfaction.     Loel Lofty Natasha Graham

## 2021-03-19 NOTE — Anesthesia Postprocedure Evaluation (Signed)
Anesthesia Post Note  Patient: Natasha Graham  Procedure(s) Performed: Removal of left expander for saline implant (Left: Breast) exchange of right saline implant (Right: Breast)     Patient location during evaluation: PACU Anesthesia Type: General Level of consciousness: awake and alert Pain management: pain level controlled Vital Signs Assessment: post-procedure vital signs reviewed and stable Respiratory status: spontaneous breathing, nonlabored ventilation and respiratory function stable Cardiovascular status: blood pressure returned to baseline and stable Postop Assessment: no apparent nausea or vomiting Anesthetic complications: no   No notable events documented.  Last Vitals:  Vitals:   03/19/21 1415 03/19/21 1430  BP: (!) 152/91 (!) 147/83  Pulse: 78 79  Resp: 13 11  Temp:    SpO2: 100% 95%    Last Pain:  Vitals:   03/19/21 1442  TempSrc:   PainSc: Asleep                 Lidia Collum

## 2021-03-19 NOTE — Anesthesia Preprocedure Evaluation (Addendum)
Anesthesia Evaluation  Patient identified by MRN, date of birth, ID band Patient awake    Reviewed: Allergy & Precautions, NPO status , Patient's Chart, lab work & pertinent test results  History of Anesthesia Complications (+) PONV and history of anesthetic complications  Airway Mallampati: I  TM Distance: >3 FB Neck ROM: Full    Dental  (+) Dental Advisory Given, Teeth Intact   Pulmonary neg pulmonary ROS,    Pulmonary exam normal        Cardiovascular hypertension, Pt. on medications Normal cardiovascular exam     Neuro/Psych negative neurological ROS     GI/Hepatic negative GI ROS, Neg liver ROS,   Endo/Other  negative endocrine ROS  Renal/GU negative Renal ROS  negative genitourinary   Musculoskeletal negative musculoskeletal ROS (+)   Abdominal   Peds  Hematology negative hematology ROS (+)   Anesthesia Other Findings  H/o breast cancer  Reproductive/Obstetrics                           Anesthesia Physical Anesthesia Plan  ASA: 2  Anesthesia Plan: General   Post-op Pain Management:    Induction: Intravenous  PONV Risk Score and Plan: 4 or greater and Ondansetron, Dexamethasone, Treatment may vary due to age or medical condition, Midazolam and Scopolamine patch - Pre-op  Airway Management Planned: Oral ETT and LMA  Additional Equipment: None  Intra-op Plan:   Post-operative Plan: Extubation in OR  Informed Consent: I have reviewed the patients History and Physical, chart, labs and discussed the procedure including the risks, benefits and alternatives for the proposed anesthesia with the patient or authorized representative who has indicated his/her understanding and acceptance.     Dental advisory given  Plan Discussed with:   Anesthesia Plan Comments:        Anesthesia Quick Evaluation

## 2021-03-20 ENCOUNTER — Encounter (HOSPITAL_BASED_OUTPATIENT_CLINIC_OR_DEPARTMENT_OTHER): Payer: Self-pay | Admitting: Plastic Surgery

## 2021-03-20 LAB — SURGICAL PATHOLOGY

## 2021-03-25 ENCOUNTER — Other Ambulatory Visit: Payer: Self-pay

## 2021-03-25 ENCOUNTER — Ambulatory Visit (INDEPENDENT_AMBULATORY_CARE_PROVIDER_SITE_OTHER): Payer: BC Managed Care – PPO | Admitting: Plastic Surgery

## 2021-03-25 ENCOUNTER — Encounter: Payer: Self-pay | Admitting: Plastic Surgery

## 2021-03-25 DIAGNOSIS — Z9889 Other specified postprocedural states: Secondary | ICD-10-CM

## 2021-03-25 NOTE — Progress Notes (Signed)
   Subjective:    Patient ID: Natasha Graham, female    DOB: [birth year], 60 y.o.   MRN: [id number]  Patient is a 60 year old female here for follow-up after undergoing exchange of her expanders to implants 1 week ago.  She has Mentor smooth round high-profile saline implants.  She has a 290-350 on the left with 350 cc of saline.  On the right it is a 380-450 with 450 cc of saline.  Honeycomb is still in place.  No sign of seroma or hematoma.  She is pleased so far and denies any pain.  Her bowels are normal.     Review of Systems  Constitutional: Negative.   Eyes: Negative.   Respiratory: Negative.    Cardiovascular: Negative.   Gastrointestinal: Negative.   Endocrine: Negative.   Genitourinary: Negative.       Objective:   Physical Exam Vitals and nursing note reviewed.  Constitutional:      Appearance: Normal appearance.  Cardiovascular:     Rate and Rhythm: Normal rate.     Pulses: Normal pulses.  Skin:    Capillary Refill: Capillary refill takes less than 2 seconds.  Neurological:     Mental Status: She is alert. Mental status is at baseline.  Psychiatric:        Mood and Affect: Mood normal.        Thought Content: Thought content normal.       Assessment & Plan:     ICD-10-CM   1. Status post right breast reconstruction  Z98.890       We will plan to remove the honeycomb next week and get a picture.  In the meantime she can shower.  If the honeycomb gets wet on the inside she can remove it.

## 2021-03-26 ENCOUNTER — Inpatient Hospital Stay: Payer: BC Managed Care – PPO

## 2021-03-26 ENCOUNTER — Inpatient Hospital Stay: Payer: BC Managed Care – PPO | Attending: Oncology

## 2021-03-26 VITALS — BP 183/92 | HR 69 | Temp 98.9°F | Resp 16

## 2021-03-26 DIAGNOSIS — C50512 Malignant neoplasm of lower-outer quadrant of left female breast: Secondary | ICD-10-CM

## 2021-03-26 DIAGNOSIS — C7951 Secondary malignant neoplasm of bone: Secondary | ICD-10-CM

## 2021-03-26 DIAGNOSIS — C50812 Malignant neoplasm of overlapping sites of left female breast: Secondary | ICD-10-CM | POA: Insufficient documentation

## 2021-03-26 DIAGNOSIS — I700 Atherosclerosis of aorta: Secondary | ICD-10-CM

## 2021-03-26 DIAGNOSIS — Z9013 Acquired absence of bilateral breasts and nipples: Secondary | ICD-10-CM | POA: Diagnosis not present

## 2021-03-26 DIAGNOSIS — Z170 Estrogen receptor positive status [ER+]: Secondary | ICD-10-CM | POA: Diagnosis not present

## 2021-03-26 DIAGNOSIS — C50912 Malignant neoplasm of unspecified site of left female breast: Secondary | ICD-10-CM

## 2021-03-26 DIAGNOSIS — Z923 Personal history of irradiation: Secondary | ICD-10-CM | POA: Diagnosis not present

## 2021-03-26 DIAGNOSIS — Z79899 Other long term (current) drug therapy: Secondary | ICD-10-CM | POA: Diagnosis not present

## 2021-03-26 DIAGNOSIS — K760 Fatty (change of) liver, not elsewhere classified: Secondary | ICD-10-CM

## 2021-03-26 LAB — CBC WITH DIFFERENTIAL/PLATELET
Abs Immature Granulocytes: 0.04 10*3/uL (ref 0.00–0.07)
Basophils Absolute: 0.1 10*3/uL (ref 0.0–0.1)
Basophils Relative: 2 %
Eosinophils Absolute: 0.1 10*3/uL (ref 0.0–0.5)
Eosinophils Relative: 3 %
HCT: 34.8 % — ABNORMAL LOW (ref 36.0–46.0)
Hemoglobin: 12.3 g/dL (ref 12.0–15.0)
Immature Granulocytes: 1 %
Lymphocytes Relative: 26 %
Lymphs Abs: 1.1 10*3/uL (ref 0.7–4.0)
MCH: 35.2 pg — ABNORMAL HIGH (ref 26.0–34.0)
MCHC: 35.3 g/dL (ref 30.0–36.0)
MCV: 99.7 fL (ref 80.0–100.0)
Monocytes Absolute: 0.4 10*3/uL (ref 0.1–1.0)
Monocytes Relative: 8 %
Neutro Abs: 2.6 10*3/uL (ref 1.7–7.7)
Neutrophils Relative %: 60 %
Platelets: 300 10*3/uL (ref 150–400)
RBC: 3.49 MIL/uL — ABNORMAL LOW (ref 3.87–5.11)
RDW: 11.5 % (ref 11.5–15.5)
WBC: 4.4 10*3/uL (ref 4.0–10.5)
nRBC: 0 % (ref 0.0–0.2)

## 2021-03-26 LAB — COMPREHENSIVE METABOLIC PANEL
ALT: 24 U/L (ref 0–44)
AST: 18 U/L (ref 15–41)
Albumin: 3.6 g/dL (ref 3.5–5.0)
Alkaline Phosphatase: 53 U/L (ref 38–126)
Anion gap: 9 (ref 5–15)
BUN: 9 mg/dL (ref 8–23)
CO2: 30 mmol/L (ref 22–32)
Calcium: 9.6 mg/dL (ref 8.9–10.3)
Chloride: 104 mmol/L (ref 98–111)
Creatinine, Ser: 0.81 mg/dL (ref 0.44–1.00)
GFR, Estimated: >60 mL/min (ref >60)
Glucose, Bld: 92 mg/dL (ref 70–99)
Potassium: 3.7 mmol/L (ref 3.5–5.1)
Sodium: 143 mmol/L (ref 135–145)
Total Bilirubin: 0.3 mg/dL (ref 0.3–1.2)
Total Protein: 6.8 g/dL (ref 6.5–8.1)

## 2021-03-26 MED ORDER — FULVESTRANT 250 MG/5ML IM SOLN
500.0000 mg | Freq: Once | INTRAMUSCULAR | Status: AC
  Administered 2021-03-26: 500 mg via INTRAMUSCULAR

## 2021-03-26 MED ORDER — DENOSUMAB 120 MG/1.7ML ~~LOC~~ SOLN
120.0000 mg | Freq: Once | SUBCUTANEOUS | Status: AC
  Administered 2021-03-26: 120 mg via SUBCUTANEOUS

## 2021-03-26 MED ORDER — DENOSUMAB 120 MG/1.7ML ~~LOC~~ SOLN
SUBCUTANEOUS | Status: AC
  Filled 2021-03-26: qty 1.7

## 2021-03-26 MED ORDER — FULVESTRANT 250 MG/5ML IM SOLN
INTRAMUSCULAR | Status: AC
  Filled 2021-03-26: qty 10

## 2021-03-26 NOTE — Patient Instructions (Signed)
Fulvestrant injection What is this medication? FULVESTRANT (ful VES trant) blocks the effects of estrogen. It is used to treat breast cancer. This medicine may be used for other purposes; ask your health care provider or pharmacist if you have questions. COMMON BRAND NAME(S): FASLODEX What should I tell my care team before I take this medication? They need to know if you have any of these conditions: bleeding disorders liver disease low blood counts, like low white cell, platelet, or red cell counts an unusual or allergic reaction to fulvestrant, other medicines, foods, dyes, or preservatives pregnant or trying to get pregnant breast-feeding How should I use this medication? This medicine is for injection into a muscle. It is usually given by a health care professional in a hospital or clinic setting. Talk to your pediatrician regarding the use of this medicine in children. Special care may be needed. Overdosage: If you think you have taken too much of this medicine contact a poison control center or emergency room at once. NOTE: This medicine is only for you. Do not share this medicine with others. What if I miss a dose? It is important not to miss your dose. Call your doctor or health care professional if you are unable to keep an appointment. What may interact with this medication? medicines that treat or prevent blood clots like warfarin, enoxaparin, dalteparin, apixaban, dabigatran, and rivaroxaban This list may not describe all possible interactions. Give your health care provider a list of all the medicines, herbs, non-prescription drugs, or dietary supplements you use. Also tell them if you smoke, drink alcohol, or use illegal drugs. Some items may interact with your medicine. What should I watch for while using this medication? Your condition will be monitored carefully while you are receiving this medicine. You will need important blood work done while you are taking this  medicine. Do not become pregnant while taking this medicine or for at least 1 year after stopping it. Women of child-bearing potential will need to have a negative pregnancy test before starting this medicine. Women should inform their doctor if they wish to become pregnant or think they might be pregnant. There is a potential for serious side effects to an unborn child. Men should inform their doctors if they wish to father a child. This medicine may lower sperm counts. Talk to your health care professional or pharmacist for more information. Do not breast-feed an infant while taking this medicine or for 1 year after the last dose. What side effects may I notice from receiving this medication? Side effects that you should report to your doctor or health care professional as soon as possible: allergic reactions like skin rash, itching or hives, swelling of the face, lips, or tongue feeling faint or lightheaded, falls pain, tingling, numbness, or weakness in the legs signs and symptoms of infection like fever or chills; cough; flu-like symptoms; sore throat vaginal bleeding Side effects that usually do not require medical attention (report to your doctor or health care professional if they continue or are bothersome): aches, pains constipation diarrhea headache hot flashes nausea, vomiting pain at site where injected stomach pain This list may not describe all possible side effects. Call your doctor for medical advice about side effects. You may report side effects to FDA at 1-800-FDA-1088. Where should I keep my medication? This drug is given in a hospital or clinic and will not be stored at home. NOTE: This sheet is a summary. It may not cover all possible information. If you have   Denosumab injection What is this medication? DENOSUMAB (den oh sue mab) slows bone breakdown. Prolia is used to treat osteoporosis in women after  menopause and in men, and in people who are taking corticosteroids for 6 months or more. Xgeva is used to treat a high calcium level due to cancer and to prevent bone fractures and other bone problems caused by multiple myeloma or cancer bone metastases. Xgeva is also used totreat giant cell tumor of the bone. This medicine may be used for other purposes; ask your health care provider orpharmacist if you have questions. COMMON BRAND NAME(S): Prolia, XGEVA What should I tell my care team before I take this medication? They need to know if you have any of these conditions: dental disease having surgery or tooth extraction infection kidney disease low levels of calcium or Vitamin D in the blood malnutrition on hemodialysis skin conditions or sensitivity thyroid or parathyroid disease an unusual reaction to denosumab, other medicines, foods, dyes, or preservatives pregnant or trying to get pregnant breast-feeding How should I use this medication? This medicine is for injection under the skin. It is given by a health careprofessional in a hospital or clinic setting. A special MedGuide will be given to you before each treatment. Be sure to readthis information carefully each time. For Prolia, talk to your pediatrician regarding the use of this medicine in children. Special care may be needed. For Xgeva, talk to your pediatrician regarding the use of this medicine in children. While this drug may be prescribed for children as young as 13 years for selected conditions,precautions do apply. Overdosage: If you think you have taken too much of this medicine contact apoison control center or emergency room at once. NOTE: This medicine is only for you. Do not share this medicine with others. What if I miss a dose? It is important not to miss your dose. Call your doctor or health careprofessional if you are unable to keep an appointment. What may interact with this medication? Do not take this medicine  with any of the following medications: other medicines containing denosumab This medicine may also interact with the following medications: medicines that lower your chance of fighting infection steroid medicines like prednisone or cortisone This list may not describe all possible interactions. Give your health care provider a list of all the medicines, herbs, non-prescription drugs, or dietary supplements you use. Also tell them if you smoke, drink alcohol, or use illegaldrugs. Some items may interact with your medicine. What should I watch for while using this medication? Visit your doctor or health care professional for regular checks on your progress. Your doctor or health care professional may order blood tests andother tests to see how you are doing. Call your doctor or health care professional for advice if you get a fever, chills or sore throat, or other symptoms of a cold or flu. Do not treat yourself. This drug may decrease your body's ability to fight infection. Try toavoid being around people who are sick. You should make sure you get enough calcium and vitamin D while you are taking this medicine, unless your doctor tells you not to. Discuss the foods you eatand the vitamins you take with your health care professional. See your dentist regularly. Brush and floss your teeth as directed. Before youhave any dental work done, tell your dentist you are receiving this medicine. Do not become pregnant while taking this medicine or for 5 months after stopping it. Talk with your doctor or health care professional about   medicine. Do not become pregnant while taking this medicine or for 5 months after stopping it. Talk with your doctor or health care professional about your birth control options while taking this medicine. Women should inform their doctor if they wish to become pregnant or think they might be pregnant. There is a potential for serious side effects to an unborn child. Talk to your health care professional or pharmacist for more information. What side effects may I notice from receiving this  medication? Side effects that you should report to your doctor or health care professional as soon as possible: allergic reactions like skin rash, itching or hives, swelling of the face, lips, or tongue bone pain breathing problems dizziness jaw pain, especially after dental work redness, blistering, peeling of the skin signs and symptoms of infection like fever or chills; cough; sore throat; pain or trouble passing urine signs of low calcium like fast heartbeat, muscle cramps or muscle pain; pain, tingling, numbness in the hands or feet; seizures unusual bleeding or bruising unusually weak or tired Side effects that usually do not require medical attention (report to your doctor or health care professional if they continue or are bothersome): constipation diarrhea headache joint pain loss of appetite muscle pain runny nose tiredness upset stomach This list may not describe all possible side effects. Call your doctor for medical advice about side effects. You may report side effects to FDA at 1-800-FDA-1088. Where should I keep my medication? This medicine is only given in a clinic, doctor's office, or other health care setting and will not be stored at home. NOTE: This sheet is a summary. It may not cover all possible information. If you have questions about this medicine, talk to your doctor, pharmacist, or health care provider.  2022 [employer] (2017-12-24 16:10:44)   2022 [employer] (2017-11-25 11:34:41)  

## 2021-03-27 LAB — CANCER ANTIGEN 27.29: CA 27.29: 48.1 U/mL — ABNORMAL HIGH (ref 0.0–38.6)

## 2021-03-28 ENCOUNTER — Other Ambulatory Visit (HOSPITAL_COMMUNITY): Payer: Self-pay

## 2021-03-31 ENCOUNTER — Other Ambulatory Visit (HOSPITAL_COMMUNITY): Payer: Self-pay

## 2021-04-01 ENCOUNTER — Other Ambulatory Visit (HOSPITAL_COMMUNITY): Payer: Self-pay

## 2021-04-08 ENCOUNTER — Ambulatory Visit (INDEPENDENT_AMBULATORY_CARE_PROVIDER_SITE_OTHER): Payer: BC Managed Care – PPO | Admitting: Surgical

## 2021-04-08 ENCOUNTER — Other Ambulatory Visit (HOSPITAL_COMMUNITY): Payer: Self-pay

## 2021-04-08 ENCOUNTER — Other Ambulatory Visit: Payer: Self-pay

## 2021-04-08 DIAGNOSIS — Z9012 Acquired absence of left breast and nipple: Secondary | ICD-10-CM

## 2021-04-08 DIAGNOSIS — Z9889 Other specified postprocedural states: Secondary | ICD-10-CM

## 2021-04-08 DIAGNOSIS — C50512 Malignant neoplasm of lower-outer quadrant of left female breast: Secondary | ICD-10-CM

## 2021-04-08 DIAGNOSIS — Z170 Estrogen receptor positive status [ER+]: Secondary | ICD-10-CM

## 2021-04-08 NOTE — Progress Notes (Signed)
Patient is a 60 year old female here for follow-up after undergoing exchange of left breast expander to saline implant and exchange of right saline implant with Dr. Marla Roe on 03/19/2021.  She is 3 weeks postop.  She reports that overall she is doing well.  She was hopeful that the left side would be less firm after removal of expander, however the radiation has caused some skin tightening.  Chaperone present on exam On exam bilateral breast incisions are intact, bilateral NAC's are viable.  The left breast skin is fairly firm, no wounds are noted.  Left NAC with crease noted centrally.  No erythema.  No cellulitic changes.  No subcutaneous fluid collection noted with palpation.  Good range of motion noted.   Recommend continue her compressive garment 24/7 for 3 more weeks. Recommend avoiding any strenuous activities for a few more weeks. Recommend following up in 2 months for reevaluation.  We did briefly discuss fat grafting to bilateral breasts for improvement in contour in various areas of volume loss. Recommend calling with questions or concerns.  Pictures were obtained of the patient and placed in the chart with the patient's or guardian's permission.

## 2021-04-23 ENCOUNTER — Other Ambulatory Visit: Payer: BC Managed Care – PPO

## 2021-04-23 ENCOUNTER — Ambulatory Visit: Payer: BC Managed Care – PPO

## 2021-04-23 NOTE — Progress Notes (Signed)
[place]  [phone] [phone]     ID: Natasha Graham DOB: 18-Jul-1961  MR#: [id number]  EXH#:[id number]  Patient Care Team: Leonides Sake, MD as PCP - General (Family Medicine) Rolm Bookbinder, MD as Consulting Physician (General Surgery) Lecretia Buczek, Virgie Dad, MD as Consulting Physician (Oncology) Mauro Kaufmann, RN as Registered Nurse Rockwell Germany, RN as Registered Nurse Dillingham, Loel Lofty, DO as Attending Physician (Plastic Surgery) Gery Pray, MD as Consulting Physician (Radiation Oncology) Brien Few, MD as Consulting Physician (Obstetrics and Gynecology) Raina Mina, RPH-CPP (Pharmacist) OTHER MD: Sherrie Mustache MD   CHIEF COMPLAINT: new estrogen receptor positive stage IV breast cancer; remote noninvasive right breast cancer (s/p bilateral mastectomies)  CURRENT TREATMENT: Denosumab/Xgeva; fulvestrant; palbociclib/Ibrance   INTERVAL HISTORY: Natasha Graham returns today for follow up and treatment of her newly diagnosed invasive left breast cancer.  She continues on fulvestrant every 28 days.  She has had no problems from this including no significant discomfort from the actual injections.  With an injection sometime ago she had a little bit of numbness and then occasionally she still gets a twinge but that is rare.  She also receives denosumab/Xgeva, most recently 03/26/2021.  She did not experience any bony aches or any symptoms suggestive of hypocalcemia.  She saw her dentist Dr. Tyson Alias in [place] within the last few weeks and had a cleaning.  There were no unusual findings.  She started on Ibrance on 09/12/2020.  We have had to drop her dose repeatedly currently to 75 mg .  This continues 3 weeks on and 1 week off.  As she tolerates it well and does not notice a significant difference on the week off in other weeks.  She underwent definitive breast implant placement on 03/19/2021 under Dr. Marla Roe.  We are  following her CA 27-29 Lab Results  Component Value Date   FY1017 48.1 (H) 03/26/2021   CA2729 54.3 (H) 02/26/2021   CA2729 64.1 (H) 01/29/2021   CA2729 77.8 (H) 12/30/2020   CA2729 87.3 (H) 12/02/2020    REVIEW OF SYSTEMS: Natasha Graham tells me since she had her implant reconstruction she has felt better.  She does not know if it is the actual surgery, the fact that she is now taking a little bit iron with her multivitamin, or the fact that her daughter from college was helping her care for the 68-monthold and 274year-old grandchildren she keeps.  She just feels like she can do more now than before.  She reports no pain, no unusual headaches visual changes nausea or vomiting.  There have been no falls.  Aside from those issues detailed below review of systems today was benign   COVID 19 VACCINATION STATUS: Patient had antibodies to the virus documented November 2020.  She is not planning on vaccinations   LEFT BREAST CANCER HISTORY: From the original intake note:  She had routine screening left mammogram on 06/14/2019 showing a developing asymmetry. She presented for left diagnostic mammogram and left breast ultrasound on 07/07/2019. Physical exam that day showed a palpable, firm, superficial mass in the left breast at 6 o'clock along the site of reduction mammoplasty scar (performed in 2015 with right mastectomy). Scans showed: breast density category B; 2.1 cm superficial mass involving the skin in the left breast at 6 o'clock; no enlarged adenopathy in the left axilla.   Accordingly on 07/12/2019 she proceeded to biopsy of the left breast area in question. The pathology from this procedure (([id number] showed:  invasive lobular carcinoma, grade 1, with perineural invasion present. Prognostic indicators significant for: estrogen receptor, 60% positive with moderate staining intensity and progesterone receptor, 0% negative. Proliferation marker Ki67 at <1%. HER2 equvocal by immunohistochemistry (2+),  but negative by fluorescent in situ hybridization with a signals ratio 1.18 and number per cell 1.95.  She opted to proceed with left mastectomy on 09/27/2019 under Dr. Donne Hazel with immediate reconstruction under Dr. Marla Roe. Pathology from the procedure [id number]) revealed: invasive lobular carcinoma, grade 2, 6.2 cm, focally involving anterior margin and involving skin dermis without involvement of epidermis; lobular carcinoma in situ with pagetoid spread; small focus of low grade ductal carcinoma in situ, 0.2 cm.  Two lymph nodes were biopsied. One showed metastatic lobular carcinoma with focus measuring 2.1 cm with evidence of extranodal extension. The other lymph node also showed metastatic lobular carcinoma with no lymphoid tissue present.  Biopsy of the nipple was also taken at that time and showed no evidence of invasive carcinoma.  Mammaprint was performed on the final surgical sample. This returned showing low risk.  RIGHT BREAST CANCER HISTORY: Natasha Graham has a history of right breast biopsy in late 2007 showing atypical ductal hyperplasia in the upper outer right breast.. Breast MRI 07/13/2006 showed postbiopsy changes in the upper right breast, and some right breast cysts. There were no solid masses however or areas of abnormal enhancement. There were no enlarged lymph nodes. Incidental finding of liver cysts was made. On 07/30/2006 she underwent needle localization of the calcifications in the outer right breast which also showed ([id number]) atypical ductal hyperplasia, with no malignancy.   On 10/21/2012 routine digital bilateral screening mammography at the breast Center showed breast density to be category C. In the right breast there were calcifications and architectural distortion to go with skin retraction. Additional views obtained 11/17/2012 showed extensive but stable punctate microcalcifications in the upper outer right breast. Many of these were superficial and not amenable  to stereotactic biopsy. MRI was suggested but the patient was unable to undergo that test and therefore six-month mammography was obtained 05/15/2013. This showed the calcifications in the outer right breast to have been stable. Because of their extents and because of their superficial location they could not be all sampled all removed with biopsy. MRI was again discussed, but the patient has Harrington rods in place and also felt the cost of MRI was prohibitive.  On 11/13/2013 further six-month follow-up showed a grouping of intermediate microcalcifications in the lateral right breast and these were biopsied 11/20/2013. The result of that procedure ([id number]) showed atypical ductal hyperplasia. Accordingly on 02/23/2014 the patient underwent right lumpectomy, with the pathology ([id number]) showing ductal carcinoma in situ, grade 2, measuring 0.7 cm. Estrogen receptor was 100% positive. Progesterone receptor was 96% positive. Both showed strong staining intensity. Margins were clear but the closest margin anteriorly was at 1 mm.  On 03/07/2014 the patient underwent postoperative mammography which showed post lumpectomy changes but also calcifications extending from the lumpectomy site anteriorly towards the nipple. On 03/12/2014 the patient underwent bilateral breast MRI. Breast composition here was described as category B. In the right breast there was a 4.8 cm fluidlike excisional cavity in the lower outer right breast. Adjacent to this was a focus of non-masslike enhancement measuring 1.9 cm. This is an area associated with the scar from the excision in 2007. There was also non-masslike enhancement inferior to the biopsy cavity measuring 3.8 cm. There were no abnormal lymph nodes and no masses or abnormal  enhancement in the left breast. Specifically the left breast lower outer quadrant was unchanged from 2007 MRI.  Overall it was felt the linear non-masslike enhancement extending almost 4 cm  inferior to the excisional cavity was sufficiently suspicious to warrant biopsy, and this was performed 03/29/2014. It again showed ductal carcinoma in situ, grade 1, estrogen receptor and progesterone receptor both 100% positive with strong staining intensity.  At this point the patient met with surgery and opted for definitive right mastectomy with immediate reconstruction. On 05/16/2014 she underwent simple mastectomy with sentinel lymph node sampling. The pathology ([id number]) showed atypical ductal hyperplasia but no evidence of neoplasia. All 5 sentinel lymph nodes were clear. The patient had immediate right breast reconstruction with tissue expander and dermal matrix placement.  She is scheduled for definitive implant placement 08/08/2014.  Her subsequent history is as detailed below   PAST MEDICAL HISTORY: Past Medical History:  Diagnosis Date   Cancer ([place])    breast cancer left 2021 roght 2015   Family history of brain cancer    Family history of breast cancer    Family history of colon cancer    Family history of stomach cancer    History of radiation therapy last done February 06 2020   Hypertension    PMB (postmenopausal bleeding)    PONV (postoperative nausea and vomiting)    likes scopolamine patch   Scoliosis    Wears glasses    Wears glasses     PAST SURGICAL HISTORY: Past Surgical History:  Procedure Laterality Date   AUGMENTATION MAMMAPLASTY Right    2015 post mastectomy   AXILLARY LYMPH NODE DISSECTION Left 11/13/2019   Procedure: LEFT AXILLARY LYMPH NODE DISSECTION;  Surgeon: Rolm Bookbinder, MD;  Location: [facility];  Service: General;  Laterality: Left;   BREAST IMPLANT EXCHANGE Right 03/19/2021   Procedure: exchange of right saline implant;  Surgeon: Wallace Going, DO;  Location: [facility];  Service: Plastics;  Laterality: Right;   BREAST RECONSTRUCTION WITH PLACEMENT OF TISSUE EXPANDER AND FLEX HD (ACELLULAR HYDRATED  DERMIS) Right 05/16/2014   Procedure: IMMEDIATE RIGHT BREAST RECONSTRUCTION WITH PLACEMENT OF TISSUE EXPANDER AND FLEX HD (ACELLULAR HYDRATED DERMIS);  Surgeon: Theodoro Kos, DO;  Location: [facility];  Service: Plastics;  Laterality: Right;   BREAST RECONSTRUCTION WITH PLACEMENT OF TISSUE EXPANDER AND FLEX HD (ACELLULAR HYDRATED DERMIS) Left 09/27/2019   Procedure: LEFT BREAST RECONSTRUCTION WITH PLACEMENT OF TISSUE EXPANDER AND FLEX HD (ACELLULAR HYDRATED DERMIS);  Surgeon: Wallace Going, DO;  Location: [facility];  Service: Plastics;  Laterality: Left;   BREAST SURGERY     right breast excisional biopsy   DILATATION & CURETTAGE/HYSTEROSCOPY WITH MYOSURE N/A 12/20/2020   Procedure: [place];  Surgeon: Brien Few, MD;  Location: [facility];  Service: Gynecology;  Laterality: N/A;   DILATION AND CURETTAGE OF UTERUS     HYSTEROSCOPY WITH D & C N/A 09/20/2015   Procedure: DILATATION AND CURETTAGE /HYSTEROSCOPY;  Surgeon: Brien Few, MD;  Location: [facility];  Service: Gynecology;  Laterality: N/A;   LIPOSUCTION Bilateral 08/08/2014   Procedure: LIPO SUCTION ;  Surgeon: Theodoro Kos, DO;  Location: [facility];  Service: Plastics;  Laterality: Bilateral;   MASTECTOMY Right 05/16/2014   placement of acellular dermal matrix & tissue expanders    MASTOPEXY Left 08/08/2014   Procedure:  MASTOPEXY FOR SYMMETRY;  Surgeon: Theodoro Kos, DO;  Location: [facility];  Service: Clinical cytogeneticist;  Laterality: Left;   NIPPLE SPARING MASTECTOMY WITH SENTINEL LYMPH NODE BIOPSY Left 09/27/2019   Procedure: LEFT NIPPLE SPARING MASTECTOMY WITH LEFT AXILLARY SENTINEL LYMPH NODE BIOPSY;  Surgeon: Rolm Bookbinder, MD;  Location: [facility];  Service: General;  Laterality: Left;   REDUCTION MAMMAPLASTY Left    2015   REMOVAL OF TISSUE EXPANDER AND PLACEMENT OF IMPLANT Right 08/08/2014   Procedure: REMOVAL OF  RIGHT  TISSUE EXPANDERS WITH PLACEMENT OF RIGHT BREAST IMPLANTS WITH LIPO SUCTION ;  Surgeon: Theodoro Kos, DO;  Location: [facility];  Service: Plastics;  Laterality: Right;   REMOVAL OF TISSUE EXPANDER AND PLACEMENT OF IMPLANT Left 03/19/2021   Procedure: Removal of left expander for saline implant;  Surgeon: Wallace Going, DO;  Location: [facility];  Service: Plastics;  Laterality: Left;  2 hours   ROBOTIC ASSISTED TOTAL HYSTERECTOMY WITH BILATERAL SALPINGO OOPHERECTOMY Bilateral 02/06/2021   Procedure: XI ROBOTIC ASSISTED TOTAL HYSTERECTOMY WITH BILATERAL SALPINGO OOPHORECTOMY, LYSIS of SIGMOID ADHESIONS MCCALL CUL DE PLASTY;  Surgeon: Brien Few, MD;  Location: [facility];  Service: Gynecology;  Laterality: Bilateral;   SCAR REVISION Left 11/13/2019   Procedure: EXCISION OF LEFT MASTECTOMY SKIN;  Surgeon: Rolm Bookbinder, MD;  Location: [facility];  Service: General;  Laterality: Left;   scoliosis  1972   harrington rods-age 18   TONSILLECTOMY  as child    FAMILY HISTORY Family History  Problem Relation Age of Onset   Heart disease Mother    Cancer Father        liver   Colon cancer Father 37   Head & neck cancer Father        oral cancer - dx in 40s-50s   Breast cancer Paternal Aunt        dx <50   Brain cancer Brother        possible meningioma   Cancer Maternal Aunt        liver/lung cancer   Stomach cancer Paternal Uncle    Heart disease Maternal Grandmother    Lymphoma Maternal Grandfather        NHL   Heart attack Paternal Grandfather    Cancer Paternal Uncle        NOS   there is significant cancer history on the paternal side, her father dying at the age of 15 with what the patient tells me was metastatic head and neck cancer (but also involving the colon and liver). One of the patient's father's brothers had stomach cancer, and another had a brain tumor, and 2 of the patient's father's sisters had  breast cancer, 1 diagnosed at age 61, the other at age 58.  On the mother's side there is a history of non-Hodgkin's lymphoma at age 33. There is no history of ovarian cancer in the family   GYNECOLOGIC HISTORY:  Patient's last menstrual period was 10/16/2013.  menarche age 22, first live birth age 30. The patient is GX P3. She stopped having periods in October 2014. She did not take hormone replacement. She did take birth control pills for approximately 1 year remotely, with no complications.   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 She works as an Web designer. Son Truman Hayward drives a truck for the family business. Daughter Thayer Headings is a Forensic psychologist and currently works as an IT trainer in [place].  Daughter Roselyn Reef is studying  history and English at [employer]. The patient has 2 granddaughters aged 53 and 1 as of May 2021. She attends [employer] DIRECTIVES: In the absence of any documents to the contrary the patient's husband is her healthcare power of attorney   HEALTH MAINTENANCE: Social History   Tobacco Use   Smoking status: Never   Smokeless tobacco: Never  Vaping Use   Vaping Use: Never used  Substance Use Topics   Alcohol use: No   Drug use: No     Colonoscopy:  2013  PAP: March 2015  Bone density:  Lipid panel:  No Known Allergies  Current Outpatient Medications  Medication Sig Dispense Refill   acetaminophen (TYLENOL) 500 MG tablet Take 500 mg by mouth.     Ascorbic Acid (VITAMIN C) 100 MG tablet Take 100 mg by mouth daily. 1000 mg     denosumab (XGEVA) 120 MG/1.7ML SOLN injection Inject 120 mg into the skin once. Monthly     Flaxseed Oil (LINSEED OIL) OIL by Misc.(Non-Drug; Combo Route) route.     Fulvestrant (FASLODEX IM) Inject into the muscle. Shot every 4 weeks.     Ginger, Zingiber officinalis, (GINGER PO) Take by mouth.     ibuprofen  (ADVIL) 200 MG tablet Take by mouth. Takes 2 of 200 mg     lisinopril-hydrochlorothiazide (ZESTORETIC) 20-25 MG tablet Take 1 tablet by mouth daily at 2 PM. Pt states lisinopril increased to 30 mg on 12-12-2020     Magnesium 250 MG TABS Take by mouth.     Multiple Vitamin (MULTIVITAMIN) capsule Take by mouth.     ondansetron (ZOFRAN) 4 MG tablet Take 1 tablet (4 mg total) by mouth every 8 (eight) hours as needed for nausea or vomiting. 20 tablet 0   OVER THE COUNTER MEDICATION Calcium 1262m-Take daily.     OVER THE COUNTER MEDICATION Trubiotics-Take daily.     palbociclib (IBRANCE) 75 MG tablet Take 1 tablet (75 mg total) by mouth daily. Take for 21 days on, 7 days off, repeat every 28 days. 21 tablet 6   Turmeric 500 MG CAPS Take by mouth.     No current facility-administered medications for this visit.    OBJECTIVE: White woman in no acute distress Vitals:   04/24/21 0947  BP: (!) 130/93  Pulse: (!) 101  Resp: 18  Temp: 97.8 F (36.6 C)  SpO2: 100%     Body mass index is 29.01 kg/m.    ECOG FS:1 - Symptomatic but completely ambulatory  Sclerae unicteric, EOMs intact Wearing a mask No cervical or supraclavicular adenopathy Lungs no rales or rhonchi Heart regular rate and rhythm Abd soft, nontender, positive bowel sounds MSK no focal spinal tenderness, no upper extremity lymphedema Neuro: nonfocal, well oriented, appropriate affect Breasts: Status post bilateral mastectomies with bilateral implant reconstruction.  The left side is slightly smaller and a little tighter than the right.  Otherwise there are no unusual findings and certainly no evidence of local recurrence.  Both axillae are benign.   LAB RESULTS:  CMP     Component Value Date/Time   NA 143 03/26/2021 0746   NA 143 07/30/2014 1603   K 3.7 03/26/2021 0746   K 3.5 07/30/2014 1603   CL 104 03/26/2021 0746   CO2 30 03/26/2021 0746   CO2 30 (H) 07/30/2014 1603   GLUCOSE 92 03/26/2021 0746   GLUCOSE 93  07/30/2014 1603   BUN 9 03/26/2021 0746   BUN 10.5 07/30/2014  1603   CREATININE 0.81 03/26/2021 0746   CREATININE 0.9 07/30/2014 1603   CALCIUM 9.6 03/26/2021 0746   CALCIUM 9.5 07/30/2014 1603   PROT 6.8 03/26/2021 0746   PROT 7.0 07/30/2014 1603   ALBUMIN 3.6 03/26/2021 0746   ALBUMIN 4.0 07/30/2014 1603   AST 18 03/26/2021 0746   AST 23 07/30/2014 1603   ALT 24 03/26/2021 0746   ALT 23 07/30/2014 1603   ALKPHOS 53 03/26/2021 0746   ALKPHOS 78 07/30/2014 1603   BILITOT 0.3 03/26/2021 0746   BILITOT 0.29 07/30/2014 1603   GFRNONAA >60 03/26/2021 0746   GFRAA >60 04/29/2020 0829    INo results found for: SPEP, UPEP  Lab Results  Component Value Date   WBC 2.7 (L) 04/24/2021   NEUTROABS 1.2 (L) 04/24/2021   HGB 12.9 04/24/2021   HCT 35.9 (L) 04/24/2021   MCV 99.2 04/24/2021   PLT 239 04/24/2021      Chemistry      Component Value Date/Time   NA 143 03/26/2021 0746   NA 143 07/30/2014 1603   K 3.7 03/26/2021 0746   K 3.5 07/30/2014 1603   CL 104 03/26/2021 0746   CO2 30 03/26/2021 0746   CO2 30 (H) 07/30/2014 1603   BUN 9 03/26/2021 0746   BUN 10.5 07/30/2014 1603   CREATININE 0.81 03/26/2021 0746   CREATININE 0.9 07/30/2014 1603      Component Value Date/Time   CALCIUM 9.6 03/26/2021 0746   CALCIUM 9.5 07/30/2014 1603   ALKPHOS 53 03/26/2021 0746   ALKPHOS 78 07/30/2014 1603   AST 18 03/26/2021 0746   AST 23 07/30/2014 1603   ALT 24 03/26/2021 0746   ALT 23 07/30/2014 1603   BILITOT 0.3 03/26/2021 0746   BILITOT 0.29 07/30/2014 1603      No results found for: LABCA2  No components found for: LABCA125  No results for input(s): INR in the last 168 hours.  Urinalysis No results found for: COLORURINE, APPEARANCEUR, LABSPEC, PHURINE, GLUCOSEU, HGBUR, BILIRUBINUR, KETONESUR, PROTEINUR, UROBILINOGEN, NITRITE, LEUKOCYTESUR   STUDIES: Most recent staging studies included a bone scan 10/03/2020 showing diffuse bone involvement, CT chest and neck  08/02/2020 showing radiation changes in the lung and a <0.5 cm nodulre requiring f/u, and improvement in the left cervical nodes by soft-tissue CT of the neck same date No results found.   ASSESSMENT: 60 y.o. [place], [place] woman  RIGHT BREAST CANCER (1) status post right breast upper-outer quadrant lumpectomy 07/30/2006 showing atypical ductal hyperplasia.  (2) right breast upper outer quadrant biopsy 11/20/2013 showed atypical ductal hyperplasia   (3) right lumpectomy 02/26/2014 showed ductal carcinoma in situ measuring 0.7 cm, estrogen receptor 100% positive, progesterone receptor 96% positive, with 1 mm margins  (4) right breast lower inner quadrant biopsy 03/29/2014 showed ductal carcinoma in situ, 100% estrogen receptor positive, 100% progesterone receptor positive,  (5) status post right mastectomy with sentinel lymph node sampling showing only atypical ductal hyperplasia    (a) five sentinel lymph nodes removed, all clear  (b) status post right saline implant reconstruction  (6) the patient opted against prophylactic antiestrogens   LEFT BREAST CANCER (7) status post left breast lower outer quadrant biopsy 07/12/2019 for a clinical T1N0, stage IA invasive lobular carcinoma, grade 1, with evidence of perineural invasion, estrogen receptor moderately positive, progesterone receptor negative, HER-2 not amplified, with an MIB-1 of less than 1%  (8) status post left nipple sparing mastectomy 09/27/2019 for a pT3 pN1, stage IIIA invasive lobular  carcinoma, grade 2, with a negative nipple biopsy and a focally positive anterior margin  (a) 1 sentinel lymph node had a 2.1 cm tumor deposit; a second positive "lymph node" had no lymph node tissue  (b) immediate expander placement with definitive implant placement 03/19/2021  (c) status post left axillary lymph node dissection and margin excision 11/13/2019, no residual disease   (i) 7 of 7 left axillary lymph nodes negative for carcinoma  (total 8 nodes removed)  (d) repeat prognostic panel again estrogen receptor 80% positive with moderate staining, progesterone receptor negative    (9) MammaPrint "low risk" predicts no significant benefit from chemotherapy.   (10) adjuvant radiation 12/26/2019 - 02/06/2020 Site Technique Total Dose (Gy) Dose per Fx (Gy) Completed Fx Beam Energies  Chest Wall, Left: CW_Lt_Bst Electron 10/10 2 5/5 6E  Sclav-LT: SCV_Lt Complex 50/50 2 25/25 6X, 15X  Sclav-LT: SCV_Lt_Bst Complex 8/8 2 4/4 6X, 15X  Chest Wall, Left: CW_Lt 3D 50/50 2 25/25 6X, 10X, 15X   (11) staging studies:   (a) chest, abdomen and pelvic CT with contrast 10/31/2019 shows mild hepatic steatosis, small uterine fibroids, 0.3 cm left upper lobe nodule, but no evidence of metastatic disease.  (b) F18 estradiol PET scan 11/21/2019 shows a chain of very small lymph nodes from the subpectoralis nodal station to the left supraclavicular nodal station positive for ER accumulation with SUVs in the 4 range (versus mediastinal blood pool 1.4). no evidence of lung, bone, or liver involvement.   (12) tamoxifen started 02/29/2020, discontinued DEC 2021 with recurrence  (13) genetics testing 06/27/2020 through the CancerNext-Expanded gene panel offered by [facility] found no deleterious mutations in AIP, ALK, APC*, ATM*, AXIN2, BAP1, BARD1, BLM, BMPR1A, BRCA1*, BRCA2*, BRIP1*, CDC73, CDH1*, CDK4, CDKN1B, CDKN2A, CHEK2*, CTNNA1, DICER1, FANCC, FH, FLCN, GALNT12, KIF1B, LZTR1, MAX, MEN1, MET, MLH1*, MSH2*, MSH3, MSH6*, MUTYH*, NBN, NF1*, NF2, NTHL1, PALB2*, PHOX2B, PMS2*, POT1, PRKAR1A, PTCH1, PTEN*, RAD51C*, RAD51D*, RB1, RECQL, RET, SDHA, SDHAF2, SDHB, SDHC, SDHD, SMAD4, SMARCA4, SMARCB1, SMARCE1, STK11, SUFU, TMEM127, TP53*, TSC1, TSC2, VHL and XRCC2 (sequencing and deletion/duplication); EGFR, EGLN1, HOXB13, KIT, MITF, PDGFRA, POLD1, and POLE (sequencing only); EPCAM and GREM1 (deletion/duplication only). DNA and RNA analyses performed for *  genes. The report date is 06/27/2020.  (14) underwent bilateral implant placement with capsulotomies 03/19/2021 with benign pathology Left - [place] Sm[place] Round High Profile Saline 290 - 350 cc. Ref #[id number].  Serial Number [id number], 350 cc Right - [place] Sm[place] Round High Profile Saline 380 - 450 cc. Ref #[id number].  Serial Number [id number] 287 cc  METASTATIC DISEASE: December 2021 (15) chest and neck CT with contrast 08/02/2020 shows diffuse bony sclerosis consistent with metastatic disease, no evidence of visceral disease  (a) CA 27-29 on 08/05/2020 informative at 98.5  (b) bone marrow biopsy 08/06/2020 showed metastatic carcinoma, estrogen receptor positive (75% moderate/strong), progesterone receptor and HER-2 negative.  (16) denosumab/Xgeva started 08/15/2020  (17) fulvestrant started 08/15/2020  (a) palbociclib started 09/12/2020 at 125 mg per day, 21/7  (b) palbociclib dose to be decreased to 100 mg daily starting with March 2022 cycle  (c) dose dropped to 75 mg daily, 21/7, beginning with May 2022 cycle  (18) bilateral salpingo-oophorectomy 02/06/2021  (19) staging studies:  (A) CT of the neck 08/02/2020 shows decreased size of small left supraclavicular lymph nodes  (B) CT of the chest 08/02/2020 shows no visceral or measurable disease  (C) bone scan 10/02/2020 serves as a baseline for future studies    PLAN: Natasha Graham is a few  months shy of being a year out from definitive diagnosis of metastatic breast cancer.  She has no symptoms directly related to her disease, and specifically no pain.  She has a normal functional status for her age.  Furthermore she is generally tolerating treatment well.  The [facility] is the one that bothers her a little bit more because it makes her feel tired.  She tolerates the injections with minimal side effects.  We reviewed her medications today so she would understand how her drugs work.  We reviewed her CA 27-29 which is encouraging.  She  understands that bone disease is very hard to follow.  She had many bone lesions on the CT scan we obtained at baseline and when we obtained a bone scan in February 2021 these really did not show particularly well.  I think she would benefit from a PET scan and I will try to obtain that for her before she returns to see me in 3 months.  Total encounter time 25 minutes.Sarajane Jews C. Berdena Cisek, MD 04/24/2021 9:57 AM  Oncology and Hematology [facility] [place], [place] [address] Tel. [phone]  Joylene Igo [phone]   I, Wilburn Mylar, am acting as scribe for Dr. Sarajane Jews C. Maryrose Colvin.  I, Lurline Del MD, have reviewed the above documentation for accuracy and completeness, and I agree with the above.   *Total Encounter Time as defined by the Centers for Medicare and Medicaid Services includes, in addition to the face-to-face time of a patient visit (documented in the note above) non-face-to-face time: obtaining and reviewing outside history, ordering and reviewing medications, tests or procedures, care coordination (communications with other health care professionals or caregivers) and documentation in the medical record.

## 2021-04-24 ENCOUNTER — Other Ambulatory Visit: Payer: Self-pay

## 2021-04-24 ENCOUNTER — Inpatient Hospital Stay: Payer: BC Managed Care – PPO | Attending: Oncology

## 2021-04-24 ENCOUNTER — Inpatient Hospital Stay: Payer: BC Managed Care – PPO

## 2021-04-24 ENCOUNTER — Inpatient Hospital Stay (HOSPITAL_BASED_OUTPATIENT_CLINIC_OR_DEPARTMENT_OTHER): Payer: BC Managed Care – PPO | Admitting: Oncology

## 2021-04-24 VITALS — BP 130/93 | HR 101 | Temp 97.8°F | Resp 18 | Ht 64.0 in | Wt 169.0 lb

## 2021-04-24 DIAGNOSIS — Z853 Personal history of malignant neoplasm of breast: Secondary | ICD-10-CM | POA: Diagnosis not present

## 2021-04-24 DIAGNOSIS — C50912 Malignant neoplasm of unspecified site of left female breast: Secondary | ICD-10-CM

## 2021-04-24 DIAGNOSIS — Z90722 Acquired absence of ovaries, bilateral: Secondary | ICD-10-CM | POA: Insufficient documentation

## 2021-04-24 DIAGNOSIS — Z170 Estrogen receptor positive status [ER+]: Secondary | ICD-10-CM

## 2021-04-24 DIAGNOSIS — K760 Fatty (change of) liver, not elsewhere classified: Secondary | ICD-10-CM | POA: Insufficient documentation

## 2021-04-24 DIAGNOSIS — D259 Leiomyoma of uterus, unspecified: Secondary | ICD-10-CM | POA: Insufficient documentation

## 2021-04-24 DIAGNOSIS — Z9013 Acquired absence of bilateral breasts and nipples: Secondary | ICD-10-CM | POA: Diagnosis not present

## 2021-04-24 DIAGNOSIS — C7951 Secondary malignant neoplasm of bone: Secondary | ICD-10-CM | POA: Diagnosis not present

## 2021-04-24 DIAGNOSIS — C50512 Malignant neoplasm of lower-outer quadrant of left female breast: Secondary | ICD-10-CM

## 2021-04-24 DIAGNOSIS — C50812 Malignant neoplasm of overlapping sites of left female breast: Secondary | ICD-10-CM | POA: Diagnosis not present

## 2021-04-24 DIAGNOSIS — Z923 Personal history of irradiation: Secondary | ICD-10-CM | POA: Insufficient documentation

## 2021-04-24 DIAGNOSIS — I700 Atherosclerosis of aorta: Secondary | ICD-10-CM

## 2021-04-24 DIAGNOSIS — Z79899 Other long term (current) drug therapy: Secondary | ICD-10-CM | POA: Diagnosis not present

## 2021-04-24 LAB — COMPREHENSIVE METABOLIC PANEL
ALT: 26 U/L (ref 0–44)
AST: 19 U/L (ref 15–41)
Albumin: 4.1 g/dL (ref 3.5–5.0)
Alkaline Phosphatase: 55 U/L (ref 38–126)
Anion gap: 8 (ref 5–15)
BUN: 11 mg/dL (ref 8–23)
CO2: 30 mmol/L (ref 22–32)
Calcium: 9.7 mg/dL (ref 8.9–10.3)
Chloride: 103 mmol/L (ref 98–111)
Creatinine, Ser: 0.87 mg/dL (ref 0.44–1.00)
GFR, Estimated: >60 mL/min (ref >60)
Glucose, Bld: 106 mg/dL — ABNORMAL HIGH (ref 70–99)
Potassium: 3.5 mmol/L (ref 3.5–5.1)
Sodium: 141 mmol/L (ref 135–145)
Total Bilirubin: 0.4 mg/dL (ref 0.3–1.2)
Total Protein: 7.2 g/dL (ref 6.5–8.1)

## 2021-04-24 LAB — CBC WITH DIFFERENTIAL/PLATELET
Abs Immature Granulocytes: 0.01 10*3/uL (ref 0.00–0.07)
Basophils Absolute: 0 10*3/uL (ref 0.0–0.1)
Basophils Relative: 1 %
Eosinophils Absolute: 0 10*3/uL (ref 0.0–0.5)
Eosinophils Relative: 2 %
HCT: 35.9 % — ABNORMAL LOW (ref 36.0–46.0)
Hemoglobin: 12.9 g/dL (ref 12.0–15.0)
Immature Granulocytes: 0 %
Lymphocytes Relative: 39 %
Lymphs Abs: 1.1 10*3/uL (ref 0.7–4.0)
MCH: 35.6 pg — ABNORMAL HIGH (ref 26.0–34.0)
MCHC: 35.9 g/dL (ref 30.0–36.0)
MCV: 99.2 fL (ref 80.0–100.0)
Monocytes Absolute: 0.4 10*3/uL (ref 0.1–1.0)
Monocytes Relative: 14 %
Neutro Abs: 1.2 10*3/uL — ABNORMAL LOW (ref 1.7–7.7)
Neutrophils Relative %: 44 %
Platelets: 239 10*3/uL (ref 150–400)
RBC: 3.62 MIL/uL — ABNORMAL LOW (ref 3.87–5.11)
RDW: 13.1 % (ref 11.5–15.5)
WBC: 2.7 10*3/uL — ABNORMAL LOW (ref 4.0–10.5)
nRBC: 0 % (ref 0.0–0.2)

## 2021-04-24 MED ORDER — FULVESTRANT 250 MG/5ML IM SOLN
500.0000 mg | Freq: Once | INTRAMUSCULAR | Status: AC
  Administered 2021-04-24: 500 mg via INTRAMUSCULAR
  Filled 2021-04-24: qty 10

## 2021-04-24 MED ORDER — DENOSUMAB 120 MG/1.7ML ~~LOC~~ SOLN
120.0000 mg | Freq: Once | SUBCUTANEOUS | Status: AC
  Administered 2021-04-24: 120 mg via SUBCUTANEOUS
  Filled 2021-04-24: qty 1.7

## 2021-04-24 NOTE — Patient Instructions (Signed)
Fulvestrant injection What is this medication? FULVESTRANT (ful VES trant) blocks the effects of estrogen. It is used to treat breast cancer. This medicine may be used for other purposes; ask your health care provider or pharmacist if you have questions. COMMON BRAND NAME(S): FASLODEX What should I tell my care team before I take this medication? They need to know if you have any of these conditions: bleeding disorders liver disease low blood counts, like low white cell, platelet, or red cell counts an unusual or allergic reaction to fulvestrant, other medicines, foods, dyes, or preservatives pregnant or trying to get pregnant breast-feeding How should I use this medication? This medicine is for injection into a muscle. It is usually given by a health care professional in a hospital or clinic setting. Talk to your pediatrician regarding the use of this medicine in children. Special care may be needed. Overdosage: If you think you have taken too much of this medicine contact a poison control center or emergency room at once. NOTE: This medicine is only for you. Do not share this medicine with others. What if I miss a dose? It is important not to miss your dose. Call your doctor or health care professional if you are unable to keep an appointment. What may interact with this medication? medicines that treat or prevent blood clots like warfarin, enoxaparin, dalteparin, apixaban, dabigatran, and rivaroxaban This list may not describe all possible interactions. Give your health care provider a list of all the medicines, herbs, non-prescription drugs, or dietary supplements you use. Also tell them if you smoke, drink alcohol, or use illegal drugs. Some items may interact with your medicine. What should I watch for while using this medication? Your condition will be monitored carefully while you are receiving this medicine. You will need important blood work done while you are taking this  medicine. Do not become pregnant while taking this medicine or for at least 1 year after stopping it. Women of child-bearing potential will need to have a negative pregnancy test before starting this medicine. Women should inform their doctor if they wish to become pregnant or think they might be pregnant. There is a potential for serious side effects to an unborn child. Men should inform their doctors if they wish to father a child. This medicine may lower sperm counts. Talk to your health care professional or pharmacist for more information. Do not breast-feed an infant while taking this medicine or for 1 year after the last dose. What side effects may I notice from receiving this medication? Side effects that you should report to your doctor or health care professional as soon as possible: allergic reactions like skin rash, itching or hives, swelling of the face, lips, or tongue feeling faint or lightheaded, falls pain, tingling, numbness, or weakness in the legs signs and symptoms of infection like fever or chills; cough; flu-like symptoms; sore throat vaginal bleeding Side effects that usually do not require medical attention (report to your doctor or health care professional if they continue or are bothersome): aches, pains constipation diarrhea headache hot flashes nausea, vomiting pain at site where injected stomach pain This list may not describe all possible side effects. Call your doctor for medical advice about side effects. You may report side effects to FDA at 1-800-FDA-1088. Where should I keep my medication? This drug is given in a hospital or clinic and will not be stored at home. NOTE: This sheet is a summary. It may not cover all possible information. If you have   questions about this medicine, talk to your doctor, pharmacist, or health care provider. Denosumab injection What is this medication? DENOSUMAB (den oh sue mab) slows bone breakdown. Prolia is used to treat  osteoporosis in women after menopause and in men, and in people who are taking corticosteroids for 6 months or more. Xgeva is used to treat a high calcium level due to cancer and to prevent bone fractures and other bone problems caused by multiple myeloma or cancer bone metastases. Xgeva is also used to treat giant cell tumor of the bone. This medicine may be used for other purposes; ask your health care provider or pharmacist if you have questions. COMMON BRAND NAME(S): Prolia, XGEVA What should I tell my care team before I take this medication? They need to know if you have any of these conditions: dental disease having surgery or tooth extraction infection kidney disease low levels of calcium or Vitamin D in the blood malnutrition on hemodialysis skin conditions or sensitivity thyroid or parathyroid disease an unusual reaction to denosumab, other medicines, foods, dyes, or preservatives pregnant or trying to get pregnant breast-feeding How should I use this medication? This medicine is for injection under the skin. It is given by a health care professional in a hospital or clinic setting. A special MedGuide will be given to you before each treatment. Be sure to read this information carefully each time. For Prolia, talk to your pediatrician regarding the use of this medicine in children. Special care may be needed. For Xgeva, talk to your pediatrician regarding the use of this medicine in children. While this drug may be prescribed for children as young as 13 years for selected conditions, precautions do apply. Overdosage: If you think you have taken too much of this medicine contact a poison control center or emergency room at once. NOTE: This medicine is only for you. Do not share this medicine with others. What if I miss a dose? It is important not to miss your dose. Call your doctor or health care professional if you are unable to keep an appointment. What may interact with this  medication? Do not take this medicine with any of the following medications: other medicines containing denosumab This medicine may also interact with the following medications: medicines that lower your chance of fighting infection steroid medicines like prednisone or cortisone This list may not describe all possible interactions. Give your health care provider a list of all the medicines, herbs, non-prescription drugs, or dietary supplements you use. Also tell them if you smoke, drink alcohol, or use illegal drugs. Some items may interact with your medicine. What should I watch for while using this medication? Visit your doctor or health care professional for regular checks on your progress. Your doctor or health care professional may order blood tests and other tests to see how you are doing. Call your doctor or health care professional for advice if you get a fever, chills or sore throat, or other symptoms of a cold or flu. Do not treat yourself. This drug may decrease your body's ability to fight infection. Try to avoid being around people who are sick. You should make sure you get enough calcium and vitamin D while you are taking this medicine, unless your doctor tells you not to. Discuss the foods you eat and the vitamins you take with your health care professional. See your dentist regularly. Brush and floss your teeth as directed. Before you have any dental work done, tell your dentist you are receiving this   medicine. Do not become pregnant while taking this medicine or for 5 months after stopping it. Talk with your doctor or health care professional about your birth control options while taking this medicine. Women should inform their doctor if they wish to become pregnant or think they might be pregnant. There is a potential for serious side effects to an unborn child. Talk to your health care professional or pharmacist for more information. What side effects may I notice from receiving this  medication? Side effects that you should report to your doctor or health care professional as soon as possible: allergic reactions like skin rash, itching or hives, swelling of the face, lips, or tongue bone pain breathing problems dizziness jaw pain, especially after dental work redness, blistering, peeling of the skin signs and symptoms of infection like fever or chills; cough; sore throat; pain or trouble passing urine signs of low calcium like fast heartbeat, muscle cramps or muscle pain; pain, tingling, numbness in the hands or feet; seizures unusual bleeding or bruising unusually weak or tired Side effects that usually do not require medical attention (report to your doctor or health care professional if they continue or are bothersome): constipation diarrhea headache joint pain loss of appetite muscle pain runny nose tiredness upset stomach This list may not describe all possible side effects. Call your doctor for medical advice about side effects. You may report side effects to FDA at 1-800-FDA-1088. Where should I keep my medication? This medicine is only given in a clinic, doctor's office, or other health care setting and will not be stored at home. NOTE: This sheet is a summary. It may not cover all possible information. If you have questions about this medicine, talk to your doctor, pharmacist, or health care provider.  2022 Elsevier/Gold Standard (2017-12-24 16:10:44)   2022 [employer]vier/Gold Standard (2017-11-25 11:34:41)  

## 2021-04-25 LAB — CANCER ANTIGEN 27.29: CA 27.29: 55.5 U/mL — ABNORMAL HIGH (ref 0.0–38.6)

## 2021-05-02 ENCOUNTER — Other Ambulatory Visit (HOSPITAL_COMMUNITY): Payer: Self-pay

## 2021-05-16 ENCOUNTER — Other Ambulatory Visit (HOSPITAL_COMMUNITY): Payer: Self-pay

## 2021-05-16 IMAGING — CT CT CHEST W/ CM
3 of 5 series · 14 of 36 positions shown, 17 images · IV contrast (OMNIPAQUE)
Comparison: None.

CLINICAL DATA: Left breast carcinoma. Recent left mastectomy.
Staging.

EXAM:
CT CHEST, ABDOMEN, AND PELVIS WITH CONTRAST
TECHNIQUE: Multidetector CT imaging of the chest, abdomen and pelvis was
performed following the standard protocol during bolus
administration of intravenous contrast.
CONTRAST:  100mL OMNIPAQUE IOHEXOL 300 MG/ML  SOLN

[Series 2: cap with · axial · 0.78mm/px · z∈[+1283,+1733]mm · 9 of 114 slices shown, 12 images]
[im 12/114  mediastinal]
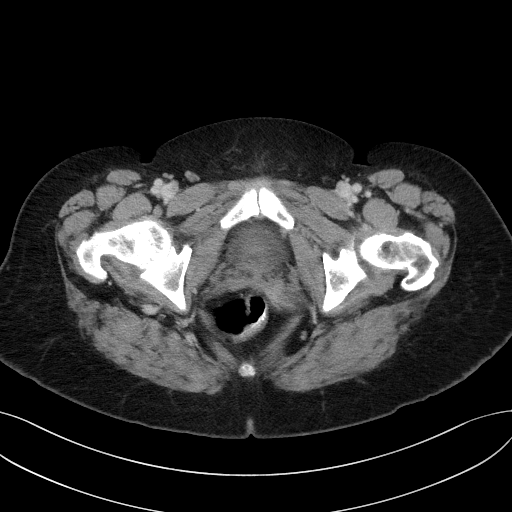
[im 12/114  lung]
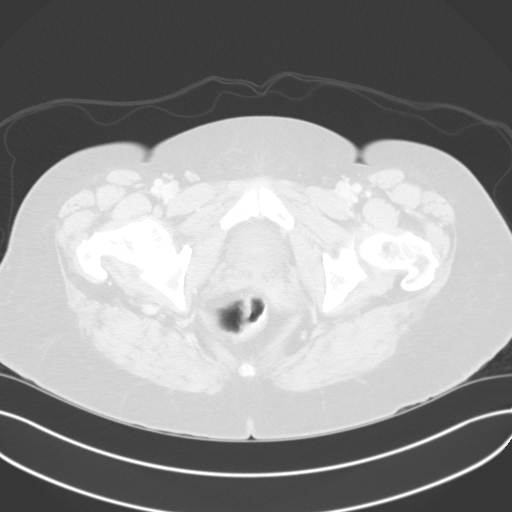
[im 23/114  lung]
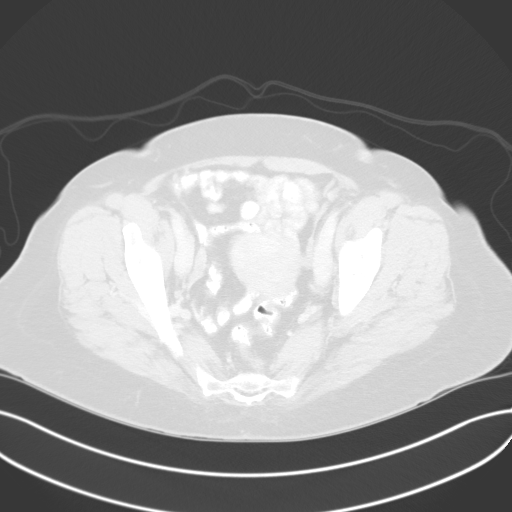
[im 34/114  lung]
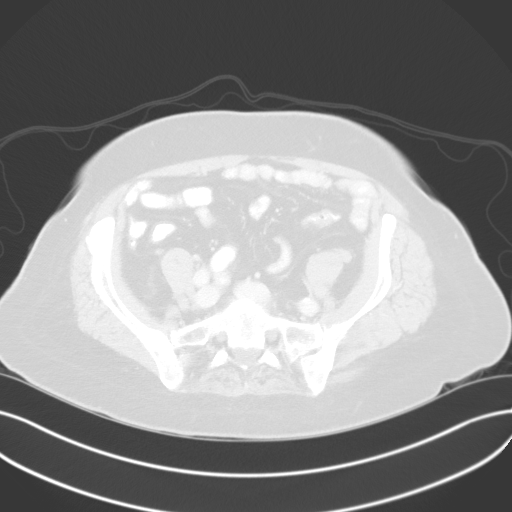
[im 46/114  lung]
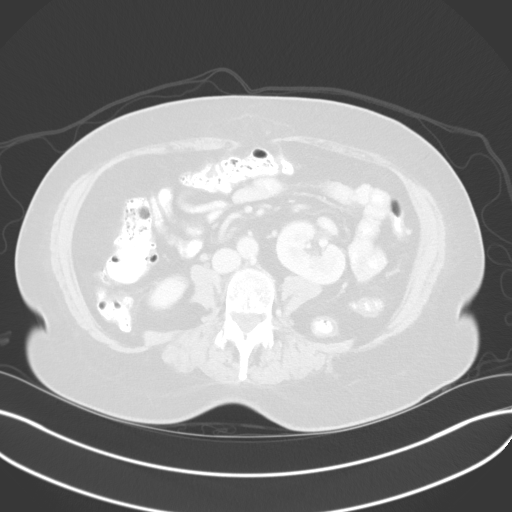
[im 57/114  mediastinal]
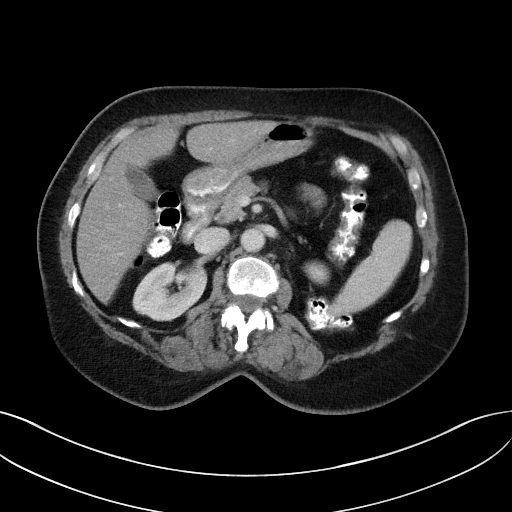
[im 57/114  lung]
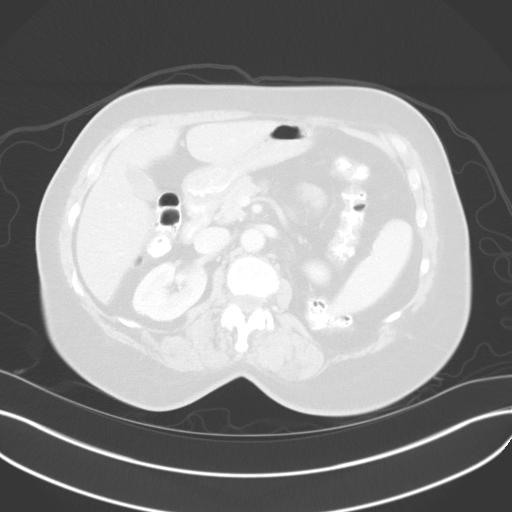
[im 68/114  lung]
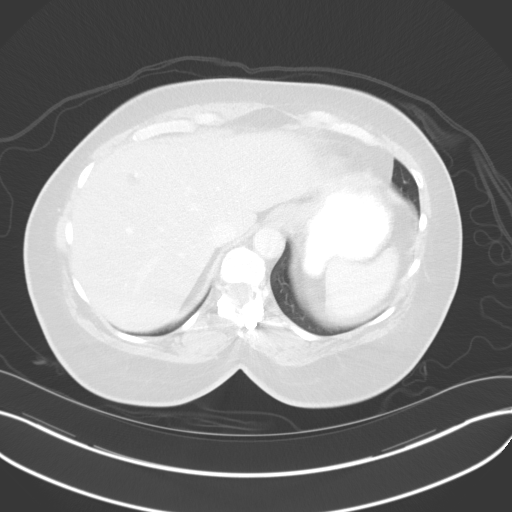
[im 80/114  lung]
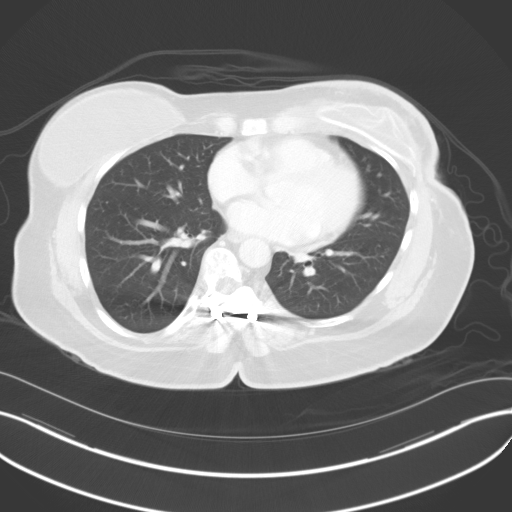
[im 91/114  lung]
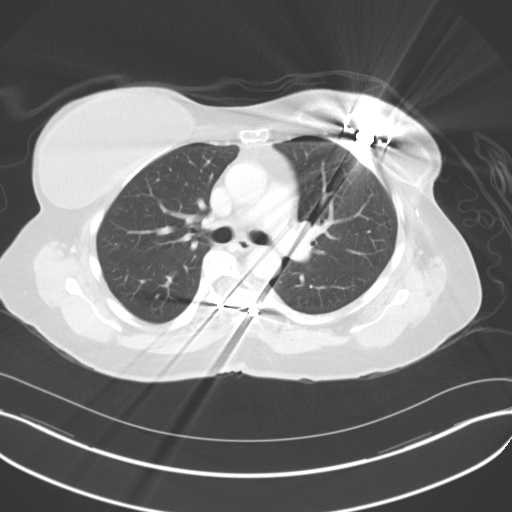
[im 102/114  mediastinal]
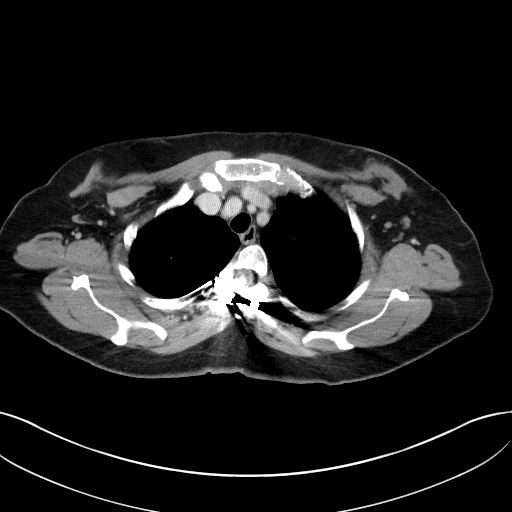
[im 102/114  lung]
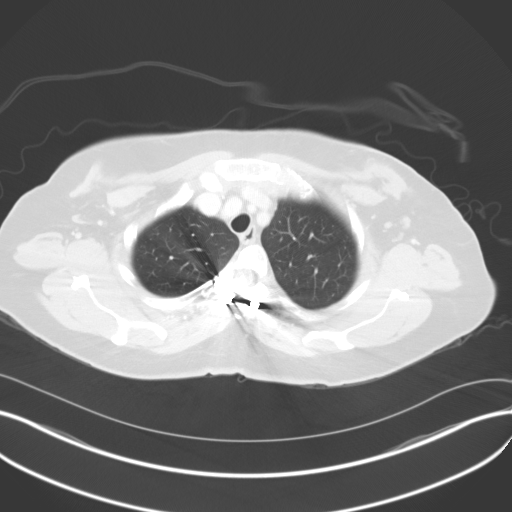

[Series 4: coronals · coronal · 0.74mm/px · 3 of 127 slices shown]
[im 26/127  lung]
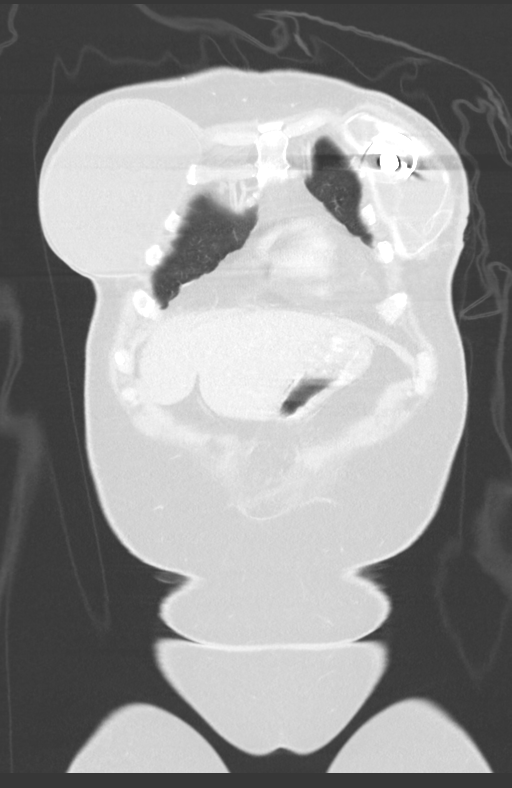
[im 51/127  lung]
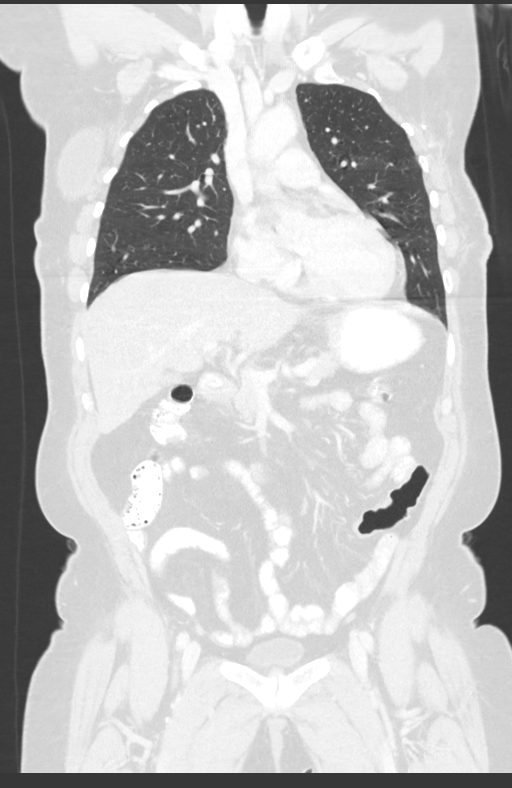
[im 76/127  lung]
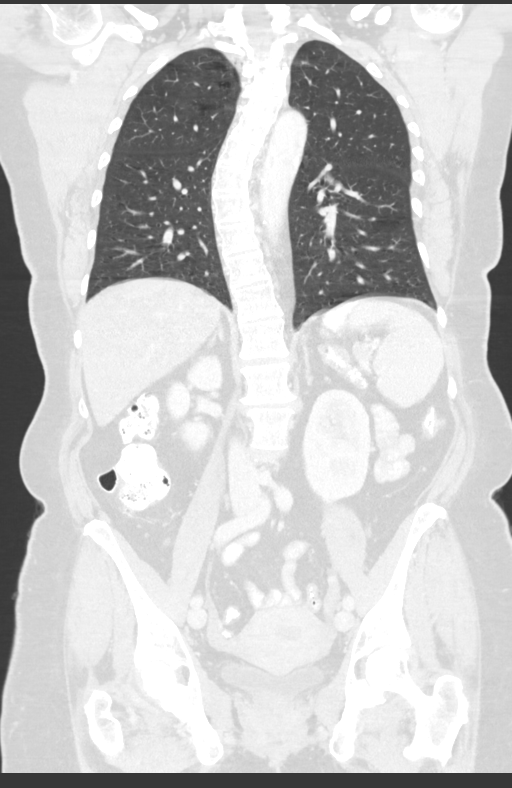

[Series 6: lung · axial · 0.78mm/px · z∈[+1559,+1601]mm · 2 of 128 slices shown]
[im 11/128  lung]
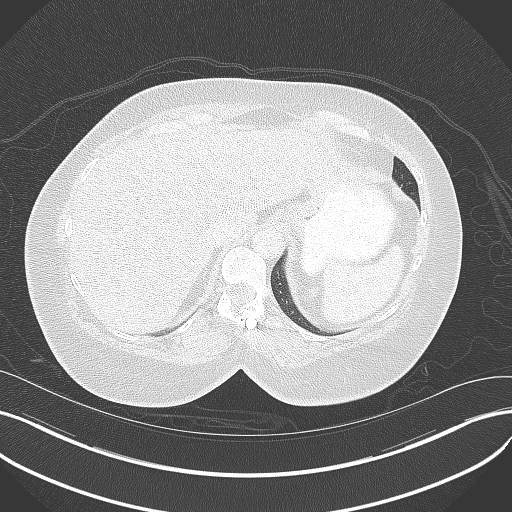
[im 32/128  lung]
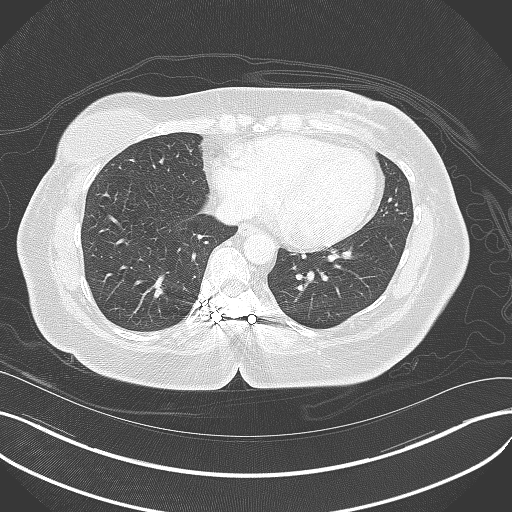

[14 of 36 positions shown; findings below may reference images not displayed]

FINDINGS: CT CHEST FINDINGS

Cardiovascular: No acute findings. Aortic atherosclerosis
incidentally noted.

Mediastinum/Lymph Nodes: No masses or pathologically enlarged lymph
nodes identified. Bilateral breast implants are noted, and no
axillary lymphadenopathy identified.

Lungs/Pleura: 3 mm pulmonary nodule seen in the medial left lung
apex on image [DATE]. No other suspicious pulmonary nodules or masses
identified. No evidence of pulmonary infiltrate or pleural effusion.

Musculoskeletal: No suspicious bone lesions identified. Thoracic
posterior spinal fixation rods noted.

CT ABDOMEN AND PELVIS FINDINGS

Hepatobiliary: No masses identified. Two small cysts are noted. Mild
hepatic steatosis also demonstrated. Gallbladder is unremarkable. No
evidence of biliary ductal dilatation.

Pancreas:  No mass or inflammatory changes.

Spleen:  Within normal limits in size and appearance.

Adrenals/Urinary tract: No masses or hydronephrosis. Ptotic left
kidney noted which is located in the left lower quadrant.

Stomach/Bowel: No evidence of obstruction, inflammatory process, or
abnormal fluid collections. Normal appendix visualized.

Vascular/Lymphatic: No pathologically enlarged lymph nodes
identified. No abdominal aortic aneurysm.

Reproductive: 2 small uterine fibroids noted, largest measuring
cm. Adnexal regions are unremarkable.

Other: Small midline epigastric ventral hernia containing only
omental fat.

Musculoskeletal:  No suspicious bone lesions identified.
IMPRESSION: 1. No definite evidence of metastatic disease within the chest,
abdomen, or pelvis.
2. 3 mm indeterminate left upper lobe pulmonary nodule. Recommend
continued followup by chest CT in 6 months.
3. Mild hepatic steatosis.
4. Small uterine fibroids.
5. Small epigastric ventral hernia containing only omental fat.

## 2021-05-16 IMAGING — CT CT ABD-PELV W/ CM
3 of 5 series · 14 of 36 positions shown, 17 images · IV contrast (omnipaque)
Comparison: None.

CLINICAL DATA: Left breast carcinoma. Recent left mastectomy.
Staging.

EXAM:
CT CHEST, ABDOMEN, AND PELVIS WITH CONTRAST
TECHNIQUE: Multidetector CT imaging of the chest, abdomen and pelvis was
performed following the standard protocol during bolus
administration of intravenous contrast.
CONTRAST:  100mL OMNIPAQUE IOHEXOL 300 MG/ML  SOLN

[Series 2: cap with · axial · 0.78mm/px · z∈[+1283,+1733]mm · 9 of 114 slices shown, 12 images]
[im 12/114  mediastinal]
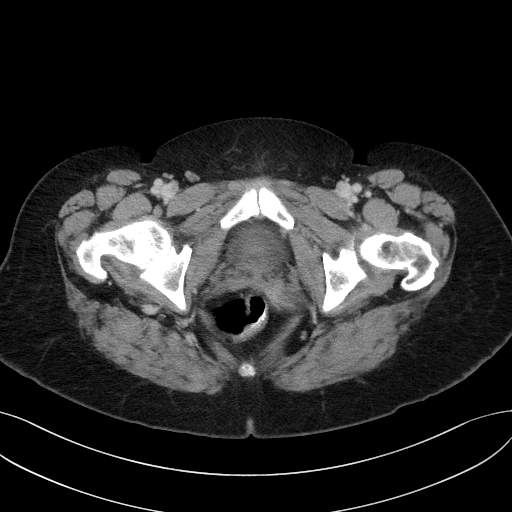
[im 12/114  lung]
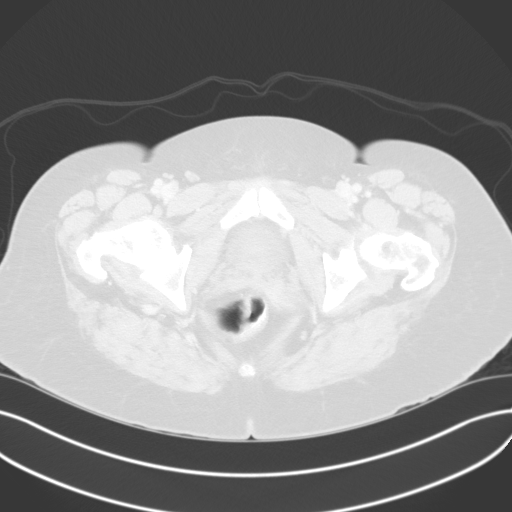
[im 23/114  lung]
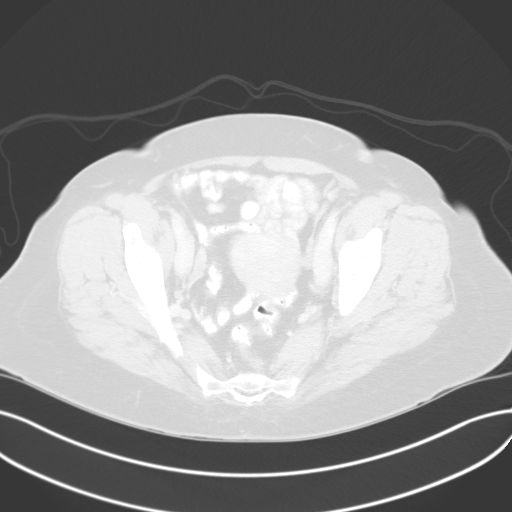
[im 34/114  lung]
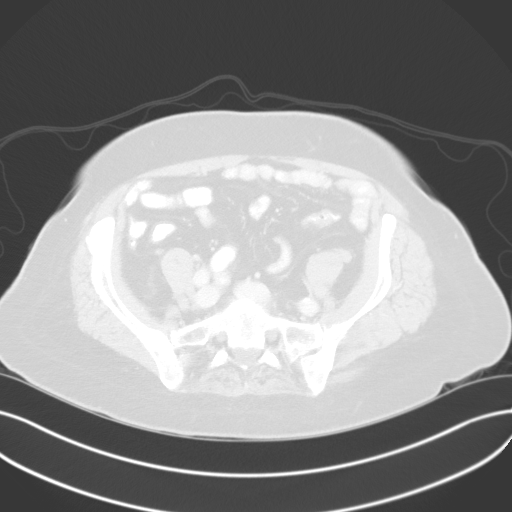
[im 46/114  lung]
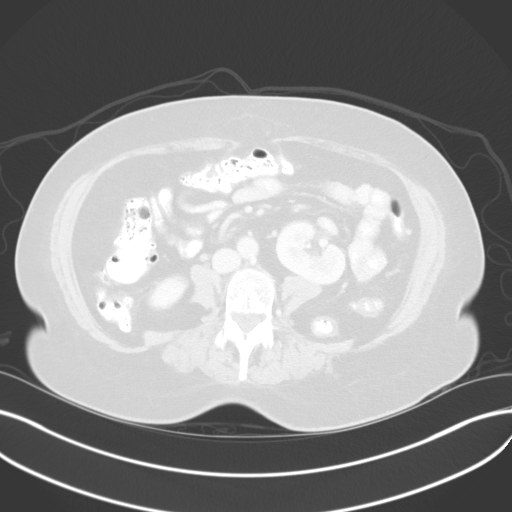
[im 57/114  mediastinal]
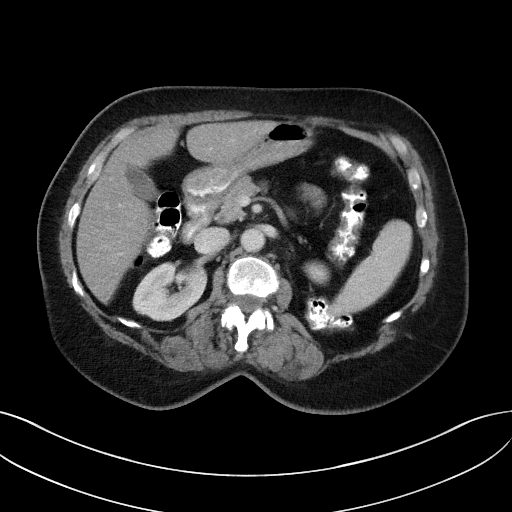
[im 57/114  lung]
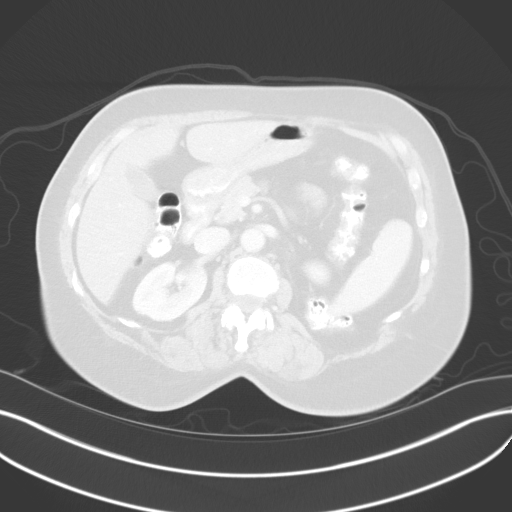
[im 68/114  lung]
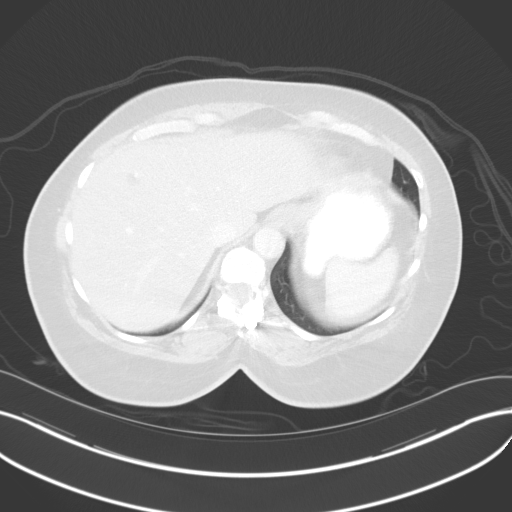
[im 80/114  lung]
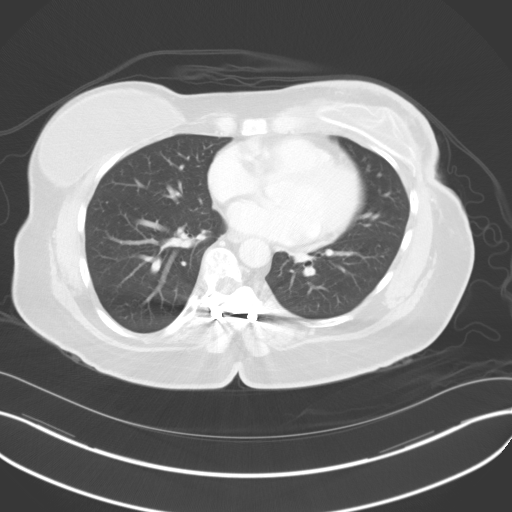
[im 91/114  lung]
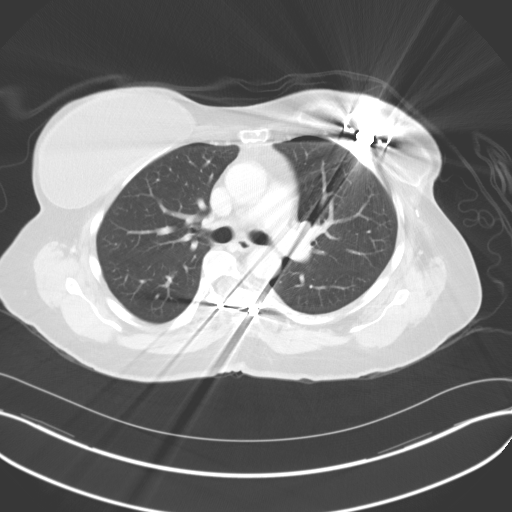
[im 102/114  mediastinal]
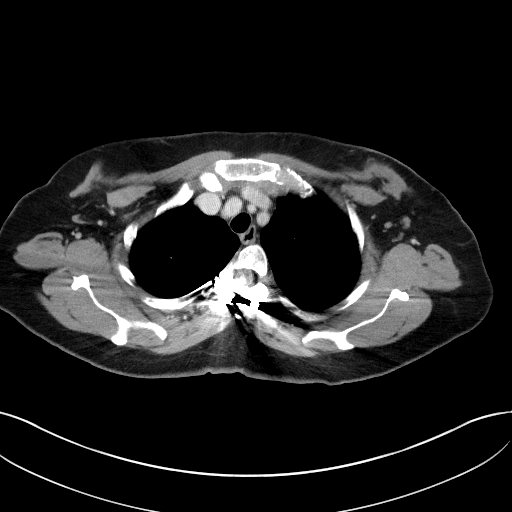
[im 102/114  lung]
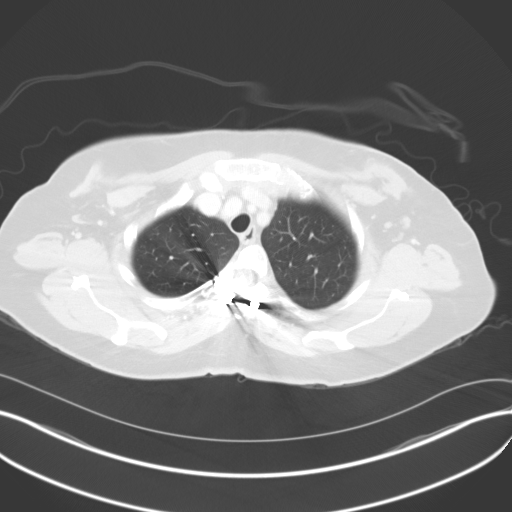

[Series 4: coronals · coronal · 0.74mm/px · 3 of 127 slices shown]
[im 26/127  lung]
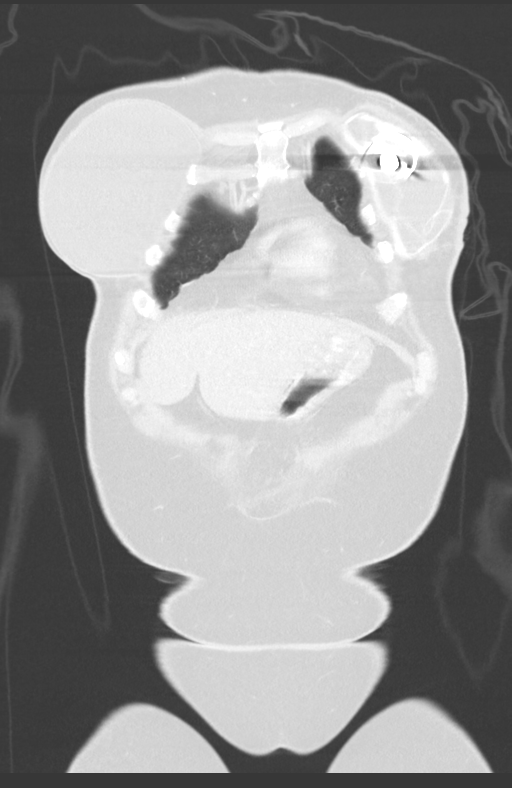
[im 51/127  lung]
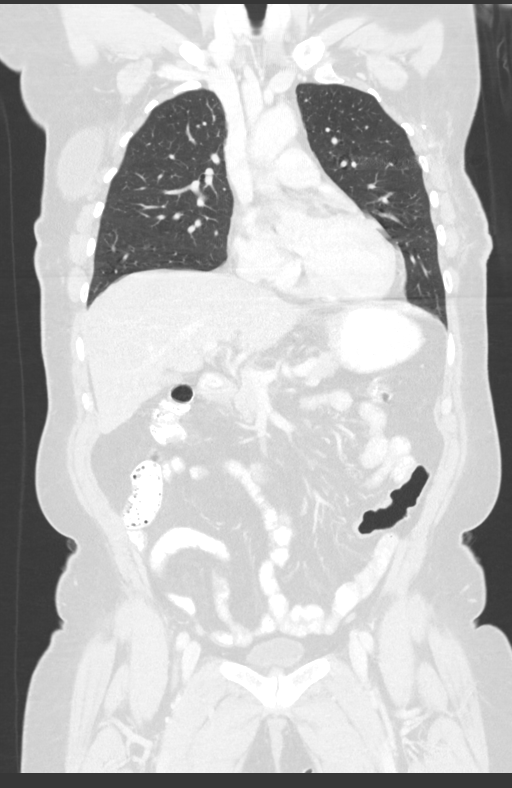
[im 76/127  lung]
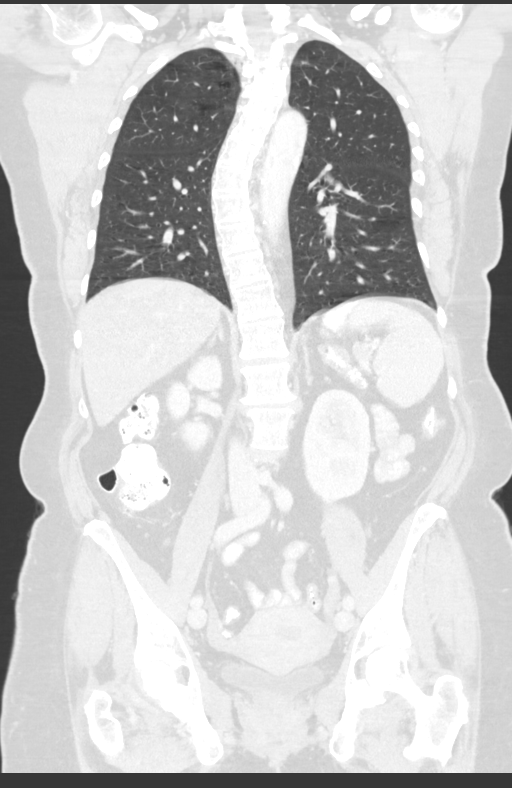

[Series 6: lung · axial · 0.78mm/px · z∈[+1559,+1601]mm · 2 of 128 slices shown]
[im 11/128  lung]
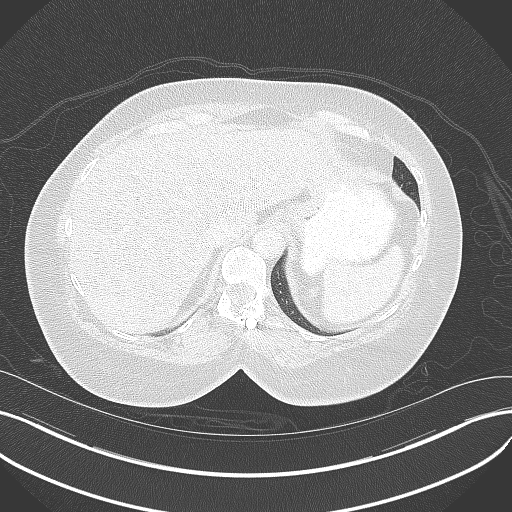
[im 32/128  lung]
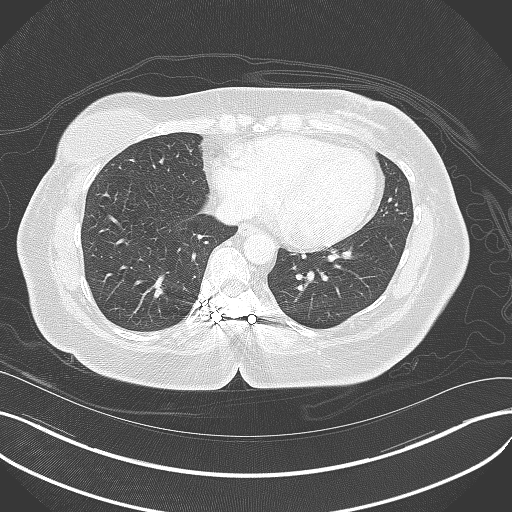

[14 of 36 positions shown; findings below may reference images not displayed]

FINDINGS: CT CHEST FINDINGS

Cardiovascular: No acute findings. Aortic atherosclerosis
incidentally noted.

Mediastinum/Lymph Nodes: No masses or pathologically enlarged lymph
nodes identified. Bilateral breast implants are noted, and no
axillary lymphadenopathy identified.

Lungs/Pleura: 3 mm pulmonary nodule seen in the medial left lung
apex on image [DATE]. No other suspicious pulmonary nodules or masses
identified. No evidence of pulmonary infiltrate or pleural effusion.

Musculoskeletal: No suspicious bone lesions identified. Thoracic
posterior spinal fixation rods noted.

CT ABDOMEN AND PELVIS FINDINGS

Hepatobiliary: No masses identified. Two small cysts are noted. Mild
hepatic steatosis also demonstrated. Gallbladder is unremarkable. No
evidence of biliary ductal dilatation.

Pancreas:  No mass or inflammatory changes.

Spleen:  Within normal limits in size and appearance.

Adrenals/Urinary tract: No masses or hydronephrosis. Ptotic left
kidney noted which is located in the left lower quadrant.

Stomach/Bowel: No evidence of obstruction, inflammatory process, or
abnormal fluid collections. Normal appendix visualized.

Vascular/Lymphatic: No pathologically enlarged lymph nodes
identified. No abdominal aortic aneurysm.

Reproductive: 2 small uterine fibroids noted, largest measuring
cm. Adnexal regions are unremarkable.

Other: Small midline epigastric ventral hernia containing only
omental fat.

Musculoskeletal:  No suspicious bone lesions identified.
IMPRESSION: 1. No definite evidence of metastatic disease within the chest,
abdomen, or pelvis.
2. 3 mm indeterminate left upper lobe pulmonary nodule. Recommend
continued followup by chest CT in 6 months.
3. Mild hepatic steatosis.
4. Small uterine fibroids.
5. Small epigastric ventral hernia containing only omental fat.

## 2021-05-21 ENCOUNTER — Other Ambulatory Visit: Payer: Self-pay | Admitting: Oncology

## 2021-05-21 ENCOUNTER — Inpatient Hospital Stay: Payer: BC Managed Care – PPO | Attending: Oncology

## 2021-05-21 ENCOUNTER — Other Ambulatory Visit: Payer: Self-pay

## 2021-05-21 ENCOUNTER — Inpatient Hospital Stay: Payer: BC Managed Care – PPO

## 2021-05-21 VITALS — BP 160/88 | HR 85 | Temp 98.3°F | Resp 18

## 2021-05-21 DIAGNOSIS — C50512 Malignant neoplasm of lower-outer quadrant of left female breast: Secondary | ICD-10-CM

## 2021-05-21 DIAGNOSIS — C7951 Secondary malignant neoplasm of bone: Secondary | ICD-10-CM

## 2021-05-21 DIAGNOSIS — I700 Atherosclerosis of aorta: Secondary | ICD-10-CM

## 2021-05-21 DIAGNOSIS — Z79899 Other long term (current) drug therapy: Secondary | ICD-10-CM | POA: Diagnosis not present

## 2021-05-21 DIAGNOSIS — C50812 Malignant neoplasm of overlapping sites of left female breast: Secondary | ICD-10-CM | POA: Diagnosis not present

## 2021-05-21 DIAGNOSIS — K760 Fatty (change of) liver, not elsewhere classified: Secondary | ICD-10-CM

## 2021-05-21 DIAGNOSIS — C50912 Malignant neoplasm of unspecified site of left female breast: Secondary | ICD-10-CM

## 2021-05-21 LAB — CBC WITH DIFFERENTIAL/PLATELET
Abs Immature Granulocytes: 0.02 10*3/uL (ref 0.00–0.07)
Basophils Absolute: 0.1 10*3/uL (ref 0.0–0.1)
Basophils Relative: 2 %
Eosinophils Absolute: 0 10*3/uL (ref 0.0–0.5)
Eosinophils Relative: 2 %
HCT: 33.4 % — ABNORMAL LOW (ref 36.0–46.0)
Hemoglobin: 12.1 g/dL (ref 12.0–15.0)
Immature Granulocytes: 1 %
Lymphocytes Relative: 34 %
Lymphs Abs: 0.8 10*3/uL (ref 0.7–4.0)
MCH: 35.5 pg — ABNORMAL HIGH (ref 26.0–34.0)
MCHC: 36.2 g/dL — ABNORMAL HIGH (ref 30.0–36.0)
MCV: 97.9 fL (ref 80.0–100.0)
Monocytes Absolute: 0.4 10*3/uL (ref 0.1–1.0)
Monocytes Relative: 16 %
Neutro Abs: 1 10*3/uL — ABNORMAL LOW (ref 1.7–7.7)
Neutrophils Relative %: 45 %
Platelets: 217 10*3/uL (ref 150–400)
RBC: 3.41 MIL/uL — ABNORMAL LOW (ref 3.87–5.11)
RDW: 13.1 % (ref 11.5–15.5)
WBC: 2.3 10*3/uL — ABNORMAL LOW (ref 4.0–10.5)
nRBC: 0 % (ref 0.0–0.2)

## 2021-05-21 LAB — COMPREHENSIVE METABOLIC PANEL
ALT: 25 U/L (ref 0–44)
AST: 19 U/L (ref 15–41)
Albumin: 4 g/dL (ref 3.5–5.0)
Alkaline Phosphatase: 51 U/L (ref 38–126)
Anion gap: 11 (ref 5–15)
BUN: 9 mg/dL (ref 8–23)
CO2: 27 mmol/L (ref 22–32)
Calcium: 9.6 mg/dL (ref 8.9–10.3)
Chloride: 104 mmol/L (ref 98–111)
Creatinine, Ser: 0.83 mg/dL (ref 0.44–1.00)
GFR, Estimated: >60 mL/min (ref >60)
Glucose, Bld: 108 mg/dL — ABNORMAL HIGH (ref 70–99)
Potassium: 3.5 mmol/L (ref 3.5–5.1)
Sodium: 142 mmol/L (ref 135–145)
Total Bilirubin: 0.4 mg/dL (ref 0.3–1.2)
Total Protein: 6.6 g/dL (ref 6.5–8.1)

## 2021-05-21 MED ORDER — DENOSUMAB 120 MG/1.7ML ~~LOC~~ SOLN
120.0000 mg | Freq: Once | SUBCUTANEOUS | Status: AC
  Administered 2021-05-21: 120 mg via SUBCUTANEOUS
  Filled 2021-05-21: qty 1.7

## 2021-05-21 MED ORDER — FULVESTRANT 250 MG/5ML IM SOLN
500.0000 mg | Freq: Once | INTRAMUSCULAR | Status: AC
  Administered 2021-05-21: 500 mg via INTRAMUSCULAR
  Filled 2021-05-21: qty 10

## 2021-05-21 NOTE — Patient Instructions (Addendum)
Fulvestrant injection °What is this medication? °FULVESTRANT (ful VES trant) blocks the effects of estrogen. It is used to treat breast cancer. °This medicine may be used for other purposes; ask your health care provider or pharmacist if you have questions. °COMMON BRAND NAME(S): FASLODEX °What should I tell my care team before I take this medication? °They need to know if you have any of these conditions: °bleeding disorders °liver disease °low blood counts, like low white cell, platelet, or red cell counts °an unusual or allergic reaction to fulvestrant, other medicines, foods, dyes, or preservatives °pregnant or trying to get pregnant °breast-feeding °How should I use this medication? °This medicine is for injection into a muscle. It is usually given by a health care professional in a hospital or clinic setting. °Talk to your pediatrician regarding the use of this medicine in children. Special care may be needed. °Overdosage: If you think you have taken too much of this medicine contact a poison control center or emergency room at once. °NOTE: This medicine is only for you. Do not share this medicine with others. °What if I miss a dose? °It is important not to miss your dose. Call your doctor or health care professional if you are unable to keep an appointment. °What may interact with this medication? °medicines that treat or prevent blood clots like warfarin, enoxaparin, dalteparin, apixaban, dabigatran, and rivaroxaban °This list may not describe all possible interactions. Give your health care provider a list of all the medicines, herbs, non-prescription drugs, or dietary supplements you use. Also tell them if you smoke, drink alcohol, or use illegal drugs. Some items may interact with your medicine. °What should I watch for while using this medication? °Your condition will be monitored carefully while you are receiving this medicine. You will need important blood work done while you are taking this  medicine. °Do not become pregnant while taking this medicine or for at least 1 year after stopping it. Women of child-bearing potential will need to have a negative pregnancy test before starting this medicine. Women should inform their doctor if they wish to become pregnant or think they might be pregnant. There is a potential for serious side effects to an unborn child. Men should inform their doctors if they wish to father a child. This medicine may lower sperm counts. Talk to your health care professional or pharmacist for more information. Do not breast-feed an infant while taking this medicine or for 1 year after the last dose. °What side effects may I notice from receiving this medication? °Side effects that you should report to your doctor or health care professional as soon as possible: °allergic reactions like skin rash, itching or hives, swelling of the face, lips, or tongue °feeling faint or lightheaded, falls °pain, tingling, numbness, or weakness in the legs °signs and symptoms of infection like fever or chills; cough; flu-like symptoms; sore throat °vaginal bleeding °Side effects that usually do not require medical attention (report to your doctor or health care professional if they continue or are bothersome): °aches, pains °constipation °diarrhea °headache °hot flashes °nausea, vomiting °pain at site where injected °stomach pain °This list may not describe all possible side effects. Call your doctor for medical advice about side effects. You may report side effects to FDA at 1-800-FDA-1088. °Where should I keep my medication? °This drug is given in a hospital or clinic and will not be stored at home. °NOTE: This sheet is a summary. It may not cover all possible information. If you have   questions about this medicine, talk to your doctor, pharmacist, or health care provider.  2022 Elsevier/Gold Standard (2017-11-25 11:34:41) Denosumab injection What is this medication? DENOSUMAB (den oh sue mab)  slows bone breakdown. Prolia is used to treat osteoporosis in women after menopause and in men, and in people who are taking corticosteroids for 6 months or more. Xgeva is used to treat a high calcium level due to cancer and to prevent bone fractures and other bone problems caused by multiple myeloma or cancer bone metastases. Xgeva is also used to treat giant cell tumor of the bone. This medicine may be used for other purposes; ask your health care provider or pharmacist if you have questions. COMMON BRAND NAME(S): Prolia, XGEVA What should I tell my care team before I take this medication? They need to know if you have any of these conditions: dental disease having surgery or tooth extraction infection kidney disease low levels of calcium or Vitamin D in the blood malnutrition on hemodialysis skin conditions or sensitivity thyroid or parathyroid disease an unusual reaction to denosumab, other medicines, foods, dyes, or preservatives pregnant or trying to get pregnant breast-feeding How should I use this medication? This medicine is for injection under the skin. It is given by a health care professional in a hospital or clinic setting. A special MedGuide will be given to you before each treatment. Be sure to read this information carefully each time. For Prolia, talk to your pediatrician regarding the use of this medicine in children. Special care may be needed. For Xgeva, talk to your pediatrician regarding the use of this medicine in children. While this drug may be prescribed for children as young as 13 years for selected conditions, precautions do apply. Overdosage: If you think you have taken too much of this medicine contact a poison control center or emergency room at once. NOTE: This medicine is only for you. Do not share this medicine with others. What if I miss a dose? It is important not to miss your dose. Call your doctor or health care professional if you are unable to keep an  appointment. What may interact with this medication? Do not take this medicine with any of the following medications: other medicines containing denosumab This medicine may also interact with the following medications: medicines that lower your chance of fighting infection steroid medicines like prednisone or cortisone This list may not describe all possible interactions. Give your health care provider a list of all the medicines, herbs, non-prescription drugs, or dietary supplements you use. Also tell them if you smoke, drink alcohol, or use illegal drugs. Some items may interact with your medicine. What should I watch for while using this medication? Visit your doctor or health care professional for regular checks on your progress. Your doctor or health care professional may order blood tests and other tests to see how you are doing. Call your doctor or health care professional for advice if you get a fever, chills or sore throat, or other symptoms of a cold or flu. Do not treat yourself. This drug may decrease your body's ability to fight infection. Try to avoid being around people who are sick. You should make sure you get enough calcium and vitamin D while you are taking this medicine, unless your doctor tells you not to. Discuss the foods you eat and the vitamins you take with your health care professional. See your dentist regularly. Brush and floss your teeth as directed. Before you have any dental work done, tell   your dentist you are receiving this medicine. Do not become pregnant while taking this medicine or for 5 months after stopping it. Talk with your doctor or health care professional about your birth control options while taking this medicine. Women should inform their doctor if they wish to become pregnant or think they might be pregnant. There is a potential for serious side effects to an unborn child. Talk to your health care professional or pharmacist for more information. What side  effects may I notice from receiving this medication? Side effects that you should report to your doctor or health care professional as soon as possible: allergic reactions like skin rash, itching or hives, swelling of the face, lips, or tongue bone pain breathing problems dizziness jaw pain, especially after dental work redness, blistering, peeling of the skin signs and symptoms of infection like fever or chills; cough; sore throat; pain or trouble passing urine signs of low calcium like fast heartbeat, muscle cramps or muscle pain; pain, tingling, numbness in the hands or feet; seizures unusual bleeding or bruising unusually weak or tired Side effects that usually do not require medical attention (report to your doctor or health care professional if they continue or are bothersome): constipation diarrhea headache joint pain loss of appetite muscle pain runny nose tiredness upset stomach This list may not describe all possible side effects. Call your doctor for medical advice about side effects. You may report side effects to FDA at 1-800-FDA-1088. Where should I keep my medication? This medicine is only given in a clinic, doctor's office, or other health care setting and will not be stored at home. NOTE: This sheet is a summary. It may not cover all possible information. If you have questions about this medicine, talk to your doctor, pharmacist, or health care provider.  2022 Elsevier/Gold Standard (2017-12-24 16:10:44)  

## 2021-05-22 LAB — CANCER ANTIGEN 27.29: CA 27.29: 51.9 U/mL — ABNORMAL HIGH (ref 0.0–38.6)

## 2021-05-27 DIAGNOSIS — D224 Melanocytic nevi of scalp and neck: Secondary | ICD-10-CM | POA: Diagnosis not present

## 2021-05-27 DIAGNOSIS — D485 Neoplasm of uncertain behavior of skin: Secondary | ICD-10-CM | POA: Diagnosis not present

## 2021-05-27 DIAGNOSIS — L814 Other melanin hyperpigmentation: Secondary | ICD-10-CM | POA: Diagnosis not present

## 2021-05-27 DIAGNOSIS — D225 Melanocytic nevi of trunk: Secondary | ICD-10-CM | POA: Diagnosis not present

## 2021-05-27 DIAGNOSIS — D2239 Melanocytic nevi of other parts of face: Secondary | ICD-10-CM | POA: Diagnosis not present

## 2021-05-27 DIAGNOSIS — R238 Other skin changes: Secondary | ICD-10-CM | POA: Diagnosis not present

## 2021-05-30 ENCOUNTER — Other Ambulatory Visit (HOSPITAL_COMMUNITY): Payer: Self-pay

## 2021-06-02 DIAGNOSIS — D239 Other benign neoplasm of skin, unspecified: Secondary | ICD-10-CM | POA: Diagnosis not present

## 2021-06-06 ENCOUNTER — Other Ambulatory Visit (HOSPITAL_COMMUNITY): Payer: Self-pay

## 2021-06-10 ENCOUNTER — Other Ambulatory Visit (HOSPITAL_COMMUNITY): Payer: Self-pay

## 2021-06-11 ENCOUNTER — Other Ambulatory Visit: Payer: Self-pay

## 2021-06-11 ENCOUNTER — Ambulatory Visit (INDEPENDENT_AMBULATORY_CARE_PROVIDER_SITE_OTHER): Payer: BC Managed Care – PPO | Admitting: Surgical

## 2021-06-11 ENCOUNTER — Encounter: Payer: Self-pay | Admitting: Surgical

## 2021-06-11 DIAGNOSIS — Z170 Estrogen receptor positive status [ER+]: Secondary | ICD-10-CM

## 2021-06-11 DIAGNOSIS — Z9012 Acquired absence of left breast and nipple: Secondary | ICD-10-CM

## 2021-06-11 DIAGNOSIS — C50512 Malignant neoplasm of lower-outer quadrant of left female breast: Secondary | ICD-10-CM

## 2021-06-11 DIAGNOSIS — Z9889 Other specified postprocedural states: Secondary | ICD-10-CM

## 2021-06-11 NOTE — Progress Notes (Signed)
60 year old female here for follow-up after undergoing exchange of left breast tissue expander to saline implant and exchange of right saline implant with Dr. Marla Roe on 03/19/2021.  She is approximately 3 months postop.  She reports overall she is doing well.  She does report that she has some pain on the left side with certain movements.  Chaperone present on exam On exam bilateral incisions are intact, bilateral NAC's are viable.  The left breast skin is very firm and there is some scarring along the inferior lateral aspect.  There is some radiation changes.  There is no erythema or cellulitic changes.  No subcutaneous fluid collections noted with palpation.  I do not appreciate any abnormal areas on exam.    We discussed massaging the left breast scarring to help soften up the scar tissue and this may provide some relief.  The left breast is quite firm, this is unchanged from her previous visit and likely related to radiation.  I do not appreciate any firmness of the capsule of either breast.  I do not appreciate any signs of infection.    Recommend following up in 2 to 3 months for reevaluation.  Recommend calling with questions or concerns.  Discussed with patient we could certainly see her sooner if anything changes.  Patient was agreeable to this plan.  All of her questions were answered to her content.

## 2021-06-13 DIAGNOSIS — Z1331 Encounter for screening for depression: Secondary | ICD-10-CM | POA: Diagnosis not present

## 2021-06-13 DIAGNOSIS — I700 Atherosclerosis of aorta: Secondary | ICD-10-CM | POA: Diagnosis not present

## 2021-06-13 DIAGNOSIS — I10 Essential (primary) hypertension: Secondary | ICD-10-CM | POA: Diagnosis not present

## 2021-06-13 DIAGNOSIS — E7800 Pure hypercholesterolemia, unspecified: Secondary | ICD-10-CM | POA: Diagnosis not present

## 2021-06-13 DIAGNOSIS — K760 Fatty (change of) liver, not elsewhere classified: Secondary | ICD-10-CM | POA: Diagnosis not present

## 2021-06-18 ENCOUNTER — Inpatient Hospital Stay: Payer: BC Managed Care – PPO

## 2021-06-18 ENCOUNTER — Inpatient Hospital Stay: Payer: BC Managed Care – PPO | Attending: Oncology

## 2021-06-18 ENCOUNTER — Other Ambulatory Visit: Payer: Self-pay

## 2021-06-18 VITALS — BP 158/90 | HR 85 | Temp 99.0°F | Resp 17

## 2021-06-18 DIAGNOSIS — C7951 Secondary malignant neoplasm of bone: Secondary | ICD-10-CM

## 2021-06-18 DIAGNOSIS — C50812 Malignant neoplasm of overlapping sites of left female breast: Secondary | ICD-10-CM | POA: Diagnosis not present

## 2021-06-18 DIAGNOSIS — K760 Fatty (change of) liver, not elsewhere classified: Secondary | ICD-10-CM

## 2021-06-18 DIAGNOSIS — C50512 Malignant neoplasm of lower-outer quadrant of left female breast: Secondary | ICD-10-CM

## 2021-06-18 DIAGNOSIS — Z79899 Other long term (current) drug therapy: Secondary | ICD-10-CM | POA: Diagnosis not present

## 2021-06-18 DIAGNOSIS — I700 Atherosclerosis of aorta: Secondary | ICD-10-CM

## 2021-06-18 DIAGNOSIS — C50912 Malignant neoplasm of unspecified site of left female breast: Secondary | ICD-10-CM

## 2021-06-18 LAB — COMPREHENSIVE METABOLIC PANEL
ALT: 22 U/L (ref 0–44)
AST: 20 U/L (ref 15–41)
Albumin: 4.1 g/dL (ref 3.5–5.0)
Alkaline Phosphatase: 50 U/L (ref 38–126)
Anion gap: 13 (ref 5–15)
BUN: 12 mg/dL (ref 8–23)
CO2: 27 mmol/L (ref 22–32)
Calcium: 9.7 mg/dL (ref 8.9–10.3)
Chloride: 103 mmol/L (ref 98–111)
Creatinine, Ser: 0.83 mg/dL (ref 0.44–1.00)
GFR, Estimated: >60 mL/min (ref >60)
Glucose, Bld: 114 mg/dL — ABNORMAL HIGH (ref 70–99)
Potassium: 3.6 mmol/L (ref 3.5–5.1)
Sodium: 143 mmol/L (ref 135–145)
Total Bilirubin: 0.4 mg/dL (ref 0.3–1.2)
Total Protein: 7.1 g/dL (ref 6.5–8.1)

## 2021-06-18 LAB — CBC WITH DIFFERENTIAL/PLATELET
Abs Immature Granulocytes: 0.01 10*3/uL (ref 0.00–0.07)
Basophils Absolute: 0 10*3/uL (ref 0.0–0.1)
Basophils Relative: 1 %
Eosinophils Absolute: 0.1 10*3/uL (ref 0.0–0.5)
Eosinophils Relative: 2 %
HCT: 35 % — ABNORMAL LOW (ref 36.0–46.0)
Hemoglobin: 12.7 g/dL (ref 12.0–15.0)
Immature Granulocytes: 0 %
Lymphocytes Relative: 32 %
Lymphs Abs: 0.9 10*3/uL (ref 0.7–4.0)
MCH: 35.9 pg — ABNORMAL HIGH (ref 26.0–34.0)
MCHC: 36.3 g/dL — ABNORMAL HIGH (ref 30.0–36.0)
MCV: 98.9 fL (ref 80.0–100.0)
Monocytes Absolute: 0.4 10*3/uL (ref 0.1–1.0)
Monocytes Relative: 14 %
Neutro Abs: 1.4 10*3/uL — ABNORMAL LOW (ref 1.7–7.7)
Neutrophils Relative %: 51 %
Platelets: 250 10*3/uL (ref 150–400)
RBC: 3.54 MIL/uL — ABNORMAL LOW (ref 3.87–5.11)
RDW: 13.1 % (ref 11.5–15.5)
WBC: 2.8 10*3/uL — ABNORMAL LOW (ref 4.0–10.5)
nRBC: 0 % (ref 0.0–0.2)

## 2021-06-18 MED ORDER — FULVESTRANT 250 MG/5ML IM SOSY
500.0000 mg | PREFILLED_SYRINGE | Freq: Once | INTRAMUSCULAR | Status: AC
  Administered 2021-06-18: 500 mg via INTRAMUSCULAR
  Filled 2021-06-18: qty 10

## 2021-06-18 MED ORDER — DENOSUMAB 120 MG/1.7ML ~~LOC~~ SOLN
120.0000 mg | Freq: Once | SUBCUTANEOUS | Status: AC
  Administered 2021-06-18: 120 mg via SUBCUTANEOUS
  Filled 2021-06-18: qty 1.7

## 2021-06-19 DIAGNOSIS — D485 Neoplasm of uncertain behavior of skin: Secondary | ICD-10-CM | POA: Diagnosis not present

## 2021-06-19 DIAGNOSIS — D235 Other benign neoplasm of skin of trunk: Secondary | ICD-10-CM | POA: Diagnosis not present

## 2021-06-19 DIAGNOSIS — R238 Other skin changes: Secondary | ICD-10-CM | POA: Diagnosis not present

## 2021-06-19 LAB — CANCER ANTIGEN 27.29: CA 27.29: 49.1 U/mL — ABNORMAL HIGH (ref 0.0–38.6)

## 2021-07-02 ENCOUNTER — Other Ambulatory Visit (HOSPITAL_COMMUNITY): Payer: Self-pay

## 2021-07-09 ENCOUNTER — Other Ambulatory Visit (HOSPITAL_COMMUNITY): Payer: Self-pay

## 2021-07-10 ENCOUNTER — Other Ambulatory Visit: Payer: Self-pay

## 2021-07-10 ENCOUNTER — Ambulatory Visit (HOSPITAL_COMMUNITY)
Admission: RE | Admit: 2021-07-10 | Discharge: 2021-07-10 | Disposition: A | Discharge status: Home / Self Care | Payer: BC Managed Care – PPO | Source: Ambulatory Visit | Attending: Oncology | Admitting: Oncology

## 2021-07-10 ENCOUNTER — Encounter: Payer: Self-pay | Admitting: Oncology

## 2021-07-10 DIAGNOSIS — K760 Fatty (change of) liver, not elsewhere classified: Secondary | ICD-10-CM | POA: Insufficient documentation

## 2021-07-10 DIAGNOSIS — Z170 Estrogen receptor positive status [ER+]: Secondary | ICD-10-CM | POA: Diagnosis not present

## 2021-07-10 DIAGNOSIS — C50919 Malignant neoplasm of unspecified site of unspecified female breast: Secondary | ICD-10-CM | POA: Diagnosis not present

## 2021-07-10 DIAGNOSIS — C50512 Malignant neoplasm of lower-outer quadrant of left female breast: Secondary | ICD-10-CM | POA: Diagnosis not present

## 2021-07-10 DIAGNOSIS — C7951 Secondary malignant neoplasm of bone: Secondary | ICD-10-CM | POA: Diagnosis not present

## 2021-07-10 DIAGNOSIS — R918 Other nonspecific abnormal finding of lung field: Secondary | ICD-10-CM | POA: Diagnosis not present

## 2021-07-10 DIAGNOSIS — I700 Atherosclerosis of aorta: Secondary | ICD-10-CM | POA: Diagnosis not present

## 2021-07-10 LAB — GLUCOSE, CAPILLARY: Glucose-Capillary: 129 mg/dL — ABNORMAL HIGH (ref 70–99)

## 2021-07-10 MED ORDER — FLUDEOXYGLUCOSE F - 18 (FDG) INJECTION
8.5000 | Freq: Once | INTRAVENOUS | Status: AC | PRN
  Administered 2021-07-10: 8.4 via INTRAVENOUS

## 2021-07-16 ENCOUNTER — Inpatient Hospital Stay: Payer: BC Managed Care – PPO | Attending: Oncology

## 2021-07-16 ENCOUNTER — Inpatient Hospital Stay: Payer: BC Managed Care – PPO

## 2021-07-16 ENCOUNTER — Other Ambulatory Visit: Payer: Self-pay

## 2021-07-16 VITALS — BP 146/86 | HR 86 | Temp 98.9°F | Resp 16

## 2021-07-16 DIAGNOSIS — Z170 Estrogen receptor positive status [ER+]: Secondary | ICD-10-CM

## 2021-07-16 DIAGNOSIS — C50812 Malignant neoplasm of overlapping sites of left female breast: Secondary | ICD-10-CM | POA: Insufficient documentation

## 2021-07-16 DIAGNOSIS — Z79899 Other long term (current) drug therapy: Secondary | ICD-10-CM | POA: Insufficient documentation

## 2021-07-16 DIAGNOSIS — C7951 Secondary malignant neoplasm of bone: Secondary | ICD-10-CM

## 2021-07-16 DIAGNOSIS — K760 Fatty (change of) liver, not elsewhere classified: Secondary | ICD-10-CM

## 2021-07-16 DIAGNOSIS — C50512 Malignant neoplasm of lower-outer quadrant of left female breast: Secondary | ICD-10-CM

## 2021-07-16 DIAGNOSIS — I700 Atherosclerosis of aorta: Secondary | ICD-10-CM

## 2021-07-16 DIAGNOSIS — C50912 Malignant neoplasm of unspecified site of left female breast: Secondary | ICD-10-CM

## 2021-07-16 LAB — CBC WITH DIFFERENTIAL/PLATELET
Abs Immature Granulocytes: 0.01 10*3/uL (ref 0.00–0.07)
Basophils Absolute: 0 10*3/uL (ref 0.0–0.1)
Basophils Relative: 2 %
Eosinophils Absolute: 0.1 10*3/uL (ref 0.0–0.5)
Eosinophils Relative: 2 %
HCT: 34.1 % — ABNORMAL LOW (ref 36.0–46.0)
Hemoglobin: 12.1 g/dL (ref 12.0–15.0)
Immature Granulocytes: 0 %
Lymphocytes Relative: 39 %
Lymphs Abs: 1 10*3/uL (ref 0.7–4.0)
MCH: 35.7 pg — ABNORMAL HIGH (ref 26.0–34.0)
MCHC: 35.5 g/dL (ref 30.0–36.0)
MCV: 100.6 fL — ABNORMAL HIGH (ref 80.0–100.0)
Monocytes Absolute: 0.5 10*3/uL (ref 0.1–1.0)
Monocytes Relative: 17 %
Neutro Abs: 1.1 10*3/uL — ABNORMAL LOW (ref 1.7–7.7)
Neutrophils Relative %: 40 %
Platelets: 209 10*3/uL (ref 150–400)
RBC: 3.39 MIL/uL — ABNORMAL LOW (ref 3.87–5.11)
RDW: 12.9 % (ref 11.5–15.5)
WBC: 2.6 10*3/uL — ABNORMAL LOW (ref 4.0–10.5)
nRBC: 0 % (ref 0.0–0.2)

## 2021-07-16 LAB — COMPREHENSIVE METABOLIC PANEL
ALT: 26 U/L (ref 0–44)
AST: 21 U/L (ref 15–41)
Albumin: 4 g/dL (ref 3.5–5.0)
Alkaline Phosphatase: 48 U/L (ref 38–126)
Anion gap: 10 (ref 5–15)
BUN: 10 mg/dL (ref 8–23)
CO2: 28 mmol/L (ref 22–32)
Calcium: 9.1 mg/dL (ref 8.9–10.3)
Chloride: 103 mmol/L (ref 98–111)
Creatinine, Ser: 0.84 mg/dL (ref 0.44–1.00)
GFR, Estimated: >60 mL/min (ref >60)
Glucose, Bld: 114 mg/dL — ABNORMAL HIGH (ref 70–99)
Potassium: 3.6 mmol/L (ref 3.5–5.1)
Sodium: 141 mmol/L (ref 135–145)
Total Bilirubin: 0.3 mg/dL (ref 0.3–1.2)
Total Protein: 6.5 g/dL (ref 6.5–8.1)

## 2021-07-16 MED ORDER — FULVESTRANT 250 MG/5ML IM SOSY
500.0000 mg | PREFILLED_SYRINGE | Freq: Once | INTRAMUSCULAR | Status: AC
  Administered 2021-07-16: 500 mg via INTRAMUSCULAR
  Filled 2021-07-16: qty 10

## 2021-07-16 MED ORDER — DENOSUMAB 120 MG/1.7ML ~~LOC~~ SOLN
120.0000 mg | Freq: Once | SUBCUTANEOUS | Status: AC
  Administered 2021-07-16: 120 mg via SUBCUTANEOUS
  Filled 2021-07-16: qty 1.7

## 2021-07-17 LAB — CANCER ANTIGEN 27.29: CA 27.29: 54.6 U/mL — ABNORMAL HIGH (ref 0.0–38.6)

## 2021-07-31 ENCOUNTER — Telehealth: Payer: Self-pay | Admitting: Pharmacy Technician

## 2021-07-31 ENCOUNTER — Other Ambulatory Visit (HOSPITAL_COMMUNITY): Payer: Self-pay

## 2021-07-31 NOTE — Telephone Encounter (Signed)
Submitted a Prior Authorization request to [employer] for  Ibrance  via CoverMyMeds. Will update once we receive a response.   Key: [facility]

## 2021-07-31 NOTE — Telephone Encounter (Signed)
Received notification from [facility] regarding a prior authorization for  Ibrance . Authorization has been APPROVED from 07/31/21 to 07/30/22.    Authorization #  [facility]

## 2021-08-01 ENCOUNTER — Other Ambulatory Visit (HOSPITAL_COMMUNITY): Payer: Self-pay

## 2021-08-04 ENCOUNTER — Other Ambulatory Visit (HOSPITAL_COMMUNITY): Payer: Self-pay

## 2021-08-08 ENCOUNTER — Other Ambulatory Visit (HOSPITAL_COMMUNITY): Payer: Self-pay

## 2021-08-13 NOTE — Progress Notes (Signed)
[place]  [phone] [phone]    ID: Natasha Graham DOB: 1961/01/13  MR#: [id number]  ZLD#:[id number]  Patient Care Team: Leonides Sake, MD as PCP - General (Family Medicine) Rolm Bookbinder, MD as Consulting Physician (General Surgery) Eliseo Withers, Virgie Dad, MD as Consulting Physician (Oncology) Mauro Kaufmann, RN as Registered Nurse Rockwell Germany, RN as Registered Nurse Dillingham, Loel Lofty, DO as Attending Physician (Plastic Surgery) Gery Pray, MD as Consulting Physician (Radiation Oncology) Brien Few, MD as Consulting Physician (Obstetrics and Gynecology) Raina Mina, RPH-CPP (Pharmacist) OTHER MD: Sherrie Mustache MD   CHIEF COMPLAINT: new estrogen receptor positive stage IV breast cancer; remote noninvasive right breast cancer (s/p bilateral mastectomies)  CURRENT TREATMENT: Denosumab/Xgeva; fulvestrant; palbociclib/Ibrance   INTERVAL HISTORY: Natasha Graham returns today for follow up and treatment of her newly diagnosed invasive left breast cancer.  Since her last visit, she underwent restaging PET scan on 07/10/2021 showing: no evidence of FDG-avid disease; diffuse sclerotic osseous metastatic disease with no focal hypermetabolic activity; mildly-hypermetabolic asymmetric soft tissue seen about left breast implant, likely posttreatment changes.  She continues on fulvestrant every 28 days.  She has had no problems from this including no significant discomfort from the actual injections.    She also receives denosumab/Xgeva, most recently 07/16/2021.  She she had minimal bony aches initially but those have not recurred and she has not had any symptoms suggestive of hypocalcemia.  She follows with her dentist Dr. Tyson Alias in [place], last 03/2021 with no concerns.  She started on Ibrance on 09/12/2020.  We have had to drop her dose rcurrently to 75 mg .  This continues 3 weeks on and 1 week off.  She has no side effects  from the [place] that she is aware of and does not notice any difference on the week off then on the weeks on.  We are following her CA 27-29 Lab Results  Component Value Date   CA2729 54.6 (H) 07/16/2021   CA2729 49.1 (H) 06/18/2021   CA2729 51.9 (H) 05/21/2021   CA2729 55.5 (H) 04/24/2021   CA2729 48.1 (H) 03/26/2021    REVIEW OF SYSTEMS: Natasha Graham takes care of of her 61-year-old and 33-year-old granddaughters most days and then also picks up her 14-year-old grandchild from school and takes care of them in the early evening.  This keeps her busy.  She does have a treadmill at home but she is not using it regularly.  She does not belong to a gym.  She does not count her steps.  A detailed review of systems today was otherwise stable.   COVID 19 VACCINATION STATUS: Patient had antibodies to the virus documented November 2020.  She refuses vaccinations   LEFT BREAST CANCER HISTORY: From the original intake note:  She had routine screening left mammogram on 06/14/2019 showing a developing asymmetry. She presented for left diagnostic mammogram and left breast ultrasound on 07/07/2019. Physical exam that day showed a palpable, firm, superficial mass in the left breast at 6 o'clock along the site of reduction mammoplasty scar (performed in 2015 with right mastectomy). Scans showed: breast density category B; 2.1 cm superficial mass involving the skin in the left breast at 6 o'clock; no enlarged adenopathy in the left axilla.   Accordingly on 07/12/2019 she proceeded to biopsy of the left breast area in question. The pathology from this procedure ([id number]) showed: invasive lobular carcinoma, grade 1, with perineural invasion present. Prognostic indicators significant for: estrogen receptor, 60%  with moderate staining intensity and progesterone receptor, 0% negative. Proliferation marker Ki67 at <1%. HER2 equvocal by immunohistochemistry (2+), but negative by fluorescent in situ hybridization  with a signals ratio 1.18 and number per cell 1.95. ° °She opted to proceed with left mastectomy on 09/27/2019 under Dr. Wakefield with immediate reconstruction under Dr. Dillingham. 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 ° °Two lymph nodes were biopsied. One showed metastatic lobular carcinoma with focus measuring 2.1 cm with evidence of extranodal extension. The other lymph node also showed metastatic lobular carcinoma with no lymphoid tissue present. ° °Biopsy of the nipple was also taken at that time and showed no evidence of invasive carcinoma. ° °Mammaprint was performed on the final surgical sample. This returned showing low risk. ° °RIGHT BREAST CANCER HISTORY: °Natasha Graham has a history of right breast biopsy in late 2007 showing atypical ductal hyperplasia in the upper outer right breast.. Breast MRI 07/13/2006 showed postbiopsy changes in the upper right breast, and some right breast cysts. There were no solid masses however or areas of abnormal enhancement. There were no enlarged lymph nodes. Incidental finding of liver cysts was made. On 07/30/2006 she underwent needle localization of the calcifications in the outer right breast which also showed ([id number]) atypical ductal hyperplasia, with no malignancy.  ° °On 10/21/2012 routine digital bilateral screening mammography at the breast Center showed breast density to be category C. In the right breast there were calcifications and architectural distortion to go with skin retraction. Additional views obtained 11/17/2012 showed extensive but stable punctate microcalcifications in the upper outer right breast. Many of these were superficial and not amenable to stereotactic biopsy. MRI was suggested but the  patient was unable to undergo that test and therefore six-month mammography was obtained 05/15/2013. This showed the calcifications in the outer right breast to have been stable. Because of their extents and because of their superficial location they could not be all sampled all removed with biopsy. MRI was again discussed, but the patient has Harrington rods in place and also felt the cost of MRI was prohibitive. ° °On 11/13/2013 further six-month follow-up showed a grouping of intermediate microcalcifications in the lateral right breast and these were biopsied 11/20/2013. The result of that procedure ([id number]) showed atypical ductal hyperplasia. Accordingly on 02/23/2014 the patient underwent right lumpectomy, with the pathology ([id number]) showing ductal carcinoma in situ, grade 2, measuring 0.7 cm. Estrogen receptor was 100% positive. Progesterone receptor was 96% positive. Both showed strong staining intensity. Margins were clear but the closest margin anteriorly was at 1 mm. ° °On 03/07/2014 the patient underwent postoperative mammography which showed post lumpectomy changes but also calcifications extending from the lumpectomy site anteriorly towards the nipple. On 03/12/2014 the patient underwent bilateral breast MRI. Breast composition here was described as category B. In the right breast there was a 4.8 cm fluidlike excisional cavity in the lower outer right breast. Adjacent to this was a focus of non-masslike enhancement measuring 1.9 cm. This is an area associated with the scar from the excision in 2007. There was also non-masslike enhancement inferior to the biopsy cavity measuring 3.8 cm. There were no abnormal lymph nodes and no masses or abnormal enhancement in the left breast. Specifically the left breast lower outer quadrant was unchanged from 2007 MRI. ° °  MRI.  Overall it was felt the linear non-masslike enhancement extending almost 4 cm inferior to the excisional cavity was sufficiently  suspicious to warrant biopsy, and this was performed 03/29/2014. It again showed ductal carcinoma in situ, grade 1, estrogen receptor and progesterone receptor both 100% positive with strong staining intensity.  At this point the patient met with surgery and opted for definitive right mastectomy with immediate reconstruction. On 05/16/2014 she underwent simple mastectomy with sentinel lymph node sampling. The pathology ([id number]) showed atypical ductal hyperplasia but no evidence of neoplasia. All 5 sentinel lymph nodes were clear. The patient had immediate right breast reconstruction with tissue expander and dermal matrix placement.  She is scheduled for definitive implant placement 08/08/2014.  Her subsequent history is as detailed below   PAST MEDICAL HISTORY: Past Medical History:  Diagnosis Date   Cancer ([place])    breast cancer left 2021 roght 2015   Family history of brain cancer    Family history of breast cancer    Family history of colon cancer    Family history of stomach cancer    History of radiation therapy last done February 06 2020   Hypertension    PMB (postmenopausal bleeding)    PONV (postoperative nausea and vomiting)    likes scopolamine patch   Scoliosis    Wears glasses    Wears glasses     PAST SURGICAL HISTORY: Past Surgical History:  Procedure Laterality Date   AUGMENTATION MAMMAPLASTY Right    2015 post mastectomy   AXILLARY LYMPH NODE DISSECTION Left 11/13/2019   Procedure: LEFT AXILLARY LYMPH NODE DISSECTION;  Surgeon: Rolm Bookbinder, MD;  Location: [facility];  Service: General;  Laterality: Left;   BREAST IMPLANT EXCHANGE Right 03/19/2021   Procedure: exchange of right saline implant;  Surgeon: Wallace Going, DO;  Location: [facility];  Service: Plastics;  Laterality: Right;   BREAST RECONSTRUCTION WITH PLACEMENT OF TISSUE EXPANDER AND FLEX HD (ACELLULAR HYDRATED DERMIS) Right 05/16/2014   Procedure: IMMEDIATE RIGHT  BREAST RECONSTRUCTION WITH PLACEMENT OF TISSUE EXPANDER AND FLEX HD (ACELLULAR HYDRATED DERMIS);  Surgeon: Theodoro Kos, DO;  Location: [facility];  Service: Plastics;  Laterality: Right;   BREAST RECONSTRUCTION WITH PLACEMENT OF TISSUE EXPANDER AND FLEX HD (ACELLULAR HYDRATED DERMIS) Left 09/27/2019   Procedure: LEFT BREAST RECONSTRUCTION WITH PLACEMENT OF TISSUE EXPANDER AND FLEX HD (ACELLULAR HYDRATED DERMIS);  Surgeon: Wallace Going, DO;  Location: [facility];  Service: Plastics;  Laterality: Left;   BREAST SURGERY     right breast excisional biopsy   DILATATION & CURETTAGE/HYSTEROSCOPY WITH MYOSURE N/A 12/20/2020   Procedure: [place];  Surgeon: Brien Few, MD;  Location: [facility];  Service: Gynecology;  Laterality: N/A;   DILATION AND CURETTAGE OF UTERUS     HYSTEROSCOPY WITH D & C N/A 09/20/2015   Procedure: DILATATION AND CURETTAGE /HYSTEROSCOPY;  Surgeon: Brien Few, MD;  Location: [facility];  Service: Gynecology;  Laterality: N/A;   LIPOSUCTION Bilateral 08/08/2014   Procedure: LIPO SUCTION ;  Surgeon: Theodoro Kos, DO;  Location: [facility];  Service: Plastics;  Laterality: Bilateral;   MASTECTOMY Right 05/16/2014   placement of acellular dermal matrix & tissue expanders    MASTOPEXY Left 08/08/2014   Procedure:  MASTOPEXY FOR SYMMETRY;  Surgeon: Theodoro Kos, DO;  Location: [facility];  Service: Plastics;  Laterality: Left;   NIPPLE SPARING MASTECTOMY WITH SENTINEL LYMPH NODE BIOPSY Left  Procedure: LEFT NIPPLE SPARING MASTECTOMY WITH LEFT AXILLARY SENTINEL LYMPH NODE BIOPSY;  Surgeon: Wakefield, Matthew, MD;  Location: [facility];  Service: General;  Laterality: Left;  ° REDUCTION MAMMAPLASTY Left   ° 2015  ° REMOVAL OF TISSUE EXPANDER AND PLACEMENT OF IMPLANT Right 08/08/2014  ° Procedure: REMOVAL OF RIGHT  TISSUE EXPANDERS WITH PLACEMENT OF RIGHT  BREAST IMPLANTS WITH LIPO SUCTION ;  Surgeon: Claire Sanger, DO;  Location: [facility];  Service: Plastics;  Laterality: Right;  ° REMOVAL OF TISSUE EXPANDER AND PLACEMENT OF IMPLANT Left 03/19/2021  ° Procedure: Removal of left expander for saline implant;  Surgeon: Dillingham, Claire S, DO;  Location: [facility];  Service: Plastics;  Laterality: Left;  2 hours  ° ROBOTIC ASSISTED TOTAL HYSTERECTOMY WITH BILATERAL SALPINGO OOPHERECTOMY Bilateral 02/06/2021  ° Procedure: XI ROBOTIC ASSISTED TOTAL HYSTERECTOMY WITH BILATERAL SALPINGO OOPHORECTOMY, LYSIS of SIGMOID ADHESIONS MCCALL CUL DE PLASTY;  Surgeon: Taavon, Richard, MD;  Location: [facility];  Service: Gynecology;  Laterality: Bilateral;  ° SCAR REVISION Left 11/13/2019  ° Procedure: EXCISION OF LEFT MASTECTOMY SKIN;  Surgeon: Wakefield, Matthew, MD;  Location: [facility];  Service: General;  Laterality: Left;  ° scoliosis  1972  ° harrington rods-age 10  ° TONSILLECTOMY  as child  ° ° °FAMILY HISTORY °Family History  °Problem Relation Age of Onset  ° Heart disease Mother   ° Cancer Father   °     liver  ° Colon cancer Father 60  ° Head & neck cancer Father   °     oral cancer - dx in 40s-50s  ° Breast cancer Paternal Aunt   °     dx <50  ° Brain cancer Brother   °     possible meningioma  ° Cancer Maternal Aunt   °     liver/lung cancer  ° Stomach cancer Paternal Uncle   ° Heart disease Maternal Grandmother   ° Lymphoma Maternal Grandfather   °     NHL  ° Heart attack Paternal Grandfather   ° Cancer Paternal Uncle   °     NOS  ° there is significant cancer history on the paternal side, her father dying at the age of 58 with what the patient tells me was metastatic head and neck cancer (but also involving the colon and liver). One of the patient's father's brothers had stomach cancer, and another had a brain tumor, and 2 of the patient's father's sisters had breast cancer, 1 diagnosed at age 54, the other  at age 65.  On the mother's side there is a history of non-Hodgkin's lymphoma at age 87. There is no history of ovarian cancer in the family ° ° °GYNECOLOGIC HISTORY:  °Patient's last menstrual period was 10/16/2013. ° menarche age 10, first live birth age 27. The patient is GX P3. She stopped having periods in October 2014. She did not take hormone replacement. She did take birth control pills for approximately 1 year remotely, with no complications. 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 She works as an administrative assistant. Son Natasha Graham drives a truck for the family business. Daughter Natasha Graham is a college graduate and currently works as an extension agent in [place].  Daughter Natasha Graham graduated in history and English from [employer] December 2022. The patient attends [employer] °  ° ADVANCED DIRECTIVES:   In the absence of any documents to the contrary the patient's husband is her healthcare power of attorney ° ° °HEALTH MAINTENANCE: °Social History  ° °Tobacco Use  ° Smoking status: Never  ° Smokeless tobacco: Never  °Vaping Use  ° Vaping Use: Never used  °Substance Use Topics  ° Alcohol use: No  ° Drug use: No  ° ° ° Colonoscopy:  2013 ° PAP: March 2015 ° Bone density: ° Lipid panel: ° °No Known Allergies ° °Current Outpatient Medications  °Medication Sig Dispense Refill  ° acetaminophen (TYLENOL) 500 MG tablet Take 500 mg by mouth.    ° Ascorbic Acid (VITAMIN C) 100 MG tablet Take 100 mg by mouth daily. 1000 mg    ° denosumab (XGEVA) 120 MG/1.7ML SOLN injection Inject 120 mg into the skin once. Monthly    ° Flaxseed Oil (LINSEED OIL) OIL by Misc.(Non-Drug; Combo Route) route.    ° Fulvestrant (FASLODEX IM) Inject into the muscle. Shot every 4 weeks.    ° Ginger, Zingiber officinalis, (GINGER PO) Take by mouth.    ° ibuprofen (ADVIL) 200 MG tablet Take by mouth. Takes 2 of 200 mg    °  lisinopril-hydrochlorothiazide (ZESTORETIC) 20-25 MG tablet Take 1 tablet by mouth daily at 2 PM. Pt states lisinopril increased to 30 mg on 12-12-2020    ° Magnesium 250 MG TABS Take by mouth.    ° Multiple Vitamin (MULTIVITAMIN) capsule Take by mouth.    ° OVER THE COUNTER MEDICATION Calcium 1200mg-Take daily.    ° OVER THE COUNTER MEDICATION Trubiotics-Take daily.    ° palbociclib (IBRANCE) 75 MG tablet Take 1 tablet (75 mg total) by mouth daily. Take for 21 days on, 7 days off, repeat every 28 days. 21 tablet 6  ° Turmeric 500 MG CAPS Take by mouth.    ° °No current facility-administered medications for this visit.  ° ° °OBJECTIVE: White woman who appears well °Vitals:  ° 08/14/21 0839  °BP: (!) 146/80  °Pulse: 92  °Resp: 18  °Temp: 98.1 °F (36.7 °C)  °SpO2: 99%  ° °   Body mass index is 28.96 kg/m².    ECOG FS:1 - Symptomatic but completely ambulatory ° °Sclerae unicteric, EOMs intact °Wearing a mask °No cervical or supraclavicular adenopathy °Lungs no rales or rhonchi °Heart regular rate and rhythm °Abd soft, nontender, positive bowel sounds °MSK no focal spinal tenderness, no upper extremity lymphedema °Neuro: nonfocal, well oriented, appropriate affect °Breasts: Status post bilateral mastectomies with bilateral reconstruction.  The left side is a bit tight but there is no evidence of chest wall recurrence.  Both axillae are benign. ° ° °LAB RESULTS: ° °CMP  °   °Component Value Date/Time  ° NA 141 07/16/2021 0806  ° NA 143 07/30/2014 1603  ° K 3.6 07/16/2021 0806  ° K 3.5 07/30/2014 1603  ° CL 103 07/16/2021 0806  ° CO2 28 07/16/2021 0806  ° CO2 30 (H) 07/30/2014 1603  ° GLUCOSE 114 (H) 07/16/2021 0806  ° GLUCOSE 93 07/30/2014 1603  ° BUN 10 07/16/2021 0806  ° BUN 10.5 07/30/2014 1603  ° CREATININE 0.84 07/16/2021 0806  ° CREATININE 0.9 07/30/2014 1603  ° CALCIUM 9.1 07/16/2021 0806  ° CALCIUM 9.5 07/30/2014 1603  ° PROT 6.5 07/16/2021 0806  ° PROT 7.0 07/30/2014 1603  ° ALBUMIN 4.0 07/16/2021 0806  ° ALBUMIN  4.0 07/30/2014 1603  ° AST 21 07/16/2021 0806  ° AST 23 07/30/2014 1603  ° ALT 26 07/16/2021 0806  °   ALT 23 07/30/2014 1603   ALKPHOS 48 07/16/2021 0806   ALKPHOS 78 07/30/2014 1603   BILITOT 0.3 07/16/2021 0806   BILITOT 0.29 07/30/2014 1603   GFRNONAA >60 07/16/2021 0806   GFRAA >60 04/29/2020 0829    INo results found for: SPEP, UPEP  Lab Results  Component Value Date   WBC 2.8 (L) 08/14/2021   NEUTROABS 1.3 (L) 08/14/2021   HGB 12.3 08/14/2021   HCT 34.1 (L) 08/14/2021   MCV 99.7 08/14/2021   PLT 237 08/14/2021      Chemistry      Component Value Date/Time   NA 141 07/16/2021 0806   NA 143 07/30/2014 1603   K 3.6 07/16/2021 0806   K 3.5 07/30/2014 1603   CL 103 07/16/2021 0806   CO2 28 07/16/2021 0806   CO2 30 (H) 07/30/2014 1603   BUN 10 07/16/2021 0806   BUN 10.5 07/30/2014 1603   CREATININE 0.84 07/16/2021 0806   CREATININE 0.9 07/30/2014 1603      Component Value Date/Time   CALCIUM 9.1 07/16/2021 0806   CALCIUM 9.5 07/30/2014 1603   ALKPHOS 48 07/16/2021 0806   ALKPHOS 78 07/30/2014 1603   AST 21 07/16/2021 0806   AST 23 07/30/2014 1603   ALT 26 07/16/2021 0806   ALT 23 07/30/2014 1603   BILITOT 0.3 07/16/2021 0806   BILITOT 0.29 07/30/2014 1603      No results found for: LABCA2  No components found for: LABCA125  No results for input(s): INR in the last 168 hours.  Urinalysis No results found for: COLORURINE, APPEARANCEUR, LABSPEC, PHURINE, GLUCOSEU, HGBUR, BILIRUBINUR, KETONESUR, PROTEINUR, UROBILINOGEN, NITRITE, LEUKOCYTESUR   STUDIES: No results found.   ASSESSMENT: 60 y.o. [place], [place] woman  RIGHT BREAST CANCER (1) status post right breast upper-outer quadrant lumpectomy 07/30/2006 showing atypical ductal hyperplasia.  (2) right breast upper outer quadrant biopsy 11/20/2013 showed atypical ductal hyperplasia   (3) right lumpectomy 02/26/2014 showed ductal carcinoma in situ measuring 0.7 cm, estrogen receptor 100% positive,  progesterone receptor 96% positive, with 1 mm margins  (4) right breast lower inner quadrant biopsy 03/29/2014 showed ductal carcinoma in situ, 100% estrogen receptor positive, 100% progesterone receptor positive,  (5) status post right mastectomy with sentinel lymph node sampling showing only atypical ductal hyperplasia    (a) five sentinel lymph nodes removed, all clear  (b) status post right saline implant reconstruction  (6) the patient opted against prophylactic antiestrogens   LEFT BREAST CANCER (7) status post left breast lower outer quadrant biopsy 07/12/2019 for a clinical T1N0, stage IA invasive lobular carcinoma, grade 1, with evidence of perineural invasion, estrogen receptor moderately positive, progesterone receptor negative, HER-2 not amplified, with an MIB-1 of less than 1%  (8) status post left nipple sparing mastectomy 09/27/2019 for a pT3 pN1, stage IIIA invasive lobular carcinoma, grade 2, with a negative nipple biopsy and a focally positive anterior margin  (a) 1 sentinel lymph node had a 2.1 cm tumor deposit; a second positive "lymph node" had no lymph node tissue  (b) immediate expander placement with definitive implant placement 03/19/2021  (c) status post left axillary lymph node dissection and margin excision 11/13/2019, no residual disease   (i) 7 of 7 left axillary lymph nodes negative for carcinoma (total 8 nodes removed)  (d) repeat prognostic panel again estrogen receptor 80% positive with moderate staining, progesterone receptor negative    (9) MammaPrint "low risk" predicts no significant benefit from chemotherapy.   (10) adjuvant radiation 12/26/2019 -  02/06/2020 °Site Technique Total Dose (Gy) Dose per Fx (Gy) Completed Fx Beam Energies  °Chest Wall, Left: CW_Lt_Bst Electron 10/10 2 5/5 6E  °Sclav-LT: SCV_Lt Complex 50/50 2 25/25 6X, 15X  °Sclav-LT: SCV_Lt_Bst Complex 8/8 2 4/4 6X, 15X  °Chest Wall, Left: CW_Lt 3D 50/50 2 25/25 6X, 10X, 15X  ° °(11) staging  studies:  ° (a) chest, abdomen and pelvic CT with contrast 10/31/2019 shows mild hepatic steatosis, small uterine fibroids, 0.3 cm left upper lobe nodule, but no evidence of metastatic disease. ° (b) F18 estradiol PET scan 11/21/2019 shows a chain of very small lymph nodes from the subpectoralis nodal station to the left supraclavicular nodal station positive for ER accumulation with SUVs in the 4 range (versus mediastinal blood pool 1.4). no evidence of lung, bone, or liver involvement.  ° °(12) tamoxifen started 02/29/2020, discontinued DEC 2021 with recurrence ° °(13) genetics testing 06/27/2020 through the CancerNext-Expanded gene panel offered by [facility] found no deleterious mutations in AIP, ALK, APC*, ATM*, AXIN2, BAP1, BARD1, BLM, BMPR1A, BRCA1*, BRCA2*, BRIP1*, CDC73, CDH1*, CDK4, CDKN1B, CDKN2A, CHEK2*, CTNNA1, DICER1, FANCC, FH, FLCN, GALNT12, KIF1B, LZTR1, MAX, MEN1, MET, MLH1*, MSH2*, MSH3, MSH6*, MUTYH*, NBN, NF1*, NF2, NTHL1, PALB2*, PHOX2B, PMS2*, POT1, PRKAR1A, PTCH1, PTEN*, RAD51C*, RAD51D*, RB1, RECQL, RET, SDHA, SDHAF2, SDHB, SDHC, SDHD, SMAD4, SMARCA4, SMARCB1, SMARCE1, STK11, SUFU, TMEM127, TP53*, TSC1, TSC2, VHL and XRCC2 (sequencing and deletion/duplication); EGFR, EGLN1, HOXB13, KIT, MITF, PDGFRA, POLD1, and POLE (sequencing only); EPCAM and GREM1 (deletion/duplication only). DNA and RNA analyses performed for * genes. The report date is 06/27/2020. ° °(14) underwent bilateral implant placement with capsulotomies 03/19/2021 with benign pathology °Left - [employer] Smooth Round High Profile Saline 290 - 350 cc. Ref #[id number].  Serial Number [id number], 350 cc °Right - [employer] Smooth Round High Profile Saline 380 - 450 cc. Ref #[id number].  Serial Number [id number] °450 cc ° °METASTATIC DISEASE: December 2021 °(15) chest and neck CT with contrast 08/02/2020 shows diffuse bony sclerosis consistent with metastatic disease, no evidence of visceral disease ° (a) CA 27-29 on 08/05/2020  informative at 98.5 ° (b) bone marrow biopsy 08/06/2020 showed metastatic carcinoma, estrogen receptor positive (75% moderate/strong), progesterone receptor and HER-2 negative. ° °(16) denosumab/Xgeva started 08/15/2020 ° °(17) fulvestrant started 08/15/2020 ° (a) palbociclib started 09/12/2020 at 125 mg per day, 21/7 ° (b) palbociclib dose to be decreased to 100 mg daily starting with March 2022 cycle ° (c) dose dropped to 75 mg daily, 21/7, beginning with May 2022 cycle ° °(18) bilateral salpingo-oophorectomy 02/06/2021 ° °(19) staging studies: ° (A) CT of the neck 08/02/2020 shows decreased size of small left supraclavicular lymph nodes ° (B) CT of the chest 08/02/2020 shows no visceral or measurable disease ° (C) bone scan 10/02/2020 serves as a baseline for future studies  ° (D) PET scan 07/10/2021 shows sclerotic bone lesions without focal hypermetabolic activity, no evidence of visceral disease. M  ° ° °PLAN: °Natasha Graham is now about a year out from definitive diagnosis of metastatic breast cancer.  She has no symptoms directly related to her disease, and her disease appears to be well controlled per the recent restaging PET scan. ° °She is also tolerating her treatment without any side effects that she is aware of.  Accordingly I am making no changes overall. ° °We discussed the fact that lobular breast cancers are very difficult to image.  I have several lobular breast cancer patients with well-documented estrogen positive [place] metastases and they do not show up on regular PET   scans or [place] scans.  In some cases the tumor marker is helpful.  In Natasha Graham's case so far it really has not either normalized or shown any trend.  She will return in 4 weeks for treatment and in 8 weeks for treatment and a visit with her new medical oncologist, Dr. Morey Hummingbird  She knows to call for any other issues that may develop before the next visit  Total encounter time 25 minutes.Sarajane Jews C. Anamika Kueker, MD 08/14/2021 8:56 AM   Oncology and Hematology [facility] [place], [place] [address] Tel. [phone]  Joylene Igo [phone]   I, Wilburn Mylar, am acting as scribe for Dr. Sarajane Jews C. Hellon Vaccarella.  I, Lurline Del MD, have reviewed the above documentation for accuracy and completeness, and I agree with the above.   *Total Encounter Time as defined by the Centers for Medicare and Medicaid Services includes, in addition to the face-to-face time of a patient visit (documented in the note above) non-face-to-face time: obtaining and reviewing outside history, ordering and reviewing medications, tests or procedures, care coordination (communications with other health care professionals or caregivers) and documentation in the medical record.

## 2021-08-14 ENCOUNTER — Inpatient Hospital Stay: Payer: BC Managed Care – PPO

## 2021-08-14 ENCOUNTER — Inpatient Hospital Stay: Payer: BC Managed Care – PPO | Attending: Oncology

## 2021-08-14 ENCOUNTER — Inpatient Hospital Stay (HOSPITAL_BASED_OUTPATIENT_CLINIC_OR_DEPARTMENT_OTHER): Payer: BC Managed Care – PPO | Admitting: Oncology

## 2021-08-14 ENCOUNTER — Other Ambulatory Visit: Payer: Self-pay

## 2021-08-14 VITALS — BP 146/80 | HR 92 | Temp 98.1°F | Resp 18 | Ht 64.0 in | Wt 168.7 lb

## 2021-08-14 DIAGNOSIS — Z923 Personal history of irradiation: Secondary | ICD-10-CM | POA: Diagnosis not present

## 2021-08-14 DIAGNOSIS — C50512 Malignant neoplasm of lower-outer quadrant of left female breast: Secondary | ICD-10-CM

## 2021-08-14 DIAGNOSIS — Z9013 Acquired absence of bilateral breasts and nipples: Secondary | ICD-10-CM | POA: Diagnosis not present

## 2021-08-14 DIAGNOSIS — C50812 Malignant neoplasm of overlapping sites of left female breast: Secondary | ICD-10-CM | POA: Insufficient documentation

## 2021-08-14 DIAGNOSIS — I700 Atherosclerosis of aorta: Secondary | ICD-10-CM

## 2021-08-14 DIAGNOSIS — Z170 Estrogen receptor positive status [ER+]: Secondary | ICD-10-CM

## 2021-08-14 DIAGNOSIS — K760 Fatty (change of) liver, not elsewhere classified: Secondary | ICD-10-CM | POA: Diagnosis not present

## 2021-08-14 DIAGNOSIS — Z853 Personal history of malignant neoplasm of breast: Secondary | ICD-10-CM | POA: Insufficient documentation

## 2021-08-14 DIAGNOSIS — Z79899 Other long term (current) drug therapy: Secondary | ICD-10-CM | POA: Diagnosis not present

## 2021-08-14 DIAGNOSIS — C7951 Secondary malignant neoplasm of bone: Secondary | ICD-10-CM

## 2021-08-14 DIAGNOSIS — C50912 Malignant neoplasm of unspecified site of left female breast: Secondary | ICD-10-CM

## 2021-08-14 DIAGNOSIS — I10 Essential (primary) hypertension: Secondary | ICD-10-CM | POA: Insufficient documentation

## 2021-08-14 LAB — CBC WITH DIFFERENTIAL/PLATELET
Abs Immature Granulocytes: 0.01 10*3/uL (ref 0.00–0.07)
Basophils Absolute: 0 10*3/uL (ref 0.0–0.1)
Basophils Relative: 1 %
Eosinophils Absolute: 0 10*3/uL (ref 0.0–0.5)
Eosinophils Relative: 1 %
HCT: 34.1 % — ABNORMAL LOW (ref 36.0–46.0)
Hemoglobin: 12.3 g/dL (ref 12.0–15.0)
Immature Granulocytes: 0 %
Lymphocytes Relative: 37 %
Lymphs Abs: 1 10*3/uL (ref 0.7–4.0)
MCH: 36 pg — ABNORMAL HIGH (ref 26.0–34.0)
MCHC: 36.1 g/dL — ABNORMAL HIGH (ref 30.0–36.0)
MCV: 99.7 fL (ref 80.0–100.0)
Monocytes Absolute: 0.4 10*3/uL (ref 0.1–1.0)
Monocytes Relative: 16 %
Neutro Abs: 1.3 10*3/uL — ABNORMAL LOW (ref 1.7–7.7)
Neutrophils Relative %: 45 %
Platelets: 237 10*3/uL (ref 150–400)
RBC: 3.42 MIL/uL — ABNORMAL LOW (ref 3.87–5.11)
RDW: 12.4 % (ref 11.5–15.5)
WBC: 2.8 10*3/uL — ABNORMAL LOW (ref 4.0–10.5)
nRBC: 0 % (ref 0.0–0.2)

## 2021-08-14 LAB — COMPREHENSIVE METABOLIC PANEL
ALT: 24 U/L (ref 0–44)
AST: 19 U/L (ref 15–41)
Albumin: 4.1 g/dL (ref 3.5–5.0)
Alkaline Phosphatase: 46 U/L (ref 38–126)
Anion gap: 11 (ref 5–15)
BUN: 12 mg/dL (ref 8–23)
CO2: 26 mmol/L (ref 22–32)
Calcium: 9.1 mg/dL (ref 8.9–10.3)
Chloride: 105 mmol/L (ref 98–111)
Creatinine, Ser: 0.96 mg/dL (ref 0.44–1.00)
GFR, Estimated: >60 mL/min (ref >60)
Glucose, Bld: 93 mg/dL (ref 70–99)
Potassium: 3.7 mmol/L (ref 3.5–5.1)
Sodium: 142 mmol/L (ref 135–145)
Total Bilirubin: 0.5 mg/dL (ref 0.3–1.2)
Total Protein: 6.7 g/dL (ref 6.5–8.1)

## 2021-08-14 MED ORDER — DENOSUMAB 120 MG/1.7ML ~~LOC~~ SOLN
120.0000 mg | Freq: Once | SUBCUTANEOUS | Status: AC
  Administered 2021-08-14: 120 mg via SUBCUTANEOUS
  Filled 2021-08-14: qty 1.7

## 2021-08-14 MED ORDER — FULVESTRANT 250 MG/5ML IM SOSY
500.0000 mg | PREFILLED_SYRINGE | Freq: Once | INTRAMUSCULAR | Status: AC
  Administered 2021-08-14: 500 mg via INTRAMUSCULAR
  Filled 2021-08-14: qty 10

## 2021-08-15 ENCOUNTER — Telehealth: Payer: Self-pay | Admitting: Oncology

## 2021-08-15 LAB — CANCER ANTIGEN 27.29: CA 27.29: 49.6 U/mL — ABNORMAL HIGH (ref 0.0–38.6)

## 2021-08-15 NOTE — Telephone Encounter (Signed)
Scheduled appointment per 12/15 los. Patient is aware. 

## 2021-09-03 ENCOUNTER — Other Ambulatory Visit (HOSPITAL_COMMUNITY): Payer: Self-pay

## 2021-09-09 ENCOUNTER — Encounter: Payer: Self-pay | Admitting: Oncology

## 2021-09-09 ENCOUNTER — Ambulatory Visit (INDEPENDENT_AMBULATORY_CARE_PROVIDER_SITE_OTHER): Payer: BC Managed Care – PPO | Admitting: Plastic Surgery

## 2021-09-09 ENCOUNTER — Telehealth: Payer: Self-pay

## 2021-09-09 ENCOUNTER — Other Ambulatory Visit: Payer: Self-pay

## 2021-09-09 ENCOUNTER — Other Ambulatory Visit (HOSPITAL_COMMUNITY): Payer: Self-pay

## 2021-09-09 ENCOUNTER — Encounter: Payer: Self-pay | Admitting: Plastic Surgery

## 2021-09-09 DIAGNOSIS — Z9889 Other specified postprocedural states: Secondary | ICD-10-CM

## 2021-09-09 NOTE — Telephone Encounter (Signed)
Oral Oncology Patient Advocate Encounter   Received notification from [facility] that prior authorization for [facility] is required.   PA submitted on CoverMyMeds Key [id number] Status is pending   Oral Oncology Clinic will continue to follow.  [place] Patient [place] Phone [phone] Fax [phone] 09/09/2021 8:50 AM

## 2021-09-09 NOTE — Telephone Encounter (Signed)
Oral Oncology Patient Advocate Encounter  Prior Authorization for [facility] has been approved.    PA# [id number] Effective dates: 09/09/21 through 09/08/22  Patients co-pay is [facility] will continue to follow.   [place] Patient [place] Phone [phone] Fax [phone] 09/09/2021 10:08 AM

## 2021-09-09 NOTE — Telephone Encounter (Signed)
Obtained an [employer] through [employer]. Patient is allowed $25000 per calendar year.  Bin [id number]  Group [id number] ID [id number] Expires 08/30/22  [place] Patient [place] Phone [phone] Fax [phone] 09/09/2021 10:12 AM

## 2021-09-09 NOTE — Progress Notes (Signed)
Subjective:    Patient ID: Natasha Graham, female    DOB: March 17, 1961, 61 y.o.   MRN: [id number]  The patient is a 61 year old female here for follow-up on her breast reconstruction.  In 2015 she was diagnosed with right-sided breast cancer.  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  She is 5 feet 3 inches tall and her preoperative bra size was a 34C.  She has a history of scoliosis with spinal rods in place.  She underwent a right breast mastectomy and first part of her reconstruction in September 2015.  In December 2015 she had removal of the expander and placement of the implant on the right breast with a left breast mastopexy for symmetry.  In January 2021 she had a left breast mastectomy for cancer with expander placement.  In July 2021 she had removal of the right implant and left expander with placement of implants.   Left: Smooth round high-profile saline 350 cc implant.  Right: Mentor smooth round high profile saline 450 cc of saline.  Left breast is slightly firm secondary to the radiation.  She looks great and close.  There is no sign lumps, bumps or concerning areas.  She is pleased with her results.  She has slight volume loss in the upper pole of the right breast which is typical.  This does not bother her.   Review of Systems  Constitutional: Negative.   HENT: Negative.    Eyes: Negative.   Respiratory: Negative.    Cardiovascular: Negative.  Negative for leg swelling.  Gastrointestinal: Negative.  Negative for abdominal pain.  Endocrine: Negative.   Genitourinary: Negative.   Musculoskeletal: Negative.   Skin: Negative.   Neurological: Negative.   Hematological: Negative.   Psychiatric/Behavioral: Negative.        Objective:   Physical Exam Vitals reviewed.  Constitutional:      Appearance: Normal appearance.  HENT:     Head: Normocephalic and atraumatic.  Cardiovascular:     Rate and Rhythm: Normal rate.     Pulses: Normal pulses.  Pulmonary:      Effort: Pulmonary effort is normal.  Abdominal:     Palpations: Abdomen is soft.     Tenderness: There is no abdominal tenderness.  Musculoskeletal:        General: No swelling.  Skin:    General: Skin is warm.     Capillary Refill: Capillary refill takes less than 2 seconds.     Coloration: Skin is not jaundiced.     Findings: No bruising.  Neurological:     Mental Status: She is alert and oriented to person, place, and time.  Psychiatric:        Mood and Affect: Mood normal.        Behavior: Behavior normal.        Thought Content: Thought content normal.        Judgment: Judgment normal.       Assessment & Plan:     ICD-10-CM   1. Status post right breast reconstruction  Z98.890       Recommend massage to the left breast.  We talked about ultrasound after 3 years of life of the implants.  I definitely want to see her back in 1 year.  She has an appointment with oncology tomorrow.  She will let me know if there is any changes.  Pictures were obtained of the patient and placed in the chart with the patient's or guardian's permission.

## 2021-09-10 ENCOUNTER — Other Ambulatory Visit (HOSPITAL_COMMUNITY): Payer: Self-pay

## 2021-09-10 ENCOUNTER — Encounter: Payer: Self-pay | Admitting: Hematology

## 2021-09-10 ENCOUNTER — Inpatient Hospital Stay: Payer: BC Managed Care – PPO

## 2021-09-10 ENCOUNTER — Inpatient Hospital Stay: Payer: BC Managed Care – PPO | Attending: Oncology

## 2021-09-10 ENCOUNTER — Inpatient Hospital Stay (HOSPITAL_BASED_OUTPATIENT_CLINIC_OR_DEPARTMENT_OTHER): Payer: BC Managed Care – PPO | Admitting: Hematology

## 2021-09-10 VITALS — BP 157/88 | HR 92 | Temp 98.7°F | Resp 16 | Ht 64.0 in | Wt 169.7 lb

## 2021-09-10 DIAGNOSIS — C50912 Malignant neoplasm of unspecified site of left female breast: Secondary | ICD-10-CM

## 2021-09-10 DIAGNOSIS — C7951 Secondary malignant neoplasm of bone: Secondary | ICD-10-CM

## 2021-09-10 DIAGNOSIS — Z923 Personal history of irradiation: Secondary | ICD-10-CM | POA: Insufficient documentation

## 2021-09-10 DIAGNOSIS — C50512 Malignant neoplasm of lower-outer quadrant of left female breast: Secondary | ICD-10-CM | POA: Diagnosis not present

## 2021-09-10 DIAGNOSIS — Z79899 Other long term (current) drug therapy: Secondary | ICD-10-CM | POA: Diagnosis not present

## 2021-09-10 DIAGNOSIS — Z853 Personal history of malignant neoplasm of breast: Secondary | ICD-10-CM | POA: Insufficient documentation

## 2021-09-10 DIAGNOSIS — Z9221 Personal history of antineoplastic chemotherapy: Secondary | ICD-10-CM | POA: Insufficient documentation

## 2021-09-10 DIAGNOSIS — Z170 Estrogen receptor positive status [ER+]: Secondary | ICD-10-CM | POA: Diagnosis not present

## 2021-09-10 LAB — COMPREHENSIVE METABOLIC PANEL
ALT: 23 U/L (ref 0–44)
AST: 19 U/L (ref 15–41)
Albumin: 4.3 g/dL (ref 3.5–5.0)
Alkaline Phosphatase: 42 U/L (ref 38–126)
Anion gap: 7 (ref 5–15)
BUN: 9 mg/dL (ref 8–23)
CO2: 32 mmol/L (ref 22–32)
Calcium: 9.8 mg/dL (ref 8.9–10.3)
Chloride: 102 mmol/L (ref 98–111)
Creatinine, Ser: 0.73 mg/dL (ref 0.44–1.00)
GFR, Estimated: >60 mL/min (ref >60)
Glucose, Bld: 99 mg/dL (ref 70–99)
Potassium: 3.8 mmol/L (ref 3.5–5.1)
Sodium: 141 mmol/L (ref 135–145)
Total Bilirubin: 0.5 mg/dL (ref 0.3–1.2)
Total Protein: 7 g/dL (ref 6.5–8.1)

## 2021-09-10 LAB — CBC WITH DIFFERENTIAL/PLATELET
Abs Immature Granulocytes: 0.02 10*3/uL (ref 0.00–0.07)
Basophils Absolute: 0.1 10*3/uL (ref 0.0–0.1)
Basophils Relative: 2 %
Eosinophils Absolute: 0 10*3/uL (ref 0.0–0.5)
Eosinophils Relative: 1 %
HCT: 34 % — ABNORMAL LOW (ref 36.0–46.0)
Hemoglobin: 12.3 g/dL (ref 12.0–15.0)
Immature Granulocytes: 1 %
Lymphocytes Relative: 32 %
Lymphs Abs: 0.9 10*3/uL (ref 0.7–4.0)
MCH: 36.4 pg — ABNORMAL HIGH (ref 26.0–34.0)
MCHC: 36.2 g/dL — ABNORMAL HIGH (ref 30.0–36.0)
MCV: 100.6 fL — ABNORMAL HIGH (ref 80.0–100.0)
Monocytes Absolute: 0.4 10*3/uL (ref 0.1–1.0)
Monocytes Relative: 14 %
Neutro Abs: 1.4 10*3/uL — ABNORMAL LOW (ref 1.7–7.7)
Neutrophils Relative %: 50 %
Platelets: 228 10*3/uL (ref 150–400)
RBC: 3.38 MIL/uL — ABNORMAL LOW (ref 3.87–5.11)
RDW: 12.4 % (ref 11.5–15.5)
WBC: 2.8 10*3/uL — ABNORMAL LOW (ref 4.0–10.5)
nRBC: 0 % (ref 0.0–0.2)

## 2021-09-10 MED ORDER — DENOSUMAB 120 MG/1.7ML ~~LOC~~ SOLN
120.0000 mg | Freq: Once | SUBCUTANEOUS | Status: AC
  Administered 2021-09-10: 120 mg via SUBCUTANEOUS
  Filled 2021-09-10: qty 1.7

## 2021-09-10 MED ORDER — PALBOCICLIB 75 MG PO TABS
75.0000 mg | ORAL_TABLET | Freq: Every day | ORAL | 6 refills | Status: DC
  Filled 2021-09-10: qty 21, 28d supply, fill #0
  Filled 2021-10-07: qty 21, 28d supply, fill #1
  Filled 2021-11-05: qty 21, 28d supply, fill #2
  Filled 2021-11-24: qty 21, 28d supply, fill #3
  Filled 2021-12-22: qty 21, 28d supply, fill #4
  Filled 2022-01-21: qty 21, 28d supply, fill #5
  Filled 2022-02-16: qty 21, 28d supply, fill #6

## 2021-09-10 MED ORDER — FULVESTRANT 250 MG/5ML IM SOSY
500.0000 mg | PREFILLED_SYRINGE | Freq: Once | INTRAMUSCULAR | Status: AC
  Administered 2021-09-10: 500 mg via INTRAMUSCULAR
  Filled 2021-09-10: qty 10

## 2021-09-10 NOTE — Patient Instructions (Addendum)
Fulvestrant injection °What is this medication? °FULVESTRANT (ful VES trant) blocks the effects of estrogen. It is used to treat breast cancer. °This medicine may be used for other purposes; ask your health care provider or pharmacist if you have questions. °COMMON BRAND NAME(S): FASLODEX °What should I tell my care team before I take this medication? °They need to know if you have any of these conditions: °bleeding disorders °liver disease °low blood counts, like low white cell, platelet, or red cell counts °an unusual or allergic reaction to fulvestrant, other medicines, foods, dyes, or preservatives °pregnant or trying to get pregnant °breast-feeding °How should I use this medication? °This medicine is for injection into a muscle. It is usually given by a health care professional in a hospital or clinic setting. °Talk to your pediatrician regarding the use of this medicine in children. Special care may be needed. °Overdosage: If you think you have taken too much of this medicine contact a poison control center or emergency room at once. °NOTE: This medicine is only for you. Do not share this medicine with others. °What if I miss a dose? °It is important not to miss your dose. Call your doctor or health care professional if you are unable to keep an appointment. °What may interact with this medication? °medicines that treat or prevent blood clots like warfarin, enoxaparin, dalteparin, apixaban, dabigatran, and rivaroxaban °This list may not describe all possible interactions. Give your health care provider a list of all the medicines, herbs, non-prescription drugs, or dietary supplements you use. Also tell them if you smoke, drink alcohol, or use illegal drugs. Some items may interact with your medicine. °What should I watch for while using this medication? °Your condition will be monitored carefully while you are receiving this medicine. You will need important blood work done while you are taking this  medicine. °Do not become pregnant while taking this medicine or for at least 1 year after stopping it. Women of child-bearing potential will need to have a negative pregnancy test before starting this medicine. Women should inform their doctor if they wish to become pregnant or think they might be pregnant. There is a potential for serious side effects to an unborn child. Men should inform their doctors if they wish to father a child. This medicine may lower sperm counts. Talk to your health care professional or pharmacist for more information. Do not breast-feed an infant while taking this medicine or for 1 year after the last dose. °What side effects may I notice from receiving this medication? °Side effects that you should report to your doctor or health care professional as soon as possible: °allergic reactions like skin rash, itching or hives, swelling of the face, lips, or tongue °feeling faint or lightheaded, falls °pain, tingling, numbness, or weakness in the legs °signs and symptoms of infection like fever or chills; cough; flu-like symptoms; sore throat °vaginal bleeding °Side effects that usually do not require medical attention (report to your doctor or health care professional if they continue or are bothersome): °aches, pains °constipation °diarrhea °headache °hot flashes °nausea, vomiting °pain at site where injected °stomach pain °This list may not describe all possible side effects. Call your doctor for medical advice about side effects. You may report side effects to FDA at 1-800-FDA-1088. °Where should I keep my medication? °This drug is given in a hospital or clinic and will not be stored at home. °NOTE: This sheet is a summary. It may not cover all possible information. If you have   questions about this medicine, talk to your doctor, pharmacist, or health care provider.  Denosumab injection What is this medication? DENOSUMAB (den oh sue mab) slows bone breakdown. Prolia is used to treat  osteoporosis in women after menopause and in men, and in people who are taking corticosteroids for 6 months or more. Delton See is used to treat a high calcium level due to cancer and to prevent bone fractures and other bone problems caused by multiple myeloma or cancer bone metastases. Delton See is also used to treat giant cell tumor of the bone. This medicine may be used for other purposes; ask your health care provider or pharmacist if you have questions. COMMON BRAND NAME(S): Prolia, XGEVA What should I tell my care team before I take this medication? They need to know if you have any of these conditions: dental disease having surgery or tooth extraction infection kidney disease low levels of calcium or Vitamin D in the blood malnutrition on hemodialysis skin conditions or sensitivity thyroid or parathyroid disease an unusual reaction to denosumab, other medicines, foods, dyes, or preservatives pregnant or trying to get pregnant breast-feeding How should I use this medication? This medicine is for injection under the skin. It is given by a health care professional in a hospital or clinic setting. A special MedGuide will be given to you before each treatment. Be sure to read this information carefully each time. For Prolia, talk to your pediatrician regarding the use of this medicine in children. Special care may be needed. For Delton See, talk to your pediatrician regarding the use of this medicine in children. While this drug may be prescribed for children as young as 13 years for selected conditions, precautions do apply. Overdosage: If you think you have taken too much of this medicine contact a poison control center or emergency room at once. NOTE: This medicine is only for you. Do not share this medicine with others. What if I miss a dose? It is important not to miss your dose. Call your doctor or health care professional if you are unable to keep an appointment. What may interact with this  medication? Do not take this medicine with any of the following medications: other medicines containing denosumab This medicine may also interact with the following medications: medicines that lower your chance of fighting infection steroid medicines like prednisone or cortisone This list may not describe all possible interactions. Give your health care provider a list of all the medicines, herbs, non-prescription drugs, or dietary supplements you use. Also tell them if you smoke, drink alcohol, or use illegal drugs. Some items may interact with your medicine. What should I watch for while using this medication? Visit your doctor or health care professional for regular checks on your progress. Your doctor or health care professional may order blood tests and other tests to see how you are doing. Call your doctor or health care professional for advice if you get a fever, chills or sore throat, or other symptoms of a cold or flu. Do not treat yourself. This drug may decrease your body's ability to fight infection. Try to avoid being around people who are sick. You should make sure you get enough calcium and vitamin D while you are taking this medicine, unless your doctor tells you not to. Discuss the foods you eat and the vitamins you take with your health care professional. See your dentist regularly. Brush and floss your teeth as directed. Before you have any dental work done, tell your dentist you are receiving  this medicine. Do not become pregnant while taking this medicine or for 5 months after stopping it. Talk with your doctor or health care professional about your birth control options while taking this medicine. Women should inform their doctor if they wish to become pregnant or think they might be pregnant. There is a potential for serious side effects to an unborn child. Talk to your health care professional or pharmacist for more information. What side effects may I notice from receiving this  medication? Side effects that you should report to your doctor or health care professional as soon as possible: allergic reactions like skin rash, itching or hives, swelling of the face, lips, or tongue bone pain breathing problems dizziness jaw pain, especially after dental work redness, blistering, peeling of the skin signs and symptoms of infection like fever or chills; cough; sore throat; pain or trouble passing urine signs of low calcium like fast heartbeat, muscle cramps or muscle pain; pain, tingling, numbness in the hands or feet; seizures unusual bleeding or bruising unusually weak or tired Side effects that usually do not require medical attention (report to your doctor or health care professional if they continue or are bothersome): constipation diarrhea headache joint pain loss of appetite muscle pain runny nose tiredness upset stomach This list may not describe all possible side effects. Call your doctor for medical advice about side effects. You may report side effects to FDA at 1-800-FDA-1088. Where should I keep my medication? This medicine is only given in a clinic, doctor's office, or other health care setting and will not be stored at home. NOTE: This sheet is a summary. It may not cover all possible information. If you have questions about this medicine, talk to your doctor, pharmacist, or health care provider.  2022 Elsevier/Gold Standard (2017-12-24 00:00:00)   2022 Elsevier/[employer] Standard (2017-11-30 00:00:00)

## 2021-09-10 NOTE — Progress Notes (Signed)
[place]   [phone] [phone]   Clinic Follow up Note   Patient Care Team: Hamrick, Lorin Mercy, MD as PCP - General (Family Medicine) Rolm Bookbinder, MD as Consulting Physician (General Surgery) Magrinat, Virgie Dad, MD as Consulting Physician (Oncology) Mauro Kaufmann, RN as Registered Nurse Rockwell Germany, RN as Registered Nurse Dillingham, Loel Lofty, DO as Attending Physician (Plastic Surgery) Gery Pray, MD as Consulting Physician (Radiation Oncology) Brien Few, MD as Consulting Physician (Obstetrics and Gynecology) Raina Mina, RPH-CPP (Pharmacist)  Date of Service:  09/10/2021  CHIEF COMPLAINT: f/u of metastatic breast cancer  CURRENT THERAPY:  -denosumab and fulvestrant q4weeks, started 08/15/20 -palbociclib started 09/12/20, currently 75 mg daily 3 weeks on/1 week off since 12/2020.  ASSESSMENT & PLAN:  Natasha Graham is a 61 y.o. female with   1. Metastatic Breast Cancer, lobular carcinoma, ER+/PR+/HER2- -initially diagnosed 07/12/19 with stage IA c(T1, N0) invasive lobular carcinoma in left breast, ER+/PR+/Her2-. Left mastectomy on 09/27/19 showed stage IIIA p(T3, N1) ILC, SLND on 11/13/19 was negative (0/7). Mammaprint was low risk. -she received postmastectomy radiation 4/27-02/06/20 and took tamoxifen 7/1-07/2020 -genetics 06/27/20 were negative. -she was found to have bony metastatic disease in 07/2020, confirmed with BM biopsy 08/06/20. -she began Xgeva and fulvestrant on 08/15/20 and palbociclib on 09/12/20. She tolerates well overall. -most recent PET on 07/10/21 showed no FDG-avid disease, diffuse sclerotic bone lesions on CT scan are stable. -she is clinically doing well, tolerating treatment with no side effects. Labs reviewed, CBC stable, CMP WNL. -Continue current therapy, plan to repeat CT scan in May 2022  2. H/o right breast DCIS -diagnosed 10/2013, s/p right lumpectomy 02/26/14.  Recurrent right breast  DCIS found 02/2014, so she proceeded to mastectomy on 05/16/14. -she declined prophylactic antiestrogens at that time  3.  Mild neutropenia -Continue to [place], continue monitoring.  PLAN: -proceed with Xgeva and fulvestrant today and continue every 4 weeks -continue palbociclib, I refilled today -lab and injections monthly as scheduled -Follow-up in 2 months    No problem-specific Assessment & Plan notes found for this encounter.   SUMMARY OF ONCOLOGIC HISTORY: Oncology History  Breast neoplasm, Tis (DCIS), right (Resolved)  05/16/2014 Initial Diagnosis   Breast neoplasm, Tis (DCIS), right   06/27/2020 Genetic Testing   Negative genetic testing on the CancerNext-Expanded+RNAinsight panel testing.  The CancerNext-Expanded gene panel offered by [facility] and includes sequencing and rearrangement analysis for the following 77 genes: AIP, ALK, APC*, ATM*, AXIN2, BAP1, BARD1, BLM, BMPR1A, BRCA1*, BRCA2*, BRIP1*, CDC73, CDH1*, CDK4, CDKN1B, CDKN2A, CHEK2*, CTNNA1, DICER1, FANCC, FH, FLCN, GALNT12, KIF1B, LZTR1, MAX, MEN1, MET, MLH1*, MSH2*, MSH3, MSH6*, MUTYH*, NBN, NF1*, NF2, NTHL1, PALB2*, PHOX2B, PMS2*, POT1, PRKAR1A, PTCH1, PTEN*, RAD51C*, RAD51D*, RB1, RECQL, RET, SDHA, SDHAF2, SDHB, SDHC, SDHD, SMAD4, SMARCA4, SMARCB1, SMARCE1, STK11, SUFU, TMEM127, TP53*, TSC1, TSC2, VHL and XRCC2 (sequencing and deletion/duplication); EGFR, EGLN1, HOXB13, KIT, MITF, PDGFRA, POLD1, and POLE (sequencing only); EPCAM and GREM1 (deletion/duplication only). DNA and RNA analyses performed for * genes. The report date is 06/27/2020.   Malignant neoplasm of lower-outer quadrant of left breast of female, estrogen receptor positive ([place])  10/18/2019 Initial Diagnosis   Malignant neoplasm of lower-outer quadrant of left breast of female, estrogen receptor positive ([place])   08/15/2020 Cancer Staging   Staging form: Breast, AJCC 8th Edition - Clinical: Stage IIIA (cT3, cN1, cM0, G2, ER+, PR-, HER2-) - Signed by  Chauncey Cruel, MD on 08/15/2020    Bone metastases ([place])  08/05/2020 Initial Diagnosis   Bone metastases ([place])   08/15/2020 Cancer Staging   Staging form: Bone - Appendicular Skeleton, Trunk, Skull, and Facial Bones, AJCC 8th Edition - Clinical: Stage IVB (cT3, cN1, cM1b) - Signed by Chauncey Cruel, MD on 08/15/2020       INTERVAL HISTORY:  Natasha Graham is here for a follow up of metastatic breast cancer. She was last seen by Dr. Jana Hakim on 08/14/21. She is transferring her care to me due to his retirement. She presents to the clinic alone. She denies any new concerns at this time. She declines Covid vaccines and flu shot today. I reviewed that ibrance will lower her immune system. She knows to be cautious.   All other systems were reviewed with the patient and are negative.  MEDICAL HISTORY:  Past Medical History:  Diagnosis Date   Cancer [facility])    breast cancer left 2021 roght 2015   Family history of brain cancer    Family history of breast cancer    Family history of colon cancer    Family history of stomach cancer    History of radiation therapy last done February 06 2020   Hypertension    PMB (postmenopausal bleeding)    PONV (postoperative nausea and vomiting)    likes scopolamine patch   Scoliosis    Wears glasses    Wears glasses     SURGICAL HISTORY: Past Surgical History:  Procedure Laterality Date   AUGMENTATION MAMMAPLASTY Right    2015 post mastectomy   AXILLARY LYMPH NODE DISSECTION Left 11/13/2019   Procedure: LEFT AXILLARY LYMPH NODE DISSECTION;  Surgeon: Rolm Bookbinder, MD;  Location: [place];  Service: General;  Laterality: Left;   BREAST IMPLANT EXCHANGE Right 03/19/2021   Procedure: exchange of right saline implant;  Surgeon: Wallace Going, DO;  Location: [place];  Service: Plastics;  Laterality: Right;   BREAST RECONSTRUCTION WITH PLACEMENT OF TISSUE EXPANDER AND FLEX HD (ACELLULAR HYDRATED  DERMIS) Right 05/16/2014   Procedure: IMMEDIATE RIGHT BREAST RECONSTRUCTION WITH PLACEMENT OF TISSUE EXPANDER AND FLEX HD (ACELLULAR HYDRATED DERMIS);  Surgeon: Theodoro Kos, DO;  Location: [place];  Service: Plastics;  Laterality: Right;   BREAST RECONSTRUCTION WITH PLACEMENT OF TISSUE EXPANDER AND FLEX HD (ACELLULAR HYDRATED DERMIS) Left 09/27/2019   Procedure: LEFT BREAST RECONSTRUCTION WITH PLACEMENT OF TISSUE EXPANDER AND FLEX HD (ACELLULAR HYDRATED DERMIS);  Surgeon: Wallace Going, DO;  Location: [place];  Service: Plastics;  Laterality: Left;   BREAST SURGERY     right breast excisional biopsy   DILATATION & CURETTAGE/HYSTEROSCOPY WITH MYOSURE N/A 12/20/2020   Procedure: [place];  Surgeon: Brien Few, MD;  Location: [place];  Service: Gynecology;  Laterality: N/A;   DILATION AND CURETTAGE OF UTERUS     HYSTEROSCOPY WITH D & C N/A 09/20/2015   Procedure: DILATATION AND CURETTAGE /HYSTEROSCOPY;  Surgeon: Brien Few, MD;  Location: [facility];  Service: Gynecology;  Laterality: N/A;   LIPOSUCTION Bilateral 08/08/2014   Procedure: LIPO SUCTION ;  Surgeon: Theodoro Kos, DO;  Location: [place];  Service: Plastics;  Laterality: Bilateral;   MASTECTOMY Right 05/16/2014   placement of acellular dermal matrix & tissue expanders    MASTOPEXY Left 08/08/2014   Procedure:  MASTOPEXY FOR SYMMETRY;  Surgeon: Theodoro Kos, DO;  Location: [place];  Service: Plastics;  Laterality: Left;   NIPPLE SPARING MASTECTOMY WITH SENTINEL LYMPH NODE  BIOPSY Left 09/27/2019   Procedure: LEFT NIPPLE SPARING MASTECTOMY WITH LEFT AXILLARY SENTINEL LYMPH NODE BIOPSY;  Surgeon: Rolm Bookbinder, MD;  Location: [place];  Service: General;  Laterality: Left;   REDUCTION MAMMAPLASTY Left    2015   REMOVAL OF TISSUE EXPANDER AND PLACEMENT OF IMPLANT Right 08/08/2014   Procedure: REMOVAL OF  RIGHT  TISSUE EXPANDERS WITH PLACEMENT OF RIGHT BREAST IMPLANTS WITH LIPO SUCTION ;  Surgeon: Theodoro Kos, DO;  Location: [place];  Service: Plastics;  Laterality: Right;   REMOVAL OF TISSUE EXPANDER AND PLACEMENT OF IMPLANT Left 03/19/2021   Procedure: Removal of left expander for saline implant;  Surgeon: Wallace Going, DO;  Location: [place];  Service: Plastics;  Laterality: Left;  2 hours   ROBOTIC ASSISTED TOTAL HYSTERECTOMY WITH BILATERAL SALPINGO OOPHERECTOMY Bilateral 02/06/2021   Procedure: XI ROBOTIC ASSISTED TOTAL HYSTERECTOMY WITH BILATERAL SALPINGO OOPHORECTOMY, LYSIS of SIGMOID ADHESIONS MCCALL CUL DE PLASTY;  Surgeon: Brien Few, MD;  Location: [place];  Service: Gynecology;  Laterality: Bilateral;   SCAR REVISION Left 11/13/2019   Procedure: EXCISION OF LEFT MASTECTOMY SKIN;  Surgeon: Rolm Bookbinder, MD;  Location: [place];  Service: General;  Laterality: Left;   scoliosis  1972   harrington rods-age 41   TONSILLECTOMY  as child    I have reviewed the social history and family history with the patient and they are unchanged from previous note.  ALLERGIES:  has No Known Allergies.  MEDICATIONS:  Current Outpatient Medications  Medication Sig Dispense Refill   acetaminophen (TYLENOL) 500 MG tablet Take 500 mg by mouth.     Ascorbic Acid (VITAMIN C) 100 MG tablet Take 100 mg by mouth daily. 1000 mg     denosumab (XGEVA) 120 MG/1.7ML SOLN injection Inject 120 mg into the skin once. Monthly     Flaxseed Oil (LINSEED OIL) OIL by Misc.(Non-Drug; Combo Route) route.     Fulvestrant (FASLODEX IM) Inject into the muscle. Shot every 4 weeks.     Ginger, Zingiber officinalis, (GINGER PO) Take by mouth.     ibuprofen (ADVIL) 200 MG tablet Take by mouth. Takes 2 of 200 mg     lisinopril-hydrochlorothiazide (ZESTORETIC) 20-25 MG tablet Take 1 tablet by mouth daily at 2 PM. Pt states lisinopril increased  to 30 mg on 12-12-2020     Magnesium 250 MG TABS Take by mouth.     Multiple Vitamin (MULTIVITAMIN) capsule Take by mouth.     OVER THE COUNTER MEDICATION Calcium 1200mg -Take daily.     OVER THE COUNTER MEDICATION Trubiotics-Take daily.     palbociclib (IBRANCE) 75 MG tablet Take 1 tablet (75 mg total) by mouth daily. Take for 21 days on, 7 days off, repeat every 28 days. 21 tablet 6   Turmeric 500 MG CAPS Take by mouth.     No current facility-administered medications for this visit.    PHYSICAL EXAMINATION: ECOG PERFORMANCE STATUS: 0 - Asymptomatic  Vitals:   09/10/21 1044  BP: (!) 157/88  Pulse: 92  Resp: 16  Temp: 98.7 F (37.1 C)  SpO2: 100%   Wt Readings from Last 3 Encounters:  09/10/21 169 lb 11.2 oz (77 kg)  08/14/21 168 lb 11.2 oz (76.5 kg)  04/24/21 169 lb (76.7 kg)     GENERAL:alert, no distress and comfortable SKIN: skin color, texture, turgor are normal, no rashes or significant lesions EYES: normal, Conjunctiva are pink and non-injected, sclera clear  NECK:  supple, thyroid normal size, non-tender, without nodularity LYMPH:  no palpable lymphadenopathy in the cervical, axillary  LUNGS: clear to auscultation and percussion with normal breathing effort HEART: regular rate & rhythm and no murmurs and no lower extremity edema ABDOMEN:abdomen soft, non-tender and normal bowel sounds Musculoskeletal:no cyanosis of digits and no clubbing  NEURO: alert & oriented x 3 with fluent speech, no focal motor/sensory deficits BREAST: No palpable mass, nodules or adenopathy bilaterally. Breast exam benign.   LABORATORY DATA:  I have reviewed the data as listed CBC Latest Ref Rng & Units 09/10/2021 08/14/2021 07/16/2021  WBC 4.0 - 10.5 K/uL 2.8(L) 2.8(L) 2.6(L)  Hemoglobin 12.0 - 15.0 g/dL 12.3 12.3 12.1  Hematocrit 36.0 - 46.0 % 34.0(L) 34.1(L) 34.1(L)  Platelets 150 - 400 K/uL 228 237 209     CMP Latest Ref Rng & Units 09/10/2021 08/14/2021 07/16/2021  Glucose 70 - 99  mg/dL 99 93 114(H)  BUN 8 - 23 mg/dL $Remove'9 12 10  'sqtgcUK$ Creatinine 0.44 - 1.00 mg/dL 0.73 0.96 0.84  Sodium 135 - 145 mmol/L 141 142 141  Potassium 3.5 - 5.1 mmol/L 3.8 3.7 3.6  Chloride 98 - 111 mmol/L 102 105 103  CO2 22 - 32 mmol/L 32 26 28  Calcium 8.9 - 10.3 mg/dL 9.8 9.1 9.1  Total Protein 6.5 - 8.1 g/dL 7.0 6.7 6.5  Total Bilirubin 0.3 - 1.2 mg/dL 0.5 0.5 0.3  Alkaline Phos 38 - 126 U/L 42 46 48  AST 15 - 41 U/L $Remo'19 19 21  'eivmy$ ALT 0 - 44 U/L $Remo'23 24 26      'xLPAw$ RADIOGRAPHIC STUDIES: I have personally reviewed the radiological images as listed and agreed with the findings in the report. No results found.    Orders Placed This Encounter  Procedures   Cancer antigen 27.29    Standing Status:   Standing    Number of Occurrences:   20    Standing Expiration Date:   09/10/2022   All questions were answered. The patient knows to call the clinic with any problems, questions or concerns. No barriers to learning was detected. The total time spent in the appointment was 40 minutes.     Truitt Merle, MD 09/10/2021   I, Wilburn Mylar, am acting as scribe for Truitt Merle, MD.   I have reviewed the above documentation for accuracy and completeness, and I agree with the above.

## 2021-09-15 ENCOUNTER — Telehealth: Payer: Self-pay | Admitting: Hematology

## 2021-09-15 NOTE — Telephone Encounter (Signed)
Scheduled follow-up appointment per 11/1 los. Patient is aware. 

## 2021-09-29 ENCOUNTER — Other Ambulatory Visit (HOSPITAL_COMMUNITY): Payer: Self-pay

## 2021-10-01 ENCOUNTER — Other Ambulatory Visit: Payer: Self-pay

## 2021-10-01 NOTE — Progress Notes (Unsigned)
Changed plan provider. Gardiner Rhyme, RN

## 2021-10-07 ENCOUNTER — Other Ambulatory Visit (HOSPITAL_COMMUNITY): Payer: Self-pay

## 2021-10-08 ENCOUNTER — Ambulatory Visit: Payer: BC Managed Care – PPO

## 2021-10-08 ENCOUNTER — Inpatient Hospital Stay: Payer: BC Managed Care – PPO | Attending: Oncology

## 2021-10-08 ENCOUNTER — Other Ambulatory Visit: Payer: Self-pay

## 2021-10-08 ENCOUNTER — Other Ambulatory Visit: Payer: BC Managed Care – PPO

## 2021-10-08 ENCOUNTER — Inpatient Hospital Stay: Payer: BC Managed Care – PPO

## 2021-10-08 VITALS — BP 156/88 | HR 79 | Temp 98.6°F | Resp 16

## 2021-10-08 DIAGNOSIS — Z170 Estrogen receptor positive status [ER+]: Secondary | ICD-10-CM | POA: Diagnosis not present

## 2021-10-08 DIAGNOSIS — C50912 Malignant neoplasm of unspecified site of left female breast: Secondary | ICD-10-CM

## 2021-10-08 DIAGNOSIS — Z79899 Other long term (current) drug therapy: Secondary | ICD-10-CM | POA: Insufficient documentation

## 2021-10-08 DIAGNOSIS — C50512 Malignant neoplasm of lower-outer quadrant of left female breast: Secondary | ICD-10-CM

## 2021-10-08 DIAGNOSIS — C7951 Secondary malignant neoplasm of bone: Secondary | ICD-10-CM

## 2021-10-08 DIAGNOSIS — Z853 Personal history of malignant neoplasm of breast: Secondary | ICD-10-CM | POA: Insufficient documentation

## 2021-10-08 DIAGNOSIS — Z923 Personal history of irradiation: Secondary | ICD-10-CM | POA: Diagnosis not present

## 2021-10-08 DIAGNOSIS — Z9221 Personal history of antineoplastic chemotherapy: Secondary | ICD-10-CM | POA: Insufficient documentation

## 2021-10-08 LAB — CBC WITH DIFFERENTIAL/PLATELET
Abs Immature Granulocytes: 0.01 10*3/uL (ref 0.00–0.07)
Basophils Absolute: 0.1 10*3/uL (ref 0.0–0.1)
Basophils Relative: 2 %
Eosinophils Absolute: 0 10*3/uL (ref 0.0–0.5)
Eosinophils Relative: 1 %
HCT: 34 % — ABNORMAL LOW (ref 36.0–46.0)
Hemoglobin: 12.4 g/dL (ref 12.0–15.0)
Immature Granulocytes: 0 %
Lymphocytes Relative: 31 %
Lymphs Abs: 0.8 10*3/uL (ref 0.7–4.0)
MCH: 36.5 pg — ABNORMAL HIGH (ref 26.0–34.0)
MCHC: 36.5 g/dL — ABNORMAL HIGH (ref 30.0–36.0)
MCV: 100 fL (ref 80.0–100.0)
Monocytes Absolute: 0.4 10*3/uL (ref 0.1–1.0)
Monocytes Relative: 15 %
Neutro Abs: 1.3 10*3/uL — ABNORMAL LOW (ref 1.7–7.7)
Neutrophils Relative %: 51 %
Platelets: 227 10*3/uL (ref 150–400)
RBC: 3.4 MIL/uL — ABNORMAL LOW (ref 3.87–5.11)
RDW: 12.5 % (ref 11.5–15.5)
WBC: 2.6 10*3/uL — ABNORMAL LOW (ref 4.0–10.5)
nRBC: 0 % (ref 0.0–0.2)

## 2021-10-08 LAB — COMPREHENSIVE METABOLIC PANEL
ALT: 20 U/L (ref 0–44)
AST: 19 U/L (ref 15–41)
Albumin: 4.2 g/dL (ref 3.5–5.0)
Alkaline Phosphatase: 42 U/L (ref 38–126)
Anion gap: 7 (ref 5–15)
BUN: 12 mg/dL (ref 8–23)
CO2: 31 mmol/L (ref 22–32)
Calcium: 9.5 mg/dL (ref 8.9–10.3)
Chloride: 103 mmol/L (ref 98–111)
Creatinine, Ser: 0.82 mg/dL (ref 0.44–1.00)
GFR, Estimated: >60 mL/min (ref >60)
Glucose, Bld: 106 mg/dL — ABNORMAL HIGH (ref 70–99)
Potassium: 3.5 mmol/L (ref 3.5–5.1)
Sodium: 141 mmol/L (ref 135–145)
Total Bilirubin: 0.5 mg/dL (ref 0.3–1.2)
Total Protein: 6.8 g/dL (ref 6.5–8.1)

## 2021-10-08 MED ORDER — FULVESTRANT 250 MG/5ML IM SOSY
500.0000 mg | PREFILLED_SYRINGE | Freq: Once | INTRAMUSCULAR | Status: AC
  Administered 2021-10-08: 500 mg via INTRAMUSCULAR
  Filled 2021-10-08: qty 10

## 2021-10-08 MED ORDER — DENOSUMAB 120 MG/1.7ML ~~LOC~~ SOLN
120.0000 mg | Freq: Once | SUBCUTANEOUS | Status: AC
  Administered 2021-10-08: 120 mg via SUBCUTANEOUS
  Filled 2021-10-08: qty 1.7

## 2021-10-09 LAB — CANCER ANTIGEN 27.29: CA 27.29: 36 U/mL (ref 0.0–38.6)

## 2021-10-27 ENCOUNTER — Other Ambulatory Visit (HOSPITAL_COMMUNITY): Payer: Self-pay

## 2021-10-29 ENCOUNTER — Telehealth: Payer: Self-pay | Admitting: Hematology

## 2021-10-29 NOTE — Telephone Encounter (Signed)
Rescheduled upcoming appointment per provider's request. Patient is aware of changes. 

## 2021-11-03 NOTE — Progress Notes (Signed)
[place] OFFICE PROGRESS NOTE  Hamrick, Lorin Mercy, MD [place] [address]  DIAGNOSIS:  Oncology History  Breast neoplasm, Tis (DCIS), right (Resolved)  05/16/2014 Initial Diagnosis   Breast neoplasm, Tis (DCIS), right   06/27/2020 Genetic Testing   Negative genetic testing on the CancerNext-Expanded+RNAinsight panel testing.  The CancerNext-Expanded gene panel offered by [facility] and includes sequencing and rearrangement analysis for the following 77 genes: AIP, ALK, APC*, ATM*, AXIN2, BAP1, BARD1, BLM, BMPR1A, BRCA1*, BRCA2*, BRIP1*, CDC73, CDH1*, CDK4, CDKN1B, CDKN2A, CHEK2*, CTNNA1, DICER1, FANCC, FH, FLCN, GALNT12, KIF1B, LZTR1, MAX, MEN1, MET, MLH1*, MSH2*, MSH3, MSH6*, MUTYH*, NBN, NF1*, NF2, NTHL1, PALB2*, PHOX2B, PMS2*, POT1, PRKAR1A, PTCH1, PTEN*, RAD51C*, RAD51D*, RB1, RECQL, RET, SDHA, SDHAF2, SDHB, SDHC, SDHD, SMAD4, SMARCA4, SMARCB1, SMARCE1, STK11, SUFU, TMEM127, TP53*, TSC1, TSC2, VHL and XRCC2 (sequencing and deletion/duplication); EGFR, EGLN1, HOXB13, KIT, MITF, PDGFRA, POLD1, and POLE (sequencing only); EPCAM and GREM1 (deletion/duplication only). DNA and RNA analyses performed for * genes. The report date is 06/27/2020.   Malignant neoplasm of lower-outer quadrant of left breast of female, estrogen receptor positive ([place])  10/18/2019 Initial Diagnosis   Malignant neoplasm of lower-outer quadrant of left breast of female, estrogen receptor positive ([place])   08/15/2020 Cancer Staging   Staging form: Breast, AJCC 8th Edition - Clinical: Stage IIIA (cT3, cN1, cM0, G2, ER+, PR-, HER2-) - Signed by Chauncey Cruel, MD on 08/15/2020    Bone metastases ([place])  08/05/2020 Initial Diagnosis   Bone metastases ([place])   08/15/2020 Cancer Staging   Staging form: Bone - Appendicular Skeleton, Trunk, Skull, and Facial Bones, AJCC 8th Edition - Clinical: Stage IVB (cT3, cN1, cM1b) - Signed by Chauncey Cruel, MD on 08/15/2020      CURRENT  THERAPY: -denosumab and fulvestrant q4weeks, started 08/15/20 -palbociclib started 09/12/20, currently 75 mg daily 3 weeks on/1 week off since 12/2020.  INTERVAL HISTORY: Natasha Graham 61 y.o. female returns to the clinic today for a follow-up visit.  The patient was last seen in the clinic on 09/10/2021 by Dr. Burr Medico.  She is currently undergoing treatment with Xgeva and  fulvestrant monthly.  She is also undergoing Ibrance daily for 3 weeks on 1 week off.  She is tolerating her treatment well without any concerning adverse side effects.  Today, she denies any concerning complaints or changes with her health. She has some mild/chronic joint pains when she wakes up in the morning but it usually subsides once she starts moving around. She states she particularly has some soreness in the hips. She notes she has had chronic low back pain for several years as well. She also is very active taking care of her 55 month and 3 year old grandchildren M-Fri. She sometimes will take ibuprofen or tylenol if needed for joint pain as recommended previously by Dr. Jana Hakim. She is wondering if she can take glucosamine and chondroitin with her current treatment. She rates the discomfort a 2-3/10 for how it impacts her activities of daily living.   Otherwise, she reports stable energy. Of course, she notes that she is often tired due to taking care of her grandchildren while still working. She denies any fever, chills, night sweats, or unexplained weight loss.  Denies any skin infections, dysuria, abdominal pain, upper respiratory infections, cough, or shortness of breath.  She denies any nausea, vomiting, diarrhea, or constipation.  Denies any changes with her weight or appetite. She is here today for evaluation and repeat blood work  before undergoing her next treatment.   MEDICAL HISTORY: Past Medical History:  Diagnosis Date   Cancer [facility])    breast cancer left 2021 roght 2015   Family history of brain cancer     Family history of breast cancer    Family history of colon cancer    Family history of stomach cancer    History of radiation therapy last done February 06 2020   Hypertension    PMB (postmenopausal bleeding)    PONV (postoperative nausea and vomiting)    likes scopolamine patch   Scoliosis    Wears glasses    Wears glasses     ALLERGIES:  has No Known Allergies.  MEDICATIONS:  Current Outpatient Medications  Medication Sig Dispense Refill   acetaminophen (TYLENOL) 500 MG tablet Take 500 mg by mouth.     Ascorbic Acid (VITAMIN C) 100 MG tablet Take 100 mg by mouth daily. 1000 mg     denosumab (XGEVA) 120 MG/1.7ML SOLN injection Inject 120 mg into the skin once. Monthly     Flaxseed Oil (LINSEED OIL) OIL by Misc.(Non-Drug; Combo Route) route.     Fulvestrant (FASLODEX IM) Inject into the muscle. Shot every 4 weeks.     Ginger, Zingiber officinalis, (GINGER PO) Take by mouth.     ibuprofen (ADVIL) 200 MG tablet Take by mouth. Takes 2 of 200 mg     lisinopril-hydrochlorothiazide (ZESTORETIC) 20-25 MG tablet Take 1 tablet by mouth daily at 2 PM. Pt states lisinopril increased to 30 mg on 12-12-2020     Magnesium 250 MG TABS Take by mouth.     Multiple Vitamin (MULTIVITAMIN) capsule Take by mouth.     OVER THE COUNTER MEDICATION Calcium 1285m-Take daily.     OVER THE COUNTER MEDICATION Trubiotics-Take daily.     palbociclib (IBRANCE) 75 MG tablet Take 1 tablet (75 mg total) by mouth daily. Take for 21 days on, 7 days off, repeat every 28 days. 21 tablet 6   Turmeric 500 MG CAPS Take by mouth.     No current facility-administered medications for this visit.    SURGICAL HISTORY:  Past Surgical History:  Procedure Laterality Date   AUGMENTATION MAMMAPLASTY Right    2015 post mastectomy   AXILLARY LYMPH NODE DISSECTION Left 11/13/2019   Procedure: LEFT AXILLARY LYMPH NODE DISSECTION;  Surgeon: WRolm Bookbinder MD;  [place]  Service: General;  Laterality:  Left;   BREAST IMPLANT EXCHANGE Right 03/19/2021   Procedure: exchange of right saline implant;  Surgeon: DWallace Going DO;  [place]  Service: Plastics;  Laterality: Right;   BREAST RECONSTRUCTION WITH PLACEMENT OF TISSUE EXPANDER AND FLEX HD (ACELLULAR HYDRATED DERMIS) Right 05/16/2014   Procedure: IMMEDIATE RIGHT BREAST RECONSTRUCTION WITH PLACEMENT OF TISSUE EXPANDER AND FLEX HD (ACELLULAR HYDRATED DERMIS);  Surgeon: CTheodoro Kos DO;  [place]  Service: Plastics;  Laterality: Right;   BREAST RECONSTRUCTION WITH PLACEMENT OF TISSUE EXPANDER AND FLEX HD (ACELLULAR HYDRATED DERMIS) Left 09/27/2019   Procedure: LEFT BREAST RECONSTRUCTION WITH PLACEMENT OF TISSUE EXPANDER AND FLEX HD (ACELLULAR HYDRATED DERMIS);  Surgeon: DWallace Going DO;  [place]  Service: Plastics;  Laterality: Left;   BREAST SURGERY     right breast excisional biopsy   DILATATION & CURETTAGE/HYSTEROSCOPY WITH MYOSURE N/A 12/20/2020   [place]cedure[place]  Surgeon: TBrien Few MD;  [place]  Service: Gynecology;  Laterality: N/A;  DILATION AND CURETTAGE OF UTERUS     HYSTEROSCOPY WITH D & C N/A 09/20/2015   Procedure: DILATATION AND CURETTAGE /HYSTEROSCOPY;  Surgeon: Brien Few, MD;  Location: [facility];  Service: Gynecology;  Laterality: N/A;   LIPOSUCTION Bilateral 08/08/2014   Procedure: LIPO SUCTION ;  Surgeon: Theodoro Kos, DO;  Location: [place];  Service: Plastics;  Laterality: Bilateral;   MASTECTOMY Right 05/16/2014   placement of acellular dermal matrix & tissue expanders    MASTOPEXY Left 08/08/2014   Procedure:  MASTOPEXY FOR SYMMETRY;  Surgeon: Theodoro Kos, DO;  Location: [place];  Service: Plastics;  Laterality: Left;   NIPPLE SPARING MASTECTOMY WITH SENTINEL LYMPH NODE BIOPSY Left 09/27/2019   Procedure: LEFT NIPPLE SPARING  MASTECTOMY WITH LEFT AXILLARY SENTINEL LYMPH NODE BIOPSY;  Surgeon: Rolm Bookbinder, MD;  Location: [place];  Service: General;  Laterality: Left;   REDUCTION MAMMAPLASTY Left    2015   REMOVAL OF TISSUE EXPANDER AND PLACEMENT OF IMPLANT Right 08/08/2014   Procedure: REMOVAL OF RIGHT  TISSUE EXPANDERS WITH PLACEMENT OF RIGHT BREAST IMPLANTS WITH LIPO SUCTION ;  Surgeon: Theodoro Kos, DO;  Location: [place];  Service: Plastics;  Laterality: Right;   REMOVAL OF TISSUE EXPANDER AND PLACEMENT OF IMPLANT Left 03/19/2021   Procedure: Removal of left expander for saline implant;  Surgeon: Wallace Going, DO;  Location: [place];  Service: Plastics;  Laterality: Left;  2 hours   ROBOTIC ASSISTED TOTAL HYSTERECTOMY WITH BILATERAL SALPINGO OOPHERECTOMY Bilateral 02/06/2021   Procedure: XI ROBOTIC ASSISTED TOTAL HYSTERECTOMY WITH BILATERAL SALPINGO OOPHORECTOMY, LYSIS of SIGMOID ADHESIONS MCCALL CUL DE PLASTY;  Surgeon: Brien Few, MD;  Location: [place];  Service: Gynecology;  Laterality: Bilateral;   SCAR REVISION Left 11/13/2019   Procedure: EXCISION OF LEFT MASTECTOMY SKIN;  Surgeon: Rolm Bookbinder, MD;  Location: [place];  Service: General;  Laterality: Left;   scoliosis  1972   harrington rods-age 69   TONSILLECTOMY  as child    REVIEW OF SYSTEMS:   Review of Systems  Constitutional: Negative for appetite change, chills, fatigue, fever and unexpected weight change.  HENT: Negative for mouth sores, nosebleeds, sore throat and trouble swallowing.   Eyes: Negative for eye problems and icterus.  Respiratory: Negative for cough, hemoptysis, shortness of breath and wheezing.   Cardiovascular: Negative for chest pain and leg swelling.  Gastrointestinal: Negative for abdominal pain, constipation, diarrhea, nausea and vomiting.  Genitourinary: Negative for bladder incontinence, difficulty urinating,  dysuria, frequency and hematuria.   Musculoskeletal: Positive for mild arthralgias. Negative for gait problem, neck pain and neck stiffness.  Skin: Negative for itching and rash.  Neurological: Negative for dizziness, extremity weakness, gait problem, headaches, light-headedness and seizures.  Hematological: Negative for adenopathy. Does not bruise/bleed easily.  Psychiatric/Behavioral: Negative for confusion, depression and sleep disturbance. The patient is not nervous/anxious.     PHYSICAL EXAMINATION:  Last menstrual period 10/16/2013.  ECOG PERFORMANCE STATUS: 1  Physical Exam  Constitutional: Oriented to person, place, and time and well-developed, well-nourished, and in no distress. HENT:  Head: Normocephalic and atraumatic.  Mouth/Throat: Oropharynx is clear and moist. No oropharyngeal exudate.  Eyes: Conjunctivae are normal. Right eye exhibits no discharge. Left eye exhibits no discharge. No scleral icterus.  Neck: Normal range of motion. Neck supple.  Cardiovascular: Normal rate, regular rhythm, normal heart sounds and intact distal pulses.   Pulmonary/Chest: Effort normal and breath sounds normal. No respiratory  distress. No wheezes. No rales.  Abdominal: Soft. Bowel sounds are normal. Exhibits no distension and no mass. There is no tenderness.  Musculoskeletal: Normal range of motion. Exhibits no edema.  Lymphadenopathy:    No cervical adenopathy.  Neurological: Alert and oriented to person, place, and time. Exhibits normal muscle tone. Gait normal. Coordination normal.  Skin: Skin is warm and dry. No rash noted. Not diaphoretic. No erythema. No pallor.  Breasts: Breast inspection showed post surgical/radiation changes. Palpation of the breasts and axilla revealed no obvious mass that I could appreciate. Psychiatric: Mood, memory and judgment normal.  Vitals reviewed.  LABORATORY DATA: Lab Results  Component Value Date   WBC 2.6 (L) 10/08/2021   HGB 12.4 10/08/2021   HCT  34.0 (L) 10/08/2021   MCV 100.0 10/08/2021   PLT 227 10/08/2021      Chemistry      Component Value Date/Time   NA 141 10/08/2021 0746   NA 143 07/30/2014 1603   K 3.5 10/08/2021 0746   K 3.5 07/30/2014 1603   CL 103 10/08/2021 0746   CO2 31 10/08/2021 0746   CO2 30 (H) 07/30/2014 1603   BUN 12 10/08/2021 0746   BUN 10.5 07/30/2014 1603   CREATININE 0.82 10/08/2021 0746   CREATININE 0.9 07/30/2014 1603      Component Value Date/Time   CALCIUM 9.5 10/08/2021 0746   CALCIUM 9.5 07/30/2014 1603   ALKPHOS 42 10/08/2021 0746   ALKPHOS 78 07/30/2014 1603   AST 19 10/08/2021 0746   AST 23 07/30/2014 1603   ALT 20 10/08/2021 0746   ALT 23 07/30/2014 1603   BILITOT 0.5 10/08/2021 0746   BILITOT 0.29 07/30/2014 1603       RADIOGRAPHIC STUDIES:  No results found.   ASSESSMENT/PLAN:  1. Metastatic Breast Cancer, lobular carcinoma, ER+/PR+/HER2- -initially diagnosed 07/12/19 with stage IA c(T1, N0) invasive lobular carcinoma in left breast, ER+/PR+/Her2-. Left mastectomy on 09/27/19 showed stage IIIA p(T3, N1) ILC, SLND on 11/13/19 was negative (0/7). Mammaprint was low risk. -She received postmastectomy radiation 4/27-02/06/20 and took tamoxifen 7/1-07/2020 -genetics 06/27/20 were negative. -She was found to have bony metastatic disease in 07/2020, confirmed with BM biopsy 08/06/20. -she began Xgeva and fulvestrant on 08/15/20 and palbociclib on 09/12/20. She tolerates well overall. -most recent PET on 07/10/21 showed no FDG-avid disease, diffuse sclerotic bone lesions on CT scan are stable. -she is clinically doing well, tolerating treatment with no side effects. Labs reviewed, CBC stable, continues to have similar neutropenia. Has mild hypokalemia on labs.  -Will order restaging bone scan and CT CAP before her next visit in May 2023. -Discussed I did not see any significant drug interactions with her xgeva and ibrance with glucosamine and chondroitin. I also confirmed with pharmacy.  Discussed she can try to take this if she desires. Also encouraged her to take tylenol if needed. She can take ibuprofen too if needed as she has good kidney function but encouraged her to try to use tylenol when possible. She reports her joint pain does not significantly impact her life. It typically eases off with moving. She also reports she is very active with her 19 month old and 45 year old grandchild. Her bone pain is stable but discussed we will reassess her bone mets on upcoming imaging studies.  -Continue current therapy, F/U in 2 months on 12/31/21    2. H/o right breast DCIS -diagnosed 10/2013, s/p right lumpectomy 02/26/14.  Recurrent right breast DCIS found 02/2014, so she proceeded  to mastectomy on 05/16/14. -she declined prophylactic antiestrogens at that time   3.  Mild neutropenia -Continue to [place], continue monitoring.  4. Hypokalemia -Patient has mild hypokalemia on labs.  -She takes a 99 mg potassium daily. She states she has been on this for some time. Despite this, her potassium typically is on the low end of normal/slightly low.  -I will send a short 4 day prescription of potassium chloride 20 mcg to take 1 tablet daily. I told her to not take her OTC supplement while taking the potassium chloride prescription I have sent in.    PLAN: -proceed with Xgeva and fulvestrant today and continue every 4 weeks -continue palbociclib. -lab and injections monthly as scheduled -Follow-up in 2 months  -CT CAP and NM Bone scan before next MD visit with YF in 2 months -Rx sent for Kcl      No orders of the defined types were placed in this encounter.    The total time spent in the appointment was 20-29 minutes.   Merriam Brandner L Kanaan Kagawa, PA-C 11/03/21

## 2021-11-05 ENCOUNTER — Other Ambulatory Visit: Payer: BC Managed Care – PPO

## 2021-11-05 ENCOUNTER — Ambulatory Visit: Payer: BC Managed Care – PPO

## 2021-11-05 ENCOUNTER — Other Ambulatory Visit (HOSPITAL_COMMUNITY): Payer: Self-pay

## 2021-11-06 ENCOUNTER — Inpatient Hospital Stay: Payer: BC Managed Care – PPO | Admitting: Hematology

## 2021-11-06 ENCOUNTER — Inpatient Hospital Stay: Payer: BC Managed Care – PPO

## 2021-11-06 ENCOUNTER — Other Ambulatory Visit: Payer: Self-pay

## 2021-11-06 ENCOUNTER — Inpatient Hospital Stay (HOSPITAL_BASED_OUTPATIENT_CLINIC_OR_DEPARTMENT_OTHER): Payer: BC Managed Care – PPO | Admitting: Physician Assistant

## 2021-11-06 ENCOUNTER — Inpatient Hospital Stay: Payer: BC Managed Care – PPO | Attending: Oncology

## 2021-11-06 VITALS — BP 163/76 | HR 83 | Temp 97.7°F | Ht 64.0 in | Wt 169.4 lb

## 2021-11-06 DIAGNOSIS — Z5111 Encounter for antineoplastic chemotherapy: Secondary | ICD-10-CM | POA: Insufficient documentation

## 2021-11-06 DIAGNOSIS — Z170 Estrogen receptor positive status [ER+]: Secondary | ICD-10-CM | POA: Insufficient documentation

## 2021-11-06 DIAGNOSIS — E876 Hypokalemia: Secondary | ICD-10-CM

## 2021-11-06 DIAGNOSIS — Z9013 Acquired absence of bilateral breasts and nipples: Secondary | ICD-10-CM | POA: Diagnosis not present

## 2021-11-06 DIAGNOSIS — Z923 Personal history of irradiation: Secondary | ICD-10-CM | POA: Diagnosis not present

## 2021-11-06 DIAGNOSIS — C50912 Malignant neoplasm of unspecified site of left female breast: Secondary | ICD-10-CM

## 2021-11-06 DIAGNOSIS — C50512 Malignant neoplasm of lower-outer quadrant of left female breast: Secondary | ICD-10-CM

## 2021-11-06 DIAGNOSIS — D709 Neutropenia, unspecified: Secondary | ICD-10-CM | POA: Insufficient documentation

## 2021-11-06 DIAGNOSIS — C7951 Secondary malignant neoplasm of bone: Secondary | ICD-10-CM

## 2021-11-06 DIAGNOSIS — Z79899 Other long term (current) drug therapy: Secondary | ICD-10-CM | POA: Diagnosis not present

## 2021-11-06 LAB — CBC WITH DIFFERENTIAL/PLATELET
Abs Immature Granulocytes: 0 10*3/uL (ref 0.00–0.07)
Basophils Absolute: 0.1 10*3/uL (ref 0.0–0.1)
Basophils Relative: 2 %
Eosinophils Absolute: 0 10*3/uL (ref 0.0–0.5)
Eosinophils Relative: 1 %
HCT: 31.8 % — ABNORMAL LOW (ref 36.0–46.0)
Hemoglobin: 11.9 g/dL — ABNORMAL LOW (ref 12.0–15.0)
Immature Granulocytes: 0 %
Lymphocytes Relative: 37 %
Lymphs Abs: 1 10*3/uL (ref 0.7–4.0)
MCH: 37.2 pg — ABNORMAL HIGH (ref 26.0–34.0)
MCHC: 37.4 g/dL — ABNORMAL HIGH (ref 30.0–36.0)
MCV: 99.4 fL (ref 80.0–100.0)
Monocytes Absolute: 0.4 10*3/uL (ref 0.1–1.0)
Monocytes Relative: 14 %
Neutro Abs: 1.2 10*3/uL — ABNORMAL LOW (ref 1.7–7.7)
Neutrophils Relative %: 46 %
Platelets: 225 10*3/uL (ref 150–400)
RBC: 3.2 MIL/uL — ABNORMAL LOW (ref 3.87–5.11)
RDW: 12.8 % (ref 11.5–15.5)
WBC: 2.6 10*3/uL — ABNORMAL LOW (ref 4.0–10.5)
nRBC: 0 % (ref 0.0–0.2)

## 2021-11-06 LAB — COMPREHENSIVE METABOLIC PANEL
ALT: 18 U/L (ref 0–44)
AST: 17 U/L (ref 15–41)
Albumin: 4.3 g/dL (ref 3.5–5.0)
Alkaline Phosphatase: 45 U/L (ref 38–126)
Anion gap: 7 (ref 5–15)
BUN: 11 mg/dL (ref 8–23)
CO2: 30 mmol/L (ref 22–32)
Calcium: 9.3 mg/dL (ref 8.9–10.3)
Chloride: 102 mmol/L (ref 98–111)
Creatinine, Ser: 0.73 mg/dL (ref 0.44–1.00)
GFR, Estimated: >60 mL/min (ref >60)
Glucose, Bld: 98 mg/dL (ref 70–99)
Potassium: 3.2 mmol/L — ABNORMAL LOW (ref 3.5–5.1)
Sodium: 139 mmol/L (ref 135–145)
Total Bilirubin: 0.4 mg/dL (ref 0.3–1.2)
Total Protein: 6.6 g/dL (ref 6.5–8.1)

## 2021-11-06 MED ORDER — POTASSIUM CHLORIDE CRYS ER 20 MEQ PO TBCR: 20.0000 meq | EXTENDED_RELEASE_TABLET | Freq: Every day | ORAL | 0 refills | Status: DC

## 2021-11-06 MED ORDER — FULVESTRANT 250 MG/5ML IM SOSY
500.0000 mg | PREFILLED_SYRINGE | Freq: Once | INTRAMUSCULAR | Status: AC
  Administered 2021-11-06: 16:00:00 500 mg via INTRAMUSCULAR
  Filled 2021-11-06: qty 10

## 2021-11-06 MED ORDER — DENOSUMAB 120 MG/1.7ML ~~LOC~~ SOLN
120.0000 mg | Freq: Once | SUBCUTANEOUS | Status: AC
  Administered 2021-11-06: 16:00:00 120 mg via SUBCUTANEOUS
  Filled 2021-11-06: qty 1.7

## 2021-11-07 LAB — CANCER ANTIGEN 27.29: CA 27.29: 36.2 U/mL (ref 0.0–38.6)

## 2021-11-24 ENCOUNTER — Other Ambulatory Visit (HOSPITAL_COMMUNITY): Payer: Self-pay

## 2021-11-30 ENCOUNTER — Encounter: Payer: Self-pay | Admitting: Hematology

## 2021-12-02 ENCOUNTER — Other Ambulatory Visit: Payer: Self-pay

## 2021-12-02 ENCOUNTER — Other Ambulatory Visit (HOSPITAL_COMMUNITY): Payer: Self-pay

## 2021-12-02 DIAGNOSIS — Z170 Estrogen receptor positive status [ER+]: Secondary | ICD-10-CM

## 2021-12-03 ENCOUNTER — Other Ambulatory Visit: Payer: Self-pay

## 2021-12-03 ENCOUNTER — Other Ambulatory Visit: Payer: BC Managed Care – PPO

## 2021-12-03 ENCOUNTER — Inpatient Hospital Stay: Payer: BC Managed Care – PPO | Attending: Oncology

## 2021-12-03 ENCOUNTER — Inpatient Hospital Stay: Payer: BC Managed Care – PPO

## 2021-12-03 ENCOUNTER — Ambulatory Visit: Payer: BC Managed Care – PPO

## 2021-12-03 VITALS — BP 148/87 | HR 83 | Temp 98.1°F | Resp 17

## 2021-12-03 DIAGNOSIS — C7951 Secondary malignant neoplasm of bone: Secondary | ICD-10-CM

## 2021-12-03 DIAGNOSIS — Z5111 Encounter for antineoplastic chemotherapy: Secondary | ICD-10-CM | POA: Insufficient documentation

## 2021-12-03 DIAGNOSIS — C50512 Malignant neoplasm of lower-outer quadrant of left female breast: Secondary | ICD-10-CM | POA: Insufficient documentation

## 2021-12-03 LAB — CBC WITH DIFFERENTIAL (CANCER CENTER ONLY)
Abs Immature Granulocytes: 0.01 10*3/uL (ref 0.00–0.07)
Basophils Absolute: 0.1 10*3/uL (ref 0.0–0.1)
Basophils Relative: 3 %
Eosinophils Absolute: 0 10*3/uL (ref 0.0–0.5)
Eosinophils Relative: 2 %
HCT: 34 % — ABNORMAL LOW (ref 36.0–46.0)
Hemoglobin: 12.2 g/dL (ref 12.0–15.0)
Immature Granulocytes: 1 %
Lymphocytes Relative: 36 %
Lymphs Abs: 0.7 10*3/uL (ref 0.7–4.0)
MCH: 36.1 pg — ABNORMAL HIGH (ref 26.0–34.0)
MCHC: 35.9 g/dL (ref 30.0–36.0)
MCV: 100.6 fL — ABNORMAL HIGH (ref 80.0–100.0)
Monocytes Absolute: 0.3 10*3/uL (ref 0.1–1.0)
Monocytes Relative: 13 %
Neutro Abs: 0.9 10*3/uL — ABNORMAL LOW (ref 1.7–7.7)
Neutrophils Relative %: 45 %
Platelet Count: 201 10*3/uL (ref 150–400)
RBC: 3.38 MIL/uL — ABNORMAL LOW (ref 3.87–5.11)
RDW: 12.2 % (ref 11.5–15.5)
WBC Count: 2 10*3/uL — ABNORMAL LOW (ref 4.0–10.5)
nRBC: 0 % (ref 0.0–0.2)

## 2021-12-03 LAB — CMP (CANCER CENTER ONLY)
ALT: 16 U/L (ref 0–44)
AST: 16 U/L (ref 15–41)
Albumin: 4.2 g/dL (ref 3.5–5.0)
Alkaline Phosphatase: 44 U/L (ref 38–126)
Anion gap: 7 (ref 5–15)
BUN: 14 mg/dL (ref 8–23)
CO2: 30 mmol/L (ref 22–32)
Calcium: 9.1 mg/dL (ref 8.9–10.3)
Chloride: 103 mmol/L (ref 98–111)
Creatinine: 0.85 mg/dL (ref 0.44–1.00)
GFR, Estimated: >60 mL/min (ref >60)
Glucose, Bld: 167 mg/dL — ABNORMAL HIGH (ref 70–99)
Potassium: 3.7 mmol/L (ref 3.5–5.1)
Sodium: 140 mmol/L (ref 135–145)
Total Bilirubin: 0.5 mg/dL (ref 0.3–1.2)
Total Protein: 6.8 g/dL (ref 6.5–8.1)

## 2021-12-03 MED ORDER — FULVESTRANT 250 MG/5ML IM SOSY
500.0000 mg | PREFILLED_SYRINGE | Freq: Once | INTRAMUSCULAR | Status: AC
  Administered 2021-12-03: 500 mg via INTRAMUSCULAR
  Filled 2021-12-03: qty 10

## 2021-12-03 MED ORDER — DENOSUMAB 120 MG/1.7ML ~~LOC~~ SOLN
120.0000 mg | Freq: Once | SUBCUTANEOUS | Status: AC
  Administered 2021-12-03: 120 mg via SUBCUTANEOUS
  Filled 2021-12-03: qty 1.7

## 2021-12-03 MED ORDER — FULVESTRANT 250 MG/5ML IM SOLN: 500.0000 mg | Freq: Once | INTRAMUSCULAR | Status: DC

## 2021-12-04 LAB — CANCER ANTIGEN 27.29: CA 27.29: 40.9 U/mL — ABNORMAL HIGH (ref 0.0–38.6)

## 2021-12-12 DIAGNOSIS — R739 Hyperglycemia, unspecified: Secondary | ICD-10-CM | POA: Diagnosis not present

## 2021-12-12 DIAGNOSIS — I700 Atherosclerosis of aorta: Secondary | ICD-10-CM | POA: Diagnosis not present

## 2021-12-12 DIAGNOSIS — I10 Essential (primary) hypertension: Secondary | ICD-10-CM | POA: Diagnosis not present

## 2021-12-12 DIAGNOSIS — E7800 Pure hypercholesterolemia, unspecified: Secondary | ICD-10-CM | POA: Diagnosis not present

## 2021-12-12 DIAGNOSIS — K760 Fatty (change of) liver, not elsewhere classified: Secondary | ICD-10-CM | POA: Diagnosis not present

## 2021-12-18 ENCOUNTER — Other Ambulatory Visit (HOSPITAL_COMMUNITY): Payer: Self-pay

## 2021-12-22 ENCOUNTER — Other Ambulatory Visit (HOSPITAL_COMMUNITY): Payer: Self-pay

## 2021-12-25 ENCOUNTER — Other Ambulatory Visit (HOSPITAL_COMMUNITY): Payer: Self-pay

## 2021-12-25 ENCOUNTER — Ambulatory Visit (HOSPITAL_COMMUNITY)
Admission: RE | Admit: 2021-12-25 | Discharge: 2021-12-25 | Disposition: A | Discharge status: Home / Self Care | Payer: BC Managed Care – PPO | Source: Ambulatory Visit | Attending: Physician Assistant | Admitting: Physician Assistant

## 2021-12-25 ENCOUNTER — Encounter (HOSPITAL_COMMUNITY)
Admission: RE | Admit: 2021-12-25 | Discharge: 2021-12-25 | Disposition: A | Discharge status: Home / Self Care | Payer: BC Managed Care – PPO | Source: Ambulatory Visit | Attending: Physician Assistant | Admitting: Physician Assistant

## 2021-12-25 ENCOUNTER — Telehealth: Payer: Self-pay | Admitting: Physician Assistant

## 2021-12-25 DIAGNOSIS — Z170 Estrogen receptor positive status [ER+]: Secondary | ICD-10-CM

## 2021-12-25 DIAGNOSIS — C7951 Secondary malignant neoplasm of bone: Secondary | ICD-10-CM | POA: Insufficient documentation

## 2021-12-25 DIAGNOSIS — M19071 Primary osteoarthritis, right ankle and foot: Secondary | ICD-10-CM | POA: Diagnosis not present

## 2021-12-25 DIAGNOSIS — I700 Atherosclerosis of aorta: Secondary | ICD-10-CM | POA: Diagnosis not present

## 2021-12-25 DIAGNOSIS — M439 Deforming dorsopathy, unspecified: Secondary | ICD-10-CM | POA: Diagnosis not present

## 2021-12-25 DIAGNOSIS — Z853 Personal history of malignant neoplasm of breast: Secondary | ICD-10-CM | POA: Diagnosis not present

## 2021-12-25 DIAGNOSIS — K439 Ventral hernia without obstruction or gangrene: Secondary | ICD-10-CM | POA: Diagnosis not present

## 2021-12-25 DIAGNOSIS — C50512 Malignant neoplasm of lower-outer quadrant of left female breast: Secondary | ICD-10-CM | POA: Diagnosis not present

## 2021-12-25 DIAGNOSIS — I3139 Other pericardial effusion (noninflammatory): Secondary | ICD-10-CM | POA: Diagnosis not present

## 2021-12-25 DIAGNOSIS — C50919 Malignant neoplasm of unspecified site of unspecified female breast: Secondary | ICD-10-CM | POA: Diagnosis not present

## 2021-12-25 DIAGNOSIS — K5730 Diverticulosis of large intestine without perforation or abscess without bleeding: Secondary | ICD-10-CM | POA: Diagnosis not present

## 2021-12-25 DIAGNOSIS — M419 Scoliosis, unspecified: Secondary | ICD-10-CM | POA: Diagnosis not present

## 2021-12-25 MED ORDER — IOHEXOL 300 MG/ML  SOLN
100.0000 mL | Freq: Once | INTRAMUSCULAR | Status: AC | PRN
  Administered 2021-12-25: 100 mL via INTRAVENOUS

## 2021-12-25 MED ORDER — TECHNETIUM TC 99M MEDRONATE IV KIT
20.0000 | PACK | Freq: Once | INTRAVENOUS | Status: AC | PRN
  Administered 2021-12-25: 20.9 via INTRAVENOUS

## 2021-12-25 MED ORDER — SODIUM CHLORIDE (PF) 0.9 % IJ SOLN
INTRAMUSCULAR | Status: AC
  Filled 2021-12-25: qty 50

## 2021-12-25 NOTE — Telephone Encounter (Signed)
I called the patient to let her know the CT looked stable. Bone scan pending but anticipating it to also look stable. Her follow up is not scheduled until next week. Discussed Dr. Burr Medico will go over it in more detail at her next appointment but overall reported to be stable. She expressed understanding and was appreciative for the call.  ?

## 2021-12-31 ENCOUNTER — Other Ambulatory Visit: Payer: BC Managed Care – PPO

## 2021-12-31 ENCOUNTER — Ambulatory Visit: Payer: BC Managed Care – PPO

## 2022-01-01 ENCOUNTER — Inpatient Hospital Stay (HOSPITAL_BASED_OUTPATIENT_CLINIC_OR_DEPARTMENT_OTHER): Payer: BC Managed Care – PPO | Admitting: Hematology

## 2022-01-01 ENCOUNTER — Inpatient Hospital Stay: Payer: BC Managed Care – PPO

## 2022-01-01 ENCOUNTER — Inpatient Hospital Stay: Payer: BC Managed Care – PPO | Attending: Oncology

## 2022-01-01 ENCOUNTER — Other Ambulatory Visit: Payer: Self-pay

## 2022-01-01 VITALS — BP 154/72 | HR 104 | Temp 98.4°F | Resp 18 | Ht 64.0 in | Wt 169.9 lb

## 2022-01-01 DIAGNOSIS — C7951 Secondary malignant neoplasm of bone: Secondary | ICD-10-CM | POA: Diagnosis not present

## 2022-01-01 DIAGNOSIS — C50512 Malignant neoplasm of lower-outer quadrant of left female breast: Secondary | ICD-10-CM

## 2022-01-01 DIAGNOSIS — Z5111 Encounter for antineoplastic chemotherapy: Secondary | ICD-10-CM | POA: Insufficient documentation

## 2022-01-01 DIAGNOSIS — Z923 Personal history of irradiation: Secondary | ICD-10-CM | POA: Insufficient documentation

## 2022-01-01 DIAGNOSIS — Z170 Estrogen receptor positive status [ER+]: Secondary | ICD-10-CM

## 2022-01-01 DIAGNOSIS — Z9012 Acquired absence of left breast and nipple: Secondary | ICD-10-CM | POA: Diagnosis not present

## 2022-01-01 DIAGNOSIS — Z79899 Other long term (current) drug therapy: Secondary | ICD-10-CM | POA: Diagnosis not present

## 2022-01-01 LAB — CMP (CANCER CENTER ONLY)
ALT: 20 U/L (ref 0–44)
AST: 17 U/L (ref 15–41)
Albumin: 4.3 g/dL (ref 3.5–5.0)
Alkaline Phosphatase: 44 U/L (ref 38–126)
Anion gap: 7 (ref 5–15)
BUN: 13 mg/dL (ref 8–23)
CO2: 31 mmol/L (ref 22–32)
Calcium: 9.6 mg/dL (ref 8.9–10.3)
Chloride: 103 mmol/L (ref 98–111)
Creatinine: 0.82 mg/dL (ref 0.44–1.00)
GFR, Estimated: >60 mL/min (ref >60)
Glucose, Bld: 124 mg/dL — ABNORMAL HIGH (ref 70–99)
Potassium: 3.7 mmol/L (ref 3.5–5.1)
Sodium: 141 mmol/L (ref 135–145)
Total Bilirubin: 0.4 mg/dL (ref 0.3–1.2)
Total Protein: 6.9 g/dL (ref 6.5–8.1)

## 2022-01-01 LAB — CBC WITH DIFFERENTIAL (CANCER CENTER ONLY)
Abs Immature Granulocytes: 0.01 10*3/uL (ref 0.00–0.07)
Basophils Absolute: 0.1 10*3/uL (ref 0.0–0.1)
Basophils Relative: 2 %
Eosinophils Absolute: 0 10*3/uL (ref 0.0–0.5)
Eosinophils Relative: 2 %
HCT: 35.4 % — ABNORMAL LOW (ref 36.0–46.0)
Hemoglobin: 12.5 g/dL (ref 12.0–15.0)
Immature Granulocytes: 0 %
Lymphocytes Relative: 31 %
Lymphs Abs: 0.8 10*3/uL (ref 0.7–4.0)
MCH: 35.8 pg — ABNORMAL HIGH (ref 26.0–34.0)
MCHC: 35.3 g/dL (ref 30.0–36.0)
MCV: 101.4 fL — ABNORMAL HIGH (ref 80.0–100.0)
Monocytes Absolute: 0.3 10*3/uL (ref 0.1–1.0)
Monocytes Relative: 13 %
Neutro Abs: 1.3 10*3/uL — ABNORMAL LOW (ref 1.7–7.7)
Neutrophils Relative %: 52 %
Platelet Count: 236 10*3/uL (ref 150–400)
RBC: 3.49 MIL/uL — ABNORMAL LOW (ref 3.87–5.11)
RDW: 12.6 % (ref 11.5–15.5)
WBC Count: 2.5 10*3/uL — ABNORMAL LOW (ref 4.0–10.5)
nRBC: 0 % (ref 0.0–0.2)

## 2022-01-01 MED ORDER — DENOSUMAB 120 MG/1.7ML ~~LOC~~ SOLN
120.0000 mg | Freq: Once | SUBCUTANEOUS | Status: AC
  Administered 2022-01-01: 120 mg via SUBCUTANEOUS
  Filled 2022-01-01: qty 1.7

## 2022-01-01 MED ORDER — FULVESTRANT 250 MG/5ML IM SOSY
500.0000 mg | PREFILLED_SYRINGE | Freq: Once | INTRAMUSCULAR | Status: AC
  Administered 2022-01-01: 500 mg via INTRAMUSCULAR
  Filled 2022-01-01: qty 10

## 2022-01-01 NOTE — Progress Notes (Addendum)
?[place]   ?[phone] [phone]   ?Clinic Follow up Note  ? ?Patient Care Team: ?Hamrick, Lorin Mercy, MD as PCP - General (Family Medicine) ?Rolm Bookbinder, MD as Consulting Physician (General Surgery) ?Magrinat, Virgie Dad, MD (Inactive) as Consulting Physician (Oncology) ?Mauro Kaufmann, RN as Registered Nurse ?Rockwell Germany, RN as Registered Nurse ?Dillingham, Loel Lofty, DO as Attending Physician (Plastic Surgery) ?Gery Pray, MD as Consulting Physician (Radiation Oncology) ?Brien Few, MD as Consulting Physician (Obstetrics and Gynecology) ?Raina Mina, RPH-CPP (Pharmacist) ? ?Date of Service:  01/01/2022 ? ?CHIEF COMPLAINT: f/u of metastatic breast cancer ? ?CURRENT THERAPY:  ?-denosumab and fulvestrant q4weeks, started 08/15/20 ?-palbociclib started 09/12/20, currently 75 mg daily 3 weeks on/1 week off since 12/2020. ? ?ASSESSMENT & PLAN:  ?Natasha Graham is a 61 y.o. post-hysterectomy female with  ? ?1. Metastatic Breast Cancer, lobular carcinoma, ER+/PR+/HER2-, HER2 IHC 2+ (low expression) ?-initially diagnosed 07/12/19 with stage IA c(T1, N0) invasive lobular carcinoma in left breast, ER+/PR+/Her2-. Left mastectomy on 09/27/19 showed stage IIIA p(T3, N1) ILC, SLND on 11/13/19 was negative (0/7). Mammaprint was low risk. ?-she received postmastectomy radiation 12/26/19 - 02/06/20 and took tamoxifen 02/29/20 - 07/2020 ?-genetics 06/27/20 were negative. ?-she was found to have bony metastatic disease in 07/2020, confirmed with BM biopsy 08/06/20. ?-she began Xgeva and fulvestrant on 08/15/20 and palbociclib on 09/12/20. She tolerates well overall. ?-most recent restaging CT CAP/bone scan on 12/25/21 showed stable diffuse sclerotic bone lesions without discernible uptake. I reviewed the results and images with her today.  ?-Continue fulvestrant injection and Ibrance, she has had a good response so far.  She is clinically doing well. ?-I discussed different  treatment options, should the fulvestrant no longer control her cancer.  I discussed the option of targeted therapy if she has PIK3CA or ESR mutation.  I discussed FoundationOne testing with her, as this could provide additional treatment options. For now, we will continue current therapy. ?-she is clinically doing well, tolerating treatment with no side effects. Labs reviewed, CBC stable, CMP WNL. ?  ?2. H/o right breast DCIS ?-diagnosed 10/2013, s/p right lumpectomy 02/26/14. Recurrent right breast DCIS found 02/2014, so she proceeded to mastectomy on 05/16/14. ?-she declined prophylactic antiestrogens at that time ?  ?3.  Mild neutropenia ?-Continue to [place], continue monitoring. ? ?  ?PLAN: ?-proceed with Xgeva and fulvestrant today and continue every 4 weeks ?-continue palbociclib, I refilled today ?-lab and injections monthly as scheduled ?-Follow-up in 3 months  ?-We will check with pathology to see if her previous biopsy is adequate for [facility] One genomic sequencing ? ? ?No problem-specific Assessment & Plan notes found for this encounter. ? ? ?SUMMARY OF ONCOLOGIC HISTORY: ?Oncology History  ?Breast neoplasm, Tis (DCIS), right (Resolved)  ?05/16/2014 Initial Diagnosis  ? Breast neoplasm, Tis (DCIS), right ?  ?06/27/2020 Genetic Testing  ? Negative genetic testing on the CancerNext-Expanded+RNAinsight panel testing.  The CancerNext-Expanded gene panel offered by [facility] and includes sequencing and rearrangement analysis for the following 77 genes: AIP, ALK, APC*, ATM*, AXIN2, BAP1, BARD1, BLM, BMPR1A, BRCA1*, BRCA2*, BRIP1*, CDC73, CDH1*, CDK4, CDKN1B, CDKN2A, CHEK2*, CTNNA1, DICER1, FANCC, FH, FLCN, GALNT12, KIF1B, LZTR1, MAX, MEN1, MET, MLH1*, MSH2*, MSH3, MSH6*, MUTYH*, NBN, NF1*, NF2, NTHL1, PALB2*, PHOX2B, PMS2*, POT1, PRKAR1A, PTCH1, PTEN*, RAD51C*, RAD51D*, RB1, RECQL, RET, SDHA, SDHAF2, SDHB, SDHC, SDHD, SMAD4, SMARCA4, SMARCB1, SMARCE1, STK11, SUFU, TMEM127, TP53*, TSC1, TSC2, VHL and XRCC2  (sequencing and deletion/duplication); EGFR, EGLN1, HOXB13, KIT, MITF, PDGFRA, POLD1,  and POLE (sequencing only); EPCAM and GREM1 (deletion/duplication only). DNA and RNA analyses performed for * genes. The report date is 06/27/2020. ?  ?Malignant neoplasm of lower-outer quadrant of left breast of female, estrogen receptor positive ([place])  ?10/18/2019 Initial Diagnosis  ? Malignant neoplasm of lower-outer quadrant of left breast of female, estrogen receptor positive ([place]) ? ?  ?08/15/2020 Cancer Staging  ? Staging form: Breast, AJCC 8th Edition ?- Clinical: Stage IIIA (cT3, cN1, cM0, G2, ER+, PR-, HER2-) - Signed by Chauncey Cruel, MD on 08/15/2020 ? ?  ?Malignant neoplasm metastatic to bone [facility])  ?08/05/2020 Initial Diagnosis  ? Bone metastases ([place]) ? ?  ?08/15/2020 Cancer Staging  ? Staging form: Bone - Appendicular Skeleton, Trunk, Skull, and Facial Bones, AJCC 8th Edition ?- Clinical: Stage IVB (cT3, cN1, cM1b) - Signed by Chauncey Cruel, MD on 08/15/2020 ? ?  ? ? ? ?INTERVAL HISTORY:  ?Natasha Graham is here for a follow up of metastatic breast cancer. She was last seen by PA Cassie on 11/06/21. She presents to the clinic alone. ?She reports she is doing well overall. She denies shortness of breath, except with exertion (such as carrying her 48 month old grandchild up the stairs). ?  ?All other systems were reviewed with the patient and are negative. ? ?MEDICAL HISTORY:  ?Past Medical History:  ?Diagnosis Date  ? Cancer [facility])   ? breast cancer left 2021 roght 2015  ? Family history of brain cancer   ? Family history of breast cancer   ? Family history of colon cancer   ? Family history of stomach cancer   ? History of radiation therapy last done February 06 2020  ? Hypertension   ? PMB (postmenopausal bleeding)   ? PONV (postoperative nausea and vomiting)   ? likes scopolamine patch  ? Scoliosis   ? Wears glasses   ? Wears glasses   ? ? ?SURGICAL HISTORY: ?Past Surgical History:  ?Procedure Laterality Date   ? AUGMENTATION MAMMAPLASTY Right   ? 2015 post mastectomy  ? AXILLARY LYMPH NODE DISSECTION Left 11/13/2019  ? Procedure: LEFT AXILLARY LYMPH NODE DISSECTION;  Surgeon: Rolm Bookbinder, MD;  Location: [place];  Service: General;  Laterality: Left;  ? BREAST IMPLANT EXCHANGE Right 03/19/2021  ? Procedure: exchange of right saline implant;  Surgeon: Wallace Going, DO;  Location: [place];  Service: Plastics;  Laterality: Right;  ? BREAST RECONSTRUCTION WITH PLACEMENT OF TISSUE EXPANDER AND FLEX HD (ACELLULAR HYDRATED DERMIS) Right 05/16/2014  ? Procedure: IMMEDIATE RIGHT BREAST RECONSTRUCTION WITH PLACEMENT OF TISSUE EXPANDER AND FLEX HD (ACELLULAR HYDRATED DERMIS);  Surgeon: Theodoro Kos, DO;  Location: [place];  Service: Plastics;  Laterality: Right;  ? BREAST RECONSTRUCTION WITH PLACEMENT OF TISSUE EXPANDER AND FLEX HD (ACELLULAR HYDRATED DERMIS) Left 09/27/2019  ? Procedure: LEFT BREAST RECONSTRUCTION WITH PLACEMENT OF TISSUE EXPANDER AND FLEX HD (ACELLULAR HYDRATED DERMIS);  Surgeon: Wallace Going, DO;  Location: [place];  Service: Plastics;  Laterality: Left;  ? BREAST SURGERY    ? right breast excisional biopsy  ? DILATATION & CURETTAGE/HYSTEROSCOPY WITH MYOSURE N/A 12/20/2020  ? Procedure: [place];  Surgeon: Brien Few, MD;  Location: [facility];  Service: Gynecology;  Laterality: N/A;  ? DILATION AND CURETTAGE OF UTERUS    ? HYSTEROSCOPY WITH D & C N/A 09/20/2015  ? Procedure: DILATATION AND CURETTAGE /HYSTEROSCOPY;  Surgeon: Brien Few, MD;  Location: [facility];  Service: Gynecology;  Laterality: N/A;  ? LIPOSUCTION Bilateral 08/08/2014  ? Procedure: LIPO SUCTION ;  Surgeon: Theodoro Kos, DO;  Location: [place];  Service: Plastics;  Laterality: Bilateral;  ? MASTECTOMY Right 05/16/2014  ? placement of acellular dermal matrix & tissue expanders   ? MASTOPEXY Left  08/08/2014  ? Procedure:  MASTOPEXY FOR SYMMETRY;  Surgeon: Theodoro Kos, DO;  Location: [place];  Service: Plastics;  Laterality: Left;  ? NIPPLE SPARING MASTECTOMY WITH SENTINEL LYMPH NODE BI

## 2022-01-02 LAB — CANCER ANTIGEN 27.29: CA 27.29: 39.6 U/mL — ABNORMAL HIGH (ref 0.0–38.6)

## 2022-01-03 ENCOUNTER — Encounter: Payer: Self-pay | Admitting: Hematology

## 2022-01-15 DIAGNOSIS — C50512 Malignant neoplasm of lower-outer quadrant of left female breast: Secondary | ICD-10-CM | POA: Diagnosis not present

## 2022-01-16 ENCOUNTER — Encounter (HOSPITAL_COMMUNITY): Payer: Self-pay | Admitting: Hematology

## 2022-01-21 ENCOUNTER — Other Ambulatory Visit (HOSPITAL_COMMUNITY): Payer: Self-pay

## 2022-01-27 ENCOUNTER — Other Ambulatory Visit (HOSPITAL_COMMUNITY): Payer: Self-pay

## 2022-01-28 ENCOUNTER — Other Ambulatory Visit: Payer: BC Managed Care – PPO

## 2022-01-28 ENCOUNTER — Ambulatory Visit: Payer: BC Managed Care – PPO

## 2022-01-28 ENCOUNTER — Other Ambulatory Visit: Payer: Self-pay

## 2022-01-28 ENCOUNTER — Inpatient Hospital Stay: Payer: BC Managed Care – PPO

## 2022-01-28 VITALS — BP 148/86 | HR 85 | Temp 98.6°F | Resp 18

## 2022-01-28 DIAGNOSIS — Z170 Estrogen receptor positive status [ER+]: Secondary | ICD-10-CM

## 2022-01-28 DIAGNOSIS — Z923 Personal history of irradiation: Secondary | ICD-10-CM | POA: Diagnosis not present

## 2022-01-28 DIAGNOSIS — C50512 Malignant neoplasm of lower-outer quadrant of left female breast: Secondary | ICD-10-CM | POA: Diagnosis not present

## 2022-01-28 DIAGNOSIS — Z79899 Other long term (current) drug therapy: Secondary | ICD-10-CM | POA: Diagnosis not present

## 2022-01-28 DIAGNOSIS — C7951 Secondary malignant neoplasm of bone: Secondary | ICD-10-CM | POA: Diagnosis not present

## 2022-01-28 DIAGNOSIS — Z9012 Acquired absence of left breast and nipple: Secondary | ICD-10-CM | POA: Diagnosis not present

## 2022-01-28 DIAGNOSIS — Z5111 Encounter for antineoplastic chemotherapy: Secondary | ICD-10-CM | POA: Diagnosis not present

## 2022-01-28 LAB — CMP (CANCER CENTER ONLY)
ALT: 18 U/L (ref 0–44)
AST: 16 U/L (ref 15–41)
Albumin: 4.2 g/dL (ref 3.5–5.0)
Alkaline Phosphatase: 42 U/L (ref 38–126)
Anion gap: 6 (ref 5–15)
BUN: 12 mg/dL (ref 8–23)
CO2: 32 mmol/L (ref 22–32)
Calcium: 9.5 mg/dL (ref 8.9–10.3)
Chloride: 102 mmol/L (ref 98–111)
Creatinine: 0.73 mg/dL (ref 0.44–1.00)
GFR, Estimated: >60 mL/min (ref >60)
Glucose, Bld: 113 mg/dL — ABNORMAL HIGH (ref 70–99)
Potassium: 3.8 mmol/L (ref 3.5–5.1)
Sodium: 140 mmol/L (ref 135–145)
Total Bilirubin: 0.4 mg/dL (ref 0.3–1.2)
Total Protein: 6.7 g/dL (ref 6.5–8.1)

## 2022-01-28 LAB — CBC WITH DIFFERENTIAL (CANCER CENTER ONLY)
Abs Immature Granulocytes: 0 10*3/uL (ref 0.00–0.07)
Basophils Absolute: 0 10*3/uL (ref 0.0–0.1)
Basophils Relative: 1 %
Eosinophils Absolute: 0 10*3/uL (ref 0.0–0.5)
Eosinophils Relative: 1 %
HCT: 33.1 % — ABNORMAL LOW (ref 36.0–46.0)
Hemoglobin: 12.1 g/dL (ref 12.0–15.0)
Immature Granulocytes: 0 %
Lymphocytes Relative: 38 %
Lymphs Abs: 0.9 10*3/uL (ref 0.7–4.0)
MCH: 36.6 pg — ABNORMAL HIGH (ref 26.0–34.0)
MCHC: 36.6 g/dL — ABNORMAL HIGH (ref 30.0–36.0)
MCV: 100 fL (ref 80.0–100.0)
Monocytes Absolute: 0.3 10*3/uL (ref 0.1–1.0)
Monocytes Relative: 14 %
Neutro Abs: 1 10*3/uL — ABNORMAL LOW (ref 1.7–7.7)
Neutrophils Relative %: 46 %
Platelet Count: 201 10*3/uL (ref 150–400)
RBC: 3.31 MIL/uL — ABNORMAL LOW (ref 3.87–5.11)
RDW: 12.5 % (ref 11.5–15.5)
WBC Count: 2.3 10*3/uL — ABNORMAL LOW (ref 4.0–10.5)
nRBC: 0 % (ref 0.0–0.2)

## 2022-01-28 MED ORDER — FULVESTRANT 250 MG/5ML IM SOSY
500.0000 mg | PREFILLED_SYRINGE | Freq: Once | INTRAMUSCULAR | Status: AC
  Administered 2022-01-28: 500 mg via INTRAMUSCULAR
  Filled 2022-01-28: qty 10

## 2022-01-28 MED ORDER — DENOSUMAB 120 MG/1.7ML ~~LOC~~ SOLN
120.0000 mg | Freq: Once | SUBCUTANEOUS | Status: AC
  Administered 2022-01-28: 120 mg via SUBCUTANEOUS
  Filled 2022-01-28: qty 1.7

## 2022-01-29 LAB — CANCER ANTIGEN 27.29: CA 27.29: 39.4 U/mL — ABNORMAL HIGH (ref 0.0–38.6)

## 2022-02-16 ENCOUNTER — Other Ambulatory Visit (HOSPITAL_COMMUNITY): Payer: Self-pay

## 2022-02-26 ENCOUNTER — Inpatient Hospital Stay: Payer: BC Managed Care – PPO | Attending: Oncology

## 2022-02-26 ENCOUNTER — Other Ambulatory Visit: Payer: Self-pay

## 2022-02-26 ENCOUNTER — Other Ambulatory Visit (HOSPITAL_COMMUNITY): Payer: Self-pay

## 2022-02-26 ENCOUNTER — Inpatient Hospital Stay: Payer: BC Managed Care – PPO

## 2022-02-26 VITALS — BP 158/82 | HR 82 | Temp 98.4°F | Resp 18

## 2022-02-26 DIAGNOSIS — C7951 Secondary malignant neoplasm of bone: Secondary | ICD-10-CM | POA: Diagnosis not present

## 2022-02-26 DIAGNOSIS — C50512 Malignant neoplasm of lower-outer quadrant of left female breast: Secondary | ICD-10-CM

## 2022-02-26 DIAGNOSIS — Z79899 Other long term (current) drug therapy: Secondary | ICD-10-CM | POA: Insufficient documentation

## 2022-02-26 LAB — CMP (CANCER CENTER ONLY)
ALT: 18 U/L (ref 0–44)
AST: 17 U/L (ref 15–41)
Albumin: 4.3 g/dL (ref 3.5–5.0)
Alkaline Phosphatase: 42 U/L (ref 38–126)
Anion gap: 6 (ref 5–15)
BUN: 13 mg/dL (ref 8–23)
CO2: 32 mmol/L (ref 22–32)
Calcium: 9.5 mg/dL (ref 8.9–10.3)
Chloride: 103 mmol/L (ref 98–111)
Creatinine: 0.77 mg/dL (ref 0.44–1.00)
GFR, Estimated: >60 mL/min (ref >60)
Glucose, Bld: 101 mg/dL — ABNORMAL HIGH (ref 70–99)
Potassium: 3.6 mmol/L (ref 3.5–5.1)
Sodium: 141 mmol/L (ref 135–145)
Total Bilirubin: 0.4 mg/dL (ref 0.3–1.2)
Total Protein: 6.7 g/dL (ref 6.5–8.1)

## 2022-02-26 LAB — CBC WITH DIFFERENTIAL (CANCER CENTER ONLY)
Abs Immature Granulocytes: 0.01 10*3/uL (ref 0.00–0.07)
Basophils Absolute: 0.1 10*3/uL (ref 0.0–0.1)
Basophils Relative: 2 %
Eosinophils Absolute: 0 10*3/uL (ref 0.0–0.5)
Eosinophils Relative: 2 %
HCT: 33.2 % — ABNORMAL LOW (ref 36.0–46.0)
Hemoglobin: 12.3 g/dL (ref 12.0–15.0)
Immature Granulocytes: 0 %
Lymphocytes Relative: 32 %
Lymphs Abs: 0.9 10*3/uL (ref 0.7–4.0)
MCH: 37.4 pg — ABNORMAL HIGH (ref 26.0–34.0)
MCHC: 37 g/dL — ABNORMAL HIGH (ref 30.0–36.0)
MCV: 100.9 fL — ABNORMAL HIGH (ref 80.0–100.0)
Monocytes Absolute: 0.4 10*3/uL (ref 0.1–1.0)
Monocytes Relative: 15 %
Neutro Abs: 1.3 10*3/uL — ABNORMAL LOW (ref 1.7–7.7)
Neutrophils Relative %: 49 %
Platelet Count: 213 10*3/uL (ref 150–400)
RBC: 3.29 MIL/uL — ABNORMAL LOW (ref 3.87–5.11)
RDW: 12.5 % (ref 11.5–15.5)
WBC Count: 2.7 10*3/uL — ABNORMAL LOW (ref 4.0–10.5)
nRBC: 0 % (ref 0.0–0.2)

## 2022-02-26 MED ORDER — DENOSUMAB 120 MG/1.7ML ~~LOC~~ SOLN
120.0000 mg | Freq: Once | SUBCUTANEOUS | Status: AC
  Administered 2022-02-26: 120 mg via SUBCUTANEOUS
  Filled 2022-02-26: qty 1.7

## 2022-02-26 MED ORDER — FULVESTRANT 250 MG/5ML IM SOSY
500.0000 mg | PREFILLED_SYRINGE | Freq: Once | INTRAMUSCULAR | Status: AC
  Administered 2022-02-26: 500 mg via INTRAMUSCULAR
  Filled 2022-02-26: qty 10

## 2022-02-27 LAB — CANCER ANTIGEN 27.29: CA 27.29: 44.2 U/mL — ABNORMAL HIGH (ref 0.0–38.6)

## 2022-03-16 ENCOUNTER — Other Ambulatory Visit (HOSPITAL_COMMUNITY): Payer: Self-pay

## 2022-03-17 ENCOUNTER — Other Ambulatory Visit: Payer: Self-pay | Admitting: Hematology

## 2022-03-17 ENCOUNTER — Other Ambulatory Visit (HOSPITAL_COMMUNITY): Payer: Self-pay

## 2022-03-17 MED ORDER — PALBOCICLIB 75 MG PO TABS
75.0000 mg | ORAL_TABLET | Freq: Every day | ORAL | 6 refills | Status: DC
  Filled 2022-03-17: qty 21, 28d supply, fill #0
  Filled 2022-04-13: qty 21, 28d supply, fill #1
  Filled 2022-05-12: qty 21, 28d supply, fill #2
  Filled 2022-06-09 – 2022-06-17 (×2): qty 21, 28d supply, fill #3
  Filled 2022-07-07: qty 21, 28d supply, fill #4
  Filled 2022-08-04 – 2022-08-07 (×2): qty 21, 28d supply, fill #5
  Filled 2022-09-01: qty 21, 28d supply, fill #6

## 2022-03-18 ENCOUNTER — Other Ambulatory Visit (HOSPITAL_COMMUNITY): Payer: Self-pay

## 2022-03-25 ENCOUNTER — Other Ambulatory Visit (HOSPITAL_COMMUNITY): Payer: Self-pay

## 2022-03-26 ENCOUNTER — Other Ambulatory Visit: Payer: Self-pay

## 2022-03-26 ENCOUNTER — Inpatient Hospital Stay: Payer: BC Managed Care – PPO | Attending: Oncology

## 2022-03-26 ENCOUNTER — Encounter: Payer: Self-pay | Admitting: Hematology

## 2022-03-26 ENCOUNTER — Inpatient Hospital Stay (HOSPITAL_BASED_OUTPATIENT_CLINIC_OR_DEPARTMENT_OTHER): Payer: BC Managed Care – PPO | Admitting: Hematology

## 2022-03-26 ENCOUNTER — Inpatient Hospital Stay: Payer: BC Managed Care – PPO

## 2022-03-26 VITALS — BP 151/80 | HR 102 | Temp 98.7°F | Resp 17 | Ht 64.0 in | Wt 170.3 lb

## 2022-03-26 DIAGNOSIS — C7951 Secondary malignant neoplasm of bone: Secondary | ICD-10-CM | POA: Insufficient documentation

## 2022-03-26 DIAGNOSIS — C50512 Malignant neoplasm of lower-outer quadrant of left female breast: Secondary | ICD-10-CM

## 2022-03-26 DIAGNOSIS — Z923 Personal history of irradiation: Secondary | ICD-10-CM | POA: Insufficient documentation

## 2022-03-26 DIAGNOSIS — Z79899 Other long term (current) drug therapy: Secondary | ICD-10-CM | POA: Diagnosis not present

## 2022-03-26 DIAGNOSIS — Z170 Estrogen receptor positive status [ER+]: Secondary | ICD-10-CM | POA: Diagnosis not present

## 2022-03-26 LAB — CMP (CANCER CENTER ONLY)
ALT: 20 U/L (ref 0–44)
AST: 18 U/L (ref 15–41)
Albumin: 4.4 g/dL (ref 3.5–5.0)
Alkaline Phosphatase: 42 U/L (ref 38–126)
Anion gap: 7 (ref 5–15)
BUN: 13 mg/dL (ref 8–23)
CO2: 32 mmol/L (ref 22–32)
Calcium: 9.7 mg/dL (ref 8.9–10.3)
Chloride: 103 mmol/L (ref 98–111)
Creatinine: 0.8 mg/dL (ref 0.44–1.00)
GFR, Estimated: >60 mL/min (ref >60)
Glucose, Bld: 106 mg/dL — ABNORMAL HIGH (ref 70–99)
Potassium: 3.6 mmol/L (ref 3.5–5.1)
Sodium: 142 mmol/L (ref 135–145)
Total Bilirubin: 0.5 mg/dL (ref 0.3–1.2)
Total Protein: 7.1 g/dL (ref 6.5–8.1)

## 2022-03-26 LAB — CBC WITH DIFFERENTIAL (CANCER CENTER ONLY)
Abs Immature Granulocytes: 0.01 10*3/uL (ref 0.00–0.07)
Basophils Absolute: 0.1 10*3/uL (ref 0.0–0.1)
Basophils Relative: 2 %
Eosinophils Absolute: 0 10*3/uL (ref 0.0–0.5)
Eosinophils Relative: 1 %
HCT: 35.5 % — ABNORMAL LOW (ref 36.0–46.0)
Hemoglobin: 13 g/dL (ref 12.0–15.0)
Immature Granulocytes: 0 %
Lymphocytes Relative: 31 %
Lymphs Abs: 0.9 10*3/uL (ref 0.7–4.0)
MCH: 37.1 pg — ABNORMAL HIGH (ref 26.0–34.0)
MCHC: 36.6 g/dL — ABNORMAL HIGH (ref 30.0–36.0)
MCV: 101.4 fL — ABNORMAL HIGH (ref 80.0–100.0)
Monocytes Absolute: 0.5 10*3/uL (ref 0.1–1.0)
Monocytes Relative: 17 %
Neutro Abs: 1.3 10*3/uL — ABNORMAL LOW (ref 1.7–7.7)
Neutrophils Relative %: 49 %
Platelet Count: 224 10*3/uL (ref 150–400)
RBC: 3.5 MIL/uL — ABNORMAL LOW (ref 3.87–5.11)
RDW: 12.5 % (ref 11.5–15.5)
WBC Count: 2.8 10*3/uL — ABNORMAL LOW (ref 4.0–10.5)
nRBC: 0 % (ref 0.0–0.2)

## 2022-03-26 MED ORDER — CLOTRIMAZOLE-BETAMETHASONE 1-0.05 % EX CREA: 1.0000 | TOPICAL_CREAM | Freq: Two times a day (BID) | CUTANEOUS | 0 refills | Status: DC

## 2022-03-26 MED ORDER — FULVESTRANT 250 MG/5ML IM SOSY
500.0000 mg | PREFILLED_SYRINGE | Freq: Once | INTRAMUSCULAR | Status: AC
  Administered 2022-03-26: 500 mg via INTRAMUSCULAR
  Filled 2022-03-26: qty 10

## 2022-03-26 MED ORDER — DENOSUMAB 120 MG/1.7ML ~~LOC~~ SOLN
120.0000 mg | Freq: Once | SUBCUTANEOUS | Status: AC
  Administered 2022-03-26: 120 mg via SUBCUTANEOUS
  Filled 2022-03-26: qty 1.7

## 2022-03-26 NOTE — Progress Notes (Signed)
Natasha Graham   [phone] [phone]   Clinic Follow up Note   Patient Care Team: Hamrick, Lorin Mercy, MD as PCP - General (Family Medicine) Rolm Bookbinder, MD as Consulting Physician (General Surgery) Mauro Kaufmann, RN as Registered Nurse Rockwell Germany, RN as Registered Nurse Gentry, Loel Lofty, DO as Attending Physician (Plastic Surgery) Gery Pray, MD as Consulting Physician (Radiation Oncology) Brien Few, MD as Consulting Physician (Obstetrics and Gynecology) Raina Mina, [place] (Pharmacist) Truitt Merle, MD as Consulting Physician (Medical Oncology)  Date of Service:  03/26/2022  CHIEF COMPLAINT: f/u of metastatic breast cancer  CURRENT THERAPY:  -denosumab and fulvestrant q4weeks, started 08/15/20 -palbociclib started 09/12/20, currently 75 mg daily 3 weeks on/1 week off since 12/2020.  ASSESSMENT & PLAN:  Natasha Graham is a 61 y.o. female with   1. Metastatic Breast Cancer to bones, lobular carcinoma, ER+/PR+, HER2 IHC 2+ (low expression) -initially diagnosed 07/12/19. S/p left mastectomy on 09/27/19 showed stage IIIA p(T3, N1) ILC, SLND on 11/13/19 was negative (0/7). Mammaprint was low risk. -she received postmastectomy radiation 12/26/19 - 02/06/20 and took tamoxifen 02/29/20 - 07/2020 -genetics 06/27/20 were negative. -she was found to have bony metastatic disease in 07/2020, confirmed with BM biopsy 08/06/20. -she began Xgeva and fulvestrant on 08/15/20 and palbociclib on 09/12/20. She tolerates well overall. -most recent restaging CT CAP/bone scan on 12/25/21 showed stable diffuse sclerotic bone lesions without discernible uptake. -[employer] testing obtained on her 07/2020 biopsy showed no targetable mutation. I reviewed with her -Continue fulvestrant injection and Ibrance, she has had a good response so far.  She is clinically doing well. Plan for repeat scans in 3 months -she is clinically doing well, tolerating  treatment with no side effects. Labs reviewed, CBC stable, CMP WNL. -She will continue monthly Xgeva injection.  Plan to change to every 3 months in 2024   2. H/o right breast DCIS -diagnosed 10/2013, s/p right lumpectomy 02/26/14. Recurrent right breast DCIS found 02/2014, so she proceeded to mastectomy on 05/16/14. -she declined prophylactic antiestrogens at that time   3.  Mild neutropenia -secondary to [place], continue monitoring.     PLAN: -proceed with Xgeva and fulvestrant today and continue every 4 weeks -continue palbociclib -lab and injections monthly as scheduled -Follow-up in 3 months  with bone scan    No problem-specific Assessment & Plan notes found for this encounter.   SUMMARY OF ONCOLOGIC HISTORY: Oncology History  Breast neoplasm, Tis (DCIS), right (Resolved)  05/16/2014 Initial Diagnosis   Breast neoplasm, Tis (DCIS), right   06/27/2020 Genetic Testing   Negative genetic testing on the CancerNext-Expanded+RNAinsight panel testing.  The CancerNext-Expanded gene panel offered by [facility] and includes sequencing and rearrangement analysis for the following 77 genes: AIP, ALK, APC*, ATM*, AXIN2, BAP1, BARD1, BLM, BMPR1A, BRCA1*, BRCA2*, BRIP1*, CDC73, CDH1*, CDK4, CDKN1B, CDKN2A, CHEK2*, CTNNA1, DICER1, FANCC, FH, FLCN, GALNT12, KIF1B, LZTR1, MAX, MEN1, MET, MLH1*, MSH2*, MSH3, MSH6*, MUTYH*, NBN, NF1*, NF2, NTHL1, PALB2*, PHOX2B, PMS2*, POT1, PRKAR1A, PTCH1, PTEN*, RAD51C*, RAD51D*, RB1, RECQL, RET, SDHA, SDHAF2, SDHB, SDHC, SDHD, SMAD4, SMARCA4, SMARCB1, SMARCE1, STK11, SUFU, TMEM127, TP53*, TSC1, TSC2, VHL and XRCC2 (sequencing and deletion/duplication); EGFR, EGLN1, HOXB13, KIT, MITF, PDGFRA, POLD1, and POLE (sequencing only); EPCAM and GREM1 (deletion/duplication only). DNA and RNA analyses performed for * genes. The report date is 06/27/2020.   Malignant neoplasm of lower-outer quadrant of left breast of female, estrogen receptor positive ([place])  10/18/2019  Initial Diagnosis   Malignant neoplasm of lower-outer  quadrant of left breast of female, estrogen receptor positive ([place])   08/15/2020 Cancer Staging   Staging form: Breast, AJCC 8th Edition - Clinical: Stage IIIA (cT3, cN1, cM0, G2, ER+, PR-, HER2-) - Signed by Chauncey Cruel, MD on 08/15/2020   Malignant neoplasm metastatic to bone ([place])  08/05/2020 Initial Diagnosis   Bone metastases ([place])   08/15/2020 Cancer Staging   Staging form: Bone - Appendicular Skeleton, Trunk, Skull, and Facial Bones, AJCC 8th Edition - Clinical: Stage IVB (cT3, cN1, cM1b) - Signed by Chauncey Cruel, MD on 08/15/2020      INTERVAL HISTORY:  Natasha Graham is here for a follow up of metastatic breast cancer. She was last seen by me on 01/01/22. She presents to the clinic alone. She reports she is doing well overall except for a new rash across her chest. She notes it's itchy and bothersome. She endorses trying cortisone cream without much relief.   All other systems were reviewed with the patient and are negative.  MEDICAL HISTORY:  Past Medical History:  Diagnosis Date   Cancer [facility])    breast cancer left 2021 roght 2015   Family history of brain cancer    Family history of breast cancer    Family history of colon cancer    Family history of stomach cancer    History of radiation therapy last done February 06 2020   Hypertension    PMB (postmenopausal bleeding)    PONV (postoperative nausea and vomiting)    likes scopolamine patch   Scoliosis    Wears glasses    Wears glasses     SURGICAL HISTORY: Past Surgical History:  Procedure Laterality Date   AUGMENTATION MAMMAPLASTY Right    2015 post mastectomy   AXILLARY LYMPH NODE DISSECTION Left 11/13/2019   Procedure: LEFT AXILLARY LYMPH NODE DISSECTION;  Surgeon: Rolm Bookbinder, MD;  Location: [place];  Service: General;  Laterality: Left;   BREAST IMPLANT EXCHANGE Right 03/19/2021   Procedure: exchange of right saline  implant;  Surgeon: Wallace Going, DO;  Location: [place];  Service: Plastics;  Laterality: Right;   BREAST RECONSTRUCTION WITH PLACEMENT OF TISSUE EXPANDER AND FLEX HD (ACELLULAR HYDRATED DERMIS) Right 05/16/2014   Procedure: IMMEDIATE RIGHT BREAST RECONSTRUCTION WITH PLACEMENT OF TISSUE EXPANDER AND FLEX HD (ACELLULAR HYDRATED DERMIS);  Surgeon: Theodoro Kos, DO;  Location: [place];  Service: Plastics;  Laterality: Right;   BREAST RECONSTRUCTION WITH PLACEMENT OF TISSUE EXPANDER AND FLEX HD (ACELLULAR HYDRATED DERMIS) Left 09/27/2019   Procedure: LEFT BREAST RECONSTRUCTION WITH PLACEMENT OF TISSUE EXPANDER AND FLEX HD (ACELLULAR HYDRATED DERMIS);  Surgeon: Wallace Going, DO;  Location: [place];  Service: Plastics;  Laterality: Left;   BREAST SURGERY     right breast excisional biopsy   DILATATION & CURETTAGE/HYSTEROSCOPY WITH MYOSURE N/A 12/20/2020   Procedure: [place];  Surgeon: Brien Few, MD;  Location: [place];  Service: Gynecology;  Laterality: N/A;   DILATION AND CURETTAGE OF UTERUS     HYSTEROSCOPY WITH D & C N/A 09/20/2015   Procedure: DILATATION AND CURETTAGE /HYSTEROSCOPY;  Surgeon: Brien Few, MD;  Location: [facility];  Service: Gynecology;  Laterality: N/A;   LIPOSUCTION Bilateral 08/08/2014   Procedure: LIPO SUCTION ;  Surgeon: Theodoro Kos, DO;  Location: [place];  Service: Plastics;  Laterality: Bilateral;   MASTECTOMY Right 05/16/2014   placement of acellular dermal matrix & tissue expanders  MASTOPEXY Left 08/08/2014   Procedure:  MASTOPEXY FOR SYMMETRY;  Surgeon: Theodoro Kos, DO;  Location: [place];  Service: Plastics;  Laterality: Left;   NIPPLE SPARING MASTECTOMY WITH SENTINEL LYMPH NODE BIOPSY Left 09/27/2019   Procedure: LEFT NIPPLE SPARING MASTECTOMY WITH LEFT AXILLARY SENTINEL LYMPH NODE BIOPSY;  Surgeon: Rolm Bookbinder,  MD;  Location: [place];  Service: General;  Laterality: Left;   REDUCTION MAMMAPLASTY Left    2015   REMOVAL OF TISSUE EXPANDER AND PLACEMENT OF IMPLANT Right 08/08/2014   Procedure: REMOVAL OF RIGHT  TISSUE EXPANDERS WITH PLACEMENT OF RIGHT BREAST IMPLANTS WITH LIPO SUCTION ;  Surgeon: Theodoro Kos, DO;  Location: [place];  Service: Plastics;  Laterality: Right;   REMOVAL OF TISSUE EXPANDER AND PLACEMENT OF IMPLANT Left 03/19/2021   Procedure: Removal of left expander for saline implant;  Surgeon: Wallace Going, DO;  Location: [place];  Service: Plastics;  Laterality: Left;  2 hours   ROBOTIC ASSISTED TOTAL HYSTERECTOMY WITH BILATERAL SALPINGO OOPHERECTOMY Bilateral 02/06/2021   Procedure: XI ROBOTIC ASSISTED TOTAL HYSTERECTOMY WITH BILATERAL SALPINGO OOPHORECTOMY, LYSIS of SIGMOID ADHESIONS MCCALL CUL DE PLASTY;  Surgeon: Brien Few, MD;  Location: [place];  Service: Gynecology;  Laterality: Bilateral;   SCAR REVISION Left 11/13/2019   Procedure: EXCISION OF LEFT MASTECTOMY SKIN;  Surgeon: Rolm Bookbinder, MD;  Location: [place];  Service: General;  Laterality: Left;   scoliosis  1972   harrington rods-age 9   TONSILLECTOMY  as child    I have reviewed the social history and family history with the patient and they are unchanged from previous note.  ALLERGIES:  has No Known Allergies.  MEDICATIONS:  Current Outpatient Medications  Medication Sig Dispense Refill   clotrimazole-betamethasone (LOTRISONE) cream Apply 1 Application topically 2 (two) times daily. 30 g 0   acetaminophen (TYLENOL) 500 MG tablet Take 500 mg by mouth.     Ascorbic Acid (VITAMIN C) 100 MG tablet Take 100 mg by mouth daily. 1000 mg     denosumab (XGEVA) 120 MG/1.7ML SOLN injection Inject 120 mg into the skin once. Monthly     Flaxseed Oil (LINSEED OIL) OIL by Misc.(Non-Drug; Combo Route) route.     Fulvestrant  (FASLODEX IM) Inject into the muscle. Shot every 4 weeks.     Ginger, Zingiber officinalis, (GINGER PO) Take by mouth.     ibuprofen (ADVIL) 200 MG tablet Take by mouth. Takes 2 of 200 mg     lisinopril-hydrochlorothiazide (ZESTORETIC) 20-25 MG tablet Take 1 tablet by mouth daily at 2 PM. Pt states lisinopril increased to 30 mg on 12-12-2020     Magnesium 250 MG TABS Take by mouth.     Multiple Vitamin (MULTIVITAMIN) capsule Take by mouth.     OVER THE COUNTER MEDICATION Calcium 1200mg -Take daily.     OVER THE COUNTER MEDICATION Trubiotics-Take daily.     palbociclib (IBRANCE) 75 MG tablet Take 1 tablet (75 mg total) by mouth daily. Take for 21 days on, 7 days off, repeat every 28 days. 21 tablet 6   potassium chloride SA (KLOR-CON M) 20 MEQ tablet Take 1 tablet (20 mEq total) by mouth daily. 4 tablet 0   Turmeric 500 MG CAPS Take by mouth.     No current facility-administered medications for this visit.    PHYSICAL EXAMINATION: ECOG PERFORMANCE STATUS: 1 - Symptomatic but completely ambulatory  Vitals:   03/26/22 0820  BP: (!) 151/80  Pulse: (!) 102  Resp: 17  Temp: 98.7 F (37.1 C)  SpO2: 98%   Wt Readings from Last 3 Encounters:  03/26/22 170 lb 4.8 oz (77.2 kg)  01/01/22 169 lb 14.4 oz (77.1 kg)  11/06/21 169 lb 6.4 oz (76.8 kg)     GENERAL:alert, no distress and comfortable SKIN: skin color normal, no significant lesions, (+) mild rash to chest EYES: normal, Conjunctiva are pink and non-injected, sclera clear  NEURO: alert & oriented x 3 with fluent speech  LABORATORY DATA:  I have reviewed the data as listed    Latest Ref Rng & Units 03/26/2022    7:57 AM 02/26/2022    8:13 AM 01/28/2022    9:03 AM  CBC  WBC 4.0 - 10.5 K/uL 2.8  2.7  2.3   Hemoglobin 12.0 - 15.0 g/dL 13.0  12.3  12.1   Hematocrit 36.0 - 46.0 % 35.5  33.2  33.1   Platelets 150 - 400 K/uL 224  213  201         Latest Ref Rng & Units 03/26/2022    7:57 AM 02/26/2022    8:13 AM 01/28/2022    9:03  AM  CMP  Glucose 70 - 99 mg/dL 106  101  113   BUN 8 - 23 mg/dL _0 Creatinine 0.44 - 1.00 mg/dL 0.80  0.77  0.73   Sodium 135 - 145 mmol/L 142  141  140   Potassium 3.5 - 5.1 mmol/L 3.6  3.6  3.8   Chloride 98 - 111 mmol/L 103  103  102   CO2 22 - 32 mmol/L 32  32  32   Calcium 8.9 - 10.3 mg/dL 9.7  9.5  9.5   Total Protein 6.5 - 8.1 g/dL 7.1  6.7  6.7   Total Bilirubin 0.3 - 1.2 mg/dL 0.5  0.4  0.4   Alkaline Phos 38 - 126 U/L 42  42  42   AST 15 - 41 U/L _1 ALT 0 - 44 U/L _2 RADIOGRAPHIC STUDIES: I have personally reviewed the radiological images as listed and agreed with the findings in the report. No results found.    No orders of the defined types were placed in this encounter.  All questions were answered. The patient knows to call the clinic with any problems, questions or concerns. No barriers to learning was detected. The total time spent in the appointment was 30 minutes.     Truitt Merle, MD 03/26/2022   I, Wilburn Mylar, am acting as scribe for Truitt Merle, MD.   I have reviewed the above documentation for accuracy and completeness, and I agree with the above.

## 2022-03-27 LAB — CANCER ANTIGEN 27.29: CA 27.29: 42.1 U/mL — ABNORMAL HIGH (ref 0.0–38.6)

## 2022-04-13 ENCOUNTER — Other Ambulatory Visit (HOSPITAL_COMMUNITY): Payer: Self-pay

## 2022-04-14 ENCOUNTER — Other Ambulatory Visit (HOSPITAL_COMMUNITY): Payer: Self-pay

## 2022-04-23 ENCOUNTER — Other Ambulatory Visit (HOSPITAL_COMMUNITY): Payer: Self-pay

## 2022-04-23 ENCOUNTER — Other Ambulatory Visit: Payer: Self-pay

## 2022-04-23 ENCOUNTER — Inpatient Hospital Stay: Payer: BC Managed Care – PPO | Attending: Oncology

## 2022-04-23 ENCOUNTER — Inpatient Hospital Stay: Payer: BC Managed Care – PPO

## 2022-04-23 VITALS — BP 168/82 | HR 85 | Temp 99.0°F | Resp 20

## 2022-04-23 DIAGNOSIS — C7951 Secondary malignant neoplasm of bone: Secondary | ICD-10-CM | POA: Diagnosis not present

## 2022-04-23 DIAGNOSIS — Z170 Estrogen receptor positive status [ER+]: Secondary | ICD-10-CM

## 2022-04-23 DIAGNOSIS — Z5111 Encounter for antineoplastic chemotherapy: Secondary | ICD-10-CM | POA: Insufficient documentation

## 2022-04-23 DIAGNOSIS — Z79899 Other long term (current) drug therapy: Secondary | ICD-10-CM | POA: Insufficient documentation

## 2022-04-23 DIAGNOSIS — C50512 Malignant neoplasm of lower-outer quadrant of left female breast: Secondary | ICD-10-CM | POA: Insufficient documentation

## 2022-04-23 LAB — CBC WITH DIFFERENTIAL (CANCER CENTER ONLY)
Abs Immature Granulocytes: 0.01 10*3/uL (ref 0.00–0.07)
Basophils Absolute: 0.1 10*3/uL (ref 0.0–0.1)
Basophils Relative: 2 %
Eosinophils Absolute: 0 10*3/uL (ref 0.0–0.5)
Eosinophils Relative: 2 %
HCT: 33.2 % — ABNORMAL LOW (ref 36.0–46.0)
Hemoglobin: 12.4 g/dL (ref 12.0–15.0)
Immature Granulocytes: 0 %
Lymphocytes Relative: 37 %
Lymphs Abs: 0.8 10*3/uL (ref 0.7–4.0)
MCH: 37.1 pg — ABNORMAL HIGH (ref 26.0–34.0)
MCHC: 37.3 g/dL — ABNORMAL HIGH (ref 30.0–36.0)
MCV: 99.4 fL (ref 80.0–100.0)
Monocytes Absolute: 0.3 10*3/uL (ref 0.1–1.0)
Monocytes Relative: 15 %
Neutro Abs: 1 10*3/uL — ABNORMAL LOW (ref 1.7–7.7)
Neutrophils Relative %: 44 %
Platelet Count: 216 10*3/uL (ref 150–400)
RBC: 3.34 MIL/uL — ABNORMAL LOW (ref 3.87–5.11)
RDW: 12.3 % (ref 11.5–15.5)
WBC Count: 2.3 10*3/uL — ABNORMAL LOW (ref 4.0–10.5)
nRBC: 0 % (ref 0.0–0.2)

## 2022-04-23 LAB — CMP (CANCER CENTER ONLY)
ALT: 21 U/L (ref 0–44)
AST: 21 U/L (ref 15–41)
Albumin: 4.3 g/dL (ref 3.5–5.0)
Alkaline Phosphatase: 43 U/L (ref 38–126)
Anion gap: 6 (ref 5–15)
BUN: 12 mg/dL (ref 8–23)
CO2: 31 mmol/L (ref 22–32)
Calcium: 9.7 mg/dL (ref 8.9–10.3)
Chloride: 103 mmol/L (ref 98–111)
Creatinine: 0.7 mg/dL (ref 0.44–1.00)
GFR, Estimated: >60 mL/min (ref >60)
Glucose, Bld: 113 mg/dL — ABNORMAL HIGH (ref 70–99)
Potassium: 3.7 mmol/L (ref 3.5–5.1)
Sodium: 140 mmol/L (ref 135–145)
Total Bilirubin: 0.4 mg/dL (ref 0.3–1.2)
Total Protein: 6.8 g/dL (ref 6.5–8.1)

## 2022-04-23 MED ORDER — DENOSUMAB 120 MG/1.7ML ~~LOC~~ SOLN
120.0000 mg | Freq: Once | SUBCUTANEOUS | Status: AC
  Administered 2022-04-23: 120 mg via SUBCUTANEOUS
  Filled 2022-04-23: qty 1.7

## 2022-04-23 MED ORDER — FULVESTRANT 250 MG/5ML IM SOLN: 500.0000 mg | Freq: Once | INTRAMUSCULAR | Status: DC

## 2022-04-23 MED ORDER — FULVESTRANT 250 MG/5ML IM SOSY
500.0000 mg | PREFILLED_SYRINGE | Freq: Once | INTRAMUSCULAR | Status: AC
  Administered 2022-04-23: 500 mg via INTRAMUSCULAR
  Filled 2022-04-23: qty 10

## 2022-04-24 LAB — CANCER ANTIGEN 27.29: CA 27.29: 38.3 U/mL (ref 0.0–38.6)

## 2022-05-12 ENCOUNTER — Other Ambulatory Visit (HOSPITAL_COMMUNITY): Payer: Self-pay

## 2022-05-21 ENCOUNTER — Inpatient Hospital Stay: Payer: BC Managed Care – PPO | Attending: Oncology

## 2022-05-21 ENCOUNTER — Inpatient Hospital Stay: Payer: BC Managed Care – PPO

## 2022-05-21 ENCOUNTER — Other Ambulatory Visit: Payer: Self-pay

## 2022-05-21 ENCOUNTER — Other Ambulatory Visit (HOSPITAL_COMMUNITY): Payer: Self-pay

## 2022-05-21 VITALS — BP 166/87 | HR 81 | Temp 98.8°F | Resp 20

## 2022-05-21 DIAGNOSIS — Z170 Estrogen receptor positive status [ER+]: Secondary | ICD-10-CM | POA: Diagnosis not present

## 2022-05-21 DIAGNOSIS — C7951 Secondary malignant neoplasm of bone: Secondary | ICD-10-CM

## 2022-05-21 DIAGNOSIS — Z923 Personal history of irradiation: Secondary | ICD-10-CM | POA: Diagnosis not present

## 2022-05-21 DIAGNOSIS — C50512 Malignant neoplasm of lower-outer quadrant of left female breast: Secondary | ICD-10-CM | POA: Insufficient documentation

## 2022-05-21 DIAGNOSIS — Z79899 Other long term (current) drug therapy: Secondary | ICD-10-CM | POA: Diagnosis not present

## 2022-05-21 LAB — CBC WITH DIFFERENTIAL (CANCER CENTER ONLY)
Abs Immature Granulocytes: 0.02 10*3/uL (ref 0.00–0.07)
Basophils Absolute: 0.1 10*3/uL (ref 0.0–0.1)
Basophils Relative: 2 %
Eosinophils Absolute: 0 10*3/uL (ref 0.0–0.5)
Eosinophils Relative: 1 %
HCT: 34.6 % — ABNORMAL LOW (ref 36.0–46.0)
Hemoglobin: 12.6 g/dL (ref 12.0–15.0)
Immature Granulocytes: 1 %
Lymphocytes Relative: 34 %
Lymphs Abs: 0.8 10*3/uL (ref 0.7–4.0)
MCH: 36.6 pg — ABNORMAL HIGH (ref 26.0–34.0)
MCHC: 36.4 g/dL — ABNORMAL HIGH (ref 30.0–36.0)
MCV: 100.6 fL — ABNORMAL HIGH (ref 80.0–100.0)
Monocytes Absolute: 0.2 10*3/uL (ref 0.1–1.0)
Monocytes Relative: 11 %
Neutro Abs: 1.1 10*3/uL — ABNORMAL LOW (ref 1.7–7.7)
Neutrophils Relative %: 51 %
Platelet Count: 224 10*3/uL (ref 150–400)
RBC: 3.44 MIL/uL — ABNORMAL LOW (ref 3.87–5.11)
RDW: 12.4 % (ref 11.5–15.5)
WBC Count: 2.2 10*3/uL — ABNORMAL LOW (ref 4.0–10.5)
nRBC: 0 % (ref 0.0–0.2)

## 2022-05-21 LAB — CMP (CANCER CENTER ONLY)
ALT: 24 U/L (ref 0–44)
AST: 22 U/L (ref 15–41)
Albumin: 4.4 g/dL (ref 3.5–5.0)
Alkaline Phosphatase: 43 U/L (ref 38–126)
Anion gap: 5 (ref 5–15)
BUN: 11 mg/dL (ref 8–23)
CO2: 32 mmol/L (ref 22–32)
Calcium: 9.3 mg/dL (ref 8.9–10.3)
Chloride: 103 mmol/L (ref 98–111)
Creatinine: 0.8 mg/dL (ref 0.44–1.00)
GFR, Estimated: >60 mL/min (ref >60)
Glucose, Bld: 119 mg/dL — ABNORMAL HIGH (ref 70–99)
Potassium: 3.5 mmol/L (ref 3.5–5.1)
Sodium: 140 mmol/L (ref 135–145)
Total Bilirubin: 0.5 mg/dL (ref 0.3–1.2)
Total Protein: 6.8 g/dL (ref 6.5–8.1)

## 2022-05-21 MED ORDER — DENOSUMAB 120 MG/1.7ML ~~LOC~~ SOLN
120.0000 mg | Freq: Once | SUBCUTANEOUS | Status: AC
  Administered 2022-05-21: 120 mg via SUBCUTANEOUS
  Filled 2022-05-21: qty 1.7

## 2022-05-21 MED ORDER — FULVESTRANT 250 MG/5ML IM SOSY
500.0000 mg | PREFILLED_SYRINGE | Freq: Once | INTRAMUSCULAR | Status: AC
  Administered 2022-05-21: 500 mg via INTRAMUSCULAR
  Filled 2022-05-21: qty 10

## 2022-05-21 MED ORDER — FULVESTRANT 250 MG/5ML IM SOSY
500.0000 mg | PREFILLED_SYRINGE | Freq: Once | INTRAMUSCULAR | Status: DC
  Filled 2022-05-21: qty 10

## 2022-05-22 LAB — CANCER ANTIGEN 27.29: CA 27.29: 34.2 U/mL (ref 0.0–38.6)

## 2022-06-09 ENCOUNTER — Other Ambulatory Visit (HOSPITAL_COMMUNITY): Payer: Self-pay

## 2022-06-10 ENCOUNTER — Telehealth: Payer: Self-pay | Admitting: Hematology

## 2022-06-10 ENCOUNTER — Other Ambulatory Visit (HOSPITAL_COMMUNITY): Payer: Self-pay

## 2022-06-10 NOTE — Telephone Encounter (Signed)
Rescheduled upcoming appointment due to provider's PAL. Patient is aware of changes. ?

## 2022-06-16 ENCOUNTER — Other Ambulatory Visit (HOSPITAL_COMMUNITY): Payer: Self-pay

## 2022-06-17 ENCOUNTER — Inpatient Hospital Stay (HOSPITAL_BASED_OUTPATIENT_CLINIC_OR_DEPARTMENT_OTHER): Payer: BC Managed Care – PPO | Admitting: Physician Assistant

## 2022-06-17 ENCOUNTER — Encounter: Payer: Self-pay | Admitting: Hematology

## 2022-06-17 ENCOUNTER — Other Ambulatory Visit: Payer: Self-pay

## 2022-06-17 ENCOUNTER — Other Ambulatory Visit (HOSPITAL_COMMUNITY): Payer: Self-pay

## 2022-06-17 ENCOUNTER — Inpatient Hospital Stay: Payer: BC Managed Care – PPO

## 2022-06-17 ENCOUNTER — Inpatient Hospital Stay: Payer: BC Managed Care – PPO | Attending: Oncology

## 2022-06-17 VITALS — BP 147/77 | HR 99 | Temp 97.9°F | Resp 18 | Ht 64.0 in | Wt 170.0 lb

## 2022-06-17 DIAGNOSIS — C7951 Secondary malignant neoplasm of bone: Secondary | ICD-10-CM | POA: Insufficient documentation

## 2022-06-17 DIAGNOSIS — Z853 Personal history of malignant neoplasm of breast: Secondary | ICD-10-CM | POA: Diagnosis not present

## 2022-06-17 DIAGNOSIS — Z170 Estrogen receptor positive status [ER+]: Secondary | ICD-10-CM | POA: Insufficient documentation

## 2022-06-17 DIAGNOSIS — C50512 Malignant neoplasm of lower-outer quadrant of left female breast: Secondary | ICD-10-CM | POA: Insufficient documentation

## 2022-06-17 DIAGNOSIS — Z9013 Acquired absence of bilateral breasts and nipples: Secondary | ICD-10-CM | POA: Insufficient documentation

## 2022-06-17 DIAGNOSIS — Z79899 Other long term (current) drug therapy: Secondary | ICD-10-CM | POA: Insufficient documentation

## 2022-06-17 LAB — CBC WITH DIFFERENTIAL (CANCER CENTER ONLY)
Abs Immature Granulocytes: 0.01 10*3/uL (ref 0.00–0.07)
Basophils Absolute: 0 10*3/uL (ref 0.0–0.1)
Basophils Relative: 2 %
Eosinophils Absolute: 0 10*3/uL (ref 0.0–0.5)
Eosinophils Relative: 1 %
HCT: 35.3 % — ABNORMAL LOW (ref 36.0–46.0)
Hemoglobin: 13 g/dL (ref 12.0–15.0)
Immature Granulocytes: 0 %
Lymphocytes Relative: 34 %
Lymphs Abs: 0.8 10*3/uL (ref 0.7–4.0)
MCH: 37.5 pg — ABNORMAL HIGH (ref 26.0–34.0)
MCHC: 36.8 g/dL — ABNORMAL HIGH (ref 30.0–36.0)
MCV: 101.7 fL — ABNORMAL HIGH (ref 80.0–100.0)
Monocytes Absolute: 0.3 10*3/uL (ref 0.1–1.0)
Monocytes Relative: 12 %
Neutro Abs: 1.2 10*3/uL — ABNORMAL LOW (ref 1.7–7.7)
Neutrophils Relative %: 51 %
Platelet Count: 221 10*3/uL (ref 150–400)
RBC: 3.47 MIL/uL — ABNORMAL LOW (ref 3.87–5.11)
RDW: 12.3 % (ref 11.5–15.5)
WBC Count: 2.4 10*3/uL — ABNORMAL LOW (ref 4.0–10.5)
nRBC: 0 % (ref 0.0–0.2)

## 2022-06-17 LAB — CMP (CANCER CENTER ONLY)
ALT: 19 U/L (ref 0–44)
AST: 18 U/L (ref 15–41)
Albumin: 4.4 g/dL (ref 3.5–5.0)
Alkaline Phosphatase: 45 U/L (ref 38–126)
Anion gap: 5 (ref 5–15)
BUN: 10 mg/dL (ref 8–23)
CO2: 32 mmol/L (ref 22–32)
Calcium: 9.6 mg/dL (ref 8.9–10.3)
Chloride: 104 mmol/L (ref 98–111)
Creatinine: 0.77 mg/dL (ref 0.44–1.00)
GFR, Estimated: >60 mL/min (ref >60)
Glucose, Bld: 97 mg/dL (ref 70–99)
Potassium: 3.7 mmol/L (ref 3.5–5.1)
Sodium: 141 mmol/L (ref 135–145)
Total Bilirubin: 0.5 mg/dL (ref 0.3–1.2)
Total Protein: 7 g/dL (ref 6.5–8.1)

## 2022-06-17 MED ORDER — FULVESTRANT 250 MG/5ML IM SOSY
500.0000 mg | PREFILLED_SYRINGE | Freq: Once | INTRAMUSCULAR | Status: AC
  Administered 2022-06-17: 500 mg via INTRAMUSCULAR
  Filled 2022-06-17: qty 10

## 2022-06-17 MED ORDER — DENOSUMAB 120 MG/1.7ML ~~LOC~~ SOLN
120.0000 mg | Freq: Once | SUBCUTANEOUS | Status: AC
  Administered 2022-06-17: 120 mg via SUBCUTANEOUS
  Filled 2022-06-17: qty 1.7

## 2022-06-17 NOTE — Progress Notes (Signed)
[place]   [phone] [phone]   Clinic Follow up Note   Patient Care Team: Hamrick, Lorin Mercy, MD as PCP - General (Family Medicine) Rolm Bookbinder, MD as Consulting Physician (General Surgery) Mauro Kaufmann, RN as Registered Nurse Rockwell Germany, RN as Registered Nurse Tenkiller, Loel Lofty, DO as Attending Physician (Plastic Surgery) Gery Pray, MD as Consulting Physician (Radiation Oncology) Brien Few, MD as Consulting Physician (Obstetrics and Gynecology) Raina Mina, RPH-CPP (Pharmacist) Truitt Merle, MD as Consulting Physician (Medical Oncology)  Date of Service:  06/17/2022  CHIEF COMPLAINT: f/u of metastatic breast cancer  SUMMARY OF ONCOLOGIC HISTORY: Oncology History  Breast neoplasm, Tis (DCIS), right (Resolved)  05/16/2014 Initial Diagnosis   Breast neoplasm, Tis (DCIS), right   06/27/2020 Genetic Testing   Negative genetic testing on the CancerNext-Expanded+RNAinsight panel testing.  The CancerNext-Expanded gene panel offered by [facility] and includes sequencing and rearrangement analysis for the following 77 genes: AIP, ALK, APC*, ATM*, AXIN2, BAP1, BARD1, BLM, BMPR1A, BRCA1*, BRCA2*, BRIP1*, CDC73, CDH1*, CDK4, CDKN1B, CDKN2A, CHEK2*, CTNNA1, DICER1, FANCC, FH, FLCN, GALNT12, KIF1B, LZTR1, MAX, MEN1, MET, MLH1*, MSH2*, MSH3, MSH6*, MUTYH*, NBN, NF1*, NF2, NTHL1, PALB2*, PHOX2B, PMS2*, POT1, PRKAR1A, PTCH1, PTEN*, RAD51C*, RAD51D*, RB1, RECQL, RET, SDHA, SDHAF2, SDHB, SDHC, SDHD, SMAD4, SMARCA4, SMARCB1, SMARCE1, STK11, SUFU, TMEM127, TP53*, TSC1, TSC2, VHL and XRCC2 (sequencing and deletion/duplication); EGFR, EGLN1, HOXB13, KIT, MITF, PDGFRA, POLD1, and POLE (sequencing only); EPCAM and GREM1 (deletion/duplication only). DNA and RNA analyses performed for * genes. The report date is 06/27/2020.   Malignant neoplasm of lower-outer quadrant of left breast of female, estrogen receptor positive ([place])  10/18/2019  Initial Diagnosis   Malignant neoplasm of lower-outer quadrant of left breast of female, estrogen receptor positive ([place])   08/15/2020 Cancer Staging   Staging form: Breast, AJCC 8th Edition - Clinical: Stage IIIA (cT3, cN1, cM0, G2, ER+, PR-, HER2-) - Signed by Chauncey Cruel, MD on 08/15/2020   Malignant neoplasm metastatic to bone ([place])  08/05/2020 Initial Diagnosis   Bone metastases ([place])   08/15/2020 Cancer Staging   Staging form: Bone - Appendicular Skeleton, Trunk, Skull, and Facial Bones, AJCC 8th Edition - Clinical: Stage IVB (cT3, cN1, cM1b) - Signed by Chauncey Cruel, MD on 08/15/2020     CURRENT THERAPY:  -denosumab and fulvestrant q4weeks, started 08/15/20 -palbociclib started 09/12/20, currently 75 mg daily 3 weeks on/1 week off since 12/2020.   INTERVAL HISTORY:  [name] is here for a follow up of metastatic breast cancer. She was last seen by Dr. Burr Medico on 03/26/22. She presents to the clinic alone.  [name] reports her energy levels have improved since starting to take co-Q10 supplementation.  She takes care of her 2 grandchildren throughout the day.  She has a good appetite and denies any recent weight loss.  She denies nausea, vomiting or abdominal pain.  Her bowel habits are unchanged without any recurrent episodes of diarrhea or constipation.  She reports her joint pain is slightly improved starting Osteo Bi-Flex supplementation.  She denies easy bruising or signs of active bleeding.  She denies fevers, chills, night sweats, shortness of breath, chest pain or cough.  She has no other complaints.   All other systems were reviewed with the patient and are negative.  MEDICAL HISTORY:  Past Medical History:  Diagnosis Date   Cancer [facility])    breast cancer left 2021 roght 2015   Family history of brain cancer    Family  history of breast cancer    Family history of colon cancer    Family history of stomach cancer    History of radiation therapy  last done February 06 2020   Hypertension    PMB (postmenopausal bleeding)    PONV (postoperative nausea and vomiting)    likes scopolamine patch   Scoliosis    Wears glasses    Wears glasses     SURGICAL HISTORY: Past Surgical History:  Procedure Laterality Date   AUGMENTATION MAMMAPLASTY Right    2015 post mastectomy   AXILLARY LYMPH NODE DISSECTION Left 11/13/2019   Procedure: LEFT AXILLARY LYMPH NODE DISSECTION;  Surgeon: Rolm Bookbinder, MD;  Location: [place];  Service: General;  Laterality: Left;   BREAST IMPLANT EXCHANGE Right 03/19/2021   Procedure: exchange of right saline implant;  Surgeon: Wallace Going, DO;  Location: [place];  Service: Plastics;  Laterality: Right;   BREAST RECONSTRUCTION WITH PLACEMENT OF TISSUE EXPANDER AND FLEX HD (ACELLULAR HYDRATED DERMIS) Right 05/16/2014   Procedure: IMMEDIATE RIGHT BREAST RECONSTRUCTION WITH PLACEMENT OF TISSUE EXPANDER AND FLEX HD (ACELLULAR HYDRATED DERMIS);  Surgeon: Theodoro Kos, DO;  Location: [place];  Service: Plastics;  Laterality: Right;   BREAST RECONSTRUCTION WITH PLACEMENT OF TISSUE EXPANDER AND FLEX HD (ACELLULAR HYDRATED DERMIS) Left 09/27/2019   Procedure: LEFT BREAST RECONSTRUCTION WITH PLACEMENT OF TISSUE EXPANDER AND FLEX HD (ACELLULAR HYDRATED DERMIS);  Surgeon: Wallace Going, DO;  Location: [place];  Service: Plastics;  Laterality: Left;   BREAST SURGERY     right breast excisional biopsy   DILATATION & CURETTAGE/HYSTEROSCOPY WITH MYOSURE N/A 12/20/2020   Procedure: [place];  Surgeon: Brien Few, MD;  Location: [place];  Service: Gynecology;  Laterality: N/A;   DILATION AND CURETTAGE OF UTERUS     HYSTEROSCOPY WITH D & C N/A 09/20/2015   Procedure: DILATATION AND CURETTAGE /HYSTEROSCOPY;  Surgeon: Brien Few, MD;  Location: [facility];  Service: Gynecology;  Laterality: N/A;   LIPOSUCTION  Bilateral 08/08/2014   Procedure: LIPO SUCTION ;  Surgeon: Theodoro Kos, DO;  Location: [place];  Service: Plastics;  Laterality: Bilateral;   MASTECTOMY Right 05/16/2014   placement of acellular dermal matrix & tissue expanders    MASTOPEXY Left 08/08/2014   Procedure:  MASTOPEXY FOR SYMMETRY;  Surgeon: Theodoro Kos, DO;  Location: [place];  Service: Plastics;  Laterality: Left;   NIPPLE SPARING MASTECTOMY WITH SENTINEL LYMPH NODE BIOPSY Left 09/27/2019   Procedure: LEFT NIPPLE SPARING MASTECTOMY WITH LEFT AXILLARY SENTINEL LYMPH NODE BIOPSY;  Surgeon: Rolm Bookbinder, MD;  Location: [place];  Service: General;  Laterality: Left;   REDUCTION MAMMAPLASTY Left    2015   REMOVAL OF TISSUE EXPANDER AND PLACEMENT OF IMPLANT Right 08/08/2014   Procedure: REMOVAL OF RIGHT  TISSUE EXPANDERS WITH PLACEMENT OF RIGHT BREAST IMPLANTS WITH LIPO SUCTION ;  Surgeon: Theodoro Kos, DO;  Location: [place];  Service: Plastics;  Laterality: Right;   REMOVAL OF TISSUE EXPANDER AND PLACEMENT OF IMPLANT Left 03/19/2021   Procedure: Removal of left expander for saline implant;  Surgeon: Wallace Going, DO;  Location: [place];  Service: Plastics;  Laterality: Left;  2 hours   ROBOTIC ASSISTED TOTAL HYSTERECTOMY WITH BILATERAL SALPINGO OOPHERECTOMY Bilateral 02/06/2021   Procedure: XI ROBOTIC ASSISTED TOTAL HYSTERECTOMY WITH BILATERAL SALPINGO OOPHORECTOMY, LYSIS of SIGMOID ADHESIONS [place] CUL DE PLASTY;  Surgeon: Ronita Hipps,  Richard, MD;  Location: [facility];  Service: Gynecology;  Laterality: Bilateral;   SCAR REVISION Left 11/13/2019   Procedure: EXCISION OF LEFT MASTECTOMY SKIN;  Surgeon: Rolm Bookbinder, MD;  Location: [place];  Service: General;  Laterality: Left;   scoliosis  1972   harrington rods-age 3   TONSILLECTOMY  as child    I have reviewed the social history and family  history with the patient and they are unchanged from previous note.  ALLERGIES:  has No Known Allergies.  MEDICATIONS:  Current Outpatient Medications  Medication Sig Dispense Refill   acetaminophen (TYLENOL) 500 MG tablet Take 500 mg by mouth.     Ascorbic Acid (VITAMIN C) 1000 MG tablet Take 100 mg by mouth daily. 1000 mg     Boswellia-Glucosamine-Vit D (OSTEO BI-FLEX ONE PER DAY PO) Take by mouth.     Calcium Carb-Cholecalciferol (CALCIUM PLUS VITAMIN D3) 600-20 MG-MCG TABS Take by mouth 2 (two) times daily.     clotrimazole-betamethasone (LOTRISONE) cream Apply 1 Application topically 2 (two) times daily. 30 g 0   Coenzyme Q10 (COQ10) 200 MG CAPS Take 200 mg by mouth daily.     denosumab (XGEVA) 120 MG/1.7ML SOLN injection Inject 120 mg into the skin once. Monthly     Flaxseed Oil (LINSEED OIL) OIL 1,200 mg daily.     Fulvestrant (FASLODEX IM) Inject into the muscle. Shot every 4 weeks.     Ginger, Zingiber officinalis, (GINGER PO) Take 550 mg by mouth.     ibuprofen (ADVIL) 200 MG tablet Take by mouth. Takes 2 of 200 mg     LISINOPRIL-HYDROCHLOROTHIAZIDE PO Take 1 tablet by mouth daily at 2 PM. 30/25 mg     Magnesium 400 MG TABS Take 400 mg by mouth daily.     Multiple Vitamin (MULTIVITAMIN) capsule Take by mouth.     OVER THE COUNTER MEDICATION Calcium 1200mg -Take daily.     OVER THE COUNTER MEDICATION Trubiotics-Take daily.     palbociclib (IBRANCE) 75 MG tablet Take 1 tablet (75 mg total) by mouth daily. Take for 21 days on, 7 days off, repeat every 28 days. 21 tablet 6   potassium chloride SA (KLOR-CON M) 20 MEQ tablet Take 1 tablet (20 mEq total) by mouth daily. (Patient taking differently: Take 99 tablets by mouth 2 (two) times daily. OTC  99 mg) 4 tablet 0   Turmeric 500 MG CAPS Take 500 mg by mouth 2 (two) times daily.     No current facility-administered medications for this visit.    PHYSICAL EXAMINATION: ECOG PERFORMANCE STATUS: 1 - Symptomatic but completely  ambulatory  Vitals:   06/17/22 0843  BP: (!) 147/77  Pulse: 99  Resp: 18  Temp: 97.9 F (36.6 C)  SpO2: 100%   Wt Readings from Last 3 Encounters:  06/17/22 170 lb (77.1 kg)  03/26/22 170 lb 4.8 oz (77.2 kg)  01/01/22 169 lb 14.4 oz (77.1 kg)    Constitutional: Oriented to person, place, and time and well-developed, well-nourished, and in no distress.  HENT:  Head: Normocephalic and atraumatic.  Eyes: Conjunctivae are normal. Right eye exhibits no discharge. Left eye exhibits no discharge. No scleral icterus.  Neck: Normal range of motion. Neck supple.   Cardiovascular: Normal rate, regular rhythm, normal heart sounds and intact distal pulses.   Pulmonary/Chest: Effort normal and breath sounds normal. No respiratory distress. No wheezes. No rales.  Musculoskeletal: Normal range of motion. Exhibits no edema.  Lymphadenopathy: No cervical adenopathy.  Neurological: Alert and oriented to person, place, and time. Exhibits normal muscle tone. Gait normal. Coordination normal.  Skin: Skin is warm and dry. No rash noted. Not diaphoretic. No erythema. No pallor.  Psychiatric: Mood, memory and judgment normal.    LABORATORY DATA:  I have reviewed the data as listed    Latest Ref Rng & Units 06/17/2022    8:04 AM 05/21/2022    8:05 AM 04/23/2022    8:09 AM  CBC  WBC 4.0 - 10.5 K/uL 2.4  2.2  2.3   Hemoglobin 12.0 - 15.0 g/dL 13.0  12.6  12.4   Hematocrit 36.0 - 46.0 % 35.3  34.6  33.2   Platelets 150 - 400 K/uL 221  224  216         Latest Ref Rng & Units 06/17/2022    8:04 AM 05/21/2022    8:05 AM 04/23/2022    8:09 AM  CMP  Glucose 70 - 99 mg/dL 97  119  113   BUN 8 - 23 mg/dL _0 Creatinine 0.44 - 1.00 mg/dL 0.77  0.80  0.70   Sodium 135 - 145 mmol/L 141  140  140   Potassium 3.5 - 5.1 mmol/L 3.7  3.5  3.7   Chloride 98 - 111 mmol/L 104  103  103   CO2 22 - 32 mmol/L 32  32  31   Calcium 8.9 - 10.3 mg/dL 9.6  9.3  9.7   Total Protein 6.5 - 8.1 g/dL 7.0  6.8   6.8   Total Bilirubin 0.3 - 1.2 mg/dL 0.5  0.5  0.4   Alkaline Phos 38 - 126 U/L 45  43  43   AST 15 - 41 U/L _1 ALT 0 - 44 U/L _2 RADIOGRAPHIC STUDIES: I have personally reviewed the radiological images as listed and agreed with the findings in the report. No results found.   ASSESSMENT & PLAN:  Natasha Graham is a 61 y.o. female with   1. Metastatic Breast Cancer to bones, lobular carcinoma, ER+/PR+, HER2 IHC 2+ (low expression) -initially diagnosed 07/12/19. S/p left mastectomy on 09/27/19 showed stage IIIA p(T3, N1) ILC, SLND on 11/13/19 was negative (0/7). Mammaprint was low risk. -she received postmastectomy radiation 12/26/19 - 02/06/20 and took tamoxifen 02/29/20 - 07/2020 -genetics 06/27/20 were negative. -she was found to have bony metastatic disease in 07/2020, confirmed with BM biopsy 08/06/20. -she began Xgeva and fulvestrant on 08/15/20 and palbociclib on 09/12/20.  -most recent restaging CT CAP/bone scan on 12/25/21 showed stable diffuse sclerotic bone lesions without discernible uptake. -[employer] testing obtained on her 07/2020 biopsy showed no targetable mutation. I reviewed with her  2. H/o right breast DCIS -diagnosed 10/2013, s/p right lumpectomy 02/26/14. Recurrent right breast DCIS found 02/2014, so she proceeded to mastectomy on 05/16/14. -she declined prophylactic antiestrogens at that time   3.  Mild neutropenia -secondary to [place], continue monitoring.     PLAN: -Labs today reviewed and require no intervention. Neutropenia stable. Normal creatinine and LFTs. CA 27.29 pending.  -Patient is tolerating treatment without any prohibitive toxicities. -Proceed with Xgeva and fulvestrant today. Continue every 4 weeks -Continue palbociclib without any dose modifications -Schedule bone scan in the next couple of weeks.  -RTC in 3 months with labs and f/u visit with Dr. Burr Medico   All questions were answered. The patient knows to  call the  clinic with any problems, questions or concerns. No barriers to learning was detected.  I have spent a total of 30 minutes minutes of face-to-face and non-face-to-face time, preparing to see the patient, performing a medically appropriate examination, counseling and educating the patient, ordering tests/procedures,  documenting clinical information in the electronic health record,and care coordination.   Dede Query PA-C Dept of Hematology and [place] at [facility] Phone: [phone]

## 2022-06-18 ENCOUNTER — Ambulatory Visit: Payer: BC Managed Care – PPO

## 2022-06-18 ENCOUNTER — Telehealth: Payer: Self-pay | Admitting: Hematology

## 2022-06-18 ENCOUNTER — Ambulatory Visit: Payer: BC Managed Care – PPO | Admitting: Hematology

## 2022-06-18 ENCOUNTER — Other Ambulatory Visit: Payer: BC Managed Care – PPO

## 2022-06-18 NOTE — Telephone Encounter (Signed)
Per 10/18 los called and spoke to pt about appointment

## 2022-06-19 LAB — CANCER ANTIGEN 27.29: CA 27.29: 31.4 U/mL (ref 0.0–38.6)

## 2022-06-20 DIAGNOSIS — H5203 Hypermetropia, bilateral: Secondary | ICD-10-CM | POA: Diagnosis not present

## 2022-06-20 DIAGNOSIS — H43399 Other vitreous opacities, unspecified eye: Secondary | ICD-10-CM | POA: Diagnosis not present

## 2022-06-24 ENCOUNTER — Encounter (HOSPITAL_COMMUNITY): Payer: BC Managed Care – PPO

## 2022-06-26 DIAGNOSIS — I10 Essential (primary) hypertension: Secondary | ICD-10-CM | POA: Diagnosis not present

## 2022-06-26 DIAGNOSIS — I700 Atherosclerosis of aorta: Secondary | ICD-10-CM | POA: Diagnosis not present

## 2022-06-26 DIAGNOSIS — E7800 Pure hypercholesterolemia, unspecified: Secondary | ICD-10-CM | POA: Diagnosis not present

## 2022-06-26 DIAGNOSIS — K760 Fatty (change of) liver, not elsewhere classified: Secondary | ICD-10-CM | POA: Diagnosis not present

## 2022-07-03 DIAGNOSIS — D225 Melanocytic nevi of trunk: Secondary | ICD-10-CM | POA: Diagnosis not present

## 2022-07-03 DIAGNOSIS — D2272 Melanocytic nevi of left lower limb, including hip: Secondary | ICD-10-CM | POA: Diagnosis not present

## 2022-07-03 DIAGNOSIS — D2261 Melanocytic nevi of right upper limb, including shoulder: Secondary | ICD-10-CM | POA: Diagnosis not present

## 2022-07-03 DIAGNOSIS — D2262 Melanocytic nevi of left upper limb, including shoulder: Secondary | ICD-10-CM | POA: Diagnosis not present

## 2022-07-06 ENCOUNTER — Encounter (HOSPITAL_COMMUNITY)
Admission: RE | Admit: 2022-07-06 | Discharge: 2022-07-06 | Disposition: A | Discharge status: Home / Self Care | Payer: BC Managed Care – PPO | Source: Ambulatory Visit | Attending: Physician Assistant | Admitting: Physician Assistant

## 2022-07-06 DIAGNOSIS — C7951 Secondary malignant neoplasm of bone: Secondary | ICD-10-CM | POA: Diagnosis not present

## 2022-07-06 DIAGNOSIS — C50919 Malignant neoplasm of unspecified site of unspecified female breast: Secondary | ICD-10-CM | POA: Diagnosis not present

## 2022-07-06 MED ORDER — TECHNETIUM TC 99M MEDRONATE IV KIT
20.0000 | PACK | Freq: Once | INTRAVENOUS | Status: AC | PRN
  Administered 2022-07-06: 20.9 via INTRAVENOUS

## 2022-07-07 ENCOUNTER — Other Ambulatory Visit (HOSPITAL_COMMUNITY): Payer: Self-pay

## 2022-07-14 ENCOUNTER — Other Ambulatory Visit (HOSPITAL_COMMUNITY): Payer: Self-pay

## 2022-07-15 ENCOUNTER — Other Ambulatory Visit: Payer: Self-pay

## 2022-07-15 ENCOUNTER — Inpatient Hospital Stay: Payer: BC Managed Care – PPO | Attending: Oncology

## 2022-07-15 ENCOUNTER — Inpatient Hospital Stay: Payer: BC Managed Care – PPO

## 2022-07-15 VITALS — BP 153/78 | HR 83 | Temp 98.7°F | Resp 16

## 2022-07-15 DIAGNOSIS — Z79899 Other long term (current) drug therapy: Secondary | ICD-10-CM | POA: Diagnosis not present

## 2022-07-15 DIAGNOSIS — C7951 Secondary malignant neoplasm of bone: Secondary | ICD-10-CM | POA: Insufficient documentation

## 2022-07-15 DIAGNOSIS — Z853 Personal history of malignant neoplasm of breast: Secondary | ICD-10-CM | POA: Insufficient documentation

## 2022-07-15 DIAGNOSIS — Z170 Estrogen receptor positive status [ER+]: Secondary | ICD-10-CM | POA: Insufficient documentation

## 2022-07-15 DIAGNOSIS — C50512 Malignant neoplasm of lower-outer quadrant of left female breast: Secondary | ICD-10-CM

## 2022-07-15 DIAGNOSIS — Z9013 Acquired absence of bilateral breasts and nipples: Secondary | ICD-10-CM | POA: Diagnosis not present

## 2022-07-15 LAB — CMP (CANCER CENTER ONLY)
ALT: 23 U/L (ref 0–44)
AST: 23 U/L (ref 15–41)
Albumin: 4 g/dL (ref 3.5–5.0)
Alkaline Phosphatase: 41 U/L (ref 38–126)
Anion gap: 8 (ref 5–15)
BUN: 11 mg/dL (ref 8–23)
CO2: 28 mmol/L (ref 22–32)
Calcium: 9.2 mg/dL (ref 8.9–10.3)
Chloride: 104 mmol/L (ref 98–111)
Creatinine: 0.75 mg/dL (ref 0.44–1.00)
GFR, Estimated: >60 mL/min (ref >60)
Glucose, Bld: 105 mg/dL — ABNORMAL HIGH (ref 70–99)
Potassium: 3.6 mmol/L (ref 3.5–5.1)
Sodium: 140 mmol/L (ref 135–145)
Total Bilirubin: 0.4 mg/dL (ref 0.3–1.2)
Total Protein: 6.9 g/dL (ref 6.5–8.1)

## 2022-07-15 LAB — CBC WITH DIFFERENTIAL (CANCER CENTER ONLY)
Abs Immature Granulocytes: 0.01 10*3/uL (ref 0.00–0.07)
Basophils Absolute: 0 10*3/uL (ref 0.0–0.1)
Basophils Relative: 2 %
Eosinophils Absolute: 0 10*3/uL (ref 0.0–0.5)
Eosinophils Relative: 1 %
HCT: 33.5 % — ABNORMAL LOW (ref 36.0–46.0)
Hemoglobin: 12.3 g/dL (ref 12.0–15.0)
Immature Granulocytes: 0 %
Lymphocytes Relative: 38 %
Lymphs Abs: 0.9 10*3/uL (ref 0.7–4.0)
MCH: 37.7 pg — ABNORMAL HIGH (ref 26.0–34.0)
MCHC: 36.7 g/dL — ABNORMAL HIGH (ref 30.0–36.0)
MCV: 102.8 fL — ABNORMAL HIGH (ref 80.0–100.0)
Monocytes Absolute: 0.3 10*3/uL (ref 0.1–1.0)
Monocytes Relative: 14 %
Neutro Abs: 1 10*3/uL — ABNORMAL LOW (ref 1.7–7.7)
Neutrophils Relative %: 45 %
Platelet Count: 221 10*3/uL (ref 150–400)
RBC: 3.26 MIL/uL — ABNORMAL LOW (ref 3.87–5.11)
RDW: 12.4 % (ref 11.5–15.5)
WBC Count: 2.3 10*3/uL — ABNORMAL LOW (ref 4.0–10.5)
nRBC: 0 % (ref 0.0–0.2)

## 2022-07-15 MED ORDER — FULVESTRANT 250 MG/5ML IM SOSY
500.0000 mg | PREFILLED_SYRINGE | Freq: Once | INTRAMUSCULAR | Status: AC
  Administered 2022-07-15: 500 mg via INTRAMUSCULAR
  Filled 2022-07-15 (×2): qty 10

## 2022-07-15 MED ORDER — DENOSUMAB 120 MG/1.7ML ~~LOC~~ SOLN
120.0000 mg | Freq: Once | SUBCUTANEOUS | Status: AC
  Administered 2022-07-15: 120 mg via SUBCUTANEOUS
  Filled 2022-07-15: qty 1.7

## 2022-07-16 ENCOUNTER — Telehealth: Payer: Self-pay

## 2022-07-16 LAB — CANCER ANTIGEN 27.29: CA 27.29: 32.9 U/mL (ref 0.0–38.6)

## 2022-07-16 NOTE — Telephone Encounter (Signed)
Pt advised with VU 

## 2022-07-16 NOTE — Telephone Encounter (Signed)
-----   Message from Lincoln Brigham, PA-C sent at 07/16/2022 10:06 AM EST ----- Please notify patient that bone scan is stable without any new bone lesions identified.

## 2022-07-31 ENCOUNTER — Other Ambulatory Visit (HOSPITAL_COMMUNITY): Payer: Self-pay

## 2022-08-04 ENCOUNTER — Other Ambulatory Visit (HOSPITAL_COMMUNITY): Payer: Self-pay

## 2022-08-07 ENCOUNTER — Other Ambulatory Visit (HOSPITAL_COMMUNITY): Payer: Self-pay

## 2022-08-11 ENCOUNTER — Other Ambulatory Visit: Payer: Self-pay

## 2022-08-12 ENCOUNTER — Other Ambulatory Visit: Payer: Self-pay

## 2022-08-12 ENCOUNTER — Inpatient Hospital Stay: Payer: BC Managed Care – PPO

## 2022-08-12 ENCOUNTER — Inpatient Hospital Stay: Payer: BC Managed Care – PPO | Attending: Hematology

## 2022-08-12 VITALS — BP 160/84 | HR 88 | Temp 98.9°F | Resp 18

## 2022-08-12 DIAGNOSIS — C7951 Secondary malignant neoplasm of bone: Secondary | ICD-10-CM | POA: Diagnosis not present

## 2022-08-12 DIAGNOSIS — C50512 Malignant neoplasm of lower-outer quadrant of left female breast: Secondary | ICD-10-CM | POA: Insufficient documentation

## 2022-08-12 DIAGNOSIS — Z170 Estrogen receptor positive status [ER+]: Secondary | ICD-10-CM

## 2022-08-12 DIAGNOSIS — Z5111 Encounter for antineoplastic chemotherapy: Secondary | ICD-10-CM | POA: Diagnosis not present

## 2022-08-12 LAB — CBC WITH DIFFERENTIAL (CANCER CENTER ONLY)
Abs Immature Granulocytes: 0.01 10*3/uL (ref 0.00–0.07)
Basophils Absolute: 0 10*3/uL (ref 0.0–0.1)
Basophils Relative: 2 %
Eosinophils Absolute: 0 10*3/uL (ref 0.0–0.5)
Eosinophils Relative: 1 %
HCT: 35.8 % — ABNORMAL LOW (ref 36.0–46.0)
Hemoglobin: 12.9 g/dL (ref 12.0–15.0)
Immature Granulocytes: 0 %
Lymphocytes Relative: 30 %
Lymphs Abs: 0.7 10*3/uL (ref 0.7–4.0)
MCH: 37 pg — ABNORMAL HIGH (ref 26.0–34.0)
MCHC: 36 g/dL (ref 30.0–36.0)
MCV: 102.6 fL — ABNORMAL HIGH (ref 80.0–100.0)
Monocytes Absolute: 0.4 10*3/uL (ref 0.1–1.0)
Monocytes Relative: 17 %
Neutro Abs: 1.1 10*3/uL — ABNORMAL LOW (ref 1.7–7.7)
Neutrophils Relative %: 50 %
Platelet Count: 233 10*3/uL (ref 150–400)
RBC: 3.49 MIL/uL — ABNORMAL LOW (ref 3.87–5.11)
RDW: 12.2 % (ref 11.5–15.5)
WBC Count: 2.3 10*3/uL — ABNORMAL LOW (ref 4.0–10.5)
nRBC: 0 % (ref 0.0–0.2)

## 2022-08-12 LAB — CMP (CANCER CENTER ONLY)
ALT: 21 U/L (ref 0–44)
AST: 19 U/L (ref 15–41)
Albumin: 4.3 g/dL (ref 3.5–5.0)
Alkaline Phosphatase: 40 U/L (ref 38–126)
Anion gap: 8 (ref 5–15)
BUN: 11 mg/dL (ref 8–23)
CO2: 30 mmol/L (ref 22–32)
Calcium: 9.7 mg/dL (ref 8.9–10.3)
Chloride: 102 mmol/L (ref 98–111)
Creatinine: 0.75 mg/dL (ref 0.44–1.00)
GFR, Estimated: >60 mL/min (ref >60)
Glucose, Bld: 116 mg/dL — ABNORMAL HIGH (ref 70–99)
Potassium: 3.7 mmol/L (ref 3.5–5.1)
Sodium: 140 mmol/L (ref 135–145)
Total Bilirubin: 0.5 mg/dL (ref 0.3–1.2)
Total Protein: 6.6 g/dL (ref 6.5–8.1)

## 2022-08-12 MED ORDER — DENOSUMAB 120 MG/1.7ML ~~LOC~~ SOLN
120.0000 mg | Freq: Once | SUBCUTANEOUS | Status: AC
  Administered 2022-08-12: 120 mg via SUBCUTANEOUS
  Filled 2022-08-12: qty 1.7

## 2022-08-12 MED ORDER — FULVESTRANT 250 MG/5ML IM SOSY
500.0000 mg | PREFILLED_SYRINGE | Freq: Once | INTRAMUSCULAR | Status: AC
  Administered 2022-08-12: 500 mg via INTRAMUSCULAR
  Filled 2022-08-12: qty 10

## 2022-08-13 LAB — CANCER ANTIGEN 27.29: CA 27.29: 28.8 U/mL (ref 0.0–38.6)

## 2022-08-17 ENCOUNTER — Other Ambulatory Visit (HOSPITAL_COMMUNITY): Payer: Self-pay

## 2022-08-17 ENCOUNTER — Telehealth: Payer: Self-pay | Admitting: Pharmacy Technician

## 2022-08-17 NOTE — Telephone Encounter (Signed)
Oral Oncology Patient Advocate Encounter  Prior Authorization for Natasha Graham has been approved.    PA# [id number]  Effective dates: 08/17/22 through 08/16/23  Patient can continue to fill at [facility].    Natasha Graham, CPhT-Adv Oncology Pharmacy Patient [place] Direct Number: [phone]  Fax: [phone]

## 2022-08-17 NOTE — Telephone Encounter (Signed)
Oral Oncology Patient Advocate Encounter   Received notification that prior authorization for Natasha Graham is due for renewal.   PA submitted on 08/17/22 Key [id number] Status is pending     Natasha Graham, [place] Patient [place] Direct Number: [phone]  Fax: [phone]

## 2022-09-01 ENCOUNTER — Other Ambulatory Visit (HOSPITAL_COMMUNITY): Payer: Self-pay

## 2022-09-01 ENCOUNTER — Encounter: Payer: Self-pay | Admitting: Hematology

## 2022-09-07 ENCOUNTER — Other Ambulatory Visit (HOSPITAL_COMMUNITY): Payer: Self-pay

## 2022-09-08 ENCOUNTER — Other Ambulatory Visit (HOSPITAL_COMMUNITY): Payer: Self-pay

## 2022-09-08 ENCOUNTER — Telehealth: Payer: Self-pay | Admitting: Pharmacy Technician

## 2022-09-08 ENCOUNTER — Other Ambulatory Visit: Payer: Self-pay

## 2022-09-08 ENCOUNTER — Encounter: Payer: Self-pay | Admitting: Hematology

## 2022-09-08 DIAGNOSIS — C7951 Secondary malignant neoplasm of bone: Secondary | ICD-10-CM

## 2022-09-08 DIAGNOSIS — Z170 Estrogen receptor positive status [ER+]: Secondary | ICD-10-CM

## 2022-09-08 NOTE — Progress Notes (Signed)
[place]   Telephone:([phone] [phone]   Clinic Follow up Note   Patient Care Team: Hamrick, Lorin Mercy, MD as PCP - General (Family Medicine) Rolm Bookbinder, MD as Consulting Physician (General Surgery) Mauro Kaufmann, RN as Registered Nurse Rockwell Germany, RN as Registered Nurse [place], Loel Lofty, DO as Attending Physician (Plastic Surgery) Gery Pray, MD as Consulting Physician (Radiation Oncology) Brien Few, MD as Consulting Physician (Obstetrics and Gynecology) Raina Mina, [place]on (Pharmacist) Truitt Merle, MD as Consulting Physician (Medical Oncology)  Date of Service:  09/08/2022  CHIEF COMPLAINT: f/u of metastatic breast cancer    CURRENT THERAPY:  Fulvestrant  ASSESSMENT: *** Natasha Graham is a 62 y.o. female with   No problem-specific Assessment & Plan notes found for this encounter.  ***   PLAN:  SUMMARY OF ONCOLOGIC HISTORY: Oncology History  Breast neoplasm, Tis (DCIS), right (Resolved)  05/16/2014 Initial Diagnosis   Breast neoplasm, Tis (DCIS), right   06/27/2020 Genetic Testing   Negative genetic testing on the CancerNext-Expanded+RNAinsight panel testing.  The CancerNext-Expanded gene panel offered by [facility] and includes sequencing and rearrangement analysis for the following 77 genes: AIP, ALK, APC*, ATM*, AXIN2, BAP1, BARD1, BLM, BMPR1A, BRCA1*, BRCA2*, BRIP1*, CDC73, CDH1*, CDK4, CDKN1B, CDKN2A, CHEK2*, CTNNA1, DICER1, FANCC, FH, FLCN, GALNT12, KIF1B, LZTR1, MAX, MEN1, MET, MLH1*, MSH2*, MSH3, MSH6*, MUTYH*, NBN, NF1*, NF2, NTHL1, PALB2*, PHOX2B, PMS2*, POT1, PRKAR1A, PTCH1, PTEN*, RAD51C*, RAD51D*, RB1, RECQL, RET, SDHA, SDHAF2, SDHB, SDHC, SDHD, SMAD4, SMARCA4, SMARCB1, SMARCE1, STK11, SUFU, TMEM127, TP53*, TSC1, TSC2, VHL and XRCC2 (sequencing and deletion/duplication); EGFR, EGLN1, HOXB13, KIT, MITF, PDGFRA, POLD1, and POLE (sequencing only); EPCAM and GREM1 (deletion/duplication only). DNA  and RNA analyses performed for * genes. The report date is 06/27/2020.   Malignant neoplasm of lower-outer quadrant of left breast of female, estrogen receptor positive ([place])  10/18/2019 Initial Diagnosis   Malignant neoplasm of lower-outer quadrant of left breast of female, estrogen receptor positive ([place])   08/15/2020 Cancer Staging   Staging form: Breast, AJCC 8th Edition - Clinical: Stage IIIA (cT3, cN1, cM0, G2, ER+, PR-, HER2-) - Signed by Chauncey Cruel, MD on 08/15/2020   Malignant neoplasm metastatic to bone ([place])  08/05/2020 Initial Diagnosis   Bone metastases ([place])   08/15/2020 Cancer Staging   Staging form: Bone - Appendicular Skeleton, Trunk, Skull, and Facial Bones, AJCC 8th Edition - Clinical: Stage IVB (cT3, cN1, cM1b) - Signed by Chauncey Cruel, MD on 08/15/2020      INTERVAL HISTORY: *** Natasha Graham is here for a follow up of  metastatic breast cancer  She was last seen by PA-C Dede Query on 06/17/22 She presents to the clinic      All other systems were reviewed with the patient and are negative.  MEDICAL HISTORY:  Past Medical History:  Diagnosis Date   Cancer [facility])    breast cancer left 2021 roght 2015   Family history of brain cancer    Family history of breast cancer    Family history of colon cancer    Family history of stomach cancer    History of radiation therapy last done February 06 2020   Hypertension    PMB (postmenopausal bleeding)    PONV (postoperative nausea and vomiting)    likes scopolamine patch   Scoliosis    Wears glasses    Wears glasses     SURGICAL HISTORY: Past Surgical History:  Procedure Laterality Date   AUGMENTATION MAMMAPLASTY Right  2015 post mastectomy   AXILLARY LYMPH NODE DISSECTION Left 11/13/2019   Procedure: LEFT AXILLARY LYMPH NODE DISSECTION;  Surgeon: Rolm Bookbinder, MD;  Location: [place];  Service: General;  Laterality: Left;   BREAST IMPLANT EXCHANGE Right 03/19/2021    Procedure: exchange of right saline implant;  Surgeon: Wallace Going, DO;  Location: [place];  Service: Plastics;  Laterality: Right;   BREAST RECONSTRUCTION WITH PLACEMENT OF TISSUE EXPANDER AND FLEX HD (ACELLULAR HYDRATED DERMIS) Right 05/16/2014   Procedure: IMMEDIATE RIGHT BREAST RECONSTRUCTION WITH PLACEMENT OF TISSUE EXPANDER AND FLEX HD (ACELLULAR HYDRATED DERMIS);  Surgeon: Theodoro Kos, DO;  Location: [place];  Service: Plastics;  Laterality: Right;   BREAST RECONSTRUCTION WITH PLACEMENT OF TISSUE EXPANDER AND FLEX HD (ACELLULAR HYDRATED DERMIS) Left 09/27/2019   Procedure: LEFT BREAST RECONSTRUCTION WITH PLACEMENT OF TISSUE EXPANDER AND FLEX HD (ACELLULAR HYDRATED DERMIS);  Surgeon: Wallace Going, DO;  Location: [place];  Service: Plastics;  Laterality: Left;   BREAST SURGERY     right breast excisional biopsy   DILATATION & CURETTAGE/HYSTEROSCOPY WITH MYOSURE N/A 12/20/2020   Procedure: [place];  Surgeon: Brien Few, MD;  Location: [place];  Service: Gynecology;  Laterality: N/A;   DILATION AND CURETTAGE OF UTERUS     HYSTEROSCOPY WITH D & C N/A 09/20/2015   Procedure: DILATATION AND CURETTAGE /HYSTEROSCOPY;  Surgeon: Brien Few, MD;  Location: [facility];  Service: Gynecology;  Laterality: N/A;   LIPOSUCTION Bilateral 08/08/2014   Procedure: LIPO SUCTION ;  Surgeon: Theodoro Kos, DO;  Location: [place];  Service: Plastics;  Laterality: Bilateral;   MASTECTOMY Right 05/16/2014   placement of acellular dermal matrix & tissue expanders    MASTOPEXY Left 08/08/2014   Procedure:  MASTOPEXY FOR SYMMETRY;  Surgeon: Theodoro Kos, DO;  Location: [place];  Service: Plastics;  Laterality: Left;   NIPPLE SPARING MASTECTOMY WITH SENTINEL LYMPH NODE BIOPSY Left 09/27/2019   Procedure: LEFT NIPPLE SPARING MASTECTOMY WITH LEFT AXILLARY SENTINEL LYMPH NODE  BIOPSY;  Surgeon: Rolm Bookbinder, MD;  Location: [place];  Service: General;  Laterality: Left;   REDUCTION MAMMAPLASTY Left    2015   REMOVAL OF TISSUE EXPANDER AND PLACEMENT OF IMPLANT Right 08/08/2014   Procedure: REMOVAL OF RIGHT  TISSUE EXPANDERS WITH PLACEMENT OF RIGHT BREAST IMPLANTS WITH LIPO SUCTION ;  Surgeon: Theodoro Kos, DO;  Location: [place];  Service: Plastics;  Laterality: Right;   REMOVAL OF TISSUE EXPANDER AND PLACEMENT OF IMPLANT Left 03/19/2021   Procedure: Removal of left expander for saline implant;  Surgeon: Wallace Going, DO;  Location: [place];  Service: Plastics;  Laterality: Left;  2 hours   ROBOTIC ASSISTED TOTAL HYSTERECTOMY WITH BILATERAL SALPINGO OOPHERECTOMY Bilateral 02/06/2021   Procedure: XI ROBOTIC ASSISTED TOTAL HYSTERECTOMY WITH BILATERAL SALPINGO OOPHORECTOMY, LYSIS of SIGMOID ADHESIONS MCCALL CUL DE PLASTY;  Surgeon: Brien Few, MD;  Location: [place];  Service: Gynecology;  Laterality: Bilateral;   SCAR REVISION Left 11/13/2019   Procedure: EXCISION OF LEFT MASTECTOMY SKIN;  Surgeon: Rolm Bookbinder, MD;  Location: [place];  Service: General;  Laterality: Left;   scoliosis  1972   harrington rods-age 35   TONSILLECTOMY  as child    I have reviewed the social history and family history with the patient and they are unchanged from previous note.  ALLERGIES:  has No Known  Allergies.  MEDICATIONS:  Current Outpatient Medications  Medication Sig Dispense Refill   acetaminophen (TYLENOL) 500 MG tablet Take 500 mg by mouth.     Ascorbic Acid (VITAMIN C) 1000 MG tablet Take 100 mg by mouth daily. 1000 mg     Boswellia-Glucosamine-Vit D (OSTEO BI-FLEX ONE PER DAY PO) Take by mouth.     Calcium Carb-Cholecalciferol (CALCIUM PLUS VITAMIN D3) 600-20 MG-MCG TABS Take by mouth 2 (two) times daily.     clotrimazole-betamethasone (LOTRISONE) cream Apply 1  Application topically 2 (two) times daily. 30 g 0   Coenzyme Q10 (COQ10) 200 MG CAPS Take 200 mg by mouth daily.     denosumab (XGEVA) 120 MG/1.7ML SOLN injection Inject 120 mg into the skin once. Monthly     Flaxseed Oil (LINSEED OIL) OIL 1,200 mg daily.     Fulvestrant (FASLODEX IM) Inject into the muscle. Shot every 4 weeks.     Ginger, Zingiber officinalis, (GINGER PO) Take 550 mg by mouth.     ibuprofen (ADVIL) 200 MG tablet Take by mouth. Takes 2 of 200 mg     LISINOPRIL-HYDROCHLOROTHIAZIDE PO Take 1 tablet by mouth daily at 2 PM. 30/25 mg     Magnesium 400 MG TABS Take 400 mg by mouth daily.     Multiple Vitamin (MULTIVITAMIN) capsule Take by mouth.     OVER THE COUNTER MEDICATION Calcium '1200mg'$ -Take daily.     OVER THE COUNTER MEDICATION Trubiotics-Take daily.     palbociclib (IBRANCE) 75 MG tablet Take 1 tablet (75 mg total) by mouth daily. Take for 21 days on, 7 days off, repeat every 28 days. 21 tablet 6   potassium chloride SA (KLOR-CON M) 20 MEQ tablet Take 1 tablet (20 mEq total) by mouth daily. (Patient taking differently: Take 99 tablets by mouth 2 (two) times daily. OTC  99 mg) 4 tablet 0   Turmeric 500 MG CAPS Take 500 mg by mouth 2 (two) times daily.     No current facility-administered medications for this visit.    PHYSICAL EXAMINATION: ECOG PERFORMANCE STATUS: {CHL ONC ECOG [phone]}  There were no vitals filed for this visit. Wt Readings from Last 3 Encounters:  06/17/22 170 lb (77.1 kg)  03/26/22 170 lb 4.8 oz (77.2 kg)  01/01/22 169 lb 14.4 oz (77.1 kg)    {Only keep what was examined. If exam not performed, can use .CEXAM } GENERAL:alert, no distress and comfortable SKIN: skin color, texture, turgor are normal, no rashes or significant lesions EYES: normal, Conjunctiva are pink and non-injected, sclera clear {OROPHARYNX:no exudate, no erythema and lips, buccal mucosa, and tongue normal}  NECK: supple, thyroid normal size, non-tender, without  nodularity LYMPH:  no palpable lymphadenopathy in the cervical, axillary {or inguinal} LUNGS: clear to auscultation and percussion with normal breathing effort HEART: regular rate & rhythm and no murmurs and no lower extremity edema ABDOMEN:abdomen soft, non-tender and normal bowel sounds Musculoskeletal:no cyanosis of digits and no clubbing  NEURO: alert & oriented x 3 with fluent speech, no focal motor/sensory deficits  LABORATORY DATA:  I have reviewed the data as listed    Latest Ref Rng & Units 08/12/2022    7:31 AM 07/15/2022    8:09 AM 06/17/2022    8:04 AM  CBC  WBC 4.0 - 10.5 K/uL 2.3  2.3  2.4   Hemoglobin 12.0 - 15.0 g/dL 12.9  12.3  13.0   Hematocrit 36.0 - 46.0 % 35.8  33.5  35.3   Platelets 150 -  400 K/uL 233  221  221         Latest Ref Rng & Units 08/12/2022    7:31 AM 07/15/2022    8:09 AM 06/17/2022    8:04 AM  CMP  Glucose 70 - 99 mg/dL 116  105  97   BUN 8 - 23 mg/dL '11  11  10   '$ Creatinine 0.44 - 1.00 mg/dL 0.75  0.75  0.77   Sodium 135 - 145 mmol/L 140  140  141   Potassium 3.5 - 5.1 mmol/L 3.7  3.6  3.7   Chloride 98 - 111 mmol/L 102  104  104   CO2 22 - 32 mmol/L 30  28  32   Calcium 8.9 - 10.3 mg/dL 9.7  9.2  9.6   Total Protein 6.5 - 8.1 g/dL 6.6  6.9  7.0   Total Bilirubin 0.3 - 1.2 mg/dL 0.5  0.4  0.5   Alkaline Phos 38 - 126 U/L 40  41  45   AST 15 - 41 U/L '19  23  18   '$ ALT 0 - 44 U/L '21  23  19       '$ RADIOGRAPHIC STUDIES: I have personally reviewed the radiological images as listed and agreed with the findings in the report. No results found.    No orders of the defined types were placed in this encounter.  All questions were answered. The patient knows to call the clinic with any problems, questions or concerns. No barriers to learning was detected. The total time spent in the appointment was {CHL ONC TIME VISIT - [id number]}.     Baldemar Friday, CMA 09/08/2022   I, Audry Riles, CMA, am acting as scribe for Truitt Merle, MD.   {Add scribe attestation statement}

## 2022-09-08 NOTE — Assessment & Plan Note (Addendum)
Metastatic Breast Cancer to bones, lobular carcinoma, ER+/PR+, HER2 IHC 2+ (low expression) -left breast cancer initially diagnosed 07/12/19. S/p left mastectomy on 09/27/19 showed stage IIIA p(T3, N1) ILC, SLND on 11/13/19 was negative (0/7). Mammaprint was low risk. -she received postmastectomy radiation 12/26/19 - 02/06/20 and took tamoxifen 02/29/20 - 07/2020 -genetics 06/27/20 were negative. -she was found to have bony metastatic disease in 07/2020, confirmed with bone biopsy 08/06/20. -she began Xgeva and fulvestrant on 08/15/20 and palbociclib on 09/12/20. She tolerates well overall. -[employer] testing obtained on her 07/2020 biopsy showed no targetable mutation.  -last bone scan in 07/2022 showed no hypermetabolic lesions  -Continue fulvestrant injection and Ibrance, she has had a good response so far.  She is clinically doing well. Plan for repeat scans every 6 months

## 2022-09-08 NOTE — Telephone Encounter (Signed)
Oral Oncology Patient Advocate Encounter   Received notification that prior authorization for Natasha Graham is required, medication is no longer formulary for patient's [employer] insurance as of 08/31/22   PA submitted on 09/08/22 Key [id number] Status is pending     [name], CPhT-Adv Oncology Pharmacy Patient [place] Direct Number: [phone]  Fax: [phone]

## 2022-09-08 NOTE — Telephone Encounter (Signed)
Oral Oncology Patient Advocate Encounter  Prior Authorization for Natasha Graham has been approved.    PA# [id number] Effective dates: 09/08/22 through 09/07/23  Patients co-pay is $9,434.99.    [name], CPhT-Adv Oncology Pharmacy Patient [place] Direct Number: [phone]  Fax: [phone]

## 2022-09-09 ENCOUNTER — Inpatient Hospital Stay (HOSPITAL_BASED_OUTPATIENT_CLINIC_OR_DEPARTMENT_OTHER): Payer: BC Managed Care – PPO | Admitting: Hematology

## 2022-09-09 ENCOUNTER — Inpatient Hospital Stay: Payer: BC Managed Care – PPO | Attending: Hematology

## 2022-09-09 ENCOUNTER — Other Ambulatory Visit: Payer: Self-pay

## 2022-09-09 ENCOUNTER — Other Ambulatory Visit (HOSPITAL_COMMUNITY): Payer: Self-pay

## 2022-09-09 ENCOUNTER — Encounter: Payer: Self-pay | Admitting: Hematology

## 2022-09-09 ENCOUNTER — Inpatient Hospital Stay: Payer: BC Managed Care – PPO

## 2022-09-09 VITALS — BP 154/75 | HR 98 | Temp 100.0°F | Resp 15 | Ht 64.0 in | Wt 171.0 lb

## 2022-09-09 DIAGNOSIS — C7951 Secondary malignant neoplasm of bone: Secondary | ICD-10-CM | POA: Diagnosis not present

## 2022-09-09 DIAGNOSIS — Z170 Estrogen receptor positive status [ER+]: Secondary | ICD-10-CM | POA: Insufficient documentation

## 2022-09-09 DIAGNOSIS — C50512 Malignant neoplasm of lower-outer quadrant of left female breast: Secondary | ICD-10-CM | POA: Diagnosis not present

## 2022-09-09 DIAGNOSIS — Z5111 Encounter for antineoplastic chemotherapy: Secondary | ICD-10-CM | POA: Insufficient documentation

## 2022-09-09 DIAGNOSIS — Z923 Personal history of irradiation: Secondary | ICD-10-CM | POA: Diagnosis not present

## 2022-09-09 DIAGNOSIS — Z79810 Long term (current) use of selective estrogen receptor modulators (SERMs): Secondary | ICD-10-CM | POA: Diagnosis not present

## 2022-09-09 LAB — CBC WITH DIFFERENTIAL (CANCER CENTER ONLY)
Abs Immature Granulocytes: 0 10*3/uL (ref 0.00–0.07)
Basophils Absolute: 0 10*3/uL (ref 0.0–0.1)
Basophils Relative: 1 %
Eosinophils Absolute: 0 10*3/uL (ref 0.0–0.5)
Eosinophils Relative: 1 %
HCT: 33 % — ABNORMAL LOW (ref 36.0–46.0)
Hemoglobin: 12 g/dL (ref 12.0–15.0)
Immature Granulocytes: 0 %
Lymphocytes Relative: 32 %
Lymphs Abs: 0.7 10*3/uL (ref 0.7–4.0)
MCH: 37 pg — ABNORMAL HIGH (ref 26.0–34.0)
MCHC: 36.4 g/dL — ABNORMAL HIGH (ref 30.0–36.0)
MCV: 101.9 fL — ABNORMAL HIGH (ref 80.0–100.0)
Monocytes Absolute: 0.2 10*3/uL (ref 0.1–1.0)
Monocytes Relative: 9 %
Neutro Abs: 1.3 10*3/uL — ABNORMAL LOW (ref 1.7–7.7)
Neutrophils Relative %: 57 %
Platelet Count: 222 10*3/uL (ref 150–400)
RBC: 3.24 MIL/uL — ABNORMAL LOW (ref 3.87–5.11)
RDW: 12.6 % (ref 11.5–15.5)
WBC Count: 2.2 10*3/uL — ABNORMAL LOW (ref 4.0–10.5)
nRBC: 0 % (ref 0.0–0.2)

## 2022-09-09 LAB — CMP (CANCER CENTER ONLY)
ALT: 16 U/L (ref 0–44)
AST: 15 U/L (ref 15–41)
Albumin: 4.2 g/dL (ref 3.5–5.0)
Alkaline Phosphatase: 38 U/L (ref 38–126)
Anion gap: 7 (ref 5–15)
BUN: 12 mg/dL (ref 8–23)
CO2: 31 mmol/L (ref 22–32)
Calcium: 9.3 mg/dL (ref 8.9–10.3)
Chloride: 102 mmol/L (ref 98–111)
Creatinine: 0.82 mg/dL (ref 0.44–1.00)
GFR, Estimated: >60 mL/min (ref >60)
Glucose, Bld: 111 mg/dL — ABNORMAL HIGH (ref 70–99)
Potassium: 3.6 mmol/L (ref 3.5–5.1)
Sodium: 140 mmol/L (ref 135–145)
Total Bilirubin: 0.5 mg/dL (ref 0.3–1.2)
Total Protein: 6.8 g/dL (ref 6.5–8.1)

## 2022-09-09 MED ORDER — PALBOCICLIB 75 MG PO TABS
75.0000 mg | ORAL_TABLET | Freq: Every day | ORAL | 6 refills | Status: DC
  Filled 2022-10-01: qty 21, 28d supply, fill #0

## 2022-09-09 MED ORDER — FULVESTRANT 250 MG/5ML IM SOSY
500.0000 mg | PREFILLED_SYRINGE | Freq: Once | INTRAMUSCULAR | Status: AC
  Administered 2022-09-09: 500 mg via INTRAMUSCULAR
  Filled 2022-09-09: qty 10

## 2022-09-10 LAB — CANCER ANTIGEN 27.29: CA 27.29: 33.9 U/mL (ref 0.0–38.6)

## 2022-09-11 ENCOUNTER — Encounter: Payer: Self-pay | Admitting: Plastic Surgery

## 2022-09-11 ENCOUNTER — Ambulatory Visit (INDEPENDENT_AMBULATORY_CARE_PROVIDER_SITE_OTHER): Payer: BC Managed Care – PPO | Admitting: Plastic Surgery

## 2022-09-11 DIAGNOSIS — Z170 Estrogen receptor positive status [ER+]: Secondary | ICD-10-CM

## 2022-09-11 DIAGNOSIS — Z9013 Acquired absence of bilateral breasts and nipples: Secondary | ICD-10-CM | POA: Diagnosis not present

## 2022-09-11 DIAGNOSIS — Z853 Personal history of malignant neoplasm of breast: Secondary | ICD-10-CM

## 2022-09-11 DIAGNOSIS — Z08 Encounter for follow-up examination after completed treatment for malignant neoplasm: Secondary | ICD-10-CM | POA: Diagnosis not present

## 2022-09-11 DIAGNOSIS — C50512 Malignant neoplasm of lower-outer quadrant of left female breast: Secondary | ICD-10-CM

## 2022-09-11 DIAGNOSIS — Z9012 Acquired absence of left breast and nipple: Secondary | ICD-10-CM

## 2022-09-11 DIAGNOSIS — Z9011 Acquired absence of right breast and nipple: Secondary | ICD-10-CM

## 2022-09-11 DIAGNOSIS — Z9889 Other specified postprocedural states: Secondary | ICD-10-CM

## 2022-09-11 NOTE — Progress Notes (Signed)
Patient ID: Natasha Graham, female    DOB: 04/25/1961, 62 y.o.   MRN: [id number]   Chief Complaint  Patient presents with   Follow-up   Breast Problem    The patient is a 62 year old female here for a yearly follow-up after her breast cancer.  She was diagnosed with right breast cancer after an abnormal mammogram.  Biopsies were done and the tumor was estrogen and progesterone positive.  She had DCIS and she also has a strong family history of breast cancer.  She underwent a right immediate breast reconstruction after mastectomy in September in 2015.  In December 2015 she had the implant placed which was a smooth round high-profile saline 500 cc implant that was filled to 600 cc.  She also had a left breast mastopexy for symmetry.  In January 2021 the patient was found to have irregularities and decided on a left mastectomy.  She underwent a left mastectomy with immediate reconstruction with expander placement.  In July 2022 she underwent removal of both expander and implant and placement of smooth round high-profile saline implants.  On the left she has 350 cc and on the right she has 450 cc.  She had a CT scan done in April and will likely have 1 yearly.  She also had a bone scan.  Everything was negative for breast disease. She is 5 feet 3 inches tall.  She is not a smoker.  She does have scoliosis with spinal rods in place.  She has some tightness of the left breast.  But overall she is happy and doing well.  She also has some muscle spasms on the left breast.  If she relaxes she is able to break the spasm.  She also recognizes that she has not been doing range of motion exercises.     Review of Systems  Constitutional: Negative.   HENT: Negative.    Eyes: Negative.   Respiratory: Negative.  Negative for chest tightness.   Cardiovascular: Negative.   Gastrointestinal: Negative.   Endocrine: Negative.   Genitourinary: Negative.   Musculoskeletal: Negative.     Past Medical History:   Diagnosis Date   Cancer [facility])    breast cancer left 2021 roght 2015   Family history of brain cancer    Family history of breast cancer    Family history of colon cancer    Family history of stomach cancer    History of radiation therapy last done February 06 2020   Hypertension    PMB (postmenopausal bleeding)    PONV (postoperative nausea and vomiting)    likes scopolamine patch   Scoliosis    Wears glasses    Wears glasses     Past Surgical History:  Procedure Laterality Date   AUGMENTATION MAMMAPLASTY Right    2015 post mastectomy   AXILLARY LYMPH NODE DISSECTION Left 11/13/2019   Procedure: LEFT AXILLARY LYMPH NODE DISSECTION;  Surgeon: Rolm Bookbinder, MD;  Location: [place];  Service: General;  Laterality: Left;   BREAST IMPLANT EXCHANGE Right 03/19/2021   Procedure: exchange of right saline implant;  Surgeon: Wallace Going, DO;  Location: [place];  Service: Plastics;  Laterality: Right;   BREAST RECONSTRUCTION WITH PLACEMENT OF TISSUE EXPANDER AND FLEX HD (ACELLULAR HYDRATED DERMIS) Right 05/16/2014   Procedure: IMMEDIATE RIGHT BREAST RECONSTRUCTION WITH PLACEMENT OF TISSUE EXPANDER AND FLEX HD (ACELLULAR HYDRATED DERMIS);  Surgeon: Theodoro Kos, DO;  Location: [place];  Service: Plastics;  Laterality:  Right;   BREAST RECONSTRUCTION WITH PLACEMENT OF TISSUE EXPANDER AND FLEX HD (ACELLULAR HYDRATED DERMIS) Left 09/27/2019   Procedure: LEFT BREAST RECONSTRUCTION WITH PLACEMENT OF TISSUE EXPANDER AND FLEX HD (ACELLULAR HYDRATED DERMIS);  Surgeon: Wallace Going, DO;  Location: [place];  Service: Plastics;  Laterality: Left;   BREAST SURGERY     right breast excisional biopsy   DILATATION & CURETTAGE/HYSTEROSCOPY WITH MYOSURE N/A 12/20/2020   Procedure: [place];  Surgeon: Brien Few, MD;  Location: [place];  Service: Gynecology;  Laterality: N/A;    DILATION AND CURETTAGE OF UTERUS     HYSTEROSCOPY WITH D & C N/A 09/20/2015   Procedure: DILATATION AND CURETTAGE /HYSTEROSCOPY;  Surgeon: Brien Few, MD;  Location: [facility];  Service: Gynecology;  Laterality: N/A;   LIPOSUCTION Bilateral 08/08/2014   Procedure: LIPO SUCTION ;  Surgeon: Theodoro Kos, DO;  Location: [place];  Service: Plastics;  Laterality: Bilateral;   MASTECTOMY Right 05/16/2014   placement of acellular dermal matrix & tissue expanders    MASTOPEXY Left 08/08/2014   Procedure:  MASTOPEXY FOR SYMMETRY;  Surgeon: Theodoro Kos, DO;  Location: [place];  Service: Plastics;  Laterality: Left;   NIPPLE SPARING MASTECTOMY WITH SENTINEL LYMPH NODE BIOPSY Left 09/27/2019   Procedure: LEFT NIPPLE SPARING MASTECTOMY WITH LEFT AXILLARY SENTINEL LYMPH NODE BIOPSY;  Surgeon: Rolm Bookbinder, MD;  Location: [place];  Service: General;  Laterality: Left;   REDUCTION MAMMAPLASTY Left    2015   REMOVAL OF TISSUE EXPANDER AND PLACEMENT OF IMPLANT Right 08/08/2014   Procedure: REMOVAL OF RIGHT  TISSUE EXPANDERS WITH PLACEMENT OF RIGHT BREAST IMPLANTS WITH LIPO SUCTION ;  Surgeon: Theodoro Kos, DO;  Location: [place];  Service: Plastics;  Laterality: Right;   REMOVAL OF TISSUE EXPANDER AND PLACEMENT OF IMPLANT Left 03/19/2021   Procedure: Removal of left expander for saline implant;  Surgeon: Wallace Going, DO;  Location: [place];  Service: Plastics;  Laterality: Left;  2 hours   ROBOTIC ASSISTED TOTAL HYSTERECTOMY WITH BILATERAL SALPINGO OOPHERECTOMY Bilateral 02/06/2021   Procedure: XI ROBOTIC ASSISTED TOTAL HYSTERECTOMY WITH BILATERAL SALPINGO OOPHORECTOMY, LYSIS of SIGMOID ADHESIONS MCCALL CUL DE PLASTY;  Surgeon: Brien Few, MD;  Location: [place];  Service: Gynecology;  Laterality: Bilateral;   SCAR REVISION Left 11/13/2019   Procedure: EXCISION OF LEFT MASTECTOMY SKIN;   Surgeon: Rolm Bookbinder, MD;  Location: [place];  Service: General;  Laterality: Left;   scoliosis  1972   harrington rods-age 33   TONSILLECTOMY  as child      Current Outpatient Medications:    acetaminophen (TYLENOL) 500 MG tablet, Take 500 mg by mouth., Disp: , Rfl:    Ascorbic Acid (VITAMIN C) 1000 MG tablet, Take 100 mg by mouth daily. 1000 mg, Disp: , Rfl:    Boswellia-Glucosamine-Vit D (OSTEO BI-FLEX ONE PER DAY PO), Take by mouth., Disp: , Rfl:    Calcium Carb-Cholecalciferol (CALCIUM PLUS VITAMIN D3) 600-20 MG-MCG TABS, Take by mouth 2 (two) times daily., Disp: , Rfl:    clotrimazole-betamethasone (LOTRISONE) cream, Apply 1 Application topically 2 (two) times daily., Disp: 30 g, Rfl: 0   Coenzyme Q10 (COQ10) 200 MG CAPS, Take 200 mg by mouth daily., Disp: , Rfl:    denosumab (XGEVA) 120 MG/1.7ML SOLN injection, Inject 120 mg into the skin once. Monthly, Disp: , Rfl:    Flaxseed Oil (LINSEED  OIL) OIL, 1,200 mg daily., Disp: , Rfl:    Fulvestrant (FASLODEX IM), Inject into the muscle. Shot every 4 weeks., Disp: , Rfl:    Ginger, Zingiber officinalis, (GINGER PO), Take 550 mg by mouth., Disp: , Rfl:    ibuprofen (ADVIL) 200 MG tablet, Take by mouth. Takes 2 of 200 mg, Disp: , Rfl:    LISINOPRIL-HYDROCHLOROTHIAZIDE PO, Take 1 tablet by mouth daily at 2 PM. 30/25 mg, Disp: , Rfl:    Magnesium 400 MG TABS, Take 400 mg by mouth daily., Disp: , Rfl:    Multiple Vitamin (MULTIVITAMIN) capsule, Take by mouth., Disp: , Rfl:    OVER THE COUNTER MEDICATION, Calcium '1200mg'$ -Take daily., Disp: , Rfl:    OVER THE COUNTER MEDICATION, Trubiotics-Take daily., Disp: , Rfl:    palbociclib (IBRANCE) 75 MG tablet, Take 1 tablet (75 mg total) by mouth daily. Take for 21 days on, 7 days off, repeat every 28 days., Disp: 21 tablet, Rfl: 6   potassium chloride SA (KLOR-CON M) 20 MEQ tablet, Take 1 tablet (20 mEq total) by mouth daily. (Patient taking differently: Take 99 tablets by mouth  2 (two) times daily. OTC  99 mg), Disp: 4 tablet, Rfl: 0   Turmeric 500 MG CAPS, Take 500 mg by mouth 2 (two) times daily., Disp: , Rfl:    Objective:   There were no vitals filed for this visit.  Physical Exam Constitutional:      Appearance: Normal appearance.  HENT:     Head: Normocephalic and atraumatic.  Cardiovascular:     Rate and Rhythm: Normal rate.     Pulses: Normal pulses.  Pulmonary:     Effort: Pulmonary effort is normal.  Abdominal:     General: There is no distension.     Palpations: Abdomen is soft.  Musculoskeletal:        General: No swelling or deformity.  Skin:    General: Skin is warm.     Capillary Refill: Capillary refill takes less than 2 seconds.     Coloration: Skin is not jaundiced.     Findings: No bruising.  Neurological:     Mental Status: She is alert and oriented to person, place, and time.  Psychiatric:        Mood and Affect: Mood normal.        Behavior: Behavior normal.        Thought Content: Thought content normal.        Judgment: Judgment normal.     Assessment & Plan:  Malignant neoplasm of lower-outer quadrant of left breast of female, estrogen receptor positive (HCC)  S/P mastectomy, left  Status post right breast reconstruction  Acquired absence of right breast  Recommend range of motion exercises.  PT recommended but patient will hold off for now.  I would like to see her back in 1 year.  No additional films are needed at this time.  Continue with massage. Pictures were obtained of the patient and placed in the chart with the patient's or guardian's permission.   [place], DO

## 2022-09-25 ENCOUNTER — Other Ambulatory Visit: Payer: Self-pay

## 2022-09-30 ENCOUNTER — Other Ambulatory Visit (HOSPITAL_COMMUNITY): Payer: Self-pay

## 2022-10-01 ENCOUNTER — Other Ambulatory Visit (HOSPITAL_COMMUNITY): Payer: Self-pay

## 2022-10-01 ENCOUNTER — Other Ambulatory Visit: Payer: Self-pay

## 2022-10-05 ENCOUNTER — Other Ambulatory Visit: Payer: Self-pay

## 2022-10-06 ENCOUNTER — Other Ambulatory Visit (HOSPITAL_COMMUNITY): Payer: Self-pay

## 2022-10-06 ENCOUNTER — Other Ambulatory Visit: Payer: Self-pay | Admitting: Hematology

## 2022-10-06 ENCOUNTER — Telehealth: Payer: Self-pay | Admitting: Pharmacy Technician

## 2022-10-06 ENCOUNTER — Telehealth: Payer: Self-pay | Admitting: Pharmacist

## 2022-10-06 ENCOUNTER — Encounter: Payer: Self-pay | Admitting: Hematology

## 2022-10-06 MED ORDER — ABEMACICLIB 100 MG PO TABS
100.0000 mg | ORAL_TABLET | Freq: Two times a day (BID) | ORAL | 0 refills | Status: DC
  Filled 2022-10-06 (×2): qty 28, 14d supply, fill #0
  Filled 2022-10-21: qty 28, 14d supply, fill #1

## 2022-10-06 NOTE — Telephone Encounter (Signed)
Oral Oncology Patient Advocate Encounter   Was successful in obtaining a copay card for Verzenio.  This copay card will make the patients copay $0.  I have spoken with the patient.    The billing information is as follows and has been shared with [facility].   RxBin: [id number] PCN: [id number] Member ID: [id number] Group ID: [id number]   [name], CPhT-Adv Oncology Pharmacy Patient [place] Direct Number: [phone]  Fax: [phone]

## 2022-10-06 NOTE — Telephone Encounter (Signed)
Oral Oncology Patient Advocate Encounter   Received notification that prior authorization for Verzenio is required.   PA submitted on 10/06/22 Key [id number] Status is pending     [name], CPhT-Adv Oncology Pharmacy Patient [place] Direct Number: [phone]  Fax: [phone]

## 2022-10-06 NOTE — Telephone Encounter (Signed)
Oral Oncology Pharmacist Encounter  Received new prescription for Verzenio (abemaciclib) for the treatment of ER/PR positive, HER-2 negative breast cancer in conjunction with fulvestrant, planned duration until disease progression or unacceptable drug toxicity. Of note - patient has been on Ibrance since 09/12/20, but due to decrease in copay card amount allotted for the year from [place], patient is having to switch to [employer] as out of pocket cost is now unaffordable for [employer].   CBC w/ Diff and CMP from 09/09/22 assessed, noted pt with WBC 2.2 K/uL (AND 1300). Patient starting on a dose reduction of Verzenio at 100 mg BID due to concerns for tolerance at full dose. Prescription dose and frequency assessed for appropriateness.   Current medication list in Epic reviewed, no relevant/significant DDIs with Verzenio identified.  Evaluated chart and no patient barriers to medication adherence noted.   Prescription has been e-scribed to the [facility] for benefits analysis and approval.  Oral Oncology Clinic will continue to follow for insurance authorization, copayment issues, initial counseling and start date.  Leron Croak, PharmD, BCPS, [facility] Hematology/Oncology Clinical Pharmacist [facility] and [place] [phone] 10/06/2022 11:36 AM

## 2022-10-06 NOTE — Telephone Encounter (Signed)
Oral Oncology Patient Advocate Encounter  Prior Authorization for Natasha Graham has been approved.    PA# [id number] Effective dates: 10/06/22 through 10/05/23  Patients co-pay is $6,075 for 14 days  Patient must fill 14 day supply for first 4 fills of medication.   Natasha Graham, CPhT-Adv Oncology Pharmacy Patient [place] Direct Number: [phone]  Fax: [phone]

## 2022-10-06 NOTE — Telephone Encounter (Signed)
Oral Chemotherapy Pharmacist Encounter  I spoke with patient for overview of: Verzenio (abemaciclib) for the treatment of ER/PR positive, HER-2 negative breast cancer in conjunction with fulvestrant, planned duration until disease progression or unacceptable drug toxicity.   Counseled patient on administration, dosing, side effects, monitoring, drug-food interactions, safe handling, storage, and disposal.  Patient will take Verzenio '100mg'$  tablets, 1 tablet by mouth twice daily without regard to food.  Patient knows to avoid grapefruit and grapefruit juice.  Verzenio start date: 10/12/22  Adverse effects include but are not limited to: diarrhea, fatigue, nausea, abdominal pain, decreased blood counts, and increased liver function tests, and joint pains. Severe, life-threatening, and/or fatal interstitial lung disease (ILD) and/or pneumonitis may occur with CDK 4/6 inhibitors. Patient will obtain anti diarrheal and alert the office of 4 or more loose stools above baseline.  Reviewed with patient importance of keeping a medication schedule and plan for any missed doses. No barriers to medication adherence identified.  Medication reconciliation performed and medication/allergy list updated.  All questions answered.  Ms. Glotfelty voiced understanding and appreciation.   Medication education handout placed in mail for patient. Patient knows to call the office with questions or concerns. Oral Chemotherapy Clinic phone number provided to patient.   Leron Croak, PharmD, BCPS, BCOP Hematology/Oncology Clinical Pharmacist [facility] and [place] [phone] 10/06/2022 4:05 PM

## 2022-10-07 ENCOUNTER — Inpatient Hospital Stay: Payer: BC Managed Care – PPO | Attending: Hematology

## 2022-10-07 ENCOUNTER — Other Ambulatory Visit (HOSPITAL_COMMUNITY): Payer: Self-pay

## 2022-10-07 ENCOUNTER — Other Ambulatory Visit: Payer: Self-pay

## 2022-10-07 VITALS — BP 145/82 | HR 93 | Temp 99.1°F | Resp 18

## 2022-10-07 DIAGNOSIS — Z170 Estrogen receptor positive status [ER+]: Secondary | ICD-10-CM | POA: Diagnosis not present

## 2022-10-07 DIAGNOSIS — C50512 Malignant neoplasm of lower-outer quadrant of left female breast: Secondary | ICD-10-CM | POA: Insufficient documentation

## 2022-10-07 DIAGNOSIS — C7951 Secondary malignant neoplasm of bone: Secondary | ICD-10-CM | POA: Insufficient documentation

## 2022-10-07 DIAGNOSIS — Z5111 Encounter for antineoplastic chemotherapy: Secondary | ICD-10-CM | POA: Diagnosis not present

## 2022-10-07 MED ORDER — FULVESTRANT 250 MG/5ML IM SOSY
500.0000 mg | PREFILLED_SYRINGE | Freq: Once | INTRAMUSCULAR | Status: AC
  Administered 2022-10-07: 500 mg via INTRAMUSCULAR
  Filled 2022-10-07: qty 10

## 2022-10-09 ENCOUNTER — Other Ambulatory Visit: Payer: Self-pay

## 2022-10-10 ENCOUNTER — Other Ambulatory Visit (HOSPITAL_COMMUNITY): Payer: Self-pay

## 2022-10-12 ENCOUNTER — Other Ambulatory Visit (HOSPITAL_COMMUNITY): Payer: Self-pay

## 2022-10-12 ENCOUNTER — Other Ambulatory Visit: Payer: Self-pay

## 2022-10-21 ENCOUNTER — Other Ambulatory Visit (HOSPITAL_COMMUNITY): Payer: Self-pay

## 2022-10-21 ENCOUNTER — Encounter: Payer: Self-pay | Admitting: Pharmacist

## 2022-10-21 NOTE — Telephone Encounter (Signed)
Erroneous Encounter

## 2022-10-22 ENCOUNTER — Other Ambulatory Visit (HOSPITAL_COMMUNITY): Payer: Self-pay

## 2022-10-23 ENCOUNTER — Other Ambulatory Visit (HOSPITAL_COMMUNITY): Payer: Self-pay

## 2022-10-26 ENCOUNTER — Other Ambulatory Visit (HOSPITAL_COMMUNITY): Payer: Self-pay

## 2022-10-27 ENCOUNTER — Other Ambulatory Visit: Payer: Self-pay | Admitting: Hematology

## 2022-10-27 ENCOUNTER — Other Ambulatory Visit (HOSPITAL_COMMUNITY): Payer: Self-pay

## 2022-10-27 ENCOUNTER — Telehealth: Payer: Self-pay | Admitting: Pharmacist

## 2022-10-27 MED ORDER — ABEMACICLIB 100 MG PO TABS
100.0000 mg | ORAL_TABLET | Freq: Two times a day (BID) | ORAL | 0 refills | Status: DC
  Filled 2022-10-27: qty 28, 14d supply, fill #0
  Filled 2022-11-19: qty 28, 14d supply, fill #1

## 2022-10-27 NOTE — Telephone Encounter (Signed)
Oral Chemotherapy Pharmacist Encounter   Spoke with patient today to follow up regarding patient's oral chemotherapy medication: Verzenio (abemaciclib)  Original Start date of oral chemotherapy: 10/12/22   Patient reports 0 tablets/doses of Verzenio missed in the last 3 weeks.   Overall patients states she is tolerating Verzenio OK. She states she has only experienced 3 episodes of diarrhea since starting the medication. No episodes of diarrhea reported since starting the third week of Verzenio. Patient states she has had some occasional issues with nausea since starting Verzenio. We discussed taking with food may help GI upset. She also states that she finds success with sucking on hard candy if she feels nauseous. Asked patient if she would like an anti-emetic to have on hand at home PRN for N/V, but she declined at this time. She knows to let the office know if she has issues with N/V in the future or if it persist despite her current ways to help manage this.   Patient aware of lab and injection appt on 11/04/22. No other questions or concerns at this time.   Leron Croak, PharmD, BCPS, BCOP Hematology/Oncology Clinical Pharmacist [facility] and [place] [phone] 10/27/2022 2:41 PM

## 2022-10-28 ENCOUNTER — Other Ambulatory Visit (HOSPITAL_COMMUNITY): Payer: Self-pay

## 2022-10-30 ENCOUNTER — Other Ambulatory Visit (HOSPITAL_COMMUNITY): Payer: Self-pay

## 2022-10-30 ENCOUNTER — Other Ambulatory Visit: Payer: Self-pay

## 2022-11-02 ENCOUNTER — Other Ambulatory Visit: Payer: Self-pay

## 2022-11-04 ENCOUNTER — Inpatient Hospital Stay: Payer: BC Managed Care – PPO | Attending: Hematology

## 2022-11-04 ENCOUNTER — Inpatient Hospital Stay: Payer: BC Managed Care – PPO

## 2022-11-04 VITALS — BP 153/80 | HR 93 | Temp 98.1°F | Resp 20

## 2022-11-04 DIAGNOSIS — Z79899 Other long term (current) drug therapy: Secondary | ICD-10-CM | POA: Insufficient documentation

## 2022-11-04 DIAGNOSIS — C50512 Malignant neoplasm of lower-outer quadrant of left female breast: Secondary | ICD-10-CM | POA: Insufficient documentation

## 2022-11-04 DIAGNOSIS — C7951 Secondary malignant neoplasm of bone: Secondary | ICD-10-CM

## 2022-11-04 DIAGNOSIS — Z170 Estrogen receptor positive status [ER+]: Secondary | ICD-10-CM | POA: Diagnosis not present

## 2022-11-04 DIAGNOSIS — Z5111 Encounter for antineoplastic chemotherapy: Secondary | ICD-10-CM | POA: Insufficient documentation

## 2022-11-04 LAB — CBC WITH DIFFERENTIAL (CANCER CENTER ONLY)
Abs Immature Granulocytes: 0.02 10*3/uL (ref 0.00–0.07)
Basophils Absolute: 0 10*3/uL (ref 0.0–0.1)
Basophils Relative: 1 %
Eosinophils Absolute: 0.1 10*3/uL (ref 0.0–0.5)
Eosinophils Relative: 3 %
HCT: 33.1 % — ABNORMAL LOW (ref 36.0–46.0)
Hemoglobin: 12.2 g/dL (ref 12.0–15.0)
Immature Granulocytes: 1 %
Lymphocytes Relative: 26 %
Lymphs Abs: 0.8 10*3/uL (ref 0.7–4.0)
MCH: 36.7 pg — ABNORMAL HIGH (ref 26.0–34.0)
MCHC: 36.9 g/dL — ABNORMAL HIGH (ref 30.0–36.0)
MCV: 99.7 fL (ref 80.0–100.0)
Monocytes Absolute: 0.3 10*3/uL (ref 0.1–1.0)
Monocytes Relative: 9 %
Neutro Abs: 2 10*3/uL (ref 1.7–7.7)
Neutrophils Relative %: 60 %
Platelet Count: 271 10*3/uL (ref 150–400)
RBC: 3.32 MIL/uL — ABNORMAL LOW (ref 3.87–5.11)
RDW: 11.4 % — ABNORMAL LOW (ref 11.5–15.5)
WBC Count: 3.2 10*3/uL — ABNORMAL LOW (ref 4.0–10.5)
nRBC: 0 % (ref 0.0–0.2)

## 2022-11-04 LAB — CMP (CANCER CENTER ONLY)
ALT: 22 U/L (ref 0–44)
AST: 18 U/L (ref 15–41)
Albumin: 3.9 g/dL (ref 3.5–5.0)
Alkaline Phosphatase: 47 U/L (ref 38–126)
Anion gap: 10 (ref 5–15)
BUN: 12 mg/dL (ref 8–23)
CO2: 28 mmol/L (ref 22–32)
Calcium: 9.4 mg/dL (ref 8.9–10.3)
Chloride: 102 mmol/L (ref 98–111)
Creatinine: 1.05 mg/dL — ABNORMAL HIGH (ref 0.44–1.00)
GFR, Estimated: 60 mL/min — ABNORMAL LOW (ref >60)
Glucose, Bld: 107 mg/dL — ABNORMAL HIGH (ref 70–99)
Potassium: 3.6 mmol/L (ref 3.5–5.1)
Sodium: 140 mmol/L (ref 135–145)
Total Bilirubin: 0.4 mg/dL (ref 0.3–1.2)
Total Protein: 6.9 g/dL (ref 6.5–8.1)

## 2022-11-04 MED ORDER — FULVESTRANT 250 MG/5ML IM SOSY
500.0000 mg | PREFILLED_SYRINGE | Freq: Once | INTRAMUSCULAR | Status: AC
  Administered 2022-11-04: 500 mg via INTRAMUSCULAR
  Filled 2022-11-04: qty 10

## 2022-11-04 MED ORDER — DENOSUMAB 120 MG/1.7ML ~~LOC~~ SOLN
120.0000 mg | Freq: Once | SUBCUTANEOUS | Status: AC
  Administered 2022-11-04: 120 mg via SUBCUTANEOUS
  Filled 2022-11-04: qty 1.7

## 2022-11-12 ENCOUNTER — Other Ambulatory Visit: Payer: Self-pay

## 2022-11-13 ENCOUNTER — Encounter: Payer: Self-pay | Admitting: Hematology

## 2022-11-19 ENCOUNTER — Other Ambulatory Visit (HOSPITAL_COMMUNITY): Payer: Self-pay

## 2022-11-21 ENCOUNTER — Other Ambulatory Visit (HOSPITAL_COMMUNITY): Payer: Self-pay

## 2022-11-23 ENCOUNTER — Other Ambulatory Visit (HOSPITAL_COMMUNITY): Payer: Self-pay

## 2022-12-01 ENCOUNTER — Other Ambulatory Visit: Payer: Self-pay

## 2022-12-01 ENCOUNTER — Other Ambulatory Visit (HOSPITAL_COMMUNITY): Payer: Self-pay

## 2022-12-01 ENCOUNTER — Other Ambulatory Visit: Payer: Self-pay | Admitting: Hematology

## 2022-12-01 MED ORDER — ABEMACICLIB 100 MG PO TABS
100.0000 mg | ORAL_TABLET | Freq: Two times a day (BID) | ORAL | 0 refills | Status: DC
  Filled 2022-12-01: qty 56, 28d supply, fill #0

## 2022-12-02 ENCOUNTER — Other Ambulatory Visit: Payer: Self-pay

## 2022-12-02 ENCOUNTER — Inpatient Hospital Stay: Payer: BC Managed Care – PPO | Attending: Hematology

## 2022-12-02 VITALS — BP 167/87 | HR 88 | Temp 99.0°F | Resp 20

## 2022-12-02 DIAGNOSIS — C7951 Secondary malignant neoplasm of bone: Secondary | ICD-10-CM | POA: Insufficient documentation

## 2022-12-02 DIAGNOSIS — Z5111 Encounter for antineoplastic chemotherapy: Secondary | ICD-10-CM | POA: Diagnosis not present

## 2022-12-02 DIAGNOSIS — Z170 Estrogen receptor positive status [ER+]: Secondary | ICD-10-CM | POA: Insufficient documentation

## 2022-12-02 DIAGNOSIS — Z79899 Other long term (current) drug therapy: Secondary | ICD-10-CM | POA: Insufficient documentation

## 2022-12-02 DIAGNOSIS — C50512 Malignant neoplasm of lower-outer quadrant of left female breast: Secondary | ICD-10-CM | POA: Diagnosis not present

## 2022-12-02 MED ORDER — DENOSUMAB 120 MG/1.7ML ~~LOC~~ SOLN: 120.0000 mg | Freq: Once | SUBCUTANEOUS | Status: DC

## 2022-12-02 MED ORDER — FULVESTRANT 250 MG/5ML IM SOSY
500.0000 mg | PREFILLED_SYRINGE | Freq: Once | INTRAMUSCULAR | Status: AC
  Administered 2022-12-02: 500 mg via INTRAMUSCULAR
  Filled 2022-12-02: qty 10

## 2022-12-21 ENCOUNTER — Ambulatory Visit (HOSPITAL_COMMUNITY)
Admission: RE | Admit: 2022-12-21 | Discharge: 2022-12-21 | Disposition: A | Discharge status: Home / Self Care | Payer: BC Managed Care – PPO | Source: Ambulatory Visit | Attending: Hematology | Admitting: Hematology

## 2022-12-21 DIAGNOSIS — C7951 Secondary malignant neoplasm of bone: Secondary | ICD-10-CM | POA: Insufficient documentation

## 2022-12-21 DIAGNOSIS — C50919 Malignant neoplasm of unspecified site of unspecified female breast: Secondary | ICD-10-CM | POA: Diagnosis not present

## 2022-12-21 LAB — GLUCOSE, CAPILLARY: Glucose-Capillary: 123 mg/dL — ABNORMAL HIGH (ref 70–99)

## 2022-12-21 MED ORDER — FLUDEOXYGLUCOSE F - 18 (FDG) INJECTION
8.5000 | Freq: Once | INTRAVENOUS | Status: AC
  Administered 2022-12-21: 8.62 via INTRAVENOUS

## 2022-12-23 ENCOUNTER — Other Ambulatory Visit: Payer: Self-pay | Admitting: Hematology

## 2022-12-23 ENCOUNTER — Other Ambulatory Visit (HOSPITAL_COMMUNITY): Payer: Self-pay

## 2022-12-23 MED ORDER — ABEMACICLIB 100 MG PO TABS
100.0000 mg | ORAL_TABLET | Freq: Two times a day (BID) | ORAL | 0 refills | Status: DC
  Filled 2022-12-23: qty 56, 28d supply, fill #0

## 2022-12-24 ENCOUNTER — Other Ambulatory Visit: Payer: Self-pay

## 2022-12-25 ENCOUNTER — Other Ambulatory Visit (HOSPITAL_COMMUNITY): Payer: Self-pay

## 2022-12-29 NOTE — Assessment & Plan Note (Signed)
Metastatic Breast Cancer to bones, lobular carcinoma, ER+/PR+, HER2 IHC 2+ (low expression) -left breast cancer initially diagnosed 07/12/19. S/p left mastectomy on 09/27/19 showed stage IIIA p(T3, N1) ILC, SLND on 11/13/19 was negative (0/7). Mammaprint was low risk. -she received postmastectomy radiation 12/26/19 - 02/06/20 and took tamoxifen 02/29/20 - 07/2020 -genetics 06/27/20 were negative. -she was found to have bony metastatic disease in 07/2020, confirmed with bone biopsy 08/06/20. -she began Xgeva and fulvestrant on 08/15/20 and palbociclib on 09/12/20. She tolerates well overall. -FoundationOne testing obtained on her 07/2020 biopsy showed no targetable mutation.  -last bone scan in 07/2022 showed no hypermetabolic lesions  -Continue fulvestrant injection and Ibrance, she has had a good response so far.  She is clinically doing well. Plan for repeat scans every 6 months -She has been on monthly Xgeva for 2 years now, will change to every 3 months -PET scan on 12/22/2022 showed treated diffuse bone mets, no hypermetabolic uptake

## 2022-12-30 ENCOUNTER — Other Ambulatory Visit: Payer: Self-pay

## 2022-12-30 ENCOUNTER — Inpatient Hospital Stay: Payer: BC Managed Care – PPO | Attending: Hematology | Admitting: Hematology

## 2022-12-30 ENCOUNTER — Inpatient Hospital Stay: Payer: BC Managed Care – PPO

## 2022-12-30 ENCOUNTER — Encounter: Payer: Self-pay | Admitting: Hematology

## 2022-12-30 ENCOUNTER — Other Ambulatory Visit (HOSPITAL_COMMUNITY): Payer: Self-pay

## 2022-12-30 VITALS — BP 168/83 | HR 95 | Temp 98.2°F | Resp 16 | Ht 64.0 in | Wt 169.8 lb

## 2022-12-30 DIAGNOSIS — C7951 Secondary malignant neoplasm of bone: Secondary | ICD-10-CM

## 2022-12-30 DIAGNOSIS — Z9012 Acquired absence of left breast and nipple: Secondary | ICD-10-CM | POA: Insufficient documentation

## 2022-12-30 DIAGNOSIS — Z79899 Other long term (current) drug therapy: Secondary | ICD-10-CM | POA: Insufficient documentation

## 2022-12-30 DIAGNOSIS — Z5111 Encounter for antineoplastic chemotherapy: Secondary | ICD-10-CM | POA: Diagnosis not present

## 2022-12-30 DIAGNOSIS — Z170 Estrogen receptor positive status [ER+]: Secondary | ICD-10-CM | POA: Insufficient documentation

## 2022-12-30 DIAGNOSIS — C50512 Malignant neoplasm of lower-outer quadrant of left female breast: Secondary | ICD-10-CM | POA: Insufficient documentation

## 2022-12-30 LAB — CMP (CANCER CENTER ONLY)
ALT: 33 U/L (ref 0–44)
AST: 28 U/L (ref 15–41)
Albumin: 4.2 g/dL (ref 3.5–5.0)
Alkaline Phosphatase: 44 U/L (ref 38–126)
Anion gap: 7 (ref 5–15)
BUN: 10 mg/dL (ref 8–23)
CO2: 30 mmol/L (ref 22–32)
Calcium: 9.2 mg/dL (ref 8.9–10.3)
Chloride: 105 mmol/L (ref 98–111)
Creatinine: 1.01 mg/dL — ABNORMAL HIGH (ref 0.44–1.00)
GFR, Estimated: >60 mL/min (ref >60)
Glucose, Bld: 108 mg/dL — ABNORMAL HIGH (ref 70–99)
Potassium: 3.6 mmol/L (ref 3.5–5.1)
Sodium: 142 mmol/L (ref 135–145)
Total Bilirubin: 0.5 mg/dL (ref 0.3–1.2)
Total Protein: 6.8 g/dL (ref 6.5–8.1)

## 2022-12-30 LAB — CBC WITH DIFFERENTIAL (CANCER CENTER ONLY)
Abs Immature Granulocytes: 0 10*3/uL (ref 0.00–0.07)
Basophils Absolute: 0.1 10*3/uL (ref 0.0–0.1)
Basophils Relative: 2 %
Eosinophils Absolute: 0.1 10*3/uL (ref 0.0–0.5)
Eosinophils Relative: 2 %
HCT: 35.1 % — ABNORMAL LOW (ref 36.0–46.0)
Hemoglobin: 12.4 g/dL (ref 12.0–15.0)
Immature Granulocytes: 0 %
Lymphocytes Relative: 30 %
Lymphs Abs: 1 10*3/uL (ref 0.7–4.0)
MCH: 36 pg — ABNORMAL HIGH (ref 26.0–34.0)
MCHC: 35.3 g/dL (ref 30.0–36.0)
MCV: 102 fL — ABNORMAL HIGH (ref 80.0–100.0)
Monocytes Absolute: 0.3 10*3/uL (ref 0.1–1.0)
Monocytes Relative: 10 %
Neutro Abs: 1.8 10*3/uL (ref 1.7–7.7)
Neutrophils Relative %: 56 %
Platelet Count: 259 10*3/uL (ref 150–400)
RBC: 3.44 MIL/uL — ABNORMAL LOW (ref 3.87–5.11)
RDW: 12.1 % (ref 11.5–15.5)
WBC Count: 3.1 10*3/uL — ABNORMAL LOW (ref 4.0–10.5)
nRBC: 0 % (ref 0.0–0.2)

## 2022-12-30 MED ORDER — DENOSUMAB 120 MG/1.7ML ~~LOC~~ SOLN
120.0000 mg | Freq: Once | SUBCUTANEOUS | Status: AC
  Administered 2022-12-30: 120 mg via SUBCUTANEOUS
  Filled 2022-12-30: qty 1.7

## 2022-12-30 MED ORDER — ABEMACICLIB 100 MG PO TABS
100.0000 mg | ORAL_TABLET | Freq: Two times a day (BID) | ORAL | 2 refills | Status: DC
  Filled 2022-12-30: qty 56, 28d supply, fill #0
  Filled 2023-01-21: qty 56, 28d supply, fill #1
  Filled 2023-02-17: qty 56, 28d supply, fill #2

## 2022-12-30 MED ORDER — FULVESTRANT 250 MG/5ML IM SOSY
500.0000 mg | PREFILLED_SYRINGE | Freq: Once | INTRAMUSCULAR | Status: AC
  Administered 2022-12-30: 500 mg via INTRAMUSCULAR
  Filled 2022-12-30: qty 10

## 2022-12-30 NOTE — Progress Notes (Addendum)
[facility]   [phone]) [phone] [phone]   Clinic Follow up Note   Patient Care Team: Hamrick, Durward Fortes, MD as PCP - General (Family Medicine) Emelia Loron, MD as Consulting Physician (General Surgery) Pershing Proud, RN as Registered Nurse Donnelly Angelica, RN as Registered Nurse Dillingham, Alena Bills, DO as Attending Physician (Plastic Surgery) Antony Blackbird, MD as Consulting Physician (Radiation Oncology) Natasha Mackie, MD as Consulting Physician (Obstetrics and Gynecology) Natasha Graham, RPH-CPP (Pharmacist) Natasha Mood, MD as Consulting Physician (Medical Oncology)  Date of Service:  12/30/2022  CHIEF COMPLAINT: f/u of  metastatic breast cancer   CURRENT THERAPY:  -denosumab and fulvestrant q4weeks, started 08/15/20, change Xgeva to every 3 months in 12/2022 Verzenio 100 mg orally  2 times daily started 10/2022  ASSESSMENT:  Natasha Graham is a 62 y.o. female with   Malignant neoplasm metastatic to bone [employer]) Metastatic Breast Cancer to bones, lobular carcinoma, ER+/PR+, HER2 IHC 2+ (low expression) -left breast cancer initially diagnosed 07/12/19. S/p left mastectomy on 09/27/19 showed stage IIIA p(T3, N1) ILC, SLND on 11/13/19 was negative (0/7). Mammaprint was low risk. -she received postmastectomy radiation 12/26/19 - 02/06/20 and took tamoxifen 02/29/20 - 07/2020 -genetics 06/27/20 were negative. -she was found to have bony metastatic disease in 07/2020, confirmed with bone biopsy 08/06/20. -she began Xgeva and fulvestrant on 08/15/20 and palbociclib on 09/12/20. She tolerates well overall. -FoundationOne testing obtained on her 07/2020 biopsy showed no targetable mutation.  -last bone scan in 07/2022 showed no hypermetabolic lesions  -Continue fulvestrant injection and Ibrance, she has had a good response so far.  She is clinically doing well. Plan for repeat scans every 6 months -She has been on monthly Xgeva for 2 years now, will change  to every 3 months -PET scan on 12/22/2022 showed treated diffuse bone mets, no hypermetabolic uptake  -Due to high co-pay, Ilda Foil was switched to [employer] in February 2024, she is tolerating well with low-dose 100 mg twice daily, with occasional diarrhea. -Patient asked if she can come off treatment, we discussed that she very likely still has residual disease despite negative PET scan to scan limitation and sensitivity, we discussed the role of GuardianReveal to do timed microscopic disease, and I encouraged her to stay on the same treatment.  She also asked if she can come off fulvestrant injection and switch to oral medicine.  If Guardian review shows negative ESR 1 mutation, I think that is okay to switch. -after lengthy discussion, patient agrees to stay on current treatment -Follow-up in 3 months    PLAN: -lab reviewed -CMP- pending, will proceed fulvestrant injection today and continue every 4 weeks -Ibrance was change to [employer] due to cost issue, she will continue  -will added on tumor marker in her lab  -I refill Verzenio for 3 months -lab and f/u q3 months -will change her monthly Xgeva to every 3 months after dose today   SUMMARY OF ONCOLOGIC HISTORY: Oncology History Overview Note   Cancer Staging  Malignant neoplasm metastatic to bone [facility]) Staging form: Bone - Appendicular Skeleton, Trunk, Skull, and Facial Bones, AJCC 8th Edition - Clinical: Stage IVB (cT3, cN1, cM1b) - Signed by Lowella Dell, MD on 08/15/2020  Malignant neoplasm of lower-outer quadrant of left breast of female, estrogen receptor positive (HCC) Staging form: Breast, AJCC 8th Edition - Clinical: Stage IIIA (cT3, cN1, cM0, G2, ER+, PR-, HER2-) - Signed by Lowella Dell, MD on 08/15/2020     Breast  neoplasm, Tis (DCIS), right (Resolved)  05/16/2014 Initial Diagnosis   Breast neoplasm, Tis (DCIS), right   06/27/2020 Genetic Testing   Negative genetic testing on the  CancerNext-Expanded+RNAinsight panel testing.  The CancerNext-Expanded gene panel offered by [facility] and includes sequencing and rearrangement analysis for the following 77 genes: AIP, ALK, APC*, ATM*, AXIN2, BAP1, BARD1, BLM, BMPR1A, BRCA1*, BRCA2*, BRIP1*, CDC73, CDH1*, CDK4, CDKN1B, CDKN2A, CHEK2*, CTNNA1, DICER1, FANCC, FH, FLCN, GALNT12, KIF1B, LZTR1, MAX, MEN1, MET, MLH1*, MSH2*, MSH3, MSH6*, MUTYH*, NBN, NF1*, NF2, NTHL1, PALB2*, PHOX2B, PMS2*, POT1, PRKAR1A, PTCH1, PTEN*, RAD51C*, RAD51D*, RB1, RECQL, RET, SDHA, SDHAF2, SDHB, SDHC, SDHD, SMAD4, SMARCA4, SMARCB1, SMARCE1, STK11, SUFU, TMEM127, TP53*, TSC1, TSC2, VHL and XRCC2 (sequencing and deletion/duplication); EGFR, EGLN1, HOXB13, KIT, MITF, PDGFRA, POLD1, and POLE (sequencing only); EPCAM and GREM1 (deletion/duplication only). DNA and RNA analyses performed for * genes. The report date is 06/27/2020.   Malignant neoplasm of lower-outer quadrant of left breast of female, estrogen receptor positive (HCC)  10/18/2019 Initial Diagnosis   Malignant neoplasm of lower-outer quadrant of left breast of female, estrogen receptor positive (HCC)   08/15/2020 Cancer Staging   Staging form: Breast, AJCC 8th Edition - Clinical: Stage IIIA (cT3, cN1, cM0, G2, ER+, PR-, HER2-) - Signed by Lowella Dell, MD on 08/15/2020   Malignant neoplasm metastatic to bone (HCC)  08/05/2020 Initial Diagnosis   Bone metastases (HCC)   08/15/2020 Cancer Staging   Staging form: Bone - Appendicular Skeleton, Trunk, Skull, and Facial Bones, AJCC 8th Edition - Clinical: Stage IVB (cT3, cN1, cM1b) - Signed by Lowella Dell, MD on 08/15/2020   12/21/2022 PET scan    IMPRESSION: Status post bilateral breast reconstruction with left axillary lymph node dissection.   Multifocal/diffuse treated osseous metastases, as above.   Otherwise, no evidence of recurrent or metastatic disease.        INTERVAL HISTORY:  Natasha Graham is here for a follow up  of  metastatic breast cancer . She was last seen by me on 09/09/2022. She presents to the clinic alone. Pt stated that she was change from [place] to [employer] since 10/2022. Pt state that she has had no diarrhea, her stomach cramping has gotten better. Pt state that she has hip pain as well.  Pt sated that the last injection was very painful in her left hip.     All other systems were reviewed with the patient and are negative.  MEDICAL HISTORY:  Past Medical History:  Diagnosis Date   Cancer [facility])    breast cancer left 2021 roght 2015   Family history of brain cancer    Family history of breast cancer    Family history of colon cancer    Family history of stomach cancer    History of radiation therapy last done February 06 2020   Hypertension    PMB (postmenopausal bleeding)    PONV (postoperative nausea and vomiting)    likes scopolamine patch   Scoliosis    Wears glasses    Wears glasses     SURGICAL HISTORY: Past Surgical History:  Procedure Laterality Date   AUGMENTATION MAMMAPLASTY Right    2015 post mastectomy   AXILLARY LYMPH NODE DISSECTION Left 11/13/2019   Procedure: LEFT AXILLARY LYMPH NODE DISSECTION;  Surgeon: Emelia Loron, MD;  Location: [facility];  Service: General;  Laterality: Left;   BREAST IMPLANT EXCHANGE Right 03/19/2021   Procedure: exchange of right saline implant;  Surgeon: Peggye Form, DO;  Location: [facility];  Service: Plastics;  Laterality: Right;   BREAST RECONSTRUCTION WITH PLACEMENT OF TISSUE EXPANDER AND FLEX HD (ACELLULAR HYDRATED DERMIS) Right 05/16/2014   Procedure: IMMEDIATE RIGHT BREAST RECONSTRUCTION WITH PLACEMENT OF TISSUE EXPANDER AND FLEX HD (ACELLULAR HYDRATED DERMIS);  Surgeon: Wayland Denis, DO;  Location: [facility];  Service: Plastics;  Laterality: Right;   BREAST RECONSTRUCTION WITH PLACEMENT OF TISSUE EXPANDER AND FLEX HD (ACELLULAR HYDRATED DERMIS) Left 09/27/2019   Procedure: LEFT BREAST  RECONSTRUCTION WITH PLACEMENT OF TISSUE EXPANDER AND FLEX HD (ACELLULAR HYDRATED DERMIS);  Surgeon: Peggye Form, DO;  Location: [facility];  Service: Plastics;  Laterality: Left;   BREAST SURGERY     right breast excisional biopsy   DILATATION & CURETTAGE/HYSTEROSCOPY WITH MYOSURE N/A 12/20/2020   Procedure: DILATATION & CURETTAGE/HYSTEROSCOPY WITH  MYOSURE;  Surgeon: Natasha Mackie, MD;  Location: [facility];  Service: Gynecology;  Laterality: N/A;   DILATION AND CURETTAGE OF UTERUS     HYSTEROSCOPY WITH D & C N/A 09/20/2015   Procedure: DILATATION AND CURETTAGE /HYSTEROSCOPY;  Surgeon: Natasha Mackie, MD;  Location: [facility];  Service: Gynecology;  Laterality: N/A;   LIPOSUCTION Bilateral 08/08/2014   Procedure: LIPO SUCTION ;  Surgeon: Wayland Denis, DO;  Location: [facility];  Service: Plastics;  Laterality: Bilateral;   MASTECTOMY Right 05/16/2014   placement of acellular dermal matrix & tissue expanders    MASTOPEXY Left 08/08/2014   Procedure:  MASTOPEXY FOR SYMMETRY;  Surgeon: Wayland Denis, DO;  Location: [facility];  Service: Plastics;  Laterality: Left;   NIPPLE SPARING MASTECTOMY WITH SENTINEL LYMPH NODE BIOPSY Left 09/27/2019   Procedure: LEFT NIPPLE SPARING MASTECTOMY WITH LEFT AXILLARY SENTINEL LYMPH NODE BIOPSY;  Surgeon: Emelia Loron, MD;  Location: [facility];  Service: General;  Laterality: Left;   REDUCTION MAMMAPLASTY Left    2015   REMOVAL OF TISSUE EXPANDER AND PLACEMENT OF IMPLANT Right 08/08/2014   Procedure: REMOVAL OF RIGHT  TISSUE EXPANDERS WITH PLACEMENT OF RIGHT BREAST IMPLANTS WITH LIPO SUCTION ;  Surgeon: Wayland Denis, DO;  Location: [facility];  Service: Plastics;  Laterality: Right;   REMOVAL OF TISSUE EXPANDER AND PLACEMENT OF IMPLANT Left 03/19/2021   Procedure: Removal of left expander for saline implant;  Surgeon: Peggye Form, DO;  Location: [facility];  Service: Plastics;  Laterality: Left;  2 hours   ROBOTIC ASSISTED TOTAL HYSTERECTOMY WITH BILATERAL SALPINGO OOPHERECTOMY Bilateral 02/06/2021   Procedure: XI ROBOTIC ASSISTED TOTAL HYSTERECTOMY WITH BILATERAL SALPINGO OOPHORECTOMY, LYSIS of SIGMOID ADHESIONS MCCALL CUL DE PLASTY;  Surgeon: Natasha Mackie, MD;  Location: [facility];  Service: Gynecology;  Laterality: Bilateral;   SCAR REVISION Left 11/13/2019   Procedure: EXCISION OF LEFT MASTECTOMY SKIN;  Surgeon: Emelia Loron, MD;  Location:  SURGERY CENTER;  Service: General;  Laterality: Left;   scoliosis  1972   harrington rods-age 16   TONSILLECTOMY  as child    I have reviewed the social history and family history with the patient and they are unchanged from previous note.  ALLERGIES:  has No Known Allergies.  MEDICATIONS:  Current Outpatient Medications  Medication Sig Dispense Refill   abemaciclib (VERZENIO) 100 MG tablet Take 1 tablet (100 mg total) by mouth 2 (two) times daily. 56 tablet 2   acetaminophen (TYLENOL) 500 MG tablet Take 500 mg by mouth.     Ascorbic Acid (VITAMIN C) 1000 MG tablet Take 100 mg  by mouth daily. 1000 mg     Boswellia-Glucosamine-Vit D (OSTEO BI-FLEX ONE PER DAY PO) Take by mouth.     Calcium Carb-Cholecalciferol (CALCIUM PLUS VITAMIN D3) 600-20 MG-MCG TABS Take by mouth 2 (two) times daily.     clotrimazole-betamethasone (LOTRISONE) cream Apply 1 Application topically 2 (two) times daily. 30 g 0   Coenzyme Q10 (COQ10) 200 MG CAPS Take 200 mg by mouth daily.     denosumab (XGEVA) 120 MG/1.7ML SOLN injection Inject 120 mg into the skin once. Monthly     Flaxseed Oil (LINSEED OIL) OIL 1,200 mg daily.     Fulvestrant (FASLODEX IM) Inject into the muscle. Shot every 4 weeks.     Ginger, Zingiber officinalis, (GINGER PO) Take 550 mg by mouth.     ibuprofen (ADVIL) 200 MG tablet Take by mouth. Takes 2 of 200 mg     LISINOPRIL-HYDROCHLOROTHIAZIDE PO Take 1  tablet by mouth daily at 2 PM. 30/25 mg     Magnesium 400 MG TABS Take 400 mg by mouth daily.     Multiple Vitamin (MULTIVITAMIN) capsule Take by mouth.     OVER THE COUNTER MEDICATION Calcium 1200mg -Take daily.     OVER THE COUNTER MEDICATION Trubiotics-Take daily.     potassium chloride SA (KLOR-CON M) 20 MEQ tablet Take 1 tablet (20 mEq total) by mouth daily. (Patient taking differently: Take 99 tablets by mouth 2 (two) times daily. OTC  99 mg) 4 tablet 0   Turmeric 500 MG CAPS Take 500 mg by mouth 2 (two) times daily.     No current facility-administered medications for this visit.   Facility-Administered Medications Ordered in Other Visits  Medication Dose Route Frequency Provider Last Rate Last Admin   denosumab (XGEVA) injection 120 mg  120 mg Subcutaneous Once Heilingoetter, Cassandra L, PA-C       fulvestrant (FASLODEX) injection 500 mg  500 mg Intramuscular Once Natasha Mood, MD        PHYSICAL EXAMINATION: ECOG PERFORMANCE STATUS: 1 - Symptomatic but completely ambulatory  Vitals:   12/30/22 0819  BP: (!) 168/83  Pulse: 95  Resp: 16  Temp: 98.2 F (36.8 C)  SpO2: 100%   Wt Readings from Last 3 Encounters:  12/30/22 169 lb 12.8 oz (77 kg)  09/09/22 171 lb (77.6 kg)  06/17/22 170 lb (77.1 kg)     GENERAL:alert, no distress and comfortable SKIN: skin color normal, no rashes or significant lesions EYES: normal, Conjunctiva are pink and non-injected, sclera clear  NEURO: alert & oriented x 3 with fluent speech   LABORATORY DATA:  I have reviewed the data as listed    Latest Ref Rng & Units 12/30/2022    8:02 AM 11/04/2022    8:07 AM 09/09/2022    8:41 AM  CBC  WBC 4.0 - 10.5 K/uL 3.1  3.2  2.2   Hemoglobin 12.0 - 15.0 g/dL 11.9  14.7  82.9   Hematocrit 36.0 - 46.0 % 35.1  33.1  33.0   Platelets 150 - 400 K/uL 259  271  222         Latest Ref Rng & Units 12/30/2022    8:02 AM 11/04/2022    8:07 AM 09/09/2022    8:41 AM  CMP  Glucose 70 - 99 mg/dL 562  130  865    BUN 8 - 23 mg/dL 10  12  12    Creatinine 0.44 - 1.00 mg/dL 7.84  6.96  2.95   Sodium 135 -  145 mmol/L 142  140  140   Potassium 3.5 - 5.1 mmol/L 3.6  3.6  3.6   Chloride 98 - 111 mmol/L 105  102  102   CO2 22 - 32 mmol/L 30  28  31    Calcium 8.9 - 10.3 mg/dL 9.2  9.4  9.3   Total Protein 6.5 - 8.1 g/dL 6.8  6.9  6.8   Total Bilirubin 0.3 - 1.2 mg/dL 0.5  0.4  0.5   Alkaline Phos 38 - 126 U/L 44  47  38   AST 15 - 41 U/L 28  18  15    ALT 0 - 44 U/L 33  22  16       RADIOGRAPHIC STUDIES: I have personally reviewed the radiological images as listed and agreed with the findings in the report. No results found.    Orders Placed This Encounter  Procedures   Cancer antigen 27.29    Standing Status:   Standing    Number of Occurrences:   20    Standing Expiration Date:   12/30/2023   All questions were answered. The patient knows to call the clinic with any problems, questions or concerns. No barriers to learning was detected. The total time spent in the appointment was 30 minutes.     Natasha Mood, MD 12/30/2022   Carolin Coy, CMA, am acting as scribe for Natasha Mood, MD.   I have reviewed the above documentation for accuracy and completeness, and I agree with the above.

## 2022-12-30 NOTE — Addendum Note (Signed)
Addended by: Malachy Mood on: 12/30/2022 10:09 AM   Modules accepted: Orders

## 2023-01-01 DIAGNOSIS — I700 Atherosclerosis of aorta: Secondary | ICD-10-CM | POA: Diagnosis not present

## 2023-01-01 DIAGNOSIS — E7800 Pure hypercholesterolemia, unspecified: Secondary | ICD-10-CM | POA: Diagnosis not present

## 2023-01-01 DIAGNOSIS — I10 Essential (primary) hypertension: Secondary | ICD-10-CM | POA: Diagnosis not present

## 2023-01-01 DIAGNOSIS — K760 Fatty (change of) liver, not elsewhere classified: Secondary | ICD-10-CM | POA: Diagnosis not present

## 2023-01-21 ENCOUNTER — Other Ambulatory Visit (HOSPITAL_COMMUNITY): Payer: Self-pay

## 2023-01-22 ENCOUNTER — Other Ambulatory Visit (HOSPITAL_COMMUNITY): Payer: Self-pay

## 2023-01-27 ENCOUNTER — Other Ambulatory Visit (HOSPITAL_COMMUNITY): Payer: Self-pay

## 2023-01-27 ENCOUNTER — Inpatient Hospital Stay: Payer: BC Managed Care – PPO

## 2023-01-27 VITALS — BP 150/77 | HR 88 | Temp 98.7°F | Resp 16

## 2023-01-27 DIAGNOSIS — Z79899 Other long term (current) drug therapy: Secondary | ICD-10-CM | POA: Diagnosis not present

## 2023-01-27 DIAGNOSIS — C7951 Secondary malignant neoplasm of bone: Secondary | ICD-10-CM

## 2023-01-27 DIAGNOSIS — Z9012 Acquired absence of left breast and nipple: Secondary | ICD-10-CM | POA: Diagnosis not present

## 2023-01-27 DIAGNOSIS — Z5111 Encounter for antineoplastic chemotherapy: Secondary | ICD-10-CM | POA: Diagnosis not present

## 2023-01-27 DIAGNOSIS — Z170 Estrogen receptor positive status [ER+]: Secondary | ICD-10-CM | POA: Diagnosis not present

## 2023-01-27 DIAGNOSIS — C50512 Malignant neoplasm of lower-outer quadrant of left female breast: Secondary | ICD-10-CM | POA: Diagnosis not present

## 2023-01-27 MED ORDER — FULVESTRANT 250 MG/5ML IM SOSY
500.0000 mg | PREFILLED_SYRINGE | Freq: Once | INTRAMUSCULAR | Status: AC
  Administered 2023-01-27: 500 mg via INTRAMUSCULAR
  Filled 2023-01-27: qty 10

## 2023-01-27 MED ORDER — DENOSUMAB 120 MG/1.7ML ~~LOC~~ SOLN: 120.0000 mg | Freq: Once | SUBCUTANEOUS | Status: DC

## 2023-02-17 ENCOUNTER — Other Ambulatory Visit (HOSPITAL_COMMUNITY): Payer: Self-pay

## 2023-02-18 ENCOUNTER — Other Ambulatory Visit (HOSPITAL_COMMUNITY): Payer: Self-pay

## 2023-02-18 ENCOUNTER — Other Ambulatory Visit: Payer: Self-pay

## 2023-02-19 ENCOUNTER — Other Ambulatory Visit: Payer: Self-pay

## 2023-02-22 DIAGNOSIS — K439 Ventral hernia without obstruction or gangrene: Secondary | ICD-10-CM | POA: Diagnosis not present

## 2023-02-24 ENCOUNTER — Other Ambulatory Visit (HOSPITAL_COMMUNITY): Payer: Self-pay

## 2023-02-24 ENCOUNTER — Other Ambulatory Visit: Payer: Self-pay | Admitting: Nurse Practitioner

## 2023-02-24 ENCOUNTER — Inpatient Hospital Stay: Payer: BC Managed Care – PPO | Attending: Hematology

## 2023-02-24 ENCOUNTER — Other Ambulatory Visit: Payer: Self-pay

## 2023-02-24 VITALS — BP 172/85 | HR 77 | Temp 98.9°F | Resp 18

## 2023-02-24 DIAGNOSIS — C7951 Secondary malignant neoplasm of bone: Secondary | ICD-10-CM | POA: Diagnosis not present

## 2023-02-24 DIAGNOSIS — C50512 Malignant neoplasm of lower-outer quadrant of left female breast: Secondary | ICD-10-CM | POA: Diagnosis not present

## 2023-02-24 DIAGNOSIS — Z170 Estrogen receptor positive status [ER+]: Secondary | ICD-10-CM | POA: Diagnosis not present

## 2023-02-24 DIAGNOSIS — Z5111 Encounter for antineoplastic chemotherapy: Secondary | ICD-10-CM | POA: Insufficient documentation

## 2023-02-24 MED ORDER — FULVESTRANT 250 MG/5ML IM SOSY
500.0000 mg | PREFILLED_SYRINGE | Freq: Once | INTRAMUSCULAR | Status: AC
  Administered 2023-02-24: 500 mg via INTRAMUSCULAR
  Filled 2023-02-24: qty 10

## 2023-03-17 ENCOUNTER — Other Ambulatory Visit: Payer: Self-pay

## 2023-03-17 ENCOUNTER — Other Ambulatory Visit: Payer: Self-pay | Admitting: Hematology

## 2023-03-17 ENCOUNTER — Other Ambulatory Visit (HOSPITAL_COMMUNITY): Payer: Self-pay

## 2023-03-17 MED ORDER — ABEMACICLIB 100 MG PO TABS
100.0000 mg | ORAL_TABLET | Freq: Two times a day (BID) | ORAL | 2 refills | Status: DC
  Filled 2023-03-17: qty 56, 28d supply, fill #0
  Filled 2023-05-05: qty 56, 28d supply, fill #1
  Filled 2023-06-08: qty 56, 28d supply, fill #2

## 2023-03-19 ENCOUNTER — Other Ambulatory Visit (HOSPITAL_COMMUNITY): Payer: Self-pay

## 2023-03-23 NOTE — Progress Notes (Unsigned)
[facility]   [phone] [phone]   Clinic Follow up Note   Patient Care Team: Hamrick, Durward Fortes, MD as PCP - General (Family Medicine) Emelia Loron, MD as Consulting Physician (General Surgery) Pershing Proud, RN as Registered Nurse Donnelly Angelica, RN as Registered Nurse Dillingham, Alena Bills, DO as Attending Physician (Plastic Surgery) Antony Blackbird, MD as Consulting Physician (Radiation Oncology) Olivia Mackie, MD as Consulting Physician (Obstetrics and Gynecology) Anselm Lis, RPH-CPP (Pharmacist) Malachy Mood, MD as Consulting Physician (Medical Oncology)  Date of Service:  03/24/2023  CHIEF COMPLAINT: f/u of  metastatic breast cancer     CURRENT THERAPY:  -denosumab and fulvestrant q4weeks, started 08/15/20, change Xgeva to every 3 months in 12/2022 Verzenio 100 mg orally  2 times daily started 10/2022  ASSESSMENT:  Natasha Graham is a 62 y.o. female with   Malignant neoplasm metastatic to bone [facility]) Metastatic Breast Cancer to bones, lobular carcinoma, ER+/PR+, HER2 IHC 2+ (low expression) -left breast cancer initially diagnosed 07/12/19. S/p left mastectomy on 09/27/19 showed stage IIIA p(T3, N1) ILC, SLND on 11/13/19 was negative (0/7). Mammaprint was low risk. -she received postmastectomy radiation 12/26/19 - 02/06/20 and took tamoxifen 02/29/20 - 07/2020 -genetics 06/27/20 were negative. -she was found to have bony metastatic disease in 07/2020, confirmed with bone biopsy 08/06/20. -she began Xgeva and fulvestrant on 08/15/20 and palbociclib on 09/12/20. She tolerates well overall. -FoundationOne testing obtained on her 07/2020 biopsy showed no targetable mutation.  -PET scan on 12/22/2022 showed treated diffuse bone mets, no hypermetabolic uptake  -Due to high co-pay, Ilda Foil was switched to [employer] in February 2024, she is tolerating well with low-dose 100 mg twice daily, with occasional diarrhea. -continue Xgeva every 3  months  -Patient again wanted to change fulvestrant injection to oral antiestrogen therapy, I will obtain Guardant360, to rule out a ESR 1 mutation, if that is negative, we will switch her to anastrozole. -Is agreeable to continue Verzenio, she is tolerating well. -She would like to have surgery to repair her abdominal hernia, she is scheduled to see her breast surgeon Dr. Dwain Sarna.  If she does go on surgery, I will hold on Verzenio for a few weeks around her surgery.    PLAN: -lab reviewed -Order Guardant 360 test/lab today - proceed with Injection today -lab/flush and PET can in 3 months   SUMMARY OF ONCOLOGIC HISTORY: Oncology History Overview Note   Cancer Staging  Malignant neoplasm metastatic to bone [facility]) Staging form: Bone - Appendicular Skeleton, Trunk, Skull, and Facial Bones, AJCC 8th Edition - Clinical: Stage IVB (cT3, cN1, cM1b) - Signed by Lowella Dell, MD on 08/15/2020  Malignant neoplasm of lower-outer quadrant of left breast of female, estrogen receptor positive (HCC) Staging form: Breast, AJCC 8th Edition - Clinical: Stage IIIA (cT3, cN1, cM0, G2, ER+, PR-, HER2-) - Signed by Lowella Dell, MD on 08/15/2020     Breast neoplasm, Tis (DCIS), right (Resolved)  05/16/2014 Initial Diagnosis   Breast neoplasm, Tis (DCIS), right   06/27/2020 Genetic Testing   Negative genetic testing on the CancerNext-Expanded+RNAinsight panel testing.  The CancerNext-Expanded gene panel offered by [facility] and includes sequencing and rearrangement analysis for the following 77 genes: AIP, ALK, APC*, ATM*, AXIN2, BAP1, BARD1, BLM, BMPR1A, BRCA1*, BRCA2*, BRIP1*, CDC73, CDH1*, CDK4, CDKN1B, CDKN2A, CHEK2*, CTNNA1, DICER1, FANCC, FH, FLCN, GALNT12, KIF1B, LZTR1, MAX, MEN1, MET, MLH1*, MSH2*, MSH3, MSH6*, MUTYH*, NBN, NF1*, NF2, NTHL1, PALB2*, PHOX2B, PMS2*, POT1, PRKAR1A, PTCH1, PTEN*, RAD51C*,  RAD51D*, RB1, RECQL, RET, SDHA, SDHAF2, SDHB, SDHC, SDHD, SMAD4, SMARCA4,  SMARCB1, SMARCE1, STK11, SUFU, TMEM127, TP53*, TSC1, TSC2, VHL and XRCC2 (sequencing and deletion/duplication); EGFR, EGLN1, HOXB13, KIT, MITF, PDGFRA, POLD1, and POLE (sequencing only); EPCAM and GREM1 (deletion/duplication only). DNA and RNA analyses performed for * genes. The report date is 06/27/2020.   Malignant neoplasm of lower-outer quadrant of left breast of female, estrogen receptor positive (HCC)  10/18/2019 Initial Diagnosis   Malignant neoplasm of lower-outer quadrant of left breast of female, estrogen receptor positive (HCC)   08/15/2020 Cancer Staging   Staging form: Breast, AJCC 8th Edition - Clinical: Stage IIIA (cT3, cN1, cM0, G2, ER+, PR-, HER2-) - Signed by Lowella Dell, MD on 08/15/2020   Malignant neoplasm metastatic to bone (HCC)  08/05/2020 Initial Diagnosis   Bone metastases (HCC)   08/15/2020 Cancer Staging   Staging form: Bone - Appendicular Skeleton, Trunk, Skull, and Facial Bones, AJCC 8th Edition - Clinical: Stage IVB (cT3, cN1, cM1b) - Signed by Lowella Dell, MD on 08/15/2020   12/21/2022 PET scan    IMPRESSION: Status post bilateral breast reconstruction with left axillary lymph node dissection.   Multifocal/diffuse treated osseous metastases, as above.   Otherwise, no evidence of recurrent or metastatic disease.        INTERVAL HISTORY:  Natasha Graham is here for a follow up of  metastatic breast cancer . She was last seen by me on 12/30/2022. She presents to the clinic alone. Pt state that she is doing well. Pt state that she has no issues with the fulvestrant injection, but was wondering if there was a pill form. Pt state that being on the Verzenio has flared  up her diverticulitis. Pt sate that she has some fatigue      All other systems were reviewed with the patient and are negative.  MEDICAL HISTORY:  Past Medical History:  Diagnosis Date   Cancer [facility])    breast cancer left 2021 roght 2015   Family history of brain cancer     Family history of breast cancer    Family history of colon cancer    Family history of stomach cancer    History of radiation therapy last done February 06 2020   Hypertension    PMB (postmenopausal bleeding)    PONV (postoperative nausea and vomiting)    likes scopolamine patch   Scoliosis    Wears glasses    Wears glasses     SURGICAL HISTORY: Past Surgical History:  Procedure Laterality Date   AUGMENTATION MAMMAPLASTY Right    2015 post mastectomy   AXILLARY LYMPH NODE DISSECTION Left 11/13/2019   Procedure: LEFT AXILLARY LYMPH NODE DISSECTION;  Surgeon: Emelia Loron, MD;  Location: [facility];  Service: General;  Laterality: Left;   BREAST IMPLANT EXCHANGE Right 03/19/2021   Procedure: exchange of right saline implant;  Surgeon: Peggye Form, DO;  Location: [facility];  Service: Plastics;  Laterality: Right;   BREAST RECONSTRUCTION WITH PLACEMENT OF TISSUE EXPANDER AND FLEX HD (ACELLULAR HYDRATED DERMIS) Right 05/16/2014   Procedure: IMMEDIATE RIGHT BREAST RECONSTRUCTION WITH PLACEMENT OF TISSUE EXPANDER AND FLEX HD (ACELLULAR HYDRATED DERMIS);  Surgeon: Wayland Denis, DO;  Location: [facility];  Service: Plastics;  Laterality: Right;   BREAST RECONSTRUCTION WITH PLACEMENT OF TISSUE EXPANDER AND FLEX HD (ACELLULAR HYDRATED DERMIS) Left 09/27/2019   Procedure: LEFT BREAST RECONSTRUCTION WITH PLACEMENT OF TISSUE EXPANDER AND FLEX HD (ACELLULAR HYDRATED DERMIS);  Surgeon: Peggye Form,  DO;  Location: [facility];  Service: Plastics;  Laterality: Left;   BREAST SURGERY     right breast excisional biopsy   DILATATION & CURETTAGE/HYSTEROSCOPY WITH MYOSURE N/A 12/20/2020   Procedure: DILATATION & CURETTAGE/HYSTEROSCOPY WITH  MYOSURE;  Surgeon: Olivia Mackie, MD;  Location: [facility];  Service: Gynecology;  Laterality: N/A;   DILATION AND CURETTAGE OF UTERUS     HYSTEROSCOPY WITH D & C N/A 09/20/2015   Procedure:  DILATATION AND CURETTAGE /HYSTEROSCOPY;  Surgeon: Olivia Mackie, MD;  Location: [facility];  Service: Gynecology;  Laterality: N/A;   LIPOSUCTION Bilateral 08/08/2014   Procedure: LIPO SUCTION ;  Surgeon: Wayland Denis, DO;  Location: [facility];  Service: Plastics;  Laterality: Bilateral;   MASTECTOMY Right 05/16/2014   placement of acellular dermal matrix & tissue expanders    MASTOPEXY Left 08/08/2014   Procedure:  MASTOPEXY FOR SYMMETRY;  Surgeon: Wayland Denis, DO;  Location: [facility];  Service: Plastics;  Laterality: Left;   NIPPLE SPARING MASTECTOMY WITH SENTINEL LYMPH NODE BIOPSY Left 09/27/2019   Procedure: LEFT NIPPLE SPARING MASTECTOMY WITH LEFT AXILLARY SENTINEL LYMPH NODE BIOPSY;  Surgeon: Emelia Loron, MD;  Location: [facility];  Service: General;  Laterality: Left;   REDUCTION MAMMAPLASTY Left    2015   REMOVAL OF TISSUE EXPANDER AND PLACEMENT OF IMPLANT Right 08/08/2014   Procedure: REMOVAL OF RIGHT  TISSUE EXPANDERS WITH PLACEMENT OF RIGHT BREAST IMPLANTS WITH LIPO SUCTION ;  Surgeon: Wayland Denis, DO;  Location: [facility];  Service: Plastics;  Laterality: Right;   REMOVAL OF TISSUE EXPANDER AND PLACEMENT OF IMPLANT Left 03/19/2021   Procedure: Removal of left expander for saline implant;  Surgeon: Peggye Form, DO;  Location: [facility];  Service: Plastics;  Laterality: Left;  2 hours   ROBOTIC ASSISTED TOTAL HYSTERECTOMY WITH BILATERAL SALPINGO OOPHERECTOMY Bilateral 02/06/2021   Procedure: XI ROBOTIC ASSISTED TOTAL HYSTERECTOMY WITH BILATERAL SALPINGO OOPHORECTOMY, LYSIS of SIGMOID ADHESIONS MCCALL CUL DE PLASTY;  Surgeon: Olivia Mackie, MD;  Location: [facility];  Service: Gynecology;  Laterality: Bilateral;   SCAR REVISION Left 11/13/2019   Procedure: EXCISION OF LEFT MASTECTOMY SKIN;  Surgeon: Emelia Loron, MD;  Location: [facility];  Service: General;   Laterality: Left;   scoliosis  1972   harrington rods-age 59   TONSILLECTOMY  as child    I have reviewed the social history and family history with the patient and they are unchanged from previous note.  ALLERGIES:  has No Known Allergies.  MEDICATIONS:  Current Outpatient Medications  Medication Sig Dispense Refill   abemaciclib (VERZENIO) 100 MG tablet Take 1 tablet (100 mg total) by mouth 2 (two) times daily. 56 tablet 2   acetaminophen (TYLENOL) 500 MG tablet Take 500 mg by mouth.     Ascorbic Acid (VITAMIN C) 1000 MG tablet Take 100 mg by mouth daily. 1000 mg     Boswellia-Glucosamine-Vit D (OSTEO BI-FLEX ONE PER DAY PO) Take by mouth.     Calcium Carb-Cholecalciferol (CALCIUM PLUS VITAMIN D3) 600-20 MG-MCG TABS Take by mouth 2 (two) times daily.     clotrimazole-betamethasone (LOTRISONE) cream Apply 1 Application topically 2 (two) times daily. 30 g 0   Coenzyme Q10 (COQ10) 200 MG CAPS Take 200 mg by mouth daily.     denosumab (XGEVA) 120 MG/1.7ML SOLN injection Inject 120 mg into the skin once. Monthly     Flaxseed Oil (LINSEED OIL)  OIL 1,200 mg daily.     Fulvestrant (FASLODEX IM) Inject into the muscle. Shot every 4 weeks.     Ginger, Zingiber officinalis, (GINGER PO) Take 550 mg by mouth.     ibuprofen (ADVIL) 200 MG tablet Take by mouth. Takes 2 of 200 mg     LISINOPRIL-HYDROCHLOROTHIAZIDE PO Take 1 tablet by mouth daily at 2 PM. 30/25 mg     Magnesium 400 MG TABS Take 400 mg by mouth daily.     Multiple Vitamin (MULTIVITAMIN) capsule Take by mouth.     OVER THE COUNTER MEDICATION Calcium 1200mg -Take daily.     OVER THE COUNTER MEDICATION Trubiotics-Take daily.     potassium chloride SA (KLOR-CON M) 20 MEQ tablet Take 1 tablet (20 mEq total) by mouth daily. (Patient taking differently: Take 99 tablets by mouth 2 (two) times daily. OTC  99 mg) 4 tablet 0   Turmeric 500 MG CAPS Take 500 mg by mouth 2 (two) times daily.     No current facility-administered medications for  this visit.    PHYSICAL EXAMINATION: ECOG PERFORMANCE STATUS: 1 - Symptomatic but completely ambulatory  Vitals:   03/24/23 0824  BP: (!) 152/85  Pulse: 95  Resp: 18  Temp: 98.5 F (36.9 C)  SpO2: 99%   Wt Readings from Last 3 Encounters:  03/24/23 168 lb 3.2 oz (76.3 kg)  12/30/22 169 lb 12.8 oz (77 kg)  09/09/22 171 lb (77.6 kg)     GENERAL:alert, no distress and comfortable SKIN: skin color normal, no rashes or significant lesions EYES: normal, Conjunctiva are pink and non-injected, sclera clear  NEURO: alert & oriented x 3 with fluent speech    LABORATORY DATA:  I have reviewed the data as listed    Latest Ref Rng & Units 03/24/2023    7:53 AM 12/30/2022    8:02 AM 11/04/2022    8:07 AM  CBC  WBC 4.0 - 10.5 K/uL 2.7  3.1  3.2   Hemoglobin 12.0 - 15.0 g/dL 64.4  03.4  74.2   Hematocrit 36.0 - 46.0 % 33.7  35.1  33.1   Platelets 150 - 400 K/uL 215  259  271         Latest Ref Rng & Units 03/24/2023    7:53 AM 12/30/2022    8:02 AM 11/04/2022    8:07 AM  CMP  Glucose 70 - 99 mg/dL 81  595  638   BUN 8 - 23 mg/dL 12  10  12    Creatinine 0.44 - 1.00 mg/dL 7.56  4.33  2.95   Sodium 135 - 145 mmol/L 140  142  140   Potassium 3.5 - 5.1 mmol/L 3.4  3.6  3.6   Chloride 98 - 111 mmol/L 102  105  102   CO2 22 - 32 mmol/L 31  30  28    Calcium 8.9 - 10.3 mg/dL 9.6  9.2  9.4   Total Protein 6.5 - 8.1 g/dL 6.5  6.8  6.9   Total Bilirubin 0.3 - 1.2 mg/dL 0.5  0.5  0.4   Alkaline Phos 38 - 126 U/L 45  44  47   AST 15 - 41 U/L 23  28  18    ALT 0 - 44 U/L 27  33  22       RADIOGRAPHIC STUDIES: I have personally reviewed the radiological images as listed and agreed with the findings in the report. No results found.    Orders Placed This Encounter  Procedures   NM PET Image Initial (PI) Skull Base To Thigh    Standing Status:   Future    Standing Expiration Date:   03/21/2024    Order Specific Question:   If indicated for the ordered procedure, I authorize the  administration of a radiopharmaceutical per Radiology protocol    Answer:   Yes    Order Specific Question:   Preferred imaging location?    Answer:   [facility]    Order Specific Question:   Release to patient    Answer:   Immediate   Guardant 360    Standing Status:   Future    Number of Occurrences:   1    Standing Expiration Date:   03/23/2024   All questions were answered. The patient knows to call the clinic with any problems, questions or concerns. No barriers to learning was detected. The total time spent in the appointment was 30 minutes.     Malachy Mood, MD 03/24/2023   Carolin Coy, CMA, am acting as scribe for Malachy Mood, MD.   I have reviewed the above documentation for accuracy and completeness, and I agree with the above.

## 2023-03-23 NOTE — Assessment & Plan Note (Signed)
Metastatic Breast Cancer to bones, lobular carcinoma, ER+/PR+, HER2 IHC 2+ (low expression) -left breast cancer initially diagnosed 07/12/19. S/p left mastectomy on 09/27/19 showed stage IIIA p(T3, N1) ILC, SLND on 11/13/19 was negative (0/7). Mammaprint was low risk. -she received postmastectomy radiation 12/26/19 - 02/06/20 and took tamoxifen 02/29/20 - 07/2020 -genetics 06/27/20 were negative. -she was found to have bony metastatic disease in 07/2020, confirmed with bone biopsy 08/06/20. -she began Xgeva and fulvestrant on 08/15/20 and palbociclib on 09/12/20. She tolerates well overall. -FoundationOne testing obtained on her 07/2020 biopsy showed no targetable mutation.  -PET scan on 12/22/2022 showed treated diffuse bone mets, no hypermetabolic uptake  -Due to high co-pay, Ilda Foil was switched to [facility] in February 2024, she is tolerating well with low-dose 100 mg twice daily, with occasional diarrhea. -continue Xgeva every 3 months

## 2023-03-24 ENCOUNTER — Inpatient Hospital Stay: Payer: BC Managed Care – PPO

## 2023-03-24 ENCOUNTER — Inpatient Hospital Stay: Payer: BC Managed Care – PPO | Admitting: Hematology

## 2023-03-24 ENCOUNTER — Inpatient Hospital Stay: Payer: BC Managed Care – PPO | Attending: Hematology

## 2023-03-24 ENCOUNTER — Encounter: Payer: Self-pay | Admitting: Hematology

## 2023-03-24 ENCOUNTER — Other Ambulatory Visit: Payer: Self-pay

## 2023-03-24 ENCOUNTER — Other Ambulatory Visit (HOSPITAL_COMMUNITY): Payer: Self-pay

## 2023-03-24 VITALS — BP 171/89 | HR 75 | Temp 98.4°F | Resp 18

## 2023-03-24 VITALS — BP 152/85 | HR 95 | Temp 98.5°F | Resp 18 | Ht 64.0 in | Wt 168.2 lb

## 2023-03-24 DIAGNOSIS — C50512 Malignant neoplasm of lower-outer quadrant of left female breast: Secondary | ICD-10-CM | POA: Diagnosis not present

## 2023-03-24 DIAGNOSIS — Z923 Personal history of irradiation: Secondary | ICD-10-CM | POA: Diagnosis not present

## 2023-03-24 DIAGNOSIS — Z170 Estrogen receptor positive status [ER+]: Secondary | ICD-10-CM

## 2023-03-24 DIAGNOSIS — Z5111 Encounter for antineoplastic chemotherapy: Secondary | ICD-10-CM | POA: Diagnosis not present

## 2023-03-24 DIAGNOSIS — C7951 Secondary malignant neoplasm of bone: Secondary | ICD-10-CM

## 2023-03-24 DIAGNOSIS — Z79899 Other long term (current) drug therapy: Secondary | ICD-10-CM | POA: Diagnosis not present

## 2023-03-24 LAB — CMP (CANCER CENTER ONLY)
ALT: 27 U/L (ref 0–44)
AST: 23 U/L (ref 15–41)
Albumin: 4.1 g/dL (ref 3.5–5.0)
Alkaline Phosphatase: 45 U/L (ref 38–126)
Anion gap: 7 (ref 5–15)
BUN: 12 mg/dL (ref 8–23)
CO2: 31 mmol/L (ref 22–32)
Calcium: 9.6 mg/dL (ref 8.9–10.3)
Chloride: 102 mmol/L (ref 98–111)
Creatinine: 1.02 mg/dL — ABNORMAL HIGH (ref 0.44–1.00)
GFR, Estimated: >60 mL/min (ref >60)
Glucose, Bld: 81 mg/dL (ref 70–99)
Potassium: 3.4 mmol/L — ABNORMAL LOW (ref 3.5–5.1)
Sodium: 140 mmol/L (ref 135–145)
Total Bilirubin: 0.5 mg/dL (ref 0.3–1.2)
Total Protein: 6.5 g/dL (ref 6.5–8.1)

## 2023-03-24 LAB — CBC WITH DIFFERENTIAL (CANCER CENTER ONLY)
Abs Immature Granulocytes: 0.01 10*3/uL (ref 0.00–0.07)
Basophils Absolute: 0.1 10*3/uL (ref 0.0–0.1)
Basophils Relative: 2 %
Eosinophils Absolute: 0 10*3/uL (ref 0.0–0.5)
Eosinophils Relative: 2 %
HCT: 33.7 % — ABNORMAL LOW (ref 36.0–46.0)
Hemoglobin: 12.2 g/dL (ref 12.0–15.0)
Immature Granulocytes: 0 %
Lymphocytes Relative: 36 %
Lymphs Abs: 1 10*3/uL (ref 0.7–4.0)
MCH: 36.5 pg — ABNORMAL HIGH (ref 26.0–34.0)
MCHC: 36.2 g/dL — ABNORMAL HIGH (ref 30.0–36.0)
MCV: 100.9 fL — ABNORMAL HIGH (ref 80.0–100.0)
Monocytes Absolute: 0.3 10*3/uL (ref 0.1–1.0)
Monocytes Relative: 9 %
Neutro Abs: 1.4 10*3/uL — ABNORMAL LOW (ref 1.7–7.7)
Neutrophils Relative %: 51 %
Platelet Count: 215 10*3/uL (ref 150–400)
RBC: 3.34 MIL/uL — ABNORMAL LOW (ref 3.87–5.11)
RDW: 12 % (ref 11.5–15.5)
WBC Count: 2.7 10*3/uL — ABNORMAL LOW (ref 4.0–10.5)
nRBC: 0 % (ref 0.0–0.2)

## 2023-03-24 MED ORDER — FULVESTRANT 250 MG/5ML IM SOSY
500.0000 mg | PREFILLED_SYRINGE | Freq: Once | INTRAMUSCULAR | Status: AC
  Administered 2023-03-24: 500 mg via INTRAMUSCULAR
  Filled 2023-03-24: qty 10

## 2023-03-24 MED ORDER — DENOSUMAB 120 MG/1.7ML ~~LOC~~ SOLN
120.0000 mg | Freq: Once | SUBCUTANEOUS | Status: AC
  Administered 2023-03-24: 120 mg via SUBCUTANEOUS
  Filled 2023-03-24: qty 1.7

## 2023-03-25 LAB — CANCER ANTIGEN 27.29: CA 27.29: 30.8 U/mL (ref 0.0–38.6)

## 2023-03-30 DIAGNOSIS — C7951 Secondary malignant neoplasm of bone: Secondary | ICD-10-CM | POA: Diagnosis not present

## 2023-03-30 DIAGNOSIS — C50512 Malignant neoplasm of lower-outer quadrant of left female breast: Secondary | ICD-10-CM | POA: Diagnosis not present

## 2023-04-01 ENCOUNTER — Encounter: Payer: Self-pay | Admitting: Hematology

## 2023-04-05 DIAGNOSIS — K432 Incisional hernia without obstruction or gangrene: Secondary | ICD-10-CM | POA: Diagnosis not present

## 2023-04-06 ENCOUNTER — Other Ambulatory Visit (HOSPITAL_COMMUNITY): Payer: Self-pay | Admitting: General Surgery

## 2023-04-06 DIAGNOSIS — K432 Incisional hernia without obstruction or gangrene: Secondary | ICD-10-CM

## 2023-04-08 ENCOUNTER — Other Ambulatory Visit: Payer: Self-pay

## 2023-04-09 ENCOUNTER — Other Ambulatory Visit: Payer: Self-pay

## 2023-04-09 MED ORDER — ANASTROZOLE 1 MG PO TABS
1.0000 mg | ORAL_TABLET | Freq: Every day | ORAL | 1 refills | Status: AC
Start: 2023-04-09 — End: ?

## 2023-04-13 ENCOUNTER — Telehealth: Payer: Self-pay

## 2023-04-13 ENCOUNTER — Other Ambulatory Visit (HOSPITAL_COMMUNITY): Payer: Self-pay

## 2023-04-13 ENCOUNTER — Other Ambulatory Visit: Payer: Self-pay

## 2023-04-13 NOTE — Telephone Encounter (Signed)
Spoke with pt to inform pt that Dr. Mosetta Putt would like for the pt to stop the Verzenio 1 wk prior to her surgery on 04/27/2023 and she can restart Verzenio 2 wks after her surgery if there are no complications.  Informed pt that Dr. Mosetta Putt would like to see the pt back in the office 2 wks after her surgery.  Pt verbalized understanding and had no further questions or concerns.

## 2023-04-14 ENCOUNTER — Telehealth: Payer: Self-pay | Admitting: Hematology

## 2023-04-15 ENCOUNTER — Ambulatory Visit (HOSPITAL_COMMUNITY): Admission: RE | Admit: 2023-04-15 | Payer: BC Managed Care – PPO | Source: Ambulatory Visit

## 2023-04-16 NOTE — Progress Notes (Signed)
Surgical Instructions    Your procedure is scheduled on Tuesday August 27th.  Report to [facility] Main Entrance "A" at 5:30 A.M., then check in with the Admitting office.  Call this number if you have problems the morning of surgery:  [phone]   If you have any questions prior to your surgery date call [phone]: Open Monday-Friday 8am-4pm If you experience any cold or flu symptoms such as cough, fever, chills, shortness of breath, etc. between now and your scheduled surgery, please notify us at the above number     Remember:  Do not eat after midnight the night before your surgery  You may drink clear liquids until 4:30am the morning of your surgery.   Clear liquids allowed are: Water, Non-Citrus Juices (without pulp), Carbonated Beverages, Clear Tea, Black Coffee ONLY (NO MILK, CREAM OR POWDERED CREAMER of any kind), and Gatorade    Take these medicines the morning of surgery with A SIP OF WATER: abemaciclib (VERZENIO) 100 MG tablet  anastrozole (ARIMIDEX) 1 MG tablet  Potassium 99 MG TABS    As of today, STOP taking any Aspirin (unless otherwise instructed by your surgeon) Aleve, Naproxen, Ibuprofen, Motrin, Advil, Goody's, BC's, all herbal medications, fish oil, and all vitamins.           Do not wear jewelry or makeup. Do not wear lotions, powders, perfumes or deodorant. Do not shave 48 hours prior to surgery.   Do not bring valuables to the hospital. Do not wear nail polish, gel polish, artificial nails, or any other type of covering on natural nails (fingers and toes) If you have artificial nails or gel coating that need to be removed by a nail salon, please have this removed prior to surgery. Artificial nails or gel coating may interfere with anesthesia's ability to adequately monitor your vital signs.  [facility] is not responsible for any belongings or valuables.    Do NOT Smoke (Tobacco/Vaping)  24 hours prior to your procedure  If you use a CPAP at night,  you may bring your mask for your overnight stay.   Contacts, glasses, hearing aids, dentures or partials may not be worn into surgery, please bring cases for these belongings   For patients admitted to the hospital, discharge time will be determined by your treatment team.   Patients discharged the day of surgery will not be allowed to drive home, and someone needs to stay with them for 24 hours.   SURGICAL WAITING ROOM VISITATION Patients having surgery or a procedure may have no more than 2 support people in the waiting area - these visitors may rotate.   Children under the age of 6 must have an adult with them who is not the patient. If the patient needs to stay at the hospital during part of their recovery, the visitor guidelines for inpatient rooms apply. Pre-op nurse will coordinate an appropriate time for 1 support person to accompany patient in pre-op.  This support person may not rotate.   Please refer to [email or web address] for the visitor guidelines for Inpatients (after your surgery is over and you are in a regular room).    Special instructions:    Oral Hygiene is also important to reduce your risk of infection.  Remember - BRUSH YOUR TEETH THE MORNING OF SURGERY WITH YOUR REGULAR TOOTHPASTE   [facility]- Preparing For Surgery  Before surgery, you can play an important role. Because skin is not sterile, your skin needs to be as free of germs as  possible. You can reduce the number of germs on your skin by washing with CHG (chlorahexidine gluconate) Soap before surgery.  CHG is an antiseptic cleaner which kills germs and bonds with the skin to continue killing germs even after washing.     Please do not use if you have an allergy to CHG or antibacterial soaps. If your skin becomes reddened/irritated stop using the CHG.  Do not shave (including legs and underarms) for at least 48 hours prior to first CHG shower. It is OK to  shave your face.  Please follow these instructions carefully.     Shower the NIGHT BEFORE SURGERY and the MORNING OF SURGERY with CHG Soap.   If you chose to wash your hair, wash your hair first as usual with your normal shampoo. After you shampoo, rinse your hair and body thoroughly to remove the shampoo.  Then [employer] and genitals (private parts) with your normal soap and rinse thoroughly to remove soap.  After that Use CHG Soap as you would any other liquid soap. You can apply CHG directly to the skin and wash gently with a scrungie or a clean washcloth.   Apply the CHG Soap to your body ONLY FROM THE NECK DOWN.  Do not use on open wounds or open sores. Avoid contact with your eyes, ears, mouth and genitals (private parts). Wash Face and genitals (private parts)  with your normal soap.   Wash thoroughly, paying special attention to the area where your surgery will be performed.  Thoroughly rinse your body with warm water from the neck down.  DO NOT shower/wash with your normal soap after using and rinsing off the CHG Soap.  Pat yourself dry with a CLEAN TOWEL.  Wear CLEAN PAJAMAS to bed the night before surgery  Place CLEAN SHEETS on your bed the night before your surgery  DO NOT SLEEP WITH PETS.   Day of Surgery:  Take a shower with CHG soap. Wear Clean/Comfortable clothing the morning of surgery Do not apply any deodorants/lotions.   Remember to brush your teeth WITH YOUR REGULAR TOOTHPASTE.    If you received a COVID test during your pre-op visit, it is requested that you wear a mask when out in public, stay away from anyone that may not be feeling well, and notify your surgeon if you develop symptoms. If you have been in contact with anyone that has tested positive in the last 10 days, please notify your surgeon.    Please read over the following fact sheets that you were given.

## 2023-04-19 ENCOUNTER — Encounter (HOSPITAL_COMMUNITY)
Admission: RE | Admit: 2023-04-19 | Discharge: 2023-04-19 | Disposition: A | Discharge status: Home / Self Care | Payer: BC Managed Care – PPO | Source: Ambulatory Visit | Attending: General Surgery | Admitting: General Surgery

## 2023-04-19 ENCOUNTER — Encounter (HOSPITAL_COMMUNITY): Payer: Self-pay | Admitting: Urology

## 2023-04-19 ENCOUNTER — Other Ambulatory Visit: Payer: Self-pay | Admitting: General Surgery

## 2023-04-19 ENCOUNTER — Other Ambulatory Visit: Payer: Self-pay

## 2023-04-19 VITALS — BP 148/75 | HR 93 | Temp 98.8°F | Resp 18 | Ht 64.0 in | Wt 166.5 lb

## 2023-04-19 DIAGNOSIS — Z01818 Encounter for other preprocedural examination: Secondary | ICD-10-CM | POA: Diagnosis not present

## 2023-04-19 DIAGNOSIS — I10 Essential (primary) hypertension: Secondary | ICD-10-CM | POA: Diagnosis not present

## 2023-04-19 LAB — BASIC METABOLIC PANEL
Anion gap: 10 (ref 5–15)
BUN: 9 mg/dL (ref 8–23)
CO2: 27 mmol/L (ref 22–32)
Calcium: 8.9 mg/dL (ref 8.9–10.3)
Chloride: 100 mmol/L (ref 98–111)
Creatinine, Ser: 1.24 mg/dL — ABNORMAL HIGH (ref 0.44–1.00)
GFR, Estimated: 49 mL/min — ABNORMAL LOW (ref >60)
Glucose, Bld: 102 mg/dL — ABNORMAL HIGH (ref 70–99)
Potassium: 3.4 mmol/L — ABNORMAL LOW (ref 3.5–5.1)
Sodium: 137 mmol/L (ref 135–145)

## 2023-04-19 LAB — CBC
HCT: 33.1 % — ABNORMAL LOW (ref 36.0–46.0)
Hemoglobin: 11.6 g/dL — ABNORMAL LOW (ref 12.0–15.0)
MCH: 35.9 pg — ABNORMAL HIGH (ref 26.0–34.0)
MCHC: 35 g/dL (ref 30.0–36.0)
MCV: 102.5 fL — ABNORMAL HIGH (ref 80.0–100.0)
Platelets: 247 10*3/uL (ref 150–400)
RBC: 3.23 MIL/uL — ABNORMAL LOW (ref 3.87–5.11)
RDW: 12.3 % (ref 11.5–15.5)
WBC: 2.4 10*3/uL — ABNORMAL LOW (ref 4.0–10.5)
nRBC: 0 % (ref 0.0–0.2)

## 2023-04-19 NOTE — Progress Notes (Signed)
PCP - Hamrick, Durward Fortes, MD  Cardiologist - denies  PPM/ICD - denies   Chest x-ray - N/A EKG - 04/19/2023  Stress Test - denies ECHO - denies Cardiac Cath - denies  Sleep Study - denies   Fasting Blood Sugar - N/A  Last dose of GLP1 agonist- N/A     Blood Thinner Instructions: N/A Aspirin Instructions:N/A  Pt states her last dose of Verzenio was 04/18/23 and she is off of this for 3 weeks around surgery time. Pt reports this causes her WBC to decrease.   ERAS Protcol - ERAS per protocol- no pre-op orders   COVID TEST- N/A   Anesthesia review: review lab work. WBC 2.4.  Patient denies shortness of breath, fever, cough and chest pain at PAT appointment   All instructions explained to the patient, with a verbal understanding of the material. Patient agrees to go over the instructions while at home for a better understanding. The opportunity to ask questions was provided.

## 2023-04-20 NOTE — Progress Notes (Signed)
Surgical Instructions    Your procedure is scheduled on Tuesday August 27th.  Report to [facility] Main Entrance "A" at 5:30 A.M., then check in with the Admitting office.  Call this number if you have problems the morning of surgery:  [phone]   If you have any questions prior to your surgery date call [phone]: Open Monday-Friday 8am-4pm If you experience any cold or flu symptoms such as cough, fever, chills, shortness of breath, etc. between now and your scheduled surgery, please notify us at the above number     Remember:  Do not eat after midnight the night before your surgery  You may drink clear liquids until 4:30am the morning of your surgery.   Clear liquids allowed are: Water, Non-Citrus Juices (without pulp), Carbonated Beverages, Clear Tea, Black Coffee ONLY (NO MILK, CREAM OR POWDERED CREAMER of any kind), and Gatorade    Take these medicines the morning of surgery with A SIP OF WATER:  anastrozole (ARIMIDEX) 1 MG tablet      As of today, STOP taking any Aspirin (unless otherwise instructed by your surgeon) Aleve, Naproxen, Ibuprofen, Motrin, Advil, Goody's, BC's, all herbal medications, fish oil, and all vitamins.           Do not wear jewelry or makeup. Do not wear lotions, powders, perfumes or deodorant. Do not shave 48 hours prior to surgery.   Do not bring valuables to the hospital. Do not wear nail polish, gel polish, artificial nails, or any other type of covering on natural nails (fingers and toes) If you have artificial nails or gel coating that need to be removed by a nail salon, please have this removed prior to surgery. Artificial nails or gel coating may interfere with anesthesia's ability to adequately monitor your vital signs.  [facility] is not responsible for any belongings or valuables.    Do NOT Smoke (Tobacco/Vaping)  24 hours prior to your procedure  If you use a CPAP at night, you may bring your mask for your overnight stay.    Contacts, glasses, hearing aids, dentures or partials may not be worn into surgery, please bring cases for these belongings   For patients admitted to the hospital, discharge time will be determined by your treatment team.   Patients discharged the day of surgery will not be allowed to drive home, and someone needs to stay with them for 24 hours.   SURGICAL WAITING ROOM VISITATION Patients having surgery or a procedure may have no more than 2 support people in the waiting area - these visitors may rotate.   Children under the age of 46 must have an adult with them who is not the patient. If the patient needs to stay at the hospital during part of their recovery, the visitor guidelines for inpatient rooms apply. Pre-op nurse will coordinate an appropriate time for 1 support person to accompany patient in pre-op.  This support person may not rotate.   Please refer to [email or web address] for the visitor guidelines for Inpatients (after your surgery is over and you are in a regular room).    Special instructions:    Oral Hygiene is also important to reduce your risk of infection.  Remember - BRUSH YOUR TEETH THE MORNING OF SURGERY WITH YOUR REGULAR TOOTHPASTE   [place]- Preparing For Surgery  Before surgery, you can play an important role. Because skin is not sterile, your skin needs to be as free of germs as possible. You can reduce the number of germs  on your skin by washing with CHG (chlorahexidine gluconate) Soap before surgery.  CHG is an antiseptic cleaner which kills germs and bonds with the skin to continue killing germs even after washing.     Please do not use if you have an allergy to CHG or antibacterial soaps. If your skin becomes reddened/irritated stop using the CHG.  Do not shave (including legs and underarms) for at least 48 hours prior to first CHG shower. It is OK to shave your face.  Please follow these instructions  carefully.     Shower the NIGHT BEFORE SURGERY and the MORNING OF SURGERY with CHG Soap.   If you chose to wash your hair, wash your hair first as usual with your normal shampoo. After you shampoo, rinse your hair and body thoroughly to remove the shampoo.  Then [employer] and genitals (private parts) with your normal soap and rinse thoroughly to remove soap.  After that Use CHG Soap as you would any other liquid soap. You can apply CHG directly to the skin and wash gently with a scrungie or a clean washcloth.   Apply the CHG Soap to your body ONLY FROM THE NECK DOWN.  Do not use on open wounds or open sores. Avoid contact with your eyes, ears, mouth and genitals (private parts). Wash Face and genitals (private parts)  with your normal soap.   Wash thoroughly, paying special attention to the area where your surgery will be performed.  Thoroughly rinse your body with warm water from the neck down.  DO NOT shower/wash with your normal soap after using and rinsing off the CHG Soap.  Pat yourself dry with a CLEAN TOWEL.  Wear CLEAN PAJAMAS to bed the night before surgery  Place CLEAN SHEETS on your bed the night before your surgery  DO NOT SLEEP WITH PETS.   Day of Surgery:  Take a shower with CHG soap. Wear Clean/Comfortable clothing the morning of surgery Do not apply any deodorants/lotions.   Remember to brush your teeth WITH YOUR REGULAR TOOTHPASTE.    If you received a COVID test during your pre-op visit, it is requested that you wear a mask when out in public, stay away from anyone that may not be feeling well, and notify your surgeon if you develop symptoms. If you have been in contact with anyone that has tested positive in the last 10 days, please notify your surgeon.    Please read over the following fact sheets that you were given.

## 2023-04-21 ENCOUNTER — Inpatient Hospital Stay: Payer: BC Managed Care – PPO

## 2023-04-23 ENCOUNTER — Ambulatory Visit (HOSPITAL_COMMUNITY)
Admission: RE | Admit: 2023-04-23 | Discharge: 2023-04-23 | Disposition: A | Discharge status: Home / Self Care | Payer: BC Managed Care – PPO | Source: Ambulatory Visit | Attending: General Surgery | Admitting: General Surgery

## 2023-04-23 DIAGNOSIS — K432 Incisional hernia without obstruction or gangrene: Secondary | ICD-10-CM | POA: Insufficient documentation

## 2023-04-23 DIAGNOSIS — K760 Fatty (change of) liver, not elsewhere classified: Secondary | ICD-10-CM | POA: Diagnosis not present

## 2023-04-27 ENCOUNTER — Observation Stay (HOSPITAL_COMMUNITY)
Admission: RE | Admit: 2023-04-27 | Discharge: 2023-04-28 | Disposition: A | Discharge status: Home / Self Care | Payer: BC Managed Care – PPO | Attending: General Surgery | Admitting: General Surgery

## 2023-04-27 ENCOUNTER — Other Ambulatory Visit: Payer: Self-pay

## 2023-04-27 ENCOUNTER — Ambulatory Visit (HOSPITAL_COMMUNITY): Payer: BC Managed Care – PPO | Admitting: Anesthesiology

## 2023-04-27 ENCOUNTER — Encounter (HOSPITAL_COMMUNITY)
Admission: RE | Disposition: A | Discharge status: Home / Self Care | Payer: Self-pay | Source: Home / Self Care | Attending: General Surgery

## 2023-04-27 ENCOUNTER — Ambulatory Visit (HOSPITAL_COMMUNITY): Payer: BC Managed Care – PPO | Admitting: Physician Assistant

## 2023-04-27 ENCOUNTER — Encounter (HOSPITAL_COMMUNITY): Payer: Self-pay | Admitting: General Surgery

## 2023-04-27 DIAGNOSIS — Z79899 Other long term (current) drug therapy: Secondary | ICD-10-CM | POA: Insufficient documentation

## 2023-04-27 DIAGNOSIS — Z853 Personal history of malignant neoplasm of breast: Secondary | ICD-10-CM | POA: Insufficient documentation

## 2023-04-27 DIAGNOSIS — K432 Incisional hernia without obstruction or gangrene: Principal | ICD-10-CM | POA: Diagnosis present

## 2023-04-27 DIAGNOSIS — I10 Essential (primary) hypertension: Secondary | ICD-10-CM | POA: Insufficient documentation

## 2023-04-27 HISTORY — PX: INCISIONAL HERNIA REPAIR: SHX193

## 2023-04-27 SURGERY — REPAIR, HERNIA, INCISIONAL, LAPAROSCOPIC
Anesthesia: General | Site: Abdomen

## 2023-04-27 MED ORDER — CEFAZOLIN SODIUM-DEXTROSE 2-4 GM/100ML-% IV SOLN
2.0000 g | INTRAVENOUS | Status: DC
  Filled 2023-04-27: qty 100

## 2023-04-27 MED ORDER — LIDOCAINE 2% (20 MG/ML) 5 ML SYRINGE
INTRAMUSCULAR | Status: AC
  Filled 2023-04-27: qty 5

## 2023-04-27 MED ORDER — ENOXAPARIN SODIUM 40 MG/0.4ML IJ SOSY
40.0000 mg | PREFILLED_SYRINGE | INTRAMUSCULAR | Status: DC
  Administered 2023-04-28: 40 mg via SUBCUTANEOUS
  Filled 2023-04-27: qty 0.4

## 2023-04-27 MED ORDER — ROCURONIUM BROMIDE 10 MG/ML (PF) SYRINGE
PREFILLED_SYRINGE | INTRAVENOUS | Status: DC | PRN
  Administered 2023-04-27: 40 mg via INTRAVENOUS

## 2023-04-27 MED ORDER — ENSURE PRE-SURGERY PO LIQD: 296.0000 mL | Freq: Once | ORAL | Status: DC

## 2023-04-27 MED ORDER — METHOCARBAMOL 500 MG PO TABS
500.0000 mg | ORAL_TABLET | Freq: Three times a day (TID) | ORAL | Status: DC | PRN
  Administered 2023-04-27: 500 mg via ORAL

## 2023-04-27 MED ORDER — SCOPOLAMINE 1 MG/3DAYS TD PT72
MEDICATED_PATCH | TRANSDERMAL | Status: AC
  Filled 2023-04-27: qty 1

## 2023-04-27 MED ORDER — ORAL CARE MOUTH RINSE: 15.0000 mL | Freq: Once | OROMUCOSAL | Status: AC

## 2023-04-27 MED ORDER — PHENYLEPHRINE 80 MCG/ML (10ML) SYRINGE FOR IV PUSH (FOR BLOOD PRESSURE SUPPORT)
PREFILLED_SYRINGE | INTRAVENOUS | Status: DC | PRN
  Administered 2023-04-27: 80 ug via INTRAVENOUS

## 2023-04-27 MED ORDER — ONDANSETRON HCL 4 MG/2ML IJ SOLN: 4.0000 mg | Freq: Four times a day (QID) | INTRAMUSCULAR | Status: DC | PRN

## 2023-04-27 MED ORDER — HYDROCHLOROTHIAZIDE 25 MG PO TABS
25.0000 mg | ORAL_TABLET | Freq: Every day | ORAL | Status: DC
  Filled 2023-04-27: qty 1

## 2023-04-27 MED ORDER — OXYCODONE HCL 5 MG PO TABS
5.0000 mg | ORAL_TABLET | ORAL | Status: DC | PRN
  Administered 2023-04-27 – 2023-04-28 (×3): 5 mg via ORAL
  Filled 2023-04-27 (×4): qty 1

## 2023-04-27 MED ORDER — METHOCARBAMOL 500 MG PO TABS
ORAL_TABLET | ORAL | Status: AC
  Filled 2023-04-27: qty 1

## 2023-04-27 MED ORDER — LISINOPRIL 20 MG PO TABS
30.0000 mg | ORAL_TABLET | Freq: Every day | ORAL | Status: DC
  Filled 2023-04-27: qty 1

## 2023-04-27 MED ORDER — BUPIVACAINE-EPINEPHRINE (PF) 0.25% -1:200000 IJ SOLN
INTRAMUSCULAR | Status: DC | PRN
Start: 2023-04-27 — End: 2023-04-27
  Administered 2023-04-27 (×2): 25 mL

## 2023-04-27 MED ORDER — EPHEDRINE 5 MG/ML INJ
INTRAVENOUS | Status: AC
  Filled 2023-04-27: qty 5

## 2023-04-27 MED ORDER — METHOCARBAMOL 1000 MG/10ML IJ SOLN: 500.0000 mg | Freq: Three times a day (TID) | INTRAVENOUS | Status: DC | PRN

## 2023-04-27 MED ORDER — FENTANYL CITRATE (PF) 250 MCG/5ML IJ SOLN
INTRAMUSCULAR | Status: DC | PRN
  Administered 2023-04-27 (×3): 50 ug via INTRAVENOUS

## 2023-04-27 MED ORDER — ONDANSETRON HCL 4 MG/2ML IJ SOLN
INTRAMUSCULAR | Status: DC | PRN
  Administered 2023-04-27: 4 mg via INTRAVENOUS

## 2023-04-27 MED ORDER — SCOPOLAMINE 1 MG/3DAYS TD PT72
MEDICATED_PATCH | TRANSDERMAL | Status: DC | PRN
  Administered 2023-04-27: 1 via TRANSDERMAL

## 2023-04-27 MED ORDER — CHLORHEXIDINE GLUCONATE 0.12 % MT SOLN
15.0000 mL | Freq: Once | OROMUCOSAL | Status: AC
  Administered 2023-04-27: 15 mL via OROMUCOSAL
  Filled 2023-04-27: qty 15

## 2023-04-27 MED ORDER — PROPOFOL 10 MG/ML IV BOLUS
INTRAVENOUS | Status: AC
  Filled 2023-04-27: qty 20

## 2023-04-27 MED ORDER — ACETAMINOPHEN 500 MG PO TABS
1000.0000 mg | ORAL_TABLET | Freq: Four times a day (QID) | ORAL | Status: DC
  Administered 2023-04-27 – 2023-04-28 (×4): 1000 mg via ORAL
  Filled 2023-04-27 (×4): qty 2

## 2023-04-27 MED ORDER — ONDANSETRON HCL 4 MG/2ML IJ SOLN
INTRAMUSCULAR | Status: AC
  Filled 2023-04-27: qty 2

## 2023-04-27 MED ORDER — DEXAMETHASONE SODIUM PHOSPHATE 10 MG/ML IJ SOLN
INTRAMUSCULAR | Status: AC
  Filled 2023-04-27: qty 1

## 2023-04-27 MED ORDER — PROPOFOL 10 MG/ML IV BOLUS
INTRAVENOUS | Status: DC | PRN
  Administered 2023-04-27: 130 mg via INTRAVENOUS
  Administered 2023-04-27: 30 mg via INTRAVENOUS

## 2023-04-27 MED ORDER — OXYCODONE HCL 5 MG PO TABS
ORAL_TABLET | ORAL | Status: AC
  Filled 2023-04-27: qty 1

## 2023-04-27 MED ORDER — BUPIVACAINE-EPINEPHRINE 0.25% -1:200000 IJ SOLN
INTRAMUSCULAR | Status: DC | PRN
  Administered 2023-04-27: 7 mL

## 2023-04-27 MED ORDER — SUCCINYLCHOLINE CHLORIDE 200 MG/10ML IV SOSY
PREFILLED_SYRINGE | INTRAVENOUS | Status: AC
  Filled 2023-04-27: qty 10

## 2023-04-27 MED ORDER — CHLORHEXIDINE GLUCONATE CLOTH 2 % EX PADS: 6.0000 | MEDICATED_PAD | Freq: Once | CUTANEOUS | Status: DC

## 2023-04-27 MED ORDER — SUGAMMADEX SODIUM 200 MG/2ML IV SOLN
INTRAVENOUS | Status: DC | PRN
  Administered 2023-04-27: 200 mg via INTRAVENOUS

## 2023-04-27 MED ORDER — SODIUM CHLORIDE 0.9 % IV SOLN: INTRAVENOUS | Status: DC

## 2023-04-27 MED ORDER — LACTATED RINGERS IV SOLN: INTRAVENOUS | Status: DC

## 2023-04-27 MED ORDER — MIDAZOLAM HCL 2 MG/2ML IJ SOLN
INTRAMUSCULAR | Status: DC | PRN
  Administered 2023-04-27: 2 mg via INTRAVENOUS

## 2023-04-27 MED ORDER — ONDANSETRON 4 MG PO TBDP: 4.0000 mg | ORAL_TABLET | Freq: Four times a day (QID) | ORAL | Status: DC | PRN

## 2023-04-27 MED ORDER — LIDOCAINE 2% (20 MG/ML) 5 ML SYRINGE
INTRAMUSCULAR | Status: DC | PRN
  Administered 2023-04-27: 20 mg via INTRAVENOUS

## 2023-04-27 MED ORDER — FENTANYL CITRATE (PF) 100 MCG/2ML IJ SOLN
INTRAMUSCULAR | Status: AC
  Filled 2023-04-27: qty 2

## 2023-04-27 MED ORDER — OXYCODONE HCL 5 MG PO TABS
5.0000 mg | ORAL_TABLET | Freq: Once | ORAL | Status: AC
  Administered 2023-04-27: 5 mg via ORAL

## 2023-04-27 MED ORDER — FENTANYL CITRATE (PF) 250 MCG/5ML IJ SOLN
INTRAMUSCULAR | Status: AC
  Filled 2023-04-27: qty 5

## 2023-04-27 MED ORDER — KETOROLAC TROMETHAMINE 15 MG/ML IJ SOLN: 15.0000 mg | Freq: Four times a day (QID) | INTRAMUSCULAR | Status: DC | PRN

## 2023-04-27 MED ORDER — 0.9 % SODIUM CHLORIDE (POUR BTL) OPTIME
TOPICAL | Status: DC | PRN
  Administered 2023-04-27: 1000 mL

## 2023-04-27 MED ORDER — FENTANYL CITRATE (PF) 100 MCG/2ML IJ SOLN
25.0000 ug | INTRAMUSCULAR | Status: DC | PRN
  Administered 2023-04-27 (×3): 50 ug via INTRAVENOUS

## 2023-04-27 MED ORDER — ACETAMINOPHEN 500 MG PO TABS
1000.0000 mg | ORAL_TABLET | ORAL | Status: AC
  Administered 2023-04-27: 1000 mg via ORAL
  Filled 2023-04-27: qty 2

## 2023-04-27 MED ORDER — AMISULPRIDE (ANTIEMETIC) 5 MG/2ML IV SOLN: 10.0000 mg | Freq: Once | INTRAVENOUS | Status: DC | PRN

## 2023-04-27 MED ORDER — MIDAZOLAM HCL 2 MG/2ML IJ SOLN
INTRAMUSCULAR | Status: AC
  Filled 2023-04-27: qty 2

## 2023-04-27 MED ORDER — CEFAZOLIN SODIUM-DEXTROSE 2-3 GM-%(50ML) IV SOLR
INTRAVENOUS | Status: DC | PRN
  Administered 2023-04-27: 2 g via INTRAVENOUS

## 2023-04-27 MED ORDER — SIMETHICONE 80 MG PO CHEW
40.0000 mg | CHEWABLE_TABLET | Freq: Four times a day (QID) | ORAL | Status: DC | PRN
  Administered 2023-04-27: 40 mg via ORAL
  Filled 2023-04-27: qty 1

## 2023-04-27 MED ORDER — CLONIDINE HCL (ANALGESIA) 100 MCG/ML EP SOLN
EPIDURAL | Status: DC | PRN
  Administered 2023-04-27 (×2): 50 ug

## 2023-04-27 MED ORDER — DEXAMETHASONE SODIUM PHOSPHATE 10 MG/ML IJ SOLN
INTRAMUSCULAR | Status: DC | PRN
  Administered 2023-04-27: 10 mg via INTRAVENOUS

## 2023-04-27 MED ORDER — PHENYLEPHRINE 80 MCG/ML (10ML) SYRINGE FOR IV PUSH (FOR BLOOD PRESSURE SUPPORT)
PREFILLED_SYRINGE | INTRAVENOUS | Status: AC
  Filled 2023-04-27: qty 10

## 2023-04-27 MED ORDER — MORPHINE SULFATE (PF) 2 MG/ML IV SOLN
2.0000 mg | INTRAVENOUS | Status: DC | PRN
  Administered 2023-04-27 (×2): 2 mg via INTRAVENOUS
  Filled 2023-04-27 (×2): qty 1

## 2023-04-27 MED ORDER — ROCURONIUM BROMIDE 10 MG/ML (PF) SYRINGE
PREFILLED_SYRINGE | INTRAVENOUS | Status: AC
  Filled 2023-04-27: qty 10

## 2023-04-27 MED ORDER — BUPIVACAINE-EPINEPHRINE (PF) 0.25% -1:200000 IJ SOLN
INTRAMUSCULAR | Status: AC
  Filled 2023-04-27: qty 30

## 2023-04-27 SURGICAL SUPPLY — 61 items
ADH SKN CLS APL DERMABOND .7 (GAUZE/BANDAGES/DRESSINGS) ×1
APL PRP STRL LF DISP 70% ISPRP (MISCELLANEOUS) ×1
APPLIER CLIP LOGIC TI 5 (MISCELLANEOUS) IMPLANT
APPLIER CLIP ROT 10 11.4 M/L (STAPLE)
APR CLP MED LRG 11.4X10 (STAPLE)
APR CLP MED LRG 33X5 (MISCELLANEOUS)
BAG COUNTER SPONGE SURGICOUNT (BAG) ×1 IMPLANT
BAG SPNG CNTER NS LX DISP (BAG) ×1
BINDER ABDOMINAL 12 XL 75-84 (SOFTGOODS) IMPLANT
BNDG GAUZE DERMACEA FLUFF 4 (GAUZE/BANDAGES/DRESSINGS) IMPLANT
BNDG GZE DERMACEA 4 6PLY (GAUZE/BANDAGES/DRESSINGS)
CANISTER SUCT 3000ML PPV (MISCELLANEOUS) IMPLANT
CHLORAPREP W/TINT 26 (MISCELLANEOUS) ×1 IMPLANT
CLIP APPLIE ROT 10 11.4 M/L (STAPLE) IMPLANT
COVER SURGICAL LIGHT HANDLE (MISCELLANEOUS) ×1 IMPLANT
DERMABOND ADVANCED .7 DNX12 (GAUZE/BANDAGES/DRESSINGS) ×1 IMPLANT
DEVICE RELIATACK FIXATION (MISCELLANEOUS) IMPLANT
DEVICE SECURE STRAP 25 ABSORB (INSTRUMENTS) IMPLANT
DEVICE TROCAR PUNCTURE CLOSURE (ENDOMECHANICALS) ×1 IMPLANT
DRAPE INCISE IOBAN 66X45 STRL (DRAPES) ×1 IMPLANT
ELECT CAUTERY BLADE 6.4 (BLADE) IMPLANT
ELECT REM PT RETURN 9FT ADLT (ELECTROSURGICAL) ×1
ELECTRODE REM PT RTRN 9FT ADLT (ELECTROSURGICAL) ×1 IMPLANT
GLOVE BIO SURGEON STRL SZ7 (GLOVE) ×1 IMPLANT
GLOVE BIOGEL PI IND STRL 7.5 (GLOVE) ×1 IMPLANT
GOWN STRL REUS W/ TWL LRG LVL3 (GOWN DISPOSABLE) ×3 IMPLANT
GOWN STRL REUS W/TWL LRG LVL3 (GOWN DISPOSABLE) ×3
IRRIG SUCT STRYKERFLOW 2 WTIP (MISCELLANEOUS)
IRRIGATION SUCT STRKRFLW 2 WTP (MISCELLANEOUS) IMPLANT
KIT BASIN OR (CUSTOM PROCEDURE TRAY) ×1 IMPLANT
KIT TURNOVER KIT B (KITS) ×1 IMPLANT
MARKER SKIN DUAL TIP RULER LAB (MISCELLANEOUS) ×1 IMPLANT
MESH VENTRALIGHT ST 6IN CRC (Mesh General) IMPLANT
NDL SPNL 22GX3.5 QUINCKE BK (NEEDLE) ×1 IMPLANT
NEEDLE SPNL 22GX3.5 QUINCKE BK (NEEDLE) ×1
NS IRRIG 1000ML POUR BTL (IV SOLUTION) ×1 IMPLANT
PAD ARMBOARD 7.5X6 YLW CONV (MISCELLANEOUS) ×2 IMPLANT
PENCIL BUTTON HOLSTER BLD 10FT (ELECTRODE) IMPLANT
RELOAD ENDO RELIATCK 10 HERNIA (MISCELLANEOUS) IMPLANT
RELOAD ENDO RELIATCK 5 HERNIA (MISCELLANEOUS) IMPLANT
RELOAD RELIATACK 10 (MISCELLANEOUS)
RELOAD RELIATACK 5 (MISCELLANEOUS)
SCISSORS LAP 5X35 DISP (ENDOMECHANICALS) IMPLANT
SET TUBE SMOKE EVAC HIGH FLOW (TUBING) ×1 IMPLANT
SHEARS HARMONIC ACE PLUS 36CM (ENDOMECHANICALS) IMPLANT
SLEEVE Z-THREAD 5X100MM (TROCAR) ×1 IMPLANT
STRIP CLOSURE SKIN 1/2X4 (GAUZE/BANDAGES/DRESSINGS) ×1 IMPLANT
SUT MNCRL AB 4-0 PS2 18 (SUTURE) ×1 IMPLANT
SUT NOVA NAB DX-16 0-1 5-0 T12 (SUTURE) IMPLANT
SUT PROLENE 0 CT 1 CR/8 (SUTURE) ×1 IMPLANT
SUT VIC AB 0 CT1 27 (SUTURE) ×2
SUT VIC AB 0 CT1 27XBRD ANBCTR (SUTURE) ×2 IMPLANT
TOWEL GREEN STERILE (TOWEL DISPOSABLE) ×1 IMPLANT
TOWEL GREEN STERILE FF (TOWEL DISPOSABLE) ×1 IMPLANT
TRAY FOLEY W/BAG SLVR 14FR (SET/KITS/TRAYS/PACK) IMPLANT
TRAY LAPAROSCOPIC MC (CUSTOM PROCEDURE TRAY) ×1 IMPLANT
TROCAR 11X100 Z THREAD (TROCAR) ×1 IMPLANT
TROCAR BALLN 12MMX100 BLUNT (TROCAR) IMPLANT
TROCAR Z-THREAD OPTICAL 5X100M (TROCAR) ×1 IMPLANT
WARMER LAPAROSCOPE (MISCELLANEOUS) ×1 IMPLANT
WATER STERILE IRR 1000ML POUR (IV SOLUTION) ×1 IMPLANT

## 2023-04-27 NOTE — Transfer of Care (Signed)
Immediate Anesthesia Transfer of Care Note  Patient: Natasha Graham  Procedure(s) Performed: LAPAROSCOPIC ASSISTED INCISIONAL HERNIA WITH MESH (Abdomen)  Patient Location: PACU  Anesthesia Type:General and Regional  Level of Consciousness: drowsy  Airway & Oxygen Therapy: Patient Spontanous Breathing and Patient connected to face mask oxygen  Post-op Assessment: Report given to RN and Post -op Vital signs reviewed and stable  Post vital signs: Reviewed and stable  Last Vitals:  Vitals Value Taken Time  BP    Temp    Pulse 86 04/27/23 0911  Resp 17 04/27/23 0911  SpO2 97 % 04/27/23 0911  Vitals shown include unfiled device data.  Last Pain:  Vitals:   04/27/23 0900  TempSrc:   PainSc: Asleep      Patients Stated Pain Goal: 0 (04/27/23 0553)  Complications: There were no known notable events for this encounter.

## 2023-04-27 NOTE — Anesthesia Preprocedure Evaluation (Signed)
Anesthesia Evaluation  Patient identified by MRN, date of birth, ID band Patient awake    Reviewed: Allergy & Precautions, NPO status , Patient's Chart, lab work & pertinent test results  History of Anesthesia Complications (+) PONV and history of anesthetic complications  Airway Mallampati: II  TM Distance: >3 FB Neck ROM: Full    Dental  (+) Dental Advisory Given   Pulmonary neg pulmonary ROS   breath sounds clear to auscultation       Cardiovascular hypertension, Pt. on medications  Rhythm:Regular Rate:Normal     Neuro/Psych negative neurological ROS     GI/Hepatic negative GI ROS, Neg liver ROS,,,  Endo/Other  negative endocrine ROS    Renal/GU negative Renal ROS     Musculoskeletal   Abdominal   Peds  Hematology negative hematology ROS (+)   Anesthesia Other Findings   Reproductive/Obstetrics                             Anesthesia Physical Anesthesia Plan  ASA: 2  Anesthesia Plan: General   Post-op Pain Management: Regional block* and Tylenol PO (pre-op)*   Induction: Intravenous  PONV Risk Score and Plan: 4 or greater and Scopolamine patch - Pre-op, Midazolam, Dexamethasone, Ondansetron and Treatment may vary due to age or medical condition  Airway Management Planned: Oral ETT  Additional Equipment:   Intra-op Plan:   Post-operative Plan: Extubation in OR  Informed Consent: I have reviewed the patients History and Physical, chart, labs and discussed the procedure including the risks, benefits and alternatives for the proposed anesthesia with the patient or authorized representative who has indicated his/her understanding and acceptance.     Dental advisory given  Plan Discussed with: CRNA  Anesthesia Plan Comments:        Anesthesia Quick Evaluation

## 2023-04-27 NOTE — H&P (Signed)
62 year old female who I know well from her treatment for breast cancer. She has 1) bilateral nipple sparing mastectomies. She has also undergone a robot-assisted total hysterectomy with bilateral salpingo oophorectomy. This is her only abdominal surgery. She currently is being treated for stage IV breast cancer that is metastatic to the bone. This is ER and PR positive. This is remained stable since 2021 and is confirmed by biopsy. She is considering some changes in her medications. She recently has guardian testing that shows no mutation. She has been seen by her oncologist and mentions holding her Verzenio for few weeks around surgery. She had not ventral hernia for some time. This may have gotten a little bit bigger over some time. She has a CT of her chest abdomen pelvis for staging in April 2023 that shows a ventral hernia containing some fat and a portion of nonobstructed transverse colon. She is here to discuss repair of the hernia due to the symptomatic nature.  Review of Systems: A complete review of systems was obtained from the patient. I have reviewed this information and discussed as appropriate with the patient. See HPI as well for other ROS.  Review of Systems  All other systems reviewed and are negative.  Medical History: Past Medical History:  Diagnosis Date  History of cancer  Hypertension   Past Surgical History:  Procedure Laterality Date  HYSTERECTOMY  MASTECTOMY Bilateral  Reconstructive surgery  TONSILLECTOMY   No Known Allergies  Current Outpatient Medications on File Prior to Visit  Medication Sig Dispense Refill  abemaciclib (VERZENIO) 100 mg tablet Take 100 mg by mouth 2 (two) times daily  CALCIUM 26-VIT D3-MAGNESIUM 15 ORAL Take by mouth  co-enzyme Q-10, ubiquinone, 200 mg capsule Take 200 mg by mouth once daily  denosumab (XGEVA) 120 mg/1.7 mL (70 mg/mL) injection Inject subcutaneously  flaxseed oiL Oil by Misc.(Non-Drug; Combo Route) route.   hydroCHLOROthiazide (HYDRODIURIL) 25 MG tablet Take 25 mg by mouth once daily  lisinopriL (ZESTRIL) 30 MG tablet Take 30 mg by mouth once daily  multivitamin tablet Take 1 tablet by mouth once daily  potassium chloride (KLOR-CON M20) 20 MEQ ER tablet Take 1 tablet by mouth once daily  TURMERIC ORAL Take by mouth   Family History  Problem Relation Age of Onset  High blood pressure (Hypertension) Mother  Colon cancer Father  High blood pressure (Hypertension) Father  High blood pressure (Hypertension) Brother  Diabetes Brother    Social History   Tobacco Use  Smoking Status Never  Smokeless Tobacco Never  Marital status: Married  Tobacco Use  Smoking status: Never  Smokeless tobacco: Never  Substance and Sexual Activity  Alcohol use: Never  Drug use: Never   Objective:   Vitals:  04/05/23 1441  BP: (!) 142/98  Pulse: 98  Temp: 36.7 C (98.1 F)  SpO2: 98%  Weight: 76.3 kg (168 lb 3.2 oz)  Height: 161.9 cm (5' 3.75")  PainSc: 0-No pain  PainLoc: Abdomen   Body mass index is 29.1 kg/m.  Physical Exam Vitals reviewed.  Constitutional:  Appearance: Normal appearance.  Abdominal:  General: There is no distension.  Palpations: Abdomen is soft.  Tenderness: There is no abdominal tenderness.  Hernia: A hernia is present.  Comments: About 2.5 cm reducible hernia but difficult to gauge size  Neurological:  Mental Status: She is alert.   Assessment and Plan:   Incisional hernia, without obstruction or gangrene  I think is reasonable due to her symptoms as well as her well-controlled stage  IV breast cancer consider repair of this hernia. I will need to touch base with her oncologist on what to do with her Verzenio. I am going to send her for a CT scan just because she thinks this might of gotten larger since the last 1 just to determine if there is not a better way to repair this. I think it would be reasonable given the exam today to do a laparoscopic assisted repair  with mesh. We discussed the usage of mesh as well as the rationale behind that. We discussed the issues with mesh as well. I told her about all of the incisions as well as the fixation of the mesh. We discussed that I likely would make an incision overlying this and debride what seems to be of quite large hernia sac also. She would need to be in the hospital overnight but likely could be discharged the following day. We discussed the recovery. We also discussed the risks which include but are not limited to bleeding, infection, open procedure, injury requiring repair, as well as hernia recurrence.

## 2023-04-27 NOTE — Anesthesia Procedure Notes (Signed)
Procedure Name: Intubation Date/Time: 04/27/2023 7:43 AM  Performed by: Marcene Duos, MDPre-anesthesia Checklist: Patient identified, Emergency Drugs available, Suction available and Patient being monitored Patient Re-evaluated:Patient Re-evaluated prior to induction Oxygen Delivery Method: Circle system utilized Preoxygenation: Pre-oxygenation with 100% oxygen Induction Type: IV induction Ventilation: Mask ventilation without difficulty Laryngoscope Size: Mac and 4 Grade View: Grade II Tube type: Oral Tube size: 7.0 mm Number of attempts: 1 Airway Equipment and Method: Stylet Placement Confirmation: ETT inserted through vocal cords under direct vision, positive ETCO2 and breath sounds checked- equal and bilateral Secured at: 21 cm Tube secured with: Tape Dental Injury: Teeth and Oropharynx as per pre-operative assessment  Difficulty Due To: Difficult Airway- due to limited oral opening Comments: By Stephani Police, SRNA

## 2023-04-27 NOTE — Anesthesia Procedure Notes (Signed)
Anesthesia Regional Block: TAP block   Pre-Anesthetic Checklist: , timeout performed,  Correct Patient, Correct Site, Correct Laterality,  Correct Procedure, Correct Position, site marked,  Risks and benefits discussed,  Surgical consent,  Pre-op evaluation,  At surgeon's request and post-op pain management  Laterality: Left and Right  Prep: chloraprep       Needles:  Injection technique: Single-shot  Needle Type: Echogenic Needle     Needle Length: 9cm  Needle Gauge: 21     Additional Needles:   Procedures:,,,, ultrasound used (permanent image in chart),,    Narrative:  Start time: 04/27/2023 7:10 AM End time: 04/27/2023 7:25 AM Injection made incrementally with aspirations every 5 mL.  Performed by: Personally  Anesthesiologist: Marcene Duos, MD

## 2023-04-27 NOTE — Op Note (Signed)
Preoperative diagnosis: Incisional hernia Postoperative diagnosis: Same as above Procedure: Laparoscopic incisional hernia repair with 15 cm round Ventralight mesh, 3.8x3 cm defect Surgeon: Dr. Harden Mo Anesthesia: General With bilateral tap blocks Estimated blood loss: Minimal Complications: None Drains: None Sponge needle count was correct at completion Disposition recovery in stable condition  Indications: This 62 year old female who I know well for treatment for stage IV breast cancer.  She has also undergone a robotic assisted total hysterectomy with bilateral salpingo-oophorectomy.  She has a abdominal wall hernia that has begun to be symptomatic.  I discussed this with oncology and we have elected to proceed with repair due to the fact that is symptomatic.  Procedure: After informed consent was obtained she was taken to the operating room.  She had undergone a bilateral tap block.  She was given antibiotics.  SCDs were placed.  She was placed under general anesthesia without complication.  She was prepped and draped in a standard sterile surgical fashion.  A surgical timeout was then performed.  I placed Ioban over the field.  I then made a small stab incision in the left upper quadrant.  I then inserted a 5 mm direct optical entry trocar without any injury.  The abdomen was then insufflated to 15 mmHg pressure.  The hernia had some omentum that was adherent to it.  This was about 3 and half centimeters which correlated to her CAT scan.  I did place an additional 10 mm and 5 mm trocar in the left abdomen.  I placed two 5 mm trocars in the right side of her abdomen.  I then proceeded to lyse the adhesions to the hernia sharply.  I took the falciform ligament down with the cautery to have a place to land the mesh.  I then used a suture passer to use #1 Novafil sutures to primarily close the defect.  Once I had done this I then chose a 15 cm round piece of ventral light mesh.  This gave good  coverage.  I placed 4 sutures of 0 Prolene in the cardinal positions around the mesh.  I then inserted the mesh into the abdomen through the larger trocar site.  I then laid this flat.  I then used the suture passer to pull up the Prolene sutures in the correct position to give overlap.  These were tied down.  I then used the secure strap tacker to secure the mesh edges.  This was in good position and laid flat.  Hemostasis was observed.  I then removed the larger trocar and closed this with a 0 Vicryl suture using the suture passer.  I then desufflated the abdomen and remove the remaining trocars.  These were closed with 4-0 Monocryl and glue.  She tolerated this well was extubated transferred recovery stable.

## 2023-04-27 NOTE — Plan of Care (Signed)
Patient ID: Natasha Graham, female   DOB: [birth year], 62 y.o.   MRN: [id number]  Problem: Education: Goal: Knowledge of General Education information will improve Description: Including pain rating scale, medication(s)/side effects and non-pharmacologic comfort measures Outcome: Progressing   Problem: Health Behavior/Discharge Planning: Goal: Ability to manage health-related needs will improve Outcome: Progressing   Problem: Clinical Measurements: Goal: Ability to maintain clinical measurements within normal limits will improve Outcome: Progressing Goal: Will remain free from infection Outcome: Progressing Goal: Diagnostic test results will improve Outcome: Progressing Goal: Respiratory complications will improve Outcome: Progressing Goal: Cardiovascular complication will be avoided Outcome: Progressing   Problem: Activity: Goal: Risk for activity intolerance will decrease Outcome: Progressing   Problem: Nutrition: Goal: Adequate nutrition will be maintained Outcome: Progressing   Problem: Coping: Goal: Level of anxiety will decrease Outcome: Progressing   Problem: Elimination: Goal: Will not experience complications related to bowel motility Outcome: Progressing Goal: Will not experience complications related to urinary retention Outcome: Progressing   Problem: Pain Managment: Goal: General experience of comfort will improve Outcome: Progressing   Problem: Safety: Goal: Ability to remain free from injury will improve Outcome: Progressing   Problem: Skin Integrity: Goal: Risk for impaired skin integrity will decrease Outcome: Progressing   Lidia Collum, RN

## 2023-04-27 NOTE — Interval H&P Note (Signed)
History and Physical Interval Note:  04/27/2023 7:03 AM  Natasha Graham  has presented today for surgery, with the diagnosis of INCISIONAL HERNIA.  The various methods of treatment have been discussed with the patient and family. After consideration of risks, benefits and other options for treatment, the patient has consented to  Procedure(s) with comments: LAPAROSCOPIC ASSISTED INCISIONAL HERNIA WITH MESH (N/A) - GEN/TAP BLOCK as a surgical intervention.  The patient's history has been reviewed, patient examined, no change in status, stable for surgery.  I have reviewed the patient's chart and labs.  Questions were answered to the patient's satisfaction.     Emelia Loron

## 2023-04-27 NOTE — Anesthesia Postprocedure Evaluation (Signed)
Anesthesia Post Note  Patient: Natasha Graham  Procedure(s) Performed: LAPAROSCOPIC ASSISTED INCISIONAL HERNIA WITH MESH (Abdomen)     Patient location during evaluation: PACU Anesthesia Type: General Level of consciousness: awake and alert Pain management: pain level controlled Vital Signs Assessment: post-procedure vital signs reviewed and stable Respiratory status: spontaneous breathing, nonlabored ventilation, respiratory function stable and patient connected to nasal cannula oxygen Cardiovascular status: blood pressure returned to baseline and stable Postop Assessment: no apparent nausea or vomiting Anesthetic complications: no  There were no known notable events for this encounter.  Last Vitals:  Vitals:   04/27/23 1128 04/27/23 1554  BP: (!) 145/69 (!) 160/75  Pulse: 65 78  Resp:    Temp: 36.4 C (!) 36.4 C  SpO2: 98% 98%    Last Pain:  Vitals:   04/27/23 1554  TempSrc: Oral  PainSc:                  Kennieth Rad

## 2023-04-27 NOTE — Discharge Instructions (Signed)
[facility]  UMBILICAL OR INGUINAL HERNIA REPAIR: POST OP INSTRUCTIONS  Always review your discharge instruction sheet given to you by the facility where your surgery was performed. IF YOU HAVE DISABILITY OR FAMILY LEAVE FORMS, YOU MUST BRING THEM TO THE OFFICE FOR PROCESSING.   DO NOT GIVE THEM TO YOUR DOCTOR.  A  prescription for pain medication may be given to you upon discharge.  Take your pain medication as prescribed, if needed.  If narcotic pain medicine is not needed, then you may take acetaminophen (Tylenol), naprosyn (Alleve) or ibuprofen (Advil) as needed. Take your usually prescribed medications unless otherwise directed. If you need a refill on your pain medication, please contact your pharmacy.  They will contact our office to request authorization. Prescriptions will not be filled after 5 pm or on week-ends. You should follow a light diet the first 24 hours after arrival home, such as soup and crackers, etc.  Be sure to include lots of fluids daily.  Resume your normal diet the day after surgery. Most patients will experience some swelling and bruising around the umbilicus or in the groin and scrotum.  Ice packs and reclining will help.  Swelling and bruising can take several days to resolve.  It is common to experience some constipation if taking pain medication after surgery.  Increasing fluid intake and taking a stool softener (such as Colace) will usually help or prevent this problem from occurring.  A mild laxative (Milk of Magnesia or Miralax) should be taken according to package directions if there are no bowel movements after 48 hours. Unless discharge instructions indicate otherwise, you may remove your bandages 48 hours after surgery, and you may shower at that time.  You may have steri-strips (small skin tapes) in place directly over the incision.  These strips should be left on the skin for 7-10 days and will come off on their own.  If your surgeon used  skin glue on the incision, you may shower in 24 hours.  The glue will flake off over the next 2-3 weeks.  Any sutures or staples will be removed at the office during your follow-up visit. ACTIVITIES:  You may resume regular (light) daily activities beginning the next day--such as daily self-care, walking, climbing stairs--gradually increasing activities as tolerated.  You may have sexual intercourse when it is comfortable.  Refrain from any heavy lifting or straining until approved by your doctor. You may drive when you are no longer taking prescription pain medication, you can comfortably wear a seatbelt, and you can safely maneuver your car and apply brakes. RETURN TO WORK:  __________________________________________________________ You should see your doctor in the office for a follow-up appointment approximately 2-3 weeks after your surgery.  Make sure that you call for this appointment within a day or two after you arrive home to insure a convenient appointment time. OTHER INSTRUCTIONS:  __________________________________________________________________________________________________________________________________________________________________________________________  WHEN TO CALL YOUR DOCTOR: Fever over 101.0 Inability to urinate Nausea and/or vomiting Extreme swelling or bruising Continued bleeding from incision. Increased pain, redness, or drainage from the incision  The clinic staff is available to answer your questions during regular business hours.  Please don't hesitate to call and ask to speak to one of the nurses for clinical concerns.  If you have a medical emergency, go to the nearest emergency room or call 911.  A surgeon from [facility] is always on call at the hospital   [address], [address], [place], [place]    [address] ?  [address] [address], [place],    [address] [phone] ? [phone] ? FAX [phone] Web site:  [email or web address]  

## 2023-04-28 ENCOUNTER — Encounter (HOSPITAL_COMMUNITY): Payer: Self-pay | Admitting: General Surgery

## 2023-04-28 DIAGNOSIS — I10 Essential (primary) hypertension: Secondary | ICD-10-CM | POA: Diagnosis not present

## 2023-04-28 DIAGNOSIS — K432 Incisional hernia without obstruction or gangrene: Secondary | ICD-10-CM | POA: Diagnosis not present

## 2023-04-28 DIAGNOSIS — Z79899 Other long term (current) drug therapy: Secondary | ICD-10-CM | POA: Diagnosis not present

## 2023-04-28 DIAGNOSIS — Z853 Personal history of malignant neoplasm of breast: Secondary | ICD-10-CM | POA: Diagnosis not present

## 2023-04-28 MED ORDER — OXYCODONE HCL 5 MG PO TABS: 5.0000 mg | ORAL_TABLET | ORAL | 0 refills | Status: DC | PRN

## 2023-04-28 MED ORDER — METHOCARBAMOL 500 MG PO TABS: 500.0000 mg | ORAL_TABLET | Freq: Three times a day (TID) | ORAL | 0 refills | Status: DC | PRN

## 2023-04-28 NOTE — Discharge Summary (Signed)
Physician Discharge Summary  Patient ID: Natasha Graham MRN: [id number] DOB/AGE: 63-Aug-1962 63 y.o.  Admit date: 04/27/2023 Discharge date: 04/28/2023  Admission Diagnoses: Stage IV breast cancer Incisional hernia  Discharge Diagnoses:  Principal Problem:   Incisional hernia without obstruction or gangrene   Discharged Condition: good  Hospital Course: 62 yof s/p lap repair with mesh of incisional hernia. Tol diet, ambulating, pain controlled, ready for dc  Consults: None  Significant Diagnostic Studies: none  Treatments: surgery: lvh  Discharge Exam: Blood pressure (!) 164/65, pulse 70, temperature 99.1 F (37.3 C), temperature source Oral, resp. rate 16, height 5\' 4"  (1.626 m), weight 74.8 kg, last menstrual period 10/16/2013, SpO2 97%. No hematoma, soft abdomen  Disposition: Discharge disposition: 01-Home or Self Care        Allergies as of 04/28/2023   No Known Allergies      Medication List     TAKE these medications    ALOE VERA PO Take 1 capsule by mouth daily.   anastrozole 1 MG tablet Commonly known as: ARIMIDEX Take 1 tablet (1 mg total) by mouth daily.   Calcium Plus Vitamin D3 600-20 MG-MCG Tabs Generic drug: Calcium Carb-Cholecalciferol Take 1 tablet by mouth 2 (two) times daily.   cholecalciferol 25 MCG (1000 UNIT) tablet Commonly known as: VITAMIN D3 Take 1,000 Units by mouth daily.   CoQ10 200 MG Caps Take 200 mg by mouth daily.   fulvestrant 250 MG/5ML injection Commonly known as: FASLODEX Inject 50 mg into the muscle every 28 (twenty-eight) days.   hydrochlorothiazide 25 MG tablet Commonly known as: HYDRODIURIL Take 25 mg by mouth daily.   ibuprofen 200 MG tablet Commonly known as: ADVIL Take 400 mg by mouth every 8 (eight) hours as needed for moderate pain.   Linseed Oil Oil Take 1,200 mg by mouth daily.   lisinopril 30 MG tablet Commonly known as: ZESTRIL Take 30 mg by mouth daily.   Magnesium 400 MG Tabs Take  400 mg by mouth every evening.   methocarbamol 500 MG tablet Commonly known as: ROBAXIN Take 1 tablet (500 mg total) by mouth every 8 (eight) hours as needed for muscle spasms.   multivitamin capsule Take 1 capsule by mouth daily.   oxyCODONE 5 MG immediate release tablet Commonly known as: Oxy IR/ROXICODONE Take 1 tablet (5 mg total) by mouth every 4 (four) hours as needed for moderate pain.   Potassium 99 MG Tabs Take 99 mg by mouth in the morning and at bedtime.   PROBIOTIC DAILY PO Take 1 capsule by mouth 2 (two) times a week.   Turmeric 500 MG Caps Take 500 mg by mouth 2 (two) times daily.   Verzenio 100 MG tablet Generic drug: abemaciclib Take 1 tablet (100 mg total) by mouth 2 (two) times daily.   vitamin C 1000 MG tablet Take 1,000 mg by mouth daily.   Xgeva 120 MG/1.7ML Soln injection Generic drug: denosumab Inject 120 mg into the skin every 3 (three) months.        Follow-up Information     Emelia Loron, MD Follow up in 3 week(s).   Specialty: General Surgery Contact information: [address] [place] [address] [phone]                 Signed: Emelia Loron 04/28/2023, 7:58 AM

## 2023-04-28 NOTE — Plan of Care (Signed)
Patient ID: Natasha Graham, female   DOB: [birth year], 62 y.o.   MRN: [id number]  Problem: Education: Goal: Knowledge of General Education information will improve Description: Including pain rating scale, medication(s)/side effects and non-pharmacologic comfort measures Outcome: Adequate for Discharge   Problem: Health Behavior/Discharge Planning: Goal: Ability to manage health-related needs will improve Outcome: Adequate for Discharge   Problem: Clinical Measurements: Goal: Ability to maintain clinical measurements within normal limits will improve Outcome: Adequate for Discharge Goal: Will remain free from infection Outcome: Adequate for Discharge Goal: Diagnostic test results will improve Outcome: Adequate for Discharge Goal: Respiratory complications will improve Outcome: Adequate for Discharge Goal: Cardiovascular complication will be avoided Outcome: Adequate for Discharge   Problem: Activity: Goal: Risk for activity intolerance will decrease Outcome: Adequate for Discharge   Problem: Nutrition: Goal: Adequate nutrition will be maintained Outcome: Adequate for Discharge   Problem: Coping: Goal: Level of anxiety will decrease Outcome: Adequate for Discharge   Problem: Elimination: Goal: Will not experience complications related to bowel motility Outcome: Adequate for Discharge Goal: Will not experience complications related to urinary retention Outcome: Adequate for Discharge   Problem: Pain Managment: Goal: General experience of comfort will improve Outcome: Adequate for Discharge   Problem: Safety: Goal: Ability to remain free from injury will improve Outcome: Adequate for Discharge   Problem: Skin Integrity: Goal: Risk for impaired skin integrity will decrease Outcome: Adequate for Discharge    Lidia Collum, RN

## 2023-04-28 NOTE — Plan of Care (Signed)

## 2023-05-04 NOTE — Addendum Note (Signed)
Addendum  created 05/04/23 1032 by Marquis Buggy, CRNA   Cosign clinical note with attestation

## 2023-05-05 ENCOUNTER — Other Ambulatory Visit (HOSPITAL_COMMUNITY): Payer: Self-pay

## 2023-05-12 ENCOUNTER — Other Ambulatory Visit: Payer: Self-pay | Admitting: Physician Assistant

## 2023-05-12 DIAGNOSIS — Z170 Estrogen receptor positive status [ER+]: Secondary | ICD-10-CM

## 2023-05-13 ENCOUNTER — Other Ambulatory Visit (HOSPITAL_COMMUNITY): Payer: Self-pay

## 2023-05-13 ENCOUNTER — Inpatient Hospital Stay (HOSPITAL_BASED_OUTPATIENT_CLINIC_OR_DEPARTMENT_OTHER): Payer: BC Managed Care – PPO | Admitting: Physician Assistant

## 2023-05-13 ENCOUNTER — Inpatient Hospital Stay: Payer: BC Managed Care – PPO | Attending: Hematology

## 2023-05-13 VITALS — BP 150/79 | HR 109 | Temp 98.9°F | Resp 20 | Wt 164.6 lb

## 2023-05-13 DIAGNOSIS — C50512 Malignant neoplasm of lower-outer quadrant of left female breast: Secondary | ICD-10-CM | POA: Insufficient documentation

## 2023-05-13 DIAGNOSIS — C7951 Secondary malignant neoplasm of bone: Secondary | ICD-10-CM | POA: Insufficient documentation

## 2023-05-13 DIAGNOSIS — Z170 Estrogen receptor positive status [ER+]: Secondary | ICD-10-CM

## 2023-05-13 DIAGNOSIS — Z9012 Acquired absence of left breast and nipple: Secondary | ICD-10-CM | POA: Diagnosis not present

## 2023-05-13 DIAGNOSIS — Z923 Personal history of irradiation: Secondary | ICD-10-CM | POA: Diagnosis not present

## 2023-05-13 DIAGNOSIS — Z79811 Long term (current) use of aromatase inhibitors: Secondary | ICD-10-CM | POA: Insufficient documentation

## 2023-05-13 DIAGNOSIS — Z9221 Personal history of antineoplastic chemotherapy: Secondary | ICD-10-CM | POA: Insufficient documentation

## 2023-05-13 DIAGNOSIS — Z79899 Other long term (current) drug therapy: Secondary | ICD-10-CM | POA: Insufficient documentation

## 2023-05-13 LAB — CBC WITH DIFFERENTIAL (CANCER CENTER ONLY)
Abs Immature Granulocytes: 0.08 10*3/uL — ABNORMAL HIGH (ref 0.00–0.07)
Basophils Absolute: 0.1 10*3/uL (ref 0.0–0.1)
Basophils Relative: 1 %
Eosinophils Absolute: 0.2 10*3/uL (ref 0.0–0.5)
Eosinophils Relative: 3 %
HCT: 31.5 % — ABNORMAL LOW (ref 36.0–46.0)
Hemoglobin: 11.2 g/dL — ABNORMAL LOW (ref 12.0–15.0)
Immature Granulocytes: 1 %
Lymphocytes Relative: 16 %
Lymphs Abs: 1 10*3/uL (ref 0.7–4.0)
MCH: 35.8 pg — ABNORMAL HIGH (ref 26.0–34.0)
MCHC: 35.6 g/dL (ref 30.0–36.0)
MCV: 100.6 fL — ABNORMAL HIGH (ref 80.0–100.0)
Monocytes Absolute: 0.6 10*3/uL (ref 0.1–1.0)
Monocytes Relative: 10 %
Neutro Abs: 4.2 10*3/uL (ref 1.7–7.7)
Neutrophils Relative %: 69 %
Platelet Count: 346 10*3/uL (ref 150–400)
RBC: 3.13 MIL/uL — ABNORMAL LOW (ref 3.87–5.11)
RDW: 11.2 % — ABNORMAL LOW (ref 11.5–15.5)
WBC Count: 6.1 10*3/uL (ref 4.0–10.5)
nRBC: 0 % (ref 0.0–0.2)

## 2023-05-13 LAB — CMP (CANCER CENTER ONLY)
ALT: 34 U/L (ref 0–44)
AST: 20 U/L (ref 15–41)
Albumin: 3.9 g/dL (ref 3.5–5.0)
Alkaline Phosphatase: 82 U/L (ref 38–126)
Anion gap: 8 (ref 5–15)
BUN: 10 mg/dL (ref 8–23)
CO2: 30 mmol/L (ref 22–32)
Calcium: 9.5 mg/dL (ref 8.9–10.3)
Chloride: 101 mmol/L (ref 98–111)
Creatinine: 0.79 mg/dL (ref 0.44–1.00)
GFR, Estimated: >60 mL/min (ref >60)
Glucose, Bld: 122 mg/dL — ABNORMAL HIGH (ref 70–99)
Potassium: 3.7 mmol/L (ref 3.5–5.1)
Sodium: 139 mmol/L (ref 135–145)
Total Bilirubin: 0.4 mg/dL (ref 0.3–1.2)
Total Protein: 7 g/dL (ref 6.5–8.1)

## 2023-05-13 NOTE — Progress Notes (Signed)
[facility]   [phone] [phone]   Clinic Follow up Note   Patient Care Team: Hamrick, Durward Fortes, MD as PCP - General (Family Medicine) Emelia Loron, MD as Consulting Physician (General Surgery) Pershing Proud, RN as Registered Nurse Donnelly Angelica, RN as Registered Nurse Dillingham, Alena Bills, DO as Attending Physician (Plastic Surgery) Antony Blackbird, MD as Consulting Physician (Radiation Oncology) Olivia Mackie, MD as Consulting Physician (Obstetrics and Gynecology) Anselm Lis, RPH-CPP (Pharmacist) Malachy Mood, MD as Consulting Physician (Medical Oncology)  Date of Service:  05/14/2023  CHIEF COMPLAINT: f/u of  metastatic breast cancer    CURRENT THERAPY:  -denosumab and fulvestrant q4weeks, started 08/15/20, change Xgeva to every 3 months in 12/2022 Verzenio 100 mg orally  2 times daily started 10/2022.   SUMMARY OF ONCOLOGIC HISTORY: Oncology History Overview Note   Cancer Staging  Malignant neoplasm metastatic to bone [facility]) Staging form: Bone - Appendicular Skeleton, Trunk, Skull, and Facial Bones, AJCC 8th Edition - Clinical: Stage IVB (cT3, cN1, cM1b) - Signed by Lowella Dell, MD on 08/15/2020  Malignant neoplasm of lower-outer quadrant of left breast of female, estrogen receptor positive (HCC) Staging form: Breast, AJCC 8th Edition - Clinical: Stage IIIA (cT3, cN1, cM0, G2, ER+, PR-, HER2-) - Signed by Lowella Dell, MD on 08/15/2020     Breast neoplasm, Tis (DCIS), right (Resolved)  05/16/2014 Initial Diagnosis   Breast neoplasm, Tis (DCIS), right   06/27/2020 Genetic Testing   Negative genetic testing on the CancerNext-Expanded+RNAinsight panel testing.  The CancerNext-Expanded gene panel offered by [facility] and includes sequencing and rearrangement analysis for the following 77 genes: AIP, ALK, APC*, ATM*, AXIN2, BAP1, BARD1, BLM, BMPR1A, BRCA1*, BRCA2*, BRIP1*, CDC73, CDH1*, CDK4, CDKN1B, CDKN2A,  CHEK2*, CTNNA1, DICER1, FANCC, FH, FLCN, GALNT12, KIF1B, LZTR1, MAX, MEN1, MET, MLH1*, MSH2*, MSH3, MSH6*, MUTYH*, NBN, NF1*, NF2, NTHL1, PALB2*, PHOX2B, PMS2*, POT1, PRKAR1A, PTCH1, PTEN*, RAD51C*, RAD51D*, RB1, RECQL, RET, SDHA, SDHAF2, SDHB, SDHC, SDHD, SMAD4, SMARCA4, SMARCB1, SMARCE1, STK11, SUFU, TMEM127, TP53*, TSC1, TSC2, VHL and XRCC2 (sequencing and deletion/duplication); EGFR, EGLN1, HOXB13, KIT, MITF, PDGFRA, POLD1, and POLE (sequencing only); EPCAM and GREM1 (deletion/duplication only). DNA and RNA analyses performed for * genes. The report date is 06/27/2020.   Malignant neoplasm of lower-outer quadrant of left breast of female, estrogen receptor positive (HCC)  10/18/2019 Initial Diagnosis   Malignant neoplasm of lower-outer quadrant of left breast of female, estrogen receptor positive (HCC)   08/15/2020 Cancer Staging   Staging form: Breast, AJCC 8th Edition - Clinical: Stage IIIA (cT3, cN1, cM0, G2, ER+, PR-, HER2-) - Signed by Lowella Dell, MD on 08/15/2020   Malignant neoplasm metastatic to bone (HCC)  08/05/2020 Initial Diagnosis   Bone metastases (HCC)   08/15/2020 Cancer Staging   Staging form: Bone - Appendicular Skeleton, Trunk, Skull, and Facial Bones, AJCC 8th Edition - Clinical: Stage IVB (cT3, cN1, cM1b) - Signed by Lowella Dell, MD on 08/15/2020   12/21/2022 PET scan    IMPRESSION: Status post bilateral breast reconstruction with left axillary lymph node dissection.   Multifocal/diffuse treated osseous metastases, as above.   Otherwise, no evidence of recurrent or metastatic disease.        INTERVAL HISTORY:  Natasha Graham is here for a follow up of  metastatic breast cancer . She was last seen by Dr. Mosetta Putt on 03/24/2023. In the interim, she underwent abdominal hernia repair on 04/27/2023 with Dr. Dwain Sarna. She presents to the clinic alone.  Ms. Klauer reports she is recovering from her surgery. She had some redness and itchiness around her  incision site that is likely from the glue. She has been applying steroid cream and taking antihistamines with improvement of redness and itchiness. She reports her energy and appetite are slowly improving since surgery. She has noticed a low grade fever, mainly in the evening, starting this past Saturday. She does take ibuprofen and tylenol in the morning for abdominal pain after surgery so uncertain if that is masking her fever during the day. She denies nausea, vomiting or bowel habit changes. She denies easy bruising or signs of bleeding. She is otherwise feeling well.   All other systems were reviewed with the patient and are negative.  MEDICAL HISTORY:  Past Medical History:  Diagnosis Date   Cancer [facility])    breast cancer left 2021 roght 2015   Family history of brain cancer    Family history of breast cancer    Family history of colon cancer    Family history of stomach cancer    History of radiation therapy last done February 06 2020   Hypertension    PMB (postmenopausal bleeding)    PONV (postoperative nausea and vomiting)    likes scopolamine patch   Scoliosis    Wears glasses    Wears glasses     SURGICAL HISTORY: Past Surgical History:  Procedure Laterality Date   AUGMENTATION MAMMAPLASTY Right    2015 post mastectomy   AXILLARY LYMPH NODE DISSECTION Left 11/13/2019   Procedure: LEFT AXILLARY LYMPH NODE DISSECTION;  Surgeon: Emelia Loron, MD;  Location: [facility];  Service: General;  Laterality: Left;   BREAST IMPLANT EXCHANGE Right 03/19/2021   Procedure: exchange of right saline implant;  Surgeon: Peggye Form, DO;  Location: [facility];  Service: Plastics;  Laterality: Right;   BREAST RECONSTRUCTION WITH PLACEMENT OF TISSUE EXPANDER AND FLEX HD (ACELLULAR HYDRATED DERMIS) Right 05/16/2014   Procedure: IMMEDIATE RIGHT BREAST RECONSTRUCTION WITH PLACEMENT OF TISSUE EXPANDER AND FLEX HD (ACELLULAR HYDRATED DERMIS);  Surgeon: Wayland Denis, DO;  Location: [facility];  Service: Plastics;  Laterality: Right;   BREAST RECONSTRUCTION WITH PLACEMENT OF TISSUE EXPANDER AND FLEX HD (ACELLULAR HYDRATED DERMIS) Left 09/27/2019   Procedure: LEFT BREAST RECONSTRUCTION WITH PLACEMENT OF TISSUE EXPANDER AND FLEX HD (ACELLULAR HYDRATED DERMIS);  Surgeon: Peggye Form, DO;  Location: [facility];  Service: Plastics;  Laterality: Left;   BREAST SURGERY     right breast excisional biopsy   DILATATION & CURETTAGE/HYSTEROSCOPY WITH MYOSURE N/A 12/20/2020   Procedure: DILATATION & CURETTAGE/HYSTEROSCOPY WITH  MYOSURE;  Surgeon: Olivia Mackie, MD;  Location: [facility];  Service: Gynecology;  Laterality: N/A;   DILATION AND CURETTAGE OF UTERUS     HYSTEROSCOPY WITH D & C N/A 09/20/2015   Procedure: DILATATION AND CURETTAGE /HYSTEROSCOPY;  Surgeon: Olivia Mackie, MD;  Location: [facility];  Service: Gynecology;  Laterality: N/A;   INCISIONAL HERNIA REPAIR N/A 04/27/2023   Procedure: LAPAROSCOPIC ASSISTED INCISIONAL HERNIA WITH MESH;  Surgeon: Emelia Loron, MD;  Location: [facility];  Service: General;  Laterality: N/A;  GEN/TAP BLOCK   LIPOSUCTION Bilateral 08/08/2014   Procedure: LIPO SUCTION ;  Surgeon: Wayland Denis, DO;  Location:  SURGERY CENTER;  Service: Plastics;  Laterality: Bilateral;   MASTECTOMY Right 05/16/2014   placement of acellular dermal matrix & tissue expanders    MASTOPEXY Left 08/08/2014   Procedure:  MASTOPEXY FOR SYMMETRY;  Surgeon:  Claire Sanger, DO;  Location: [facility];  Service: Plastics;  Laterality: Left;   NIPPLE SPARING MASTECTOMY WITH SENTINEL LYMPH NODE BIOPSY Left 09/27/2019   Procedure: LEFT NIPPLE SPARING MASTECTOMY WITH LEFT AXILLARY SENTINEL LYMPH NODE BIOPSY;  Surgeon: Emelia Loron, MD;  Location: [facility];  Service: General;  Laterality: Left;   REDUCTION MAMMAPLASTY Left    2015   REMOVAL OF TISSUE EXPANDER AND PLACEMENT OF  IMPLANT Right 08/08/2014   Procedure: REMOVAL OF RIGHT  TISSUE EXPANDERS WITH PLACEMENT OF RIGHT BREAST IMPLANTS WITH LIPO SUCTION ;  Surgeon: Wayland Denis, DO;  Location: [facility];  Service: Plastics;  Laterality: Right;   REMOVAL OF TISSUE EXPANDER AND PLACEMENT OF IMPLANT Left 03/19/2021   Procedure: Removal of left expander for saline implant;  Surgeon: Peggye Form, DO;  Location: [facility];  Service: Plastics;  Laterality: Left;  2 hours   ROBOTIC ASSISTED TOTAL HYSTERECTOMY WITH BILATERAL SALPINGO OOPHERECTOMY Bilateral 02/06/2021   Procedure: XI ROBOTIC ASSISTED TOTAL HYSTERECTOMY WITH BILATERAL SALPINGO OOPHORECTOMY, LYSIS of SIGMOID ADHESIONS MCCALL CUL DE PLASTY;  Surgeon: Olivia Mackie, MD;  Location: [facility];  Service: Gynecology;  Laterality: Bilateral;   SCAR REVISION Left 11/13/2019   Procedure: EXCISION OF LEFT MASTECTOMY SKIN;  Surgeon: Emelia Loron, MD;  Location: [facility];  Service: General;  Laterality: Left;   scoliosis  1972   harrington rods-age 89   TONSILLECTOMY  as child    I have reviewed the social history and family history with the patient and they are unchanged from previous note.  ALLERGIES:  has No Known Allergies.  MEDICATIONS:  Current Outpatient Medications  Medication Sig Dispense Refill   abemaciclib (VERZENIO) 100 MG tablet Take 1 tablet (100 mg total) by mouth 2 (two) times daily. 56 tablet 2   ALOE VERA PO Take 1 capsule by mouth daily.     anastrozole (ARIMIDEX) 1 MG tablet Take 1 tablet (1 mg total) by mouth daily. 30 tablet 1   Ascorbic Acid (VITAMIN C) 1000 MG tablet Take 1,000 mg by mouth daily.     Calcium Carb-Cholecalciferol (CALCIUM PLUS VITAMIN D3) 600-20 MG-MCG TABS Take 1 tablet by mouth 2 (two) times daily.     cholecalciferol (VITAMIN D3) 25 MCG (1000 UNIT) tablet Take 1,000 Units by mouth daily.     Coenzyme Q10 (COQ10) 200 MG CAPS Take 200 mg by mouth  daily.     denosumab (XGEVA) 120 MG/1.7ML SOLN injection Inject 120 mg into the skin every 3 (three) months.     doxycycline (VIBRA-TABS) 100 MG tablet Take 1 tablet (100 mg total) by mouth 2 (two) times daily. 14 tablet 0   Flaxseed Oil (LINSEED OIL) OIL Take 1,200 mg by mouth daily.     hydrochlorothiazide (HYDRODIURIL) 25 MG tablet Take 25 mg by mouth daily.     ibuprofen (ADVIL) 200 MG tablet Take 400 mg by mouth every 8 (eight) hours as needed for moderate pain.     lisinopril (ZESTRIL) 30 MG tablet Take 30 mg by mouth daily.     Magnesium 400 MG TABS Take 400 mg by mouth every evening.     Multiple Vitamin (MULTIVITAMIN) capsule Take 1 capsule by mouth daily.     Potassium 99 MG TABS Take 99 mg by mouth in the morning and at bedtime.     Probiotic Product (PROBIOTIC DAILY PO) Take 1 capsule by mouth 2 (two) times a week.  Turmeric 500 MG CAPS Take 500 mg by mouth 2 (two) times daily.     No current facility-administered medications for this visit.    PHYSICAL EXAMINATION: ECOG PERFORMANCE STATUS: 1 - Symptomatic but completely ambulatory  Vitals:   05/13/23 0835  BP: (!) 150/79  Pulse: (!) 109  Resp: 20  Temp: 98.9 F (37.2 C)  SpO2: 98%   Wt Readings from Last 3 Encounters:  05/13/23 164 lb 9.6 oz (74.7 kg)  04/27/23 165 lb (74.8 kg)  04/19/23 166 lb 8 oz (75.5 kg)     Constitutional: Oriented to person, place, and time and well-developed, well-nourished, and in no distress.  HENT:  Head: Normocephalic and atraumatic.  Eyes: Conjunctivae are normal. Right eye exhibits no discharge. Left eye exhibits no discharge. No scleral icterus.    Cardiovascular: Normal rate, regular rhythm, normal heart sounds Pulmonary/Chest: Effort normal and breath sounds normal. No respiratory distress. No wheezes. No rales.  Abdominal: Surgical incision site healing without erythema or discharge.  Musculoskeletal: Normal range of motion. Exhibits no edema.  Neurological: Alert and  oriented to person, place, and time. Exhibits normal muscle tone. Gait normal. Coordination normal.  Skin: Skin is warm and dry. No rash noted. Not diaphoretic. No erythema. No pallor.  Psychiatric: Mood, memory and judgment normal.    LABORATORY DATA:  I have reviewed the data as listed    Latest Ref Rng & Units 05/13/2023    8:04 AM 04/19/2023    9:25 AM 03/24/2023    7:53 AM  CBC  WBC 4.0 - 10.5 K/uL 6.1  2.4  2.7   Hemoglobin 12.0 - 15.0 g/dL 52.8  41.3  24.4   Hematocrit 36.0 - 46.0 % 31.5  33.1  33.7   Platelets 150 - 400 K/uL 346  247  215         Latest Ref Rng & Units 05/13/2023    8:04 AM 04/19/2023    9:25 AM 03/24/2023    7:53 AM  CMP  Glucose 70 - 99 mg/dL 010  272  81   BUN 8 - 23 mg/dL 10  9  12    Creatinine 0.44 - 1.00 mg/dL 5.36  6.44  0.34   Sodium 135 - 145 mmol/L 139  137  140   Potassium 3.5 - 5.1 mmol/L 3.7  3.4  3.4   Chloride 98 - 111 mmol/L 101  100  102   CO2 22 - 32 mmol/L 30  27  31    Calcium 8.9 - 10.3 mg/dL 9.5  8.9  9.6   Total Protein 6.5 - 8.1 g/dL 7.0   6.5   Total Bilirubin 0.3 - 1.2 mg/dL 0.4   0.5   Alkaline Phos 38 - 126 U/L 82   45   AST 15 - 41 U/L 20   23   ALT 0 - 44 U/L 34   27       RADIOGRAPHIC STUDIES: I have personally reviewed the radiological images as listed and agreed with the findings in the report. No results found.    ASSESSMENT:  LANIAH BURUM is a 62 y.o. female who presents to the clinic for follow up for metastatic breast cancer.   Metastatic Breast Cancer to bones, lobular carcinoma, ER+/PR+, HER2 IHC 2+ (low expression) -left breast cancer initially diagnosed 07/12/19. S/p left mastectomy on 09/27/19 showed stage IIIA p(T3, N1) ILC, SLND on 11/13/19 was negative (0/7). Mammaprint was low risk. -she received postmastectomy radiation 12/26/19 - 02/06/20 and  took tamoxifen 02/29/20 - 07/2020 -genetics 06/27/20 were negative. -she was found to have bony metastatic disease in 07/2020, confirmed with bone biopsy  08/06/20. -she began Xgeva and fulvestrant on 08/15/20 and palbociclib on 09/12/20. She tolerates well overall. -[employer] testing obtained on her 07/2020 biopsy showed no targetable mutation.  -PET scan on 12/22/2022 showed treated diffuse bone mets, no hypermetabolic uptake  -Due to high co-pay, Ilda Foil was switched to [facility] in February 2024 -continue Xgeva every 3 months  -Patient again wanted to change fulvestrant injection to oral antiestrogen therapy. [employer] testing was done which ruled out a ESR 1 mutation, so, we switched her to anastrozole on 04/09/2023.   PLAN: -Labs from today were reviewed and no intervention required. WBC 6.1, Hgb 11.2, Plt 346, Creatinine and LFTs normal. -Currently holding Verzenio as she is recovering from her abdominal hernia repair that was on 04/27/2023. -Due to report of ongoing low grade fever, advised to hold Verzenio. Will send 7 day course of doxycycline to treat for possible underlying infection.  -Surgical incision sites are healing without erythema or discharge noted.  -F/U in one week for telephone visit with [facility] NP.   No orders of the defined types were placed in this encounter.  All questions were answered. The patient knows to call the clinic with any problems, questions or concerns. No barriers to learning was detected.   I have spent a total of 30 minutes minutes of face-to-face and non-face-to-face time, preparing to see the patient, performing a medically appropriate examination, counseling and educating the patient, documenting clinical information in the electronic health record, independently interpreting results and communicating results to the patient, and care coordination.   Georga Kaufmann PA-C Dept of Hematology and Oncology [facility] Phone: [phone]

## 2023-05-14 ENCOUNTER — Encounter: Payer: Self-pay | Admitting: Hematology

## 2023-05-14 ENCOUNTER — Telehealth: Payer: Self-pay

## 2023-05-14 LAB — CANCER ANTIGEN 27.29: CA 27.29: 24.2 U/mL (ref 0.0–38.6)

## 2023-05-14 MED ORDER — DOXYCYCLINE HYCLATE 100 MG PO TABS: 100.0000 mg | ORAL_TABLET | Freq: Two times a day (BID) | ORAL | 0 refills | Status: DC

## 2023-05-14 NOTE — Telephone Encounter (Signed)
can you call patient and see if she is still have fevers? Per pt last night her temp was 99.9. This am 99.1 so she took 2 tylenol   Okay, please let her know that I'm going to send her antibiotics. She needs to monitor for any signs of infection.  Pt advised with VU. Please send RX to [facility] in [place].  Lacie: Can you do a telephone visit with her late next week (Thursday) to check in on her. She recently had abdominal wall hernia repair and has been holding her verzenio treatment. I saw her yesterday and told her to continue to hold until her fever resolves  Pt advised someone from scheduling would call to set up a phone visit with Heinz Knuckles for next week.

## 2023-05-19 ENCOUNTER — Inpatient Hospital Stay: Payer: BC Managed Care – PPO

## 2023-05-19 DIAGNOSIS — Z9889 Other specified postprocedural states: Secondary | ICD-10-CM | POA: Diagnosis not present

## 2023-05-20 ENCOUNTER — Inpatient Hospital Stay (HOSPITAL_BASED_OUTPATIENT_CLINIC_OR_DEPARTMENT_OTHER): Payer: BC Managed Care – PPO | Admitting: Nurse Practitioner

## 2023-05-20 ENCOUNTER — Encounter: Payer: Self-pay | Admitting: Nurse Practitioner

## 2023-05-20 DIAGNOSIS — C7951 Secondary malignant neoplasm of bone: Secondary | ICD-10-CM | POA: Diagnosis not present

## 2023-05-20 NOTE — Progress Notes (Signed)
Patient Care Team: Hamrick, Durward Fortes, MD as PCP - General (Family Medicine) Emelia Loron, MD as Consulting Physician (General Surgery) Pershing Proud, RN as Registered Nurse Donnelly Angelica, RN as Registered Nurse Dillingham, Alena Bills, DO as Attending Physician (Plastic Surgery) Antony Blackbird, MD as Consulting Physician (Radiation Oncology) Olivia Mackie, MD as Consulting Physician (Obstetrics and Gynecology) Anselm Lis, RPH-CPP (Pharmacist) Malachy Mood, MD as Consulting Physician (Medical Oncology)   I connected with Rodell Perna on 05/20/23 at  1:15 PM EDT by telephone visit and verified that I am speaking with the correct person using two identifiers.   I discussed the limitations, risks, security and privacy concerns of performing an evaluation and management service by telemedicine and the availability of in-person appointments. I also discussed with the patient that there may be a patient responsible charge related to this service. The patient expressed understanding and agreed to proceed.   Other persons participating in the visit and their role in the encounter: N/A   Patient's location: work   Provider's location: [facility] office   CHIEF COMPLAINT: Follow-up fever, metastatic breast cancer  Oncology History Overview Note   Cancer Staging  Malignant neoplasm metastatic to bone [facility]) Staging form: Bone - Appendicular Skeleton, Trunk, Skull, and Facial Bones, AJCC 8th Edition - Clinical: Stage IVB (cT3, cN1, cM1b) - Signed by Lowella Dell, MD on 08/15/2020  Malignant neoplasm of lower-outer quadrant of left breast of female, estrogen receptor positive (HCC) Staging form: Breast, AJCC 8th Edition - Clinical: Stage IIIA (cT3, cN1, cM0, G2, ER+, PR-, HER2-) - Signed by Lowella Dell, MD on 08/15/2020     Breast neoplasm, Tis (DCIS), right (Resolved)  05/16/2014 Initial Diagnosis   Breast neoplasm, Tis (DCIS), right   06/27/2020 Genetic Testing    Negative genetic testing on the CancerNext-Expanded+RNAinsight panel testing.  The CancerNext-Expanded gene panel offered by [employer] and includes sequencing and rearrangement analysis for the following 77 genes: AIP, ALK, APC*, ATM*, AXIN2, BAP1, BARD1, BLM, BMPR1A, BRCA1*, BRCA2*, BRIP1*, CDC73, CDH1*, CDK4, CDKN1B, CDKN2A, CHEK2*, CTNNA1, DICER1, FANCC, FH, FLCN, GALNT12, KIF1B, LZTR1, MAX, MEN1, MET, MLH1*, MSH2*, MSH3, MSH6*, MUTYH*, NBN, NF1*, NF2, NTHL1, PALB2*, PHOX2B, PMS2*, POT1, PRKAR1A, PTCH1, PTEN*, RAD51C*, RAD51D*, RB1, RECQL, RET, SDHA, SDHAF2, SDHB, SDHC, SDHD, SMAD4, SMARCA4, SMARCB1, SMARCE1, STK11, SUFU, TMEM127, TP53*, TSC1, TSC2, VHL and XRCC2 (sequencing and deletion/duplication); EGFR, EGLN1, HOXB13, KIT, MITF, PDGFRA, POLD1, and POLE (sequencing only); EPCAM and GREM1 (deletion/duplication only). DNA and RNA analyses performed for * genes. The report date is 06/27/2020.   Malignant neoplasm of lower-outer quadrant of left breast of female, estrogen receptor positive (HCC)  10/18/2019 Initial Diagnosis   Malignant neoplasm of lower-outer quadrant of left breast of female, estrogen receptor positive (HCC)   08/15/2020 Cancer Staging   Staging form: Breast, AJCC 8th Edition - Clinical: Stage IIIA (cT3, cN1, cM0, G2, ER+, PR-, HER2-) - Signed by Lowella Dell, MD on 08/15/2020   Malignant neoplasm metastatic to bone (HCC)  08/05/2020 Initial Diagnosis   Bone metastases (HCC)   08/15/2020 Cancer Staging   Staging form: Bone - Appendicular Skeleton, Trunk, Skull, and Facial Bones, AJCC 8th Edition - Clinical: Stage IVB (cT3, cN1, cM1b) - Signed by Lowella Dell, MD on 08/15/2020   12/21/2022 PET scan    IMPRESSION: Status post bilateral breast reconstruction with left axillary lymph node dissection.   Multifocal/diffuse treated osseous metastases, as above.   Otherwise, no evidence of recurrent or metastatic disease.  CURRENT THERAPY:  -denosumab  and fulvestrant q4weeks, started 08/15/20, change Xgeva to every 3 months in 12/2022 -Fulvestrant changed to anastrozole 04/09/23 -Verzenio 100 mg orally  2 times daily started 10/2022  INTERVAL HISTORY Ms. Bocock presents for phone follow-up as scheduled, last seen by my colleague Karena Addison, [place] 05/14/2023.  She was prescribed a course of doxycycline to cover possible infection.  She had a low-grade fever the following day, did not require fever-reducing meds, then resolved.  She will complete antibiotics tomorrow.  She is feeling well, back to work.  Saw her surgeon yesterday who felt her to be recovering very well.  Eating and drinking.  Tolerating anastrozole.  ROS  All other systems reviewed and negative  Past Medical History:  Diagnosis Date   Cancer (HCC)    breast cancer left 2021 roght 2015   Family history of brain cancer    Family history of breast cancer    Family history of colon cancer    Family history of stomach cancer    History of radiation therapy last done February 06 2020   Hypertension    PMB (postmenopausal bleeding)    PONV (postoperative nausea and vomiting)    likes scopolamine patch   Scoliosis    Wears glasses    Wears glasses      Past Surgical History:  Procedure Laterality Date   AUGMENTATION MAMMAPLASTY Right    2015 post mastectomy   AXILLARY LYMPH NODE DISSECTION Left 11/13/2019   Procedure: LEFT AXILLARY LYMPH NODE DISSECTION;  Surgeon: Emelia Loron, MD;  Location: [facility];  Service: General;  Laterality: Left;   BREAST IMPLANT EXCHANGE Right 03/19/2021   Procedure: exchange of right saline implant;  Surgeon: Peggye Form, DO;  Location: [facility];  Service: Plastics;  Laterality: Right;   BREAST RECONSTRUCTION WITH PLACEMENT OF TISSUE EXPANDER AND FLEX HD (ACELLULAR HYDRATED DERMIS) Right 05/16/2014   Procedure: IMMEDIATE RIGHT BREAST RECONSTRUCTION WITH PLACEMENT OF TISSUE EXPANDER AND FLEX HD (ACELLULAR HYDRATED  DERMIS);  Surgeon: Wayland Denis, DO;  Location: [facility] OR;  Service: Plastics;  Laterality: Right;   BREAST RECONSTRUCTION WITH PLACEMENT OF TISSUE EXPANDER AND FLEX HD (ACELLULAR HYDRATED DERMIS) Left 09/27/2019   Procedure: LEFT BREAST RECONSTRUCTION WITH PLACEMENT OF TISSUE EXPANDER AND FLEX HD (ACELLULAR HYDRATED DERMIS);  Surgeon: Peggye Form, DO;  Location: [facility];  Service: Plastics;  Laterality: Left;   BREAST SURGERY     right breast excisional biopsy   DILATATION & CURETTAGE/HYSTEROSCOPY WITH MYOSURE N/A 12/20/2020   Procedure: DILATATION & CURETTAGE/HYSTEROSCOPY WITH  MYOSURE;  Surgeon: Olivia Mackie, MD;  Location: [facility];  Service: Gynecology;  Laterality: N/A;   DILATION AND CURETTAGE OF UTERUS     HYSTEROSCOPY WITH D & C N/A 09/20/2015   Procedure: DILATATION AND CURETTAGE /HYSTEROSCOPY;  Surgeon: Olivia Mackie, MD;  Location: [facility];  Service: Gynecology;  Laterality: N/A;   INCISIONAL HERNIA REPAIR N/A 04/27/2023   Procedure: LAPAROSCOPIC ASSISTED INCISIONAL HERNIA WITH MESH;  Surgeon: Emelia Loron, MD;  Location: [facility] OR;  Service: General;  Laterality: N/A;  GEN/TAP BLOCK   LIPOSUCTION Bilateral 08/08/2014   Procedure: LIPO SUCTION ;  Surgeon: Wayland Denis, DO;  Location: [facility];  Service: Plastics;  Laterality: Bilateral;   MASTECTOMY Right 05/16/2014   placement of acellular dermal matrix & tissue expanders    MASTOPEXY Left 08/08/2014   Procedure:  MASTOPEXY FOR SYMMETRY;  Surgeon: Wayland Denis, DO;  Location:   SURGERY CENTER;  Service: Plastics;  Laterality: Left;   NIPPLE SPARING MASTECTOMY WITH SENTINEL LYMPH NODE BIOPSY Left 09/27/2019   Procedure: LEFT NIPPLE SPARING MASTECTOMY WITH LEFT AXILLARY SENTINEL LYMPH NODE BIOPSY;  Surgeon: Emelia Loron, MD;  Location: [facility];  Service: General;  Laterality: Left;   REDUCTION MAMMAPLASTY Left    2015   REMOVAL OF TISSUE  EXPANDER AND PLACEMENT OF IMPLANT Right 08/08/2014   Procedure: REMOVAL OF RIGHT  TISSUE EXPANDERS WITH PLACEMENT OF RIGHT BREAST IMPLANTS WITH LIPO SUCTION ;  Surgeon: Wayland Denis, DO;  Location: [facility];  Service: Plastics;  Laterality: Right;   REMOVAL OF TISSUE EXPANDER AND PLACEMENT OF IMPLANT Left 03/19/2021   Procedure: Removal of left expander for saline implant;  Surgeon: Peggye Form, DO;  Location: [facility];  Service: Plastics;  Laterality: Left;  2 hours   ROBOTIC ASSISTED TOTAL HYSTERECTOMY WITH BILATERAL SALPINGO OOPHERECTOMY Bilateral 02/06/2021   Procedure: XI ROBOTIC ASSISTED TOTAL HYSTERECTOMY WITH BILATERAL SALPINGO OOPHORECTOMY, LYSIS of SIGMOID ADHESIONS MCCALL CUL DE PLASTY;  Surgeon: Olivia Mackie, MD;  Location: [facility];  Service: Gynecology;  Laterality: Bilateral;   SCAR REVISION Left 11/13/2019   Procedure: EXCISION OF LEFT MASTECTOMY SKIN;  Surgeon: Emelia Loron, MD;  Location: [facility];  Service: General;  Laterality: Left;   scoliosis  1972   harrington rods-age 34   TONSILLECTOMY  as child     Outpatient Encounter Medications as of 05/20/2023  Medication Sig Note   abemaciclib (VERZENIO) 100 MG tablet Take 1 tablet (100 mg total) by mouth 2 (two) times daily. 04/19/2023: Off for 3 weeks during surgery   ALOE VERA PO Take 1 capsule by mouth daily.    anastrozole (ARIMIDEX) 1 MG tablet Take 1 tablet (1 mg total) by mouth daily.    Ascorbic Acid (VITAMIN C) 1000 MG tablet Take 1,000 mg by mouth daily.    Calcium Carb-Cholecalciferol (CALCIUM PLUS VITAMIN D3) 600-20 MG-MCG TABS Take 1 tablet by mouth 2 (two) times daily.    cholecalciferol (VITAMIN D3) 25 MCG (1000 UNIT) tablet Take 1,000 Units by mouth daily.    Coenzyme Q10 (COQ10) 200 MG CAPS Take 200 mg by mouth daily.    denosumab (XGEVA) 120 MG/1.7ML SOLN injection Inject 120 mg into the skin every 3 (three) months.    doxycycline  (VIBRA-TABS) 100 MG tablet Take 1 tablet (100 mg total) by mouth 2 (two) times daily.    Flaxseed Oil (LINSEED OIL) OIL Take 1,200 mg by mouth daily.    hydrochlorothiazide (HYDRODIURIL) 25 MG tablet Take 25 mg by mouth daily.    ibuprofen (ADVIL) 200 MG tablet Take 400 mg by mouth every 8 (eight) hours as needed for moderate pain.    lisinopril (ZESTRIL) 30 MG tablet Take 30 mg by mouth daily.    Magnesium 400 MG TABS Take 400 mg by mouth every evening.    Multiple Vitamin (MULTIVITAMIN) capsule Take 1 capsule by mouth daily.    Potassium 99 MG TABS Take 99 mg by mouth in the morning and at bedtime.    Probiotic Product (PROBIOTIC DAILY PO) Take 1 capsule by mouth 2 (two) times a week.    Turmeric 500 MG CAPS Take 500 mg by mouth 2 (two) times daily.    No facility-administered encounter medications on file as of 05/20/2023.     There were no vitals filed for this visit. There is no height or weight on file  to calculate BMI.   PHYSICAL EXAM Patient appears well by phone.  Mood/affect appear normal.  No cough or conversational dyspnea  CBC    Component Value Date/Time   WBC 6.1 05/13/2023 0804   WBC 2.4 (L) 04/19/2023 0925   RBC 3.13 (L) 05/13/2023 0804   HGB 11.2 (L) 05/13/2023 0804   HGB 13.8 07/30/2014 1603   HCT 31.5 (L) 05/13/2023 0804   HCT 41.6 07/30/2014 1603   PLT 346 05/13/2023 0804   PLT 317 07/30/2014 1603   MCV 100.6 (H) 05/13/2023 0804   MCV 93.4 07/30/2014 1603   MCH 35.8 (H) 05/13/2023 0804   MCHC 35.6 05/13/2023 0804   RDW 11.2 (L) 05/13/2023 0804   RDW 12.2 07/30/2014 1603   LYMPHSABS 1.0 05/13/2023 0804   LYMPHSABS 1.8 07/30/2014 1603   MONOABS 0.6 05/13/2023 0804   MONOABS 0.5 07/30/2014 1603   EOSABS 0.2 05/13/2023 0804   EOSABS 0.1 07/30/2014 1603   BASOSABS 0.1 05/13/2023 0804   BASOSABS 0.0 07/30/2014 1603     CMP     Component Value Date/Time   NA 139 05/13/2023 0804   NA 143 07/30/2014 1603   K 3.7 05/13/2023 0804   K 3.5 07/30/2014 1603    CL 101 05/13/2023 0804   CO2 30 05/13/2023 0804   CO2 30 (H) 07/30/2014 1603   GLUCOSE 122 (H) 05/13/2023 0804   GLUCOSE 93 07/30/2014 1603   BUN 10 05/13/2023 0804   BUN 10.5 07/30/2014 1603   CREATININE 0.79 05/13/2023 0804   CREATININE 0.9 07/30/2014 1603   CALCIUM 9.5 05/13/2023 0804   CALCIUM 9.5 07/30/2014 1603   PROT 7.0 05/13/2023 0804   PROT 7.0 07/30/2014 1603   ALBUMIN 3.9 05/13/2023 0804   ALBUMIN 4.0 07/30/2014 1603   AST 20 05/13/2023 0804   AST 23 07/30/2014 1603   ALT 34 05/13/2023 0804   ALT 23 07/30/2014 1603   ALKPHOS 82 05/13/2023 0804   ALKPHOS 78 07/30/2014 1603   BILITOT 0.4 05/13/2023 0804   BILITOT 0.29 07/30/2014 1603   GFRNONAA >60 05/13/2023 0804   GFRAA >60 04/29/2020 0829     ASSESSMENT & PLAN:Justeen W Bodenheimer is a 62 y.o. female with    Malignant neoplasm metastatic to bone [facility]) Metastatic Breast Cancer to bones, lobular carcinoma, ER+/PR+, HER2 IHC 2+ (low expression) -left breast cancer initially diagnosed 07/12/19. S/p left mastectomy on 09/27/19 showed stage IIIA p(T3, N1) ILC, SLND on 11/13/19 was negative (0/7). Mammaprint was low risk. -she received postmastectomy radiation 12/26/19 - 02/06/20 and took tamoxifen 02/29/20 - 07/2020 -genetics 06/27/20 were negative. -she was found to have bony metastatic disease in 07/2020, confirmed with bone biopsy 08/06/20. -she began Xgeva and fulvestrant on 08/15/20 and palbociclib on 09/12/20. She tolerates well overall. -[employer] testing obtained on her 07/2020 biopsy showed no targetable mutation.  -PET scan on 12/22/2022 showed treated diffuse bone mets, no hypermetabolic uptake  -Due to high co-pay, Ilda Foil was switched to [employer] in February 2024, she is tolerating well with low-dose 100 mg twice daily, with occasional diarrhea. -continue Xgeva every 3 months  -[employer] negative for ESR 1 mutation, so she switched fulvestrant to Anastrozole in 04/2023. -S/p abdominal hernia repair by Dr.  Dwain Sarna, she had post op fevers, treated with doxy, and has recovered well -She can resume Verzenio 05/24/23, continue Anastrozole -PET 10/7, then f/up and Xgeva 10/16 as scheduled      PLAN: -Resume Verzenio 100 mg BID 05/24/23, continue Anastrozole -PET 10/7 -Lab, f/up, and  Rivka Barbara 10/16 as scheduled   I discussed the assessment and treatment plan with the patient. The patient was provided an opportunity to ask questions and all were answered. The patient agreed with the plan and demonstrated an understanding of the instructions.   The patient was advised to call back or seek an in-person evaluation if the symptoms worsen or if the condition fails to improve as anticipated.   Santiago Glad, NP-C 05/20/2023

## 2023-05-21 ENCOUNTER — Encounter: Payer: Self-pay | Admitting: Hematology

## 2023-06-02 ENCOUNTER — Other Ambulatory Visit: Payer: Self-pay | Admitting: Hematology

## 2023-06-07 ENCOUNTER — Encounter (HOSPITAL_COMMUNITY)
Admission: RE | Admit: 2023-06-07 | Discharge: 2023-06-07 | Disposition: A | Discharge status: Home / Self Care | Payer: BC Managed Care – PPO | Source: Ambulatory Visit | Attending: Hematology | Admitting: Hematology

## 2023-06-07 DIAGNOSIS — C7951 Secondary malignant neoplasm of bone: Secondary | ICD-10-CM | POA: Insufficient documentation

## 2023-06-07 LAB — GLUCOSE, CAPILLARY: Glucose-Capillary: 118 mg/dL — ABNORMAL HIGH (ref 70–99)

## 2023-06-07 MED ORDER — FLUDEOXYGLUCOSE F - 18 (FDG) INJECTION
8.1700 | Freq: Once | INTRAVENOUS | Status: AC
  Administered 2023-06-07: 8.17 via INTRAVENOUS

## 2023-06-08 ENCOUNTER — Other Ambulatory Visit (HOSPITAL_COMMUNITY): Payer: Self-pay | Admitting: Pharmacy Technician

## 2023-06-08 ENCOUNTER — Other Ambulatory Visit (HOSPITAL_COMMUNITY): Payer: Self-pay

## 2023-06-08 NOTE — Progress Notes (Signed)
Specialty Pharmacy Refill Coordination Note  Natasha Graham is a 62 y.o. female contacted today regarding refills of specialty medication(s) Abemaciclib   Patient requested [name] at [facility] at [place] date: 06/16/23   Medication will be filled on 06/15/23.

## 2023-06-14 ENCOUNTER — Telehealth: Payer: Self-pay

## 2023-06-14 NOTE — Telephone Encounter (Signed)
[facility] to expedite read on pt's PET Scan.

## 2023-06-15 NOTE — Assessment & Plan Note (Signed)
Metastatic Breast Cancer to bones, lobular carcinoma, ER+/PR+, HER2 IHC 2+ (low expression) -left breast cancer initially diagnosed 07/12/19. S/p left mastectomy on 09/27/19 showed stage IIIA p(T3, N1) ILC, SLND on 11/13/19 was negative (0/7). Mammaprint was low risk. -she received postmastectomy radiation 12/26/19 - 02/06/20 and took tamoxifen 02/29/20 - 07/2020 -genetics 06/27/20 were negative. -she was found to have bony metastatic disease in 07/2020, confirmed with bone biopsy 08/06/20. -she began Xgeva and fulvestrant on 08/15/20 and palbociclib on 09/12/20. She tolerates well overall. -[employer] testing obtained on her 07/2020 biopsy showed no targetable mutation.  -PET scan on 12/22/2022 showed treated diffuse bone mets, no hypermetabolic uptake  -Due to high co-pay, Ilda Foil was switched to [facility] in February 2024, she is tolerating well with low-dose 100 mg twice daily, with occasional diarrhea. -continue Xgeva every 3 months

## 2023-06-16 ENCOUNTER — Inpatient Hospital Stay (HOSPITAL_BASED_OUTPATIENT_CLINIC_OR_DEPARTMENT_OTHER): Payer: BC Managed Care – PPO | Admitting: Hematology

## 2023-06-16 ENCOUNTER — Other Ambulatory Visit (HOSPITAL_COMMUNITY): Payer: Self-pay

## 2023-06-16 ENCOUNTER — Other Ambulatory Visit: Payer: Self-pay

## 2023-06-16 ENCOUNTER — Inpatient Hospital Stay: Payer: BC Managed Care – PPO

## 2023-06-16 ENCOUNTER — Inpatient Hospital Stay: Payer: BC Managed Care – PPO | Admitting: Hematology

## 2023-06-16 ENCOUNTER — Inpatient Hospital Stay: Payer: BC Managed Care – PPO | Attending: Hematology

## 2023-06-16 VITALS — BP 158/79 | HR 104 | Temp 98.2°F | Resp 18 | Ht 64.0 in | Wt 166.4 lb

## 2023-06-16 DIAGNOSIS — C7951 Secondary malignant neoplasm of bone: Secondary | ICD-10-CM

## 2023-06-16 DIAGNOSIS — Z170 Estrogen receptor positive status [ER+]: Secondary | ICD-10-CM | POA: Insufficient documentation

## 2023-06-16 DIAGNOSIS — Z79811 Long term (current) use of aromatase inhibitors: Secondary | ICD-10-CM | POA: Diagnosis not present

## 2023-06-16 DIAGNOSIS — C50512 Malignant neoplasm of lower-outer quadrant of left female breast: Secondary | ICD-10-CM | POA: Diagnosis not present

## 2023-06-16 LAB — CBC WITH DIFFERENTIAL (CANCER CENTER ONLY)
Abs Immature Granulocytes: 0.01 10*3/uL (ref 0.00–0.07)
Basophils Absolute: 0 10*3/uL (ref 0.0–0.1)
Basophils Relative: 1 %
Eosinophils Absolute: 0 10*3/uL (ref 0.0–0.5)
Eosinophils Relative: 1 %
HCT: 34.9 % — ABNORMAL LOW (ref 36.0–46.0)
Hemoglobin: 12.4 g/dL (ref 12.0–15.0)
Immature Granulocytes: 0 %
Lymphocytes Relative: 38 %
Lymphs Abs: 0.9 10*3/uL (ref 0.7–4.0)
MCH: 35.3 pg — ABNORMAL HIGH (ref 26.0–34.0)
MCHC: 35.5 g/dL (ref 30.0–36.0)
MCV: 99.4 fL (ref 80.0–100.0)
Monocytes Absolute: 0.2 10*3/uL (ref 0.1–1.0)
Monocytes Relative: 8 %
Neutro Abs: 1.3 10*3/uL — ABNORMAL LOW (ref 1.7–7.7)
Neutrophils Relative %: 52 %
Platelet Count: 217 10*3/uL (ref 150–400)
RBC: 3.51 MIL/uL — ABNORMAL LOW (ref 3.87–5.11)
RDW: 11.9 % (ref 11.5–15.5)
WBC Count: 2.5 10*3/uL — ABNORMAL LOW (ref 4.0–10.5)
nRBC: 0 % (ref 0.0–0.2)

## 2023-06-16 LAB — CMP (CANCER CENTER ONLY)
ALT: 26 U/L (ref 0–44)
AST: 22 U/L (ref 15–41)
Albumin: 3.9 g/dL (ref 3.5–5.0)
Alkaline Phosphatase: 46 U/L (ref 38–126)
Anion gap: 13 (ref 5–15)
BUN: 11 mg/dL (ref 8–23)
CO2: 28 mmol/L (ref 22–32)
Calcium: 9.7 mg/dL (ref 8.9–10.3)
Chloride: 99 mmol/L (ref 98–111)
Creatinine: 1.02 mg/dL — ABNORMAL HIGH (ref 0.44–1.00)
GFR, Estimated: >60 mL/min (ref >60)
Glucose, Bld: 107 mg/dL — ABNORMAL HIGH (ref 70–99)
Potassium: 3.6 mmol/L (ref 3.5–5.1)
Sodium: 140 mmol/L (ref 135–145)
Total Bilirubin: 0.7 mg/dL (ref 0.3–1.2)
Total Protein: 7 g/dL (ref 6.5–8.1)

## 2023-06-16 MED ORDER — ANASTROZOLE 1 MG PO TABS: 1.0000 mg | ORAL_TABLET | Freq: Every day | ORAL | 1 refills | Status: DC

## 2023-06-16 MED ORDER — ABEMACICLIB 100 MG PO TABS
100.0000 mg | ORAL_TABLET | Freq: Two times a day (BID) | ORAL | 2 refills | Status: DC
  Filled 2023-06-16 – 2023-07-19 (×2): qty 56, 28d supply, fill #0
  Filled 2023-08-10: qty 56, 28d supply, fill #1
  Filled 2023-09-07: qty 56, 28d supply, fill #2

## 2023-06-16 MED ORDER — DENOSUMAB 120 MG/1.7ML ~~LOC~~ SOLN
120.0000 mg | Freq: Once | SUBCUTANEOUS | Status: AC
  Administered 2023-06-16: 120 mg via SUBCUTANEOUS
  Filled 2023-06-16: qty 1.7

## 2023-06-16 NOTE — Progress Notes (Signed)
[facility]   [phone] [phone]   Clinic Follow up Note   Patient Care Team: Hamrick, Durward Fortes, MD as PCP - General (Family Medicine) Emelia Loron, MD as Consulting Physician (General Surgery) Pershing Proud, RN as Registered Nurse Donnelly Angelica, RN as Registered Nurse Dillingham, Alena Bills, DO as Attending Physician (Plastic Surgery) Antony Blackbird, MD as Consulting Physician (Radiation Oncology) Olivia Mackie, MD as Consulting Physician (Obstetrics and Gynecology) Anselm Lis, RPH-CPP (Pharmacist) Malachy Mood, MD as Consulting Physician (Medical Oncology)  Date of Service:  06/16/2023  CHIEF COMPLAINT: f/u of metastatic breast cancer  CURRENT THERAPY:  Anastrozole and Verzenio  Oncology History   Malignant neoplasm metastatic to bone [facility]) Metastatic Breast Cancer to bones, lobular carcinoma, ER+/PR+, HER2 IHC 2+ (low expression) -left breast cancer initially diagnosed 07/12/19. S/p left mastectomy on 09/27/19 showed stage IIIA p(T3, N1) ILC, SLND on 11/13/19 was negative (0/7). Mammaprint was low risk. -she received postmastectomy radiation 12/26/19 - 02/06/20 and took tamoxifen 02/29/20 - 07/2020 -genetics 06/27/20 were negative. -she was found to have bony metastatic disease in 07/2020, confirmed with bone biopsy 08/06/20. -she began Xgeva and fulvestrant on 08/15/20 and palbociclib on 09/12/20. She tolerates well overall. -[employer] testing obtained on her 07/2020 biopsy showed no targetable mutation.  -PET scan on 12/22/2022 showed treated diffuse bone mets, no hypermetabolic uptake  -Due to high co-pay, Ilda Foil was switched to [place] in February 2024, she is tolerating well with low-dose 100 mg twice daily, with occasional diarrhea. -continue Xgeva every 3 months     Assessment and Plan    Metastatic Breast Cancer Stable disease on anastrozole and Verzenio 100mg  daily with intermittent diarrhea.  I personally reviewed  her PET scan images with patient, which shows no active disease. Tumor marker (CA 27.29) previously low at 24, baseline CA 7.29 with significantly elevated. -Continue Verzenio 100mg  daily. -Refill Verzenio prescription. -Check tumor marker in 3 months. -Plan for PET scan in 6 months unless tumor marker increases significantly.  Post-Operative Hernia Repair Healed well with minor skin reaction to surgical glue and transient fever managed with antibiotics. -No further action required.  Bone metastasis Receiving Xgeva injections every 3 months for bone metastasis. -Continue Xgeva injections every 3 months.  Follow-up -Lab and PET scan reviewed, will proceed Xgeva injection today and continue every 3 months -Schedule follow-up appointment in 3 months.     SUMMARY OF ONCOLOGIC HISTORY: Oncology History Overview Note   Cancer Staging  Malignant neoplasm metastatic to bone [facility]) Staging form: Bone - Appendicular Skeleton, Trunk, Skull, and Facial Bones, AJCC 8th Edition - Clinical: Stage IVB (cT3, cN1, cM1b) - Signed by Lowella Dell, MD on 08/15/2020  Malignant neoplasm of lower-outer quadrant of left breast of female, estrogen receptor positive (HCC) Staging form: Breast, AJCC 8th Edition - Clinical: Stage IIIA (cT3, cN1, cM0, G2, ER+, PR-, HER2-) - Signed by Lowella Dell, MD on 08/15/2020     Breast neoplasm, Tis (DCIS), right (Resolved)  05/16/2014 Initial Diagnosis   Breast neoplasm, Tis (DCIS), right   06/27/2020 Genetic Testing   Negative genetic testing on the CancerNext-Expanded+RNAinsight panel testing.  The CancerNext-Expanded gene panel offered by [facility] and includes sequencing and rearrangement analysis for the following 77 genes: AIP, ALK, APC*, ATM*, AXIN2, BAP1, BARD1, BLM, BMPR1A, BRCA1*, BRCA2*, BRIP1*, CDC73, CDH1*, CDK4, CDKN1B, CDKN2A, CHEK2*, CTNNA1, DICER1, FANCC, FH, FLCN, GALNT12, KIF1B, LZTR1, MAX, MEN1, MET, MLH1*, MSH2*, MSH3, MSH6*,  MUTYH*, NBN, NF1*, NF2, NTHL1, PALB2*, PHOX2B,  PMS2*, POT1, PRKAR1A, PTCH1, PTEN*, RAD51C*, RAD51D*, RB1, RECQL, RET, SDHA, SDHAF2, SDHB, SDHC, SDHD, SMAD4, SMARCA4, SMARCB1, SMARCE1, STK11, SUFU, TMEM127, TP53*, TSC1, TSC2, VHL and XRCC2 (sequencing and deletion/duplication); EGFR, EGLN1, HOXB13, KIT, MITF, PDGFRA, POLD1, and POLE (sequencing only); EPCAM and GREM1 (deletion/duplication only). DNA and RNA analyses performed for * genes. The report date is 06/27/2020.   Malignant neoplasm of lower-outer quadrant of left breast of female, estrogen receptor positive (HCC)  10/18/2019 Initial Diagnosis   Malignant neoplasm of lower-outer quadrant of left breast of female, estrogen receptor positive (HCC)   08/15/2020 Cancer Staging   Staging form: Breast, AJCC 8th Edition - Clinical: Stage IIIA (cT3, cN1, cM0, G2, ER+, PR-, HER2-) - Signed by Lowella Dell, MD on 08/15/2020   Malignant neoplasm metastatic to bone (HCC)  08/05/2020 Initial Diagnosis   Bone metastases (HCC)   08/15/2020 Cancer Staging   Staging form: Bone - Appendicular Skeleton, Trunk, Skull, and Facial Bones, AJCC 8th Edition - Clinical: Stage IVB (cT3, cN1, cM1b) - Signed by Lowella Dell, MD on 08/15/2020   12/21/2022 PET scan    IMPRESSION: Status post bilateral breast reconstruction with left axillary lymph node dissection.   Multifocal/diffuse treated osseous metastases, as above.   Otherwise, no evidence of recurrent or metastatic disease.        Discussed the use of AI scribe software for clinical note transcription with the patient, who gave verbal consent to proceed.  History of Present Illness   A 62 year old patient with a history of metastatic breast cancer presents for a follow-up visit. The patient has been on Verzenio, which was paused for a hernia repair surgery. The patient reports that Verzenio may have been causing some stomach discomfort, but it is manageable. The patient underwent a hernia  repair surgery, which was initially painful but has since healed well. However, the patient experienced a skin reaction to the surgical glue used in the procedure, which required treatment with cortisone cream. The patient also developed a fever post-surgery, which was successfully managed with antibiotics. The patient's recent PET scan shows no active disease, indicating that the cancer is well controlled. The patient also reports occasional flare-ups of diverticulitis.         All other systems were reviewed with the patient and are negative.  MEDICAL HISTORY:  Past Medical History:  Diagnosis Date   Cancer [facility])    breast cancer left 2021 roght 2015   Family history of brain cancer    Family history of breast cancer    Family history of colon cancer    Family history of stomach cancer    History of radiation therapy last done February 06 2020   Hypertension    PMB (postmenopausal bleeding)    PONV (postoperative nausea and vomiting)    likes scopolamine patch   Scoliosis    Wears glasses    Wears glasses     SURGICAL HISTORY: Past Surgical History:  Procedure Laterality Date   AUGMENTATION MAMMAPLASTY Right    2015 post mastectomy   AXILLARY LYMPH NODE DISSECTION Left 11/13/2019   Procedure: LEFT AXILLARY LYMPH NODE DISSECTION;  Surgeon: Emelia Loron, MD;  Location: [facility];  Service: General;  Laterality: Left;   BREAST IMPLANT EXCHANGE Right 03/19/2021   Procedure: exchange of right saline implant;  Surgeon: Peggye Form, DO;  Location: [facility];  Service: Plastics;  Laterality: Right;   BREAST RECONSTRUCTION WITH PLACEMENT OF TISSUE EXPANDER AND FLEX HD (ACELLULAR HYDRATED  DERMIS) Right 05/16/2014   Procedure: IMMEDIATE RIGHT BREAST RECONSTRUCTION WITH PLACEMENT OF TISSUE EXPANDER AND FLEX HD (ACELLULAR HYDRATED DERMIS);  Surgeon: Wayland Denis, DO;  Location: [facility];  Service: Plastics;  Laterality: Right;   BREAST RECONSTRUCTION  WITH PLACEMENT OF TISSUE EXPANDER AND FLEX HD (ACELLULAR HYDRATED DERMIS) Left 09/27/2019   Procedure: LEFT BREAST RECONSTRUCTION WITH PLACEMENT OF TISSUE EXPANDER AND FLEX HD (ACELLULAR HYDRATED DERMIS);  Surgeon: Peggye Form, DO;  Location: [facility];  Service: Plastics;  Laterality: Left;   BREAST SURGERY     right breast excisional biopsy   DILATATION & CURETTAGE/HYSTEROSCOPY WITH MYOSURE N/A 12/20/2020   Procedure: DILATATION & CURETTAGE/HYSTEROSCOPY WITH  MYOSURE;  Surgeon: Olivia Mackie, MD;  Location: [facility];  Service: Gynecology;  Laterality: N/A;   DILATION AND CURETTAGE OF UTERUS     HYSTEROSCOPY WITH D & C N/A 09/20/2015   Procedure: DILATATION AND CURETTAGE /HYSTEROSCOPY;  Surgeon: Olivia Mackie, MD;  Location: [facility];  Service: Gynecology;  Laterality: N/A;   INCISIONAL HERNIA REPAIR N/A 04/27/2023   Procedure: LAPAROSCOPIC ASSISTED INCISIONAL HERNIA WITH MESH;  Surgeon: Emelia Loron, MD;  Location: [facility] OR;  Service: General;  Laterality: N/A;  GEN/TAP BLOCK   LIPOSUCTION Bilateral 08/08/2014   Procedure: LIPO SUCTION ;  Surgeon: Wayland Denis, DO;  Location: [facility];  Service: Plastics;  Laterality: Bilateral;   MASTECTOMY Right 05/16/2014   placement of acellular dermal matrix & tissue expanders    MASTOPEXY Left 08/08/2014   Procedure:  MASTOPEXY FOR SYMMETRY;  Surgeon: Wayland Denis, DO;  Location: [facility];  Service: Plastics;  Laterality: Left;   NIPPLE SPARING MASTECTOMY WITH SENTINEL LYMPH NODE BIOPSY Left 09/27/2019   Procedure: LEFT NIPPLE SPARING MASTECTOMY WITH LEFT AXILLARY SENTINEL LYMPH NODE BIOPSY;  Surgeon: Emelia Loron, MD;  Location: [facility];  Service: General;  Laterality: Left;   REDUCTION MAMMAPLASTY Left    2015   REMOVAL OF TISSUE EXPANDER AND PLACEMENT OF IMPLANT Right 08/08/2014   Procedure: REMOVAL OF RIGHT  TISSUE EXPANDERS WITH PLACEMENT OF RIGHT  BREAST IMPLANTS WITH LIPO SUCTION ;  Surgeon: Wayland Denis, DO;  Location: [facility];  Service: Plastics;  Laterality: Right;   REMOVAL OF TISSUE EXPANDER AND PLACEMENT OF IMPLANT Left 03/19/2021   Procedure: Removal of left expander for saline implant;  Surgeon: Peggye Form, DO;  Location: [facility];  Service: Plastics;  Laterality: Left;  2 hours   ROBOTIC ASSISTED TOTAL HYSTERECTOMY WITH BILATERAL SALPINGO OOPHERECTOMY Bilateral 02/06/2021   Procedure: XI ROBOTIC ASSISTED TOTAL HYSTERECTOMY WITH BILATERAL SALPINGO OOPHORECTOMY, LYSIS of SIGMOID ADHESIONS MCCALL CUL DE PLASTY;  Surgeon: Olivia Mackie, MD;  Location: [facility];  Service: Gynecology;  Laterality: Bilateral;   SCAR REVISION Left 11/13/2019   Procedure: EXCISION OF LEFT MASTECTOMY SKIN;  Surgeon: Emelia Loron, MD;  Location: [facility];  Service: General;  Laterality: Left;   scoliosis  1972   harrington rods-age 43   TONSILLECTOMY  as child    I have reviewed the social history and family history with the patient and they are unchanged from previous note.  ALLERGIES:  has No Known Allergies.  MEDICATIONS:  Current Outpatient Medications  Medication Sig Dispense Refill   abemaciclib (VERZENIO) 100 MG tablet Take 1 tablet (100 mg total) by mouth 2 (two) times daily. 56 tablet 2   ALOE VERA PO Take 1 capsule by mouth daily.  anastrozole (ARIMIDEX) 1 MG tablet Take 1 tablet (1 mg total) by mouth daily. 90 tablet 1   Ascorbic Acid (VITAMIN C) 1000 MG tablet Take 1,000 mg by mouth daily.     Calcium Carb-Cholecalciferol (CALCIUM PLUS VITAMIN D3) 600-20 MG-MCG TABS Take 1 tablet by mouth 2 (two) times daily.     cholecalciferol (VITAMIN D3) 25 MCG (1000 UNIT) tablet Take 1,000 Units by mouth daily.     Coenzyme Q10 (COQ10) 200 MG CAPS Take 200 mg by mouth daily.     denosumab (XGEVA) 120 MG/1.7ML SOLN injection Inject 120 mg into the skin every 3  (three) months.     doxycycline (VIBRA-TABS) 100 MG tablet Take 1 tablet (100 mg total) by mouth 2 (two) times daily. 14 tablet 0   Flaxseed Oil (LINSEED OIL) OIL Take 1,200 mg by mouth daily.     hydrochlorothiazide (HYDRODIURIL) 25 MG tablet Take 25 mg by mouth daily.     ibuprofen (ADVIL) 200 MG tablet Take 400 mg by mouth every 8 (eight) hours as needed for moderate pain.     lisinopril (ZESTRIL) 30 MG tablet Take 30 mg by mouth daily.     Magnesium 400 MG TABS Take 400 mg by mouth every evening.     Multiple Vitamin (MULTIVITAMIN) capsule Take 1 capsule by mouth daily.     Potassium 99 MG TABS Take 99 mg by mouth in the morning and at bedtime.     Probiotic Product (PROBIOTIC DAILY PO) Take 1 capsule by mouth 2 (two) times a week.     Turmeric 500 MG CAPS Take 500 mg by mouth 2 (two) times daily.     No current facility-administered medications for this visit.    PHYSICAL EXAMINATION: ECOG PERFORMANCE STATUS: 0 - Asymptomatic  Vitals:   06/16/23 0826  BP: (!) 158/79  Pulse: (!) 104  Resp: 18  Temp: 98.2 F (36.8 C)  SpO2: 100%   Wt Readings from Last 3 Encounters:  06/16/23 166 lb 6.4 oz (75.5 kg)  05/13/23 164 lb 9.6 oz (74.7 kg)  04/27/23 165 lb (74.8 kg)     GENERAL:alert, no distress and comfortable SKIN: skin color, texture, turgor are normal, no rashes or significant lesions EYES: normal, Conjunctiva are pink and non-injected, sclera clear Musculoskeletal:no cyanosis of digits and no clubbing  NEURO: alert & oriented x 3 with fluent speech, no focal motor/sensory deficits    LABORATORY DATA:  I have reviewed the data as listed    Latest Ref Rng & Units 06/16/2023    8:09 AM 05/13/2023    8:04 AM 04/19/2023    9:25 AM  CBC  WBC 4.0 - 10.5 K/uL 2.5  6.1  2.4   Hemoglobin 12.0 - 15.0 g/dL 16.1  09.6  04.5   Hematocrit 36.0 - 46.0 % 34.9  31.5  33.1   Platelets 150 - 400 K/uL 217  346  247         Latest Ref Rng & Units 05/13/2023    8:04 AM 04/19/2023     9:25 AM 03/24/2023    7:53 AM  CMP  Glucose 70 - 99 mg/dL 409  811  81   BUN 8 - 23 mg/dL 10  9  12    Creatinine 0.44 - 1.00 mg/dL 9.14  7.82  9.56   Sodium 135 - 145 mmol/L 139  137  140   Potassium 3.5 - 5.1 mmol/L 3.7  3.4  3.4   Chloride 98 - 111 mmol/L  101  100  102   CO2 22 - 32 mmol/L 30  27  31    Calcium 8.9 - 10.3 mg/dL 9.5  8.9  9.6   Total Protein 6.5 - 8.1 g/dL 7.0   6.5   Total Bilirubin 0.3 - 1.2 mg/dL 0.4   0.5   Alkaline Phos 38 - 126 U/L 82   45   AST 15 - 41 U/L 20   23   ALT 0 - 44 U/L 34   27       RADIOGRAPHIC STUDIES: I have personally reviewed the radiological images as listed and agreed with the findings in the report. No results found.    No orders of the defined types were placed in this encounter.  All questions were answered. The patient knows to call the clinic with any problems, questions or concerns. No barriers to learning was detected. The total time spent in the appointment was 30 minutes.     Malachy Mood, MD 06/16/2023

## 2023-06-17 LAB — CANCER ANTIGEN 27.29: CA 27.29: 29.7 U/mL (ref 0.0–38.6)

## 2023-06-24 NOTE — Plan of Care (Signed)
[facility] Tonsillectomy/Adenoidectomy, Postoperative PEDS care plan entered in error.

## 2023-07-05 ENCOUNTER — Other Ambulatory Visit: Payer: Self-pay

## 2023-07-09 DIAGNOSIS — D2271 Melanocytic nevi of right lower limb, including hip: Secondary | ICD-10-CM | POA: Diagnosis not present

## 2023-07-09 DIAGNOSIS — D2272 Melanocytic nevi of left lower limb, including hip: Secondary | ICD-10-CM | POA: Diagnosis not present

## 2023-07-09 DIAGNOSIS — L304 Erythema intertrigo: Secondary | ICD-10-CM | POA: Diagnosis not present

## 2023-07-09 DIAGNOSIS — D225 Melanocytic nevi of trunk: Secondary | ICD-10-CM | POA: Diagnosis not present

## 2023-07-16 DIAGNOSIS — I10 Essential (primary) hypertension: Secondary | ICD-10-CM | POA: Diagnosis not present

## 2023-07-16 DIAGNOSIS — E7800 Pure hypercholesterolemia, unspecified: Secondary | ICD-10-CM | POA: Diagnosis not present

## 2023-07-16 DIAGNOSIS — R7303 Prediabetes: Secondary | ICD-10-CM | POA: Diagnosis not present

## 2023-07-16 DIAGNOSIS — I700 Atherosclerosis of aorta: Secondary | ICD-10-CM | POA: Diagnosis not present

## 2023-07-19 ENCOUNTER — Other Ambulatory Visit (HOSPITAL_COMMUNITY): Payer: Self-pay

## 2023-07-19 NOTE — Progress Notes (Signed)
Specialty Pharmacy Refill Coordination Note  Natasha Graham is a 62 y.o. female contacted today regarding refills of specialty medication(s) Abemaciclib   Patient requested [name] at [facility] at [place] date: 07/23/23   Medication will be filled on 07/22/23.

## 2023-07-19 NOTE — Progress Notes (Signed)
Specialty Pharmacy Ongoing Clinical Assessment Note  Natasha Graham is a 62 y.o. female who is being followed by the specialty pharmacy service for RxSp Oncology   Patient's specialty medication(s) reviewed today: Abemaciclib   Missed doses in the last 4 weeks: All (medication was held for 5 weeks due to surgery, pt is back on therapy now with no missed doses.)   Patient/Caregiver did not have any additional questions or concerns.   Therapeutic benefit summary: Patient is achieving benefit   Adverse events/side effects summary: Experienced adverse events/side effects (diarrhea, has improved over time and is tolerable without the need for treatment at this time)   Patient's therapy is appropriate to: Continue    Goals Addressed             This Visit's Progress    Slow Disease Progression       Patient is on track. Patient will maintain adherence.  Dr. Mosetta Putt documented that the most recent PET showed well-controlled disease.          Follow up:  6 months  Servando Snare Specialty Pharmacist

## 2023-07-22 ENCOUNTER — Other Ambulatory Visit (HOSPITAL_COMMUNITY): Payer: Self-pay

## 2023-07-23 ENCOUNTER — Other Ambulatory Visit (HOSPITAL_COMMUNITY): Payer: Self-pay

## 2023-08-10 ENCOUNTER — Other Ambulatory Visit (HOSPITAL_COMMUNITY): Payer: Self-pay

## 2023-08-10 ENCOUNTER — Other Ambulatory Visit (HOSPITAL_COMMUNITY): Payer: Self-pay | Admitting: Pharmacy Technician

## 2023-08-10 DIAGNOSIS — Z01419 Encounter for gynecological examination (general) (routine) without abnormal findings: Secondary | ICD-10-CM | POA: Diagnosis not present

## 2023-08-10 NOTE — Progress Notes (Signed)
Specialty Pharmacy Refill Coordination Note  Natasha Graham is a 62 y.o. female contacted today regarding refills of specialty medication(s) Abemaciclib   Patient requested [name] at [facility] at [place] date: 08/20/23   Medication will be filled on 08/19/23.

## 2023-08-19 ENCOUNTER — Other Ambulatory Visit: Payer: Self-pay

## 2023-08-20 ENCOUNTER — Other Ambulatory Visit (HOSPITAL_COMMUNITY): Payer: Self-pay

## 2023-09-07 ENCOUNTER — Other Ambulatory Visit: Payer: Self-pay

## 2023-09-07 NOTE — Progress Notes (Signed)
 Specialty Pharmacy Refill Coordination Note  Natasha Graham is a 63 y.o. female contacted today regarding refills of specialty medication(s) Abemaciclib  (VERZENIO )   Patient requested [name] at [facility] at [place] date: 09/14/23   Medication will be filled on 09/13/23.

## 2023-09-13 ENCOUNTER — Telehealth: Payer: Self-pay | Admitting: Pharmacy Technician

## 2023-09-13 ENCOUNTER — Other Ambulatory Visit (HOSPITAL_COMMUNITY): Payer: Self-pay

## 2023-09-13 ENCOUNTER — Other Ambulatory Visit: Payer: Self-pay

## 2023-09-13 ENCOUNTER — Encounter: Payer: Self-pay | Admitting: Hematology

## 2023-09-13 NOTE — Progress Notes (Signed)
 Medication order did not come in yet due to weather. Spoke with patient and she will pick up 1/15 AM since she has an appointment. Will call her back if any further delays.

## 2023-09-13 NOTE — Telephone Encounter (Signed)
 Oral Oncology Patient Advocate Encounter   Was successful in obtaining a copay card for Verzenio .  This copay card will make the patients copay $0.  The billing information is as follows and has been shared with [facility].   RxBin: [id number] PCN: [id number] Member ID: [id number] Group ID: [id number]   Estefana Moellers, CPhT-Adv Oncology Pharmacy Patient Advocate [facility] Direct Number: [phone]  Fax: [phone]

## 2023-09-13 NOTE — Assessment & Plan Note (Signed)
Metastatic Breast Cancer to bones, lobular carcinoma, ER+/PR+, HER2 IHC 2+ (low expression) -left breast cancer initially diagnosed 07/12/19. S/p left mastectomy on 09/27/19 showed stage IIIA p(T3, N1) ILC, SLND on 11/13/19 was negative (0/7). Mammaprint was low risk. -she received postmastectomy radiation 12/26/19 - 02/06/20 and took tamoxifen 02/29/20 - 07/2020 -genetics 06/27/20 were negative. -she was found to have bony metastatic disease in 07/2020, confirmed with bone biopsy 08/06/20. -she began Xgeva and fulvestrant on 08/15/20 and palbociclib on 09/12/20. She tolerates well overall. -[employer] testing obtained on her 07/2020 biopsy showed no targetable mutation.  -PET scan on 12/22/2022 showed treated diffuse bone mets, no hypermetabolic uptake  -Due to high co-pay, Ilda Foil was switched to [facility] in February 2024, she is tolerating well with low-dose 100 mg twice daily, with occasional diarrhea. -continue Xgeva every 3 months

## 2023-09-14 ENCOUNTER — Ambulatory Visit: Payer: BC Managed Care – PPO | Admitting: Plastic Surgery

## 2023-09-14 ENCOUNTER — Other Ambulatory Visit: Payer: Self-pay

## 2023-09-14 ENCOUNTER — Encounter: Payer: Self-pay | Admitting: Plastic Surgery

## 2023-09-14 VITALS — BP 164/80 | HR 97 | Ht 64.0 in | Wt 167.2 lb

## 2023-09-14 DIAGNOSIS — Z9011 Acquired absence of right breast and nipple: Secondary | ICD-10-CM | POA: Diagnosis not present

## 2023-09-14 DIAGNOSIS — Z9889 Other specified postprocedural states: Secondary | ICD-10-CM

## 2023-09-14 DIAGNOSIS — C50512 Malignant neoplasm of lower-outer quadrant of left female breast: Secondary | ICD-10-CM

## 2023-09-14 DIAGNOSIS — Z170 Estrogen receptor positive status [ER+]: Secondary | ICD-10-CM

## 2023-09-14 DIAGNOSIS — C7951 Secondary malignant neoplasm of bone: Secondary | ICD-10-CM

## 2023-09-14 NOTE — Progress Notes (Signed)
 Patient ID: Natasha Graham, female    DOB: 1961-05-14, 63 y.o.   MRN: [id number]   Chief Complaint  Patient presents with   Follow-up   Breast Problem    The patient is a 63 year old female here for a yearly follow-up after treatment for her breast cancer.  She had abnormal mammograms and was diagnosed with breast cancer where biopsies were positive and the tumor was estrogen and progesterone positive with DCIS.  She also had a strong family history of breast cancer.  In 2015 she had a right immediate breast reconstruction after mastectomy.  In December 2015 she had an implant placed that was a smooth round high-profile saline 500 cc implant filled to 600 cc.  She underwent a left mastopexy for symmetry.  And 2021 the patient had some concerns for irregularities in her left mammogram so she underwent a left mastectomy with immediate reconstruction.  In July 2022 she had removal of the expander and implant placement.  On the left side she has a 350 cc implant on the right she has a 450 cc implant.  In 2022 she did have a CT and a bone scan which was negative for breast disease.  She does have a spinal rod in place for scoliosis and complains of intermittent muscle spasming particularly in the left breast.  The patient states she was painting the other day and felt some areas pop on the left breast with a little bit of a release.  She has not been doing her exercise as mentioned in her last visit but she is willing to start and would like to hold off on doing any PT right now.     Review of Systems  Constitutional: Negative.   HENT: Negative.    Eyes: Negative.   Respiratory: Negative.    Cardiovascular: Negative.   Gastrointestinal: Negative.   Endocrine: Negative.   Genitourinary: Negative.   Musculoskeletal: Negative.     Past Medical History:  Diagnosis Date   Cancer [facility])    breast cancer left 2021 roght 2015   Family history of brain cancer    Family history of breast cancer     Family history of colon cancer    Family history of stomach cancer    History of radiation therapy last done February 06 2020   Hypertension    PMB (postmenopausal bleeding)    PONV (postoperative nausea and vomiting)    likes scopolamine  patch   Scoliosis    Wears glasses    Wears glasses     Past Surgical History:  Procedure Laterality Date   AUGMENTATION MAMMAPLASTY Right    2015 post mastectomy   AXILLARY LYMPH NODE DISSECTION Left 11/13/2019   Procedure: LEFT AXILLARY LYMPH NODE DISSECTION;  Surgeon: Ebbie Cough, MD;  Location: [facility];  Service: General;  Laterality: Left;   BREAST IMPLANT EXCHANGE Right 03/19/2021   Procedure: exchange of right saline implant;  Surgeon: Lowery Estefana RAMAN, DO;  Location: [facility];  Service: Plastics;  Laterality: Right;   BREAST RECONSTRUCTION WITH PLACEMENT OF TISSUE EXPANDER AND FLEX HD (ACELLULAR HYDRATED DERMIS) Right 05/16/2014   Procedure: IMMEDIATE RIGHT BREAST RECONSTRUCTION WITH PLACEMENT OF TISSUE EXPANDER AND FLEX HD (ACELLULAR HYDRATED DERMIS);  Surgeon: Estefana Reichert, DO;  Location: [facility] OR;  Service: Plastics;  Laterality: Right;   BREAST RECONSTRUCTION WITH PLACEMENT OF TISSUE EXPANDER AND FLEX HD (ACELLULAR HYDRATED DERMIS) Left 09/27/2019   Procedure: LEFT BREAST RECONSTRUCTION WITH PLACEMENT OF TISSUE  EXPANDER AND FLEX HD (ACELLULAR HYDRATED DERMIS);  Surgeon: Lowery Estefana RAMAN, DO;  Location: [facility];  Service: Plastics;  Laterality: Left;   BREAST SURGERY     right breast excisional biopsy   DILATATION & CURETTAGE/HYSTEROSCOPY WITH MYOSURE N/A 12/20/2020   Procedure: DILATATION & CURETTAGE/HYSTEROSCOPY WITH  MYOSURE;  Surgeon: Gorge Ade, MD;  Location: [facility];  Service: Gynecology;  Laterality: N/A;   DILATION AND CURETTAGE OF UTERUS     HYSTEROSCOPY WITH D & C N/A 09/20/2015   Procedure: DILATATION AND CURETTAGE /HYSTEROSCOPY;  Surgeon: Ade Gorge, MD;  Location: [facility];  Service: Gynecology;  Laterality: N/A;   INCISIONAL HERNIA REPAIR N/A 04/27/2023   Procedure: LAPAROSCOPIC ASSISTED INCISIONAL HERNIA WITH MESH;  Surgeon: Ebbie Cough, MD;  Location: [facility] OR;  Service: General;  Laterality: N/A;  GEN/TAP BLOCK   LIPOSUCTION Bilateral 08/08/2014   Procedure: LIPO SUCTION ;  Surgeon: Estefana Reichert, DO;  Location: [facility];  Service: Plastics;  Laterality: Bilateral;   MASTECTOMY Right 05/16/2014   placement of acellular dermal matrix & tissue expanders    MASTOPEXY Left 08/08/2014   Procedure:  MASTOPEXY FOR SYMMETRY;  Surgeon: Estefana Reichert, DO;  Location: [facility];  Service: Plastics;  Laterality: Left;   NIPPLE SPARING MASTECTOMY WITH SENTINEL LYMPH NODE BIOPSY Left 09/27/2019   Procedure: LEFT NIPPLE SPARING MASTECTOMY WITH LEFT AXILLARY SENTINEL LYMPH NODE BIOPSY;  Surgeon: Ebbie Cough, MD;  Location: [facility];  Service: General;  Laterality: Left;   REDUCTION MAMMAPLASTY Left    2015   REMOVAL OF TISSUE EXPANDER AND PLACEMENT OF IMPLANT Right 08/08/2014   Procedure: REMOVAL OF RIGHT  TISSUE EXPANDERS WITH PLACEMENT OF RIGHT BREAST IMPLANTS WITH LIPO SUCTION ;  Surgeon: Estefana Reichert, DO;  Location: [facility];  Service: Plastics;  Laterality: Right;   REMOVAL OF TISSUE EXPANDER AND PLACEMENT OF IMPLANT Left 03/19/2021   Procedure: Removal of left expander for saline implant;  Surgeon: Lowery Estefana RAMAN, DO;  Location: [facility];  Service: Plastics;  Laterality: Left;  2 hours   ROBOTIC ASSISTED TOTAL HYSTERECTOMY WITH BILATERAL SALPINGO OOPHERECTOMY Bilateral 02/06/2021   Procedure: XI ROBOTIC ASSISTED TOTAL HYSTERECTOMY WITH BILATERAL SALPINGO OOPHORECTOMY, LYSIS of SIGMOID ADHESIONS MCCALL CUL DE PLASTY;  Surgeon: Gorge Ade, MD;  Location: [facility];  Service: Gynecology;  Laterality: Bilateral;   SCAR REVISION Left  11/13/2019   Procedure: EXCISION OF LEFT MASTECTOMY SKIN;  Surgeon: Ebbie Cough, MD;  Location: [facility];  Service: General;  Laterality: Left;   scoliosis  1972   harrington rods-age 64   TONSILLECTOMY  as child      Current Outpatient Medications:    abemaciclib  (VERZENIO ) 100 MG tablet, Take 1 tablet (100 mg total) by mouth 2 (two) times daily., Disp: 56 tablet, Rfl: 2   ALOE VERA PO, Take 1 capsule by mouth daily., Disp: , Rfl:    anastrozole  (ARIMIDEX ) 1 MG tablet, Take 1 tablet (1 mg total) by mouth daily., Disp: 90 tablet, Rfl: 1   Ascorbic Acid (VITAMIN C) 1000 MG tablet, Take 1,000 mg by mouth daily., Disp: , Rfl:    Calcium Carb-Cholecalciferol (CALCIUM PLUS VITAMIN D3) 600-20 MG-MCG TABS, Take 1 tablet by mouth 2 (two) times daily., Disp: , Rfl:    cholecalciferol (VITAMIN D3) 25 MCG (1000 UNIT) tablet, Take 1,000 Units by mouth daily., Disp: , Rfl:    Coenzyme Q10 (COQ10) 200 MG CAPS,  Take 200 mg by mouth daily., Disp: , Rfl:    denosumab  (XGEVA ) 120 MG/1.7ML SOLN injection, Inject 120 mg into the skin every 3 (three) months., Disp: , Rfl:    Flaxseed Oil (LINSEED OIL) OIL, Take 1,200 mg by mouth daily., Disp: , Rfl:    hydrochlorothiazide  (HYDRODIURIL ) 25 MG tablet, Take 25 mg by mouth daily., Disp: , Rfl:    ibuprofen  (ADVIL ) 200 MG tablet, Take 400 mg by mouth every 8 (eight) hours as needed for moderate pain., Disp: , Rfl:    lisinopril  (ZESTRIL ) 30 MG tablet, Take 30 mg by mouth daily., Disp: , Rfl:    Magnesium 400 MG TABS, Take 400 mg by mouth every evening., Disp: , Rfl:    Multiple Vitamin (MULTIVITAMIN) capsule, Take 1 capsule by mouth daily., Disp: , Rfl:    Potassium 99 MG TABS, Take 99 mg by mouth in the morning and at bedtime., Disp: , Rfl:    Probiotic Product (PROBIOTIC DAILY PO), Take 1 capsule by mouth 2 (two) times a week., Disp: , Rfl:    Turmeric 500 MG CAPS, Take 500 mg by mouth 2 (two) times daily., Disp: , Rfl:    doxycycline   (VIBRA -TABS) 100 MG tablet, Take 1 tablet (100 mg total) by mouth 2 (two) times daily., Disp: 14 tablet, Rfl: 0   Objective:   Vitals:   09/14/23 0825  BP: (!) 164/80  Pulse: 97  SpO2: 96%    Physical Exam Vitals reviewed.  Constitutional:      Appearance: Normal appearance.  HENT:     Head: Atraumatic.  Cardiovascular:     Rate and Rhythm: Normal rate.     Pulses: Normal pulses.  Pulmonary:     Effort: Pulmonary effort is normal.  Abdominal:     Palpations: Abdomen is soft.  Musculoskeletal:        General: No swelling or deformity.  Skin:    General: Skin is warm.     Capillary Refill: Capillary refill takes less than 2 seconds.     Coloration: Skin is not jaundiced.     Findings: No bruising.  Neurological:     Mental Status: She is alert and oriented to person, place, and time.  Psychiatric:        Mood and Affect: Mood normal.        Behavior: Behavior normal.        Judgment: Judgment normal.     Assessment & Plan:  Malignant neoplasm of lower-outer quadrant of left breast of female, estrogen receptor positive (HCC)  Status post right breast reconstruction  Acquired absence of right breast  We discussed the options for scar release with fat grafting on the left breast.  The right breast looks fine.  The patient is good to wait for now but she knows she can call us  at any time.  Will plan to see her back in 1 year.  Estefana RAMAN Dhanush Jokerst, DO

## 2023-09-15 ENCOUNTER — Inpatient Hospital Stay: Payer: BC Managed Care – PPO

## 2023-09-15 ENCOUNTER — Encounter: Payer: Self-pay | Admitting: Hematology

## 2023-09-15 ENCOUNTER — Inpatient Hospital Stay: Payer: BC Managed Care – PPO | Attending: Hematology

## 2023-09-15 ENCOUNTER — Other Ambulatory Visit (HOSPITAL_COMMUNITY): Payer: Self-pay

## 2023-09-15 ENCOUNTER — Inpatient Hospital Stay (HOSPITAL_BASED_OUTPATIENT_CLINIC_OR_DEPARTMENT_OTHER): Payer: BC Managed Care – PPO | Admitting: Hematology

## 2023-09-15 VITALS — BP 147/78 | HR 89 | Temp 98.7°F | Resp 16

## 2023-09-15 VITALS — BP 142/71 | HR 103 | Temp 97.3°F | Resp 15 | Wt 168.0 lb

## 2023-09-15 DIAGNOSIS — Z79899 Other long term (current) drug therapy: Secondary | ICD-10-CM | POA: Insufficient documentation

## 2023-09-15 DIAGNOSIS — Z79811 Long term (current) use of aromatase inhibitors: Secondary | ICD-10-CM | POA: Diagnosis not present

## 2023-09-15 DIAGNOSIS — C7951 Secondary malignant neoplasm of bone: Secondary | ICD-10-CM

## 2023-09-15 DIAGNOSIS — Z170 Estrogen receptor positive status [ER+]: Secondary | ICD-10-CM | POA: Insufficient documentation

## 2023-09-15 DIAGNOSIS — C50512 Malignant neoplasm of lower-outer quadrant of left female breast: Secondary | ICD-10-CM | POA: Diagnosis not present

## 2023-09-15 LAB — CBC WITH DIFFERENTIAL (CANCER CENTER ONLY)
Abs Immature Granulocytes: 0.01 10*3/uL (ref 0.00–0.07)
Basophils Absolute: 0 10*3/uL (ref 0.0–0.1)
Basophils Relative: 1 %
Eosinophils Absolute: 0.1 10*3/uL (ref 0.0–0.5)
Eosinophils Relative: 1 %
HCT: 35.8 % — ABNORMAL LOW (ref 36.0–46.0)
Hemoglobin: 12.7 g/dL (ref 12.0–15.0)
Immature Granulocytes: 0 %
Lymphocytes Relative: 28 %
Lymphs Abs: 1.1 10*3/uL (ref 0.7–4.0)
MCH: 35.9 pg — ABNORMAL HIGH (ref 26.0–34.0)
MCHC: 35.5 g/dL (ref 30.0–36.0)
MCV: 101.1 fL — ABNORMAL HIGH (ref 80.0–100.0)
Monocytes Absolute: 0.3 10*3/uL (ref 0.1–1.0)
Monocytes Relative: 7 %
Neutro Abs: 2.4 10*3/uL (ref 1.7–7.7)
Neutrophils Relative %: 63 %
Platelet Count: 282 10*3/uL (ref 150–400)
RBC: 3.54 MIL/uL — ABNORMAL LOW (ref 3.87–5.11)
RDW: 12.7 % (ref 11.5–15.5)
WBC Count: 3.8 10*3/uL — ABNORMAL LOW (ref 4.0–10.5)
nRBC: 0 % (ref 0.0–0.2)

## 2023-09-15 LAB — CMP (CANCER CENTER ONLY)
ALT: 20 U/L (ref 0–44)
AST: 20 U/L (ref 15–41)
Albumin: 4.3 g/dL (ref 3.5–5.0)
Alkaline Phosphatase: 43 U/L (ref 38–126)
Anion gap: 8 (ref 5–15)
BUN: 13 mg/dL (ref 8–23)
CO2: 30 mmol/L (ref 22–32)
Calcium: 9.5 mg/dL (ref 8.9–10.3)
Chloride: 101 mmol/L (ref 98–111)
Creatinine: 1.05 mg/dL — ABNORMAL HIGH (ref 0.44–1.00)
GFR, Estimated: 60 mL/min — ABNORMAL LOW (ref >60)
Glucose, Bld: 98 mg/dL (ref 70–99)
Potassium: 3.5 mmol/L (ref 3.5–5.1)
Sodium: 139 mmol/L (ref 135–145)
Total Bilirubin: 0.6 mg/dL (ref 0.0–1.2)
Total Protein: 7.2 g/dL (ref 6.5–8.1)

## 2023-09-15 MED ORDER — DENOSUMAB 120 MG/1.7ML ~~LOC~~ SOLN
120.0000 mg | Freq: Once | SUBCUTANEOUS | Status: AC
Start: 2023-09-15 — End: 2023-09-15
  Administered 2023-09-15: 120 mg via SUBCUTANEOUS

## 2023-09-15 NOTE — Progress Notes (Signed)
 [facility]   [phone] [phone]   Clinic Follow up Note   Patient Care Team: Hamrick, Orest Bio, MD as PCP - General (Family Medicine) Enid Harry, MD as Consulting Physician (General Surgery) Auther Bo, RN as Registered Nurse Alane Hsu, RN as Registered Nurse Dillingham, Lindaann Requena, DO as Attending Physician (Plastic Surgery) Retta Caster, MD as Consulting Physician (Radiation Oncology) Meriam Stamp, MD as Consulting Physician (Obstetrics and Gynecology) Althea Atkinson, RPH-CPP (Pharmacist) Sonja Mountain, MD as Consulting Physician (Medical Oncology)  Date of Service:  09/15/2023  CHIEF COMPLAINT: f/u of metastatic breast cancer  CURRENT THERAPY:  Anastrozole , Verzenio  and Xgeva   Oncology History   Malignant neoplasm metastatic to bone [facility]) Metastatic Breast Cancer to bones, lobular carcinoma, ER+/PR+, HER2 IHC 2+ (low expression) -left breast cancer initially diagnosed 07/12/19. S/p left mastectomy on 09/27/19 showed stage IIIA p(T3, N1) ILC, SLND on 11/13/19 was negative (0/7). Mammaprint was low risk. -she received postmastectomy radiation 12/26/19 - 02/06/20 and took tamoxifen  02/29/20 - 07/2020 -genetics 06/27/20 were negative. -she was found to have bony metastatic disease in 07/2020, confirmed with bone biopsy 08/06/20. -she began Xgeva  and fulvestrant  on 08/15/20 and palbociclib  on 09/12/20. She tolerates well overall. -[employer] testing obtained on her 07/2020 biopsy showed no targetable mutation.  -PET scan on 12/22/2022 showed treated diffuse bone mets, no hypermetabolic uptake  -Due to high co-pay, Ibrance  was switched to Verzenio  in February 2024, she is tolerating well with low-dose 100 mg twice daily, with occasional diarrhea. -continue Xgeva  every 3 months    Assessment and Plan    Metastatic Breast Cancer Follow-up for metastatic breast cancer. Currently on anastrozole  (Arimidex ) and Xgeva . Joint pain  manageable. No leg swelling. Blood counts normal except for slightly low white count, likely medication-related. Pending kidney and liver function tests previously normal. Discussed avoiding invasive dental procedures one month before and after Xgeva  injections due to bone healing impact. Last PET scan in October; next scan in April. Consents to continuing current treatment. - Continue anastrozole  - Administer Xgeva  injection today - Order PET scan in April - Schedule follow-up appointment in mid-April - Perform lab tests one week before the next appointment - Advise to inform if any invasive dental procedures are planned  General Health Maintenance Routine health maintenance discussed. No current dental issues or procedures planned. Routine dental cleaning acceptable. - Advise to avoid invasive dental procedures one month before and after Xgeva  injection  Plan -Patient is clinically doing well, lab reviewed, will continue current therapy -Will proceed Xgeva  injection today and continue every 3 months - Schedule follow-up appointment in mid-April - Perform lab tests and PET scan one week before the next appointment.         SUMMARY OF ONCOLOGIC HISTORY: Oncology History Overview Note   Cancer Staging  Malignant neoplasm metastatic to bone [facility]) Staging form: Bone - Appendicular Skeleton, Trunk, Skull, and Facial Bones, AJCC 8th Edition - Clinical: Stage IVB (cT3, cN1, cM1b) - Signed by Willy Harvest, MD on 08/15/2020  Malignant neoplasm of lower-outer quadrant of left breast of female, estrogen receptor positive (HCC) Staging form: Breast, AJCC 8th Edition - Clinical: Stage IIIA (cT3, cN1, cM0, G2, ER+, PR-, HER2-) - Signed by Willy Harvest, MD on 08/15/2020     Breast neoplasm, Tis (DCIS), right (Resolved)  05/16/2014 Initial Diagnosis   Breast neoplasm, Tis (DCIS), right   06/27/2020 Genetic Testing   Negative genetic testing on the CancerNext-Expanded+RNAinsight  panel testing.  The  CancerNext-Expanded gene panel offered by [employer] and includes sequencing and rearrangement analysis for the following 77 genes: AIP, ALK, APC*, ATM*, AXIN2, BAP1, BARD1, BLM, BMPR1A, BRCA1*, BRCA2*, BRIP1*, CDC73, CDH1*, CDK4, CDKN1B, CDKN2A, CHEK2*, CTNNA1, DICER1, FANCC, FH, FLCN, GALNT12, KIF1B, LZTR1, MAX, MEN1, MET, MLH1*, MSH2*, MSH3, MSH6*, MUTYH*, NBN, NF1*, NF2, NTHL1, PALB2*, PHOX2B, PMS2*, POT1, PRKAR1A, PTCH1, PTEN*, RAD51C*, RAD51D*, RB1, RECQL, RET, SDHA, SDHAF2, SDHB, SDHC, SDHD, SMAD4, SMARCA4, SMARCB1, SMARCE1, STK11, SUFU, TMEM127, TP53*, TSC1, TSC2, VHL and XRCC2 (sequencing and deletion/duplication); EGFR, EGLN1, HOXB13, KIT, MITF, PDGFRA, POLD1, and POLE (sequencing only); EPCAM and GREM1 (deletion/duplication only). DNA and RNA analyses performed for * genes. The report date is 06/27/2020.   Malignant neoplasm of lower-outer quadrant of left breast of female, estrogen receptor positive (HCC)  10/18/2019 Initial Diagnosis   Malignant neoplasm of lower-outer quadrant of left breast of female, estrogen receptor positive (HCC)   08/15/2020 Cancer Staging   Staging form: Breast, AJCC 8th Edition - Clinical: Stage IIIA (cT3, cN1, cM0, G2, ER+, PR-, HER2-) - Signed by Willy Harvest, MD on 08/15/2020   Malignant neoplasm metastatic to bone (HCC)  08/05/2020 Initial Diagnosis   Bone metastases (HCC)   08/15/2020 Cancer Staging   Staging form: Bone - Appendicular Skeleton, Trunk, Skull, and Facial Bones, AJCC 8th Edition - Clinical: Stage IVB (cT3, cN1, cM1b) - Signed by Willy Harvest, MD on 08/15/2020   12/21/2022 PET scan    IMPRESSION: Status post bilateral breast reconstruction with left axillary lymph node dissection.   Multifocal/diffuse treated osseous metastases, as above.   Otherwise, no evidence of recurrent or metastatic disease.        Discussed the use of AI scribe software for clinical note transcription with the patient,  who gave verbal consent to proceed.  History of Present Illness   Natasha Graham, a 63 year old female with a history of breast cancer, presents for a routine follow-up. She reports no new symptoms or concerns since her last visit. She is currently on Anastrozole  (Arimidex ), Rosin, and Xgeva  for her cancer treatment. She had a hernia repair in August and took a break from Rosin around that time. She reports occasional diarrhea and cramps from Rosin, but these symptoms have leveled out. She denies any dental issues or pain. She reports joint pain, but it is not severe. She has no swelling in her legs. Her energy and appetite are good. She had been caring for her granddaughters daily, but her daughter has since become a stay-at-home mom, which has improved her energy levels.         All other systems were reviewed with the patient and are negative.  MEDICAL HISTORY:  Past Medical History:  Diagnosis Date   Cancer [facility])    breast cancer left 2021 roght 2015   Family history of brain cancer    Family history of breast cancer    Family history of colon cancer    Family history of stomach cancer    History of radiation therapy last done February 06 2020   Hypertension    PMB (postmenopausal bleeding)    PONV (postoperative nausea and vomiting)    likes scopolamine  patch   Scoliosis    Wears glasses    Wears glasses     SURGICAL HISTORY: Past Surgical History:  Procedure Laterality Date   AUGMENTATION MAMMAPLASTY Right    2015 post mastectomy   AXILLARY LYMPH NODE DISSECTION Left 11/13/2019   Procedure: LEFT AXILLARY LYMPH NODE DISSECTION;  Surgeon: Delane Fear,  Zoila Hines, MD;  Location: [facility];  Service: General;  Laterality: Left;   BREAST IMPLANT EXCHANGE Right 03/19/2021   Procedure: exchange of right saline implant;  Surgeon: Thornell Flirt, DO;  Location: [facility];  Service: Plastics;  Laterality: Right;   BREAST RECONSTRUCTION WITH PLACEMENT OF  TISSUE EXPANDER AND FLEX HD (ACELLULAR HYDRATED DERMIS) Right 05/16/2014   Procedure: IMMEDIATE RIGHT BREAST RECONSTRUCTION WITH PLACEMENT OF TISSUE EXPANDER AND FLEX HD (ACELLULAR HYDRATED DERMIS);  Surgeon: Marilou Showman, DO;  [facility];  Service: Plastics;  Laterality: Right;   BREAST RECONSTRUCTION WITH PLACEMENT OF TISSUE EXPANDER AND FLEX HD (ACELLULAR HYDRATED DERMIS) Left 09/27/2019   Procedure: LEFT BREAST RECONSTRUCTION WITH PLACEMENT OF TISSUE EXPANDER AND FLEX HD (ACELLULAR HYDRATED DERMIS);  Surgeon: Thornell Flirt, DO;  Location: [facility];  Service: Plastics;  Laterality: Left;   BREAST SURGERY     right breast excisional biopsy   DILATATION & CURETTAGE/HYSTEROSCOPY WITH MYOSURE N/A 12/20/2020   Procedure: DILATATION & CURETTAGE/HYSTEROSCOPY WITH  MYOSURE;  Surgeon: Meriam Stamp, MD;  Location: [facility] ;  Service: Gynecology;  Laterality: N/A;   DILATION AND CURETTAGE OF UTERUS     HYSTEROSCOPY WITH D & C N/A 09/20/2015   Procedure: DILATATION AND CURETTAGE /HYSTEROSCOPY;  Surgeon: Meriam Stamp, MD;  Location: [facility];  Service: Gynecology;  Laterality: N/A;   INCISIONAL HERNIA REPAIR N/A 04/27/2023   Procedure: LAPAROSCOPIC ASSISTED INCISIONAL HERNIA WITH MESH;  Surgeon: Enid Harry, MD;  Location: [facility];  Service: General;  Laterality: N/A;  GEN/TAP BLOCK   LIPOSUCTION Bilateral 08/08/2014   Procedure: LIPO SUCTION ;  Surgeon: Marilou Showman, DO;  Location: [facility];  Service: Plastics;  Laterality: Bilateral;   MASTECTOMY Right 05/16/2014   placement of acellular dermal matrix & tissue expanders    MASTOPEXY Left 08/08/2014   Procedure:  MASTOPEXY FOR SYMMETRY;  Surgeon: Marilou Showman, DO;  Location: [facility];  Service: Plastics;  Laterality: Left;   NIPPLE SPARING MASTECTOMY WITH SENTINEL LYMPH NODE BIOPSY Left 09/27/2019   Procedure: LEFT NIPPLE SPARING MASTECTOMY WITH LEFT AXILLARY SENTINEL  LYMPH NODE BIOPSY;  Surgeon: Enid Harry, MD;  Location: [facility];  Service: General;  Laterality: Left;   REDUCTION MAMMAPLASTY Left    2015   REMOVAL OF TISSUE EXPANDER AND PLACEMENT OF IMPLANT Right 08/08/2014   Procedure: REMOVAL OF RIGHT  TISSUE EXPANDERS WITH PLACEMENT OF RIGHT BREAST IMPLANTS WITH LIPO SUCTION ;  Surgeon: Marilou Showman, DO;  Location: [facility];  Service: Plastics;  Laterality: Right;   REMOVAL OF TISSUE EXPANDER AND PLACEMENT OF IMPLANT Left 03/19/2021   Procedure: Removal of left expander for saline implant;  Surgeon: Thornell Flirt, DO;  Location: [facility];  Service: Plastics;  Laterality: Left;  2 hours   ROBOTIC ASSISTED TOTAL HYSTERECTOMY WITH BILATERAL SALPINGO OOPHERECTOMY Bilateral 02/06/2021   Procedure: XI ROBOTIC ASSISTED TOTAL HYSTERECTOMY WITH BILATERAL SALPINGO OOPHORECTOMY, LYSIS of SIGMOID ADHESIONS MCCALL CUL DE PLASTY;  Surgeon: Meriam Stamp, MD;  Location: [facility];  Service: Gynecology;  Laterality: Bilateral;   SCAR REVISION Left 11/13/2019   Procedure: EXCISION OF LEFT MASTECTOMY SKIN;  Surgeon: Enid Harry, MD;  Location: [facility];  Service: General;  Laterality: Left;   scoliosis  1972   harrington rods-age 27   TONSILLECTOMY  as child    I have reviewed the social history and family history with the patient and  they are unchanged from previous note.  ALLERGIES:  has no known allergies.  MEDICATIONS:  Current Outpatient Medications  Medication Sig Dispense Refill   abemaciclib  (VERZENIO ) 100 MG tablet Take 1 tablet (100 mg total) by mouth 2 (two) times daily. 56 tablet 2   ALOE VERA PO Take 1 capsule by mouth daily.     anastrozole  (ARIMIDEX ) 1 MG tablet Take 1 tablet (1 mg total) by mouth daily. 90 tablet 1   Ascorbic Acid (VITAMIN C) 1000 MG tablet Take 1,000 mg by mouth daily.     Calcium Carb-Cholecalciferol (CALCIUM PLUS VITAMIN D3)  600-20 MG-MCG TABS Take 1 tablet by mouth 2 (two) times daily.     cholecalciferol (VITAMIN D3) 25 MCG (1000 UNIT) tablet Take 1,000 Units by mouth daily.     Coenzyme Q10 (COQ10) 200 MG CAPS Take 200 mg by mouth daily.     denosumab  (XGEVA ) 120 MG/1.7ML SOLN injection Inject 120 mg into the skin every 3 (three) months.     Flaxseed Oil (LINSEED OIL) OIL Take 1,200 mg by mouth daily.     hydrochlorothiazide  (HYDRODIURIL ) 25 MG tablet Take 25 mg by mouth daily.     ibuprofen  (ADVIL ) 200 MG tablet Take 400 mg by mouth every 8 (eight) hours as needed for moderate pain.     lisinopril  (ZESTRIL ) 30 MG tablet Take 30 mg by mouth daily.     Magnesium 400 MG TABS Take 400 mg by mouth every evening.     Multiple Vitamin (MULTIVITAMIN) capsule Take 1 capsule by mouth daily.     Potassium 99 MG TABS Take 99 mg by mouth in the morning and at bedtime.     Probiotic Product (PROBIOTIC DAILY PO) Take 1 capsule by mouth 2 (two) times a week.     Turmeric 500 MG CAPS Take 500 mg by mouth 2 (two) times daily.     No current facility-administered medications for this visit.    PHYSICAL EXAMINATION: ECOG PERFORMANCE STATUS: 0 - Asymptomatic  Vitals:   09/15/23 0831  BP: (!) 142/71  Pulse: (!) 103  Resp: 15  Temp: (!) 97.3 F (36.3 C)  SpO2: 100%   Wt Readings from Last 3 Encounters:  09/15/23 168 lb (76.2 kg)  09/14/23 167 lb 3.2 oz (75.8 kg)  06/16/23 166 lb 6.4 oz (75.5 kg)     GENERAL:alert, no distress and comfortable SKIN: skin color, texture, turgor are normal, no rashes or significant lesions EYES: normal, Conjunctiva are pink and non-injected, sclera clear NECK: supple, thyroid  normal size, non-tender, without nodularity LYMPH:  no palpable lymphadenopathy in the cervical, axillary  LUNGS: clear to auscultation and percussion with normal breathing effort HEART: regular rate & rhythm and no murmurs and no lower extremity edema ABDOMEN:abdomen soft, non-tender and normal bowel  sounds Musculoskeletal:no cyanosis of digits and no clubbing  NEURO: alert & oriented x 3 with fluent speech, no focal motor/sensory deficits    LABORATORY DATA:  I have reviewed the data as listed    Latest Ref Rng & Units 09/15/2023    8:17 AM 06/16/2023    8:09 AM 05/13/2023    8:04 AM  CBC  WBC 4.0 - 10.5 K/uL 3.8  2.5  6.1   Hemoglobin 12.0 - 15.0 g/dL 14.7  82.9  56.2   Hematocrit 36.0 - 46.0 % 35.8  34.9  31.5   Platelets 150 - 400 K/uL 282  217  346         Latest Ref Rng &  Units 09/15/2023    8:17 AM 06/16/2023    8:09 AM 05/13/2023    8:04 AM  CMP  Glucose 70 - 99 mg/dL 98  161  096   BUN 8 - 23 mg/dL 13  11  10    Creatinine 0.44 - 1.00 mg/dL 0.45  4.09  8.11   Sodium 135 - 145 mmol/L 139  140  139   Potassium 3.5 - 5.1 mmol/L 3.5  3.6  3.7   Chloride 98 - 111 mmol/L 101  99  101   CO2 22 - 32 mmol/L 30  28  30    Calcium 8.9 - 10.3 mg/dL 9.5  9.7  9.5   Total Protein 6.5 - 8.1 g/dL 7.2  7.0  7.0   Total Bilirubin 0.0 - 1.2 mg/dL 0.6  0.7  0.4   Alkaline Phos 38 - 126 U/L 43  46  82   AST 15 - 41 U/L 20  22  20    ALT 0 - 44 U/L 20  26  34       RADIOGRAPHIC STUDIES: I have personally reviewed the radiological images as listed and agreed with the findings in the report. No results found.    Orders Placed This Encounter  Procedures   NM PET Image Restage (PS) Skull Base to Thigh (F-18 FDG)    Standing Status:   Future    Expected Date:   12/07/2023    Expiration Date:   09/14/2024    If indicated for the ordered procedure, I authorize the administration of a radiopharmaceutical per Radiology protocol:   Yes    Preferred imaging location?:   [facility]    Radiology Contrast Protocol - do NOT remove file path:   [email or web address]   All questions were answered. The patient knows to call the clinic with any problems, questions or concerns. No barriers to learning was detected. The total time spent in the  appointment was 25 minutes.     Sonja [place], MD 09/15/2023

## 2023-09-16 LAB — CANCER ANTIGEN 27.29: CA 27.29: 27.7 U/mL (ref 0.0–38.6)

## 2023-09-23 ENCOUNTER — Telehealth: Payer: Self-pay | Admitting: Pharmacy Technician

## 2023-09-23 NOTE — Telephone Encounter (Signed)
Oral Oncology Patient Advocate Encounter  Prior Authorization for [name] has been approved.    PA# [id number] Effective dates: 09/23/23 through 09/22/24  Patient may continue to fill at [facility].    Jinger Neighbors, CPhT-Adv Oncology Pharmacy Patient Advocate [facility] Direct Number: [phone]  Fax: [phone]

## 2023-09-23 NOTE — Telephone Encounter (Signed)
Oral Oncology Patient Advocate Encounter   Received notification that prior authorization for Verzenio is due for renewal.   PA submitted on 09/23/23 Key [id number] Status is pending     Jinger Neighbors, CPhT-Adv Oncology Pharmacy Patient Advocate [facility] Direct Number: [phone]  Fax: [phone]

## 2023-10-05 ENCOUNTER — Other Ambulatory Visit: Payer: Self-pay

## 2023-10-05 ENCOUNTER — Other Ambulatory Visit: Payer: Self-pay | Admitting: Hematology

## 2023-10-05 ENCOUNTER — Other Ambulatory Visit (HOSPITAL_COMMUNITY): Payer: Self-pay

## 2023-10-05 MED ORDER — ABEMACICLIB 100 MG PO TABS
100.0000 mg | ORAL_TABLET | Freq: Two times a day (BID) | ORAL | 2 refills | Status: DC
  Filled 2023-10-05: qty 56, 28d supply, fill #0
  Filled 2023-11-04: qty 56, 28d supply, fill #1
  Filled 2023-12-08: qty 56, 28d supply, fill #2

## 2023-10-05 NOTE — Progress Notes (Signed)
Specialty Pharmacy Refill Coordination Note  Natasha Graham is a 63 y.o. female contacted today regarding refills of specialty medication(s) Abemaciclib [name])   Patient requested [name] at [facility] at [place] date: 10/12/23   Medication will be filled on 10/11/23 pending a refill request.

## 2023-10-08 ENCOUNTER — Other Ambulatory Visit: Payer: Self-pay

## 2023-10-11 ENCOUNTER — Other Ambulatory Visit: Payer: Self-pay

## 2023-11-03 ENCOUNTER — Other Ambulatory Visit (HOSPITAL_COMMUNITY): Payer: Self-pay

## 2023-11-04 ENCOUNTER — Other Ambulatory Visit: Payer: Self-pay | Admitting: Pharmacy Technician

## 2023-11-04 ENCOUNTER — Other Ambulatory Visit: Payer: Self-pay

## 2023-11-04 NOTE — Progress Notes (Signed)
 Specialty Pharmacy Refill Coordination Note  Natasha Graham is a 63 y.o. female contacted today regarding refills of specialty medication(s) Abemaciclib [name])   Patient requested [name] at [facility] at [place] date: 11/19/23   Medication will be filled on 11/18/23.

## 2023-11-18 ENCOUNTER — Other Ambulatory Visit: Payer: Self-pay

## 2023-11-19 ENCOUNTER — Other Ambulatory Visit (HOSPITAL_COMMUNITY): Payer: Self-pay

## 2023-12-03 ENCOUNTER — Ambulatory Visit (HOSPITAL_COMMUNITY)
Admission: RE | Admit: 2023-12-03 | Discharge: 2023-12-03 | Disposition: A | Discharge status: Home / Self Care | Source: Ambulatory Visit | Attending: Hematology | Admitting: Hematology

## 2023-12-03 DIAGNOSIS — C7951 Secondary malignant neoplasm of bone: Secondary | ICD-10-CM | POA: Diagnosis not present

## 2023-12-03 DIAGNOSIS — K5730 Diverticulosis of large intestine without perforation or abscess without bleeding: Secondary | ICD-10-CM | POA: Diagnosis not present

## 2023-12-03 DIAGNOSIS — K760 Fatty (change of) liver, not elsewhere classified: Secondary | ICD-10-CM | POA: Diagnosis not present

## 2023-12-03 DIAGNOSIS — C50919 Malignant neoplasm of unspecified site of unspecified female breast: Secondary | ICD-10-CM | POA: Diagnosis not present

## 2023-12-03 DIAGNOSIS — M47812 Spondylosis without myelopathy or radiculopathy, cervical region: Secondary | ICD-10-CM | POA: Diagnosis not present

## 2023-12-03 LAB — GLUCOSE, CAPILLARY: Glucose-Capillary: 111 mg/dL — ABNORMAL HIGH (ref 70–99)

## 2023-12-03 MED ORDER — FLUDEOXYGLUCOSE F - 18 (FDG) INJECTION
8.3500 | Freq: Once | INTRAVENOUS | Status: AC
  Administered 2023-12-03: 8.35 via INTRAVENOUS

## 2023-12-08 ENCOUNTER — Other Ambulatory Visit: Payer: Self-pay

## 2023-12-08 NOTE — Progress Notes (Signed)
 Specialty Pharmacy Refill Coordination Note  Natasha Graham is a 63 y.o. female contacted today regarding refills of specialty medication(s) Abemaciclib [name])   Patient requested [name] at [facility] at [place] date: 12/15/23   Medication will be filled on 12/14/23.

## 2023-12-10 NOTE — Progress Notes (Unsigned)
 Patient Care Team: Hamrick, Orest Bio, MD as PCP - General (Family Medicine) Enid Harry, MD as Consulting Physician (General Surgery) Auther Bo, RN as Registered Nurse Alane Hsu, RN as Registered Nurse Dillingham, Lindaann Requena, DO as Attending Physician (Plastic Surgery) Retta Caster, MD as Consulting Physician (Radiation Oncology) Meriam Stamp, MD as Consulting Physician (Obstetrics and Gynecology) Althea Atkinson, RPH-CPP (Pharmacist) Sonja Clarktown, MD as Consulting Physician (Medical Oncology)  Clinic Day:  12/15/2023  Referring physician: Annette Barters, MD  ASSESSMENT & PLAN:   Assessment & Plan: Malignant neoplasm metastatic to bone [facility]) Metastatic Breast Cancer to bones, lobular carcinoma, ER+/PR+, HER2 IHC 2+ (low expression) -left breast cancer initially diagnosed 07/12/19. S/p left mastectomy on 09/27/19 showed stage IIIA p(T3, N1) ILC, SLND on 11/13/19 was negative (0/7). Mammaprint was low risk. -she received postmastectomy radiation 12/26/19 - 02/06/20 and took tamoxifen 02/29/20 - 07/2020 -genetics 06/27/20 were negative. -she was found to have bony metastatic disease in 07/2020, confirmed with bone biopsy 08/06/20. -she began Xgeva and fulvestrant on 08/15/20 and palbociclib on 09/12/20. She tolerates well overall. -FoundationOne testing obtained on her 07/2020 biopsy showed no targetable mutation.  -PET scan on 12/22/2022 showed treated diffuse bone mets, no hypermetabolic uptake  -Due to high co-pay, Anibal Kent was switched to Verzenio in February 2024, she is tolerating well with low-dose 100 mg twice daily, with occasional diarrhea. -continue Xgeva every 3 months  -PET scan done 12/03/2023 - PET scan shows no developing areas of abnormal radiotracer uptake.  Previously noted area of subtle uptake along the right pelvic sidewall is not well appreciated on most recent exam.  There continues to be extensive sclerotic bone disease.  These areas are without specific  abnormal radiotracer uptake - Continue anastrozole 1 mg daily and Verzenio 100 mg twice daily.  Xgeva injection today, for 16 2025.  Continue Xgeva injections every 3 months for now.  Consider extending this to every 6 months in the future.   Joint pain and fatigue Likely side effects from anastrozole and Verzenio.  Manageable.  Will take Tylenol as needed for more severe pain.  Plan: The patient was seen along with Dr. Grayland Le today. Reviewed labs.  Mild leukopenia which is stable.  Remainder of CBC and CMP are unremarkable. CA 27.29 results are pending. Reviewed most recent PET scan results with patient which show no new developing areas of radiotracer uptake.  Sclerotic bone lesions showing no evidence of radiotracer uptake. -Continue anastrozole 1 mg daily and 100 mg twice daily.  Will get Xgeva injection today. Labs, follow-up with Dr. Maryalice Smaller, and Xgeva injection in 3 months.  The patient understands the plans discussed today and is in agreement with them.  She knows to contact our office if she develops concerns prior to her next appointment.  I provided 25 minutes of face-to-face time during this encounter and > 50% was spent counseling as documented under my assessment and plan.    Sharyon Deis, NP  [facility] - A DEPT OF [facility] [address] [place] [address] Dept: [phone] Dept Fax: [phone]   No orders of the defined types were placed in this encounter.     CHIEF COMPLAINT:  CC: Breast cancer metastatic to bone  Current Treatment: Anastrozole, Verzenio, and Xgeva  INTERVAL HISTORY:  Natasha Graham is here today for repeat clinical assessment.  She was last seen on 09/15/2023 by Dr. Maryalice Smaller.  PET scan done  12/03/2023.  PET scan shows no developing areas of abnormal radiotracer uptake.  Previously noted area of subtle uptake along the right pelvic sidewall is not well appreciated on most recent exam.   There continues to be extensive sclerotic bone disease.  These areas are without specific abnormal radiotracer uptake.  She states that she does have generalized joint pain and some fatigue which are manageable.  Occasionally have some diarrhea related to Verzenio.  This is mild and managed without medications.  No new bony growth lower jaw.  Her dentist yesterday was not concerned and feels it is okay for her to continue with Xgeva injections.  She reports she has been on it injection since 2021.  Will consider extending time between injections to every 6 months in the future. She denies chest pain, chest pressure, or shortness of breath. She denies headaches or visual disturbances. She denies abdominal pain, nausea, vomiting, or changes in bowel or bladder habits.  She denies fevers or chills. She denies pain. Her appetite is good. Her weight has been stable.  I have reviewed the past medical history, past surgical history, social history and family history with the patient and they are unchanged from previous note.  ALLERGIES:  has no known allergies.  MEDICATIONS:  Current Outpatient Medications  Medication Sig Dispense Refill   abemaciclib (VERZENIO) 100 MG tablet Take 1 tablet (100 mg total) by mouth 2 (two) times daily. 56 tablet 2   ALOE VERA PO Take 1 capsule by mouth daily.     anastrozole (ARIMIDEX) 1 MG tablet Take 1 tablet (1 mg total) by mouth daily. 90 tablet 1   Ascorbic Acid (VITAMIN C) 1000 MG tablet Take 1,000 mg by mouth daily.     Calcium Carb-Cholecalciferol (CALCIUM PLUS VITAMIN D3) 600-20 MG-MCG TABS Take 1 tablet by mouth 2 (two) times daily.     cholecalciferol (VITAMIN D3) 25 MCG (1000 UNIT) tablet Take 1,000 Units by mouth daily.     Coenzyme Q10 (COQ10) 200 MG CAPS Take 200 mg by mouth daily.     denosumab (XGEVA) 120 MG/1.7ML SOLN injection Inject 120 mg into the skin every 3 (three) months.     Flaxseed Oil (LINSEED OIL) OIL Take 1,200 mg by mouth daily.      hydrochlorothiazide (HYDRODIURIL) 25 MG tablet Take 25 mg by mouth daily.     ibuprofen (ADVIL) 200 MG tablet Take 400 mg by mouth every 8 (eight) hours as needed for moderate pain.     lisinopril (ZESTRIL) 30 MG tablet Take 30 mg by mouth daily.     Magnesium 400 MG TABS Take 400 mg by mouth every evening.     Multiple Vitamin (MULTIVITAMIN) capsule Take 1 capsule by mouth daily.     Potassium 99 MG TABS Take 99 mg by mouth in the morning and at bedtime.     Probiotic Product (PROBIOTIC DAILY PO) Take 1 capsule by mouth 2 (two) times a week.     Turmeric 500 MG CAPS Take 500 mg by mouth 2 (two) times daily.     No current facility-administered medications for this visit.    HISTORY OF PRESENT ILLNESS:   Oncology History Overview Note   Cancer Staging  Malignant neoplasm metastatic to bone [facility]) Staging form: Bone - Appendicular Skeleton, Trunk, Skull, and Facial Bones, AJCC 8th Edition - Clinical: Stage IVB (cT3, cN1, cM1b) - Signed by Willy Harvest, MD on 08/15/2020  Malignant neoplasm of lower-outer quadrant of left breast of female, estrogen  receptor positive (HCC) Staging form: Breast, AJCC 8th Edition - Clinical: Stage IIIA (cT3, cN1, cM0, G2, ER+, PR-, HER2-) - Signed by Willy Harvest, MD on 08/15/2020     Breast neoplasm, Tis (DCIS), right (Resolved)  05/16/2014 Initial Diagnosis   Breast neoplasm, Tis (DCIS), right   06/27/2020 Genetic Testing   Negative genetic testing on the CancerNext-Expanded+RNAinsight panel testing.  The CancerNext-Expanded gene panel offered by [facility] and includes sequencing and rearrangement analysis for the following 77 genes: AIP, ALK, APC*, ATM*, AXIN2, BAP1, BARD1, BLM, BMPR1A, BRCA1*, BRCA2*, BRIP1*, CDC73, CDH1*, CDK4, CDKN1B, CDKN2A, CHEK2*, CTNNA1, DICER1, FANCC, FH, FLCN, GALNT12, KIF1B, LZTR1, MAX, MEN1, MET, MLH1*, MSH2*, MSH3, MSH6*, MUTYH*, NBN, NF1*, NF2, NTHL1, PALB2*, PHOX2B, PMS2*, POT1, PRKAR1A, PTCH1, PTEN*, RAD51C*,  RAD51D*, RB1, RECQL, RET, SDHA, SDHAF2, SDHB, SDHC, SDHD, SMAD4, SMARCA4, SMARCB1, SMARCE1, STK11, SUFU, TMEM127, TP53*, TSC1, TSC2, VHL and XRCC2 (sequencing and deletion/duplication); EGFR, EGLN1, HOXB13, KIT, MITF, PDGFRA, POLD1, and POLE (sequencing only); EPCAM and GREM1 (deletion/duplication only). DNA and RNA analyses performed for * genes. The report date is 06/27/2020.   Malignant neoplasm of lower-outer quadrant of left breast of female, estrogen receptor positive (HCC)  10/18/2019 Initial Diagnosis   Malignant neoplasm of lower-outer quadrant of left breast of female, estrogen receptor positive (HCC)   08/15/2020 Cancer Staging   Staging form: Breast, AJCC 8th Edition - Clinical: Stage IIIA (cT3, cN1, cM0, G2, ER+, PR-, HER2-) - Signed by Willy Harvest, MD on 08/15/2020   Malignant neoplasm metastatic to bone (HCC)  08/05/2020 Initial Diagnosis   Bone metastases (HCC)   08/15/2020 Cancer Staging   Staging form: Bone - Appendicular Skeleton, Trunk, Skull, and Facial Bones, AJCC 8th Edition - Clinical: Stage IVB (cT3, cN1, cM1b) - Signed by Willy Harvest, MD on 08/15/2020   12/21/2022 PET scan    IMPRESSION: Status post bilateral breast reconstruction with left axillary lymph node dissection.   Multifocal/diffuse treated osseous metastases, as above.   Otherwise, no evidence of recurrent or metastatic disease.     12/03/2023 PET scan   IMPRESSION: No developing areas of abnormal radiotracer uptake.   The subtle uptake along the right pelvic sidewall on the prior is not well seen today it may have been bowel or ureteral related.  Extensive sclerotic bone disease once again identified. These areas are without specific abnormal radiotracer uptake. Spinal fixation rods.       REVIEW OF SYSTEMS:   Constitutional: Denies fevers, chills or abnormal weight loss Eyes: Denies blurriness of vision Ears, nose, mouth, throat, and face: Denies mucositis or sore  throat Respiratory: Denies cough, dyspnea or wheezes Cardiovascular: Denies palpitation, chest discomfort or lower extremity swelling Gastrointestinal:  Denies nausea, heartburn or change in bowel habits Skin: Denies abnormal skin rashes Lymphatics: Denies new lymphadenopathy or easy bruising Neurological:Denies numbness, tingling or new weaknesses Behavioral/Psych: Mood is stable, no new changes  All other systems were reviewed with the patient and are negative.   VITALS:   Today's Vitals   12/15/23 0826 12/15/23 0831  BP: (!) 138/90 130/82  Pulse: (!) 105   Resp: 17   Temp: 98.7 F (37.1 C)   TempSrc: Temporal   SpO2: 99%   Weight: 169 lb 6.4 oz (76.8 kg)   PainSc: 0-No pain    Body mass index is 29.08 kg/m.   Wt Readings from Last 3 Encounters:  12/15/23 169 lb 6.4 oz (76.8 kg)  09/15/23 168 lb (76.2 kg)  09/14/23 167 lb 3.2 oz (75.8  kg)    Body mass index is 29.08 kg/m.  Performance status (ECOG): 1 - Symptomatic but completely ambulatory  PHYSICAL EXAM:   GENERAL:alert, no distress and comfortable SKIN: skin color, texture, turgor are normal, no rashes or significant lesions EYES: normal, Conjunctiva are pink and non-injected, sclera clear OROPHARYNX:no exudate, no erythema and lips, buccal mucosa, and tongue normal  NECK: supple, thyroid normal size, non-tender, without nodularity LYMPH:  no palpable lymphadenopathy in the cervical, axillary or inguinal LUNGS: clear to auscultation and percussion with normal breathing effort HEART: regular rate & rhythm and no murmurs and no lower extremity edema ABDOMEN:abdomen soft, non-tender and normal bowel sounds Musculoskeletal:no cyanosis of digits and no clubbing  NEURO: alert & oriented x 3 with fluent speech, no focal motor/sensory deficits BREAST: Left breast has surgical implant with nipple slightly everted to lateral aspect of the body.  Skin tightening and fibrosis present along outer aspect of the left breast.   No palpable lumps or masses along the chest wall on the left side.  No axillary lymphadenopathy on the left.  Right breast has surgical implant.  No palpable lumps or masses along the chest wall.  No nipple inversion or nipple discharge.  There is no axillary lymphadenopathy on the right side.  LABORATORY DATA:  I have reviewed the data as listed    Component Value Date/Time   NA 141 12/15/2023 0803   NA 143 07/30/2014 1603   K 3.5 12/15/2023 0803   K 3.5 07/30/2014 1603   CL 103 12/15/2023 0803   CO2 30 12/15/2023 0803   CO2 30 (H) 07/30/2014 1603   GLUCOSE 83 12/15/2023 0803   GLUCOSE 93 07/30/2014 1603   BUN 15 12/15/2023 0803   BUN 10.5 07/30/2014 1603   CREATININE 1.03 (H) 12/15/2023 0803   CREATININE 0.9 07/30/2014 1603   CALCIUM 9.7 12/15/2023 0803   CALCIUM 9.5 07/30/2014 1603   PROT 7.1 12/15/2023 0803   PROT 7.0 07/30/2014 1603   ALBUMIN 4.4 12/15/2023 0803   ALBUMIN 4.0 07/30/2014 1603   AST 20 12/15/2023 0803   AST 23 07/30/2014 1603   ALT 24 12/15/2023 0803   ALT 23 07/30/2014 1603   ALKPHOS 47 12/15/2023 0803   ALKPHOS 78 07/30/2014 1603   BILITOT 0.5 12/15/2023 0803   BILITOT 0.29 07/30/2014 1603   GFRNONAA >60 12/15/2023 0803   GFRAA >60 04/29/2020 0829     Lab Results  Component Value Date   WBC 3.4 (L) 12/15/2023   NEUTROABS 1.8 12/15/2023   HGB 12.8 12/15/2023   HCT 35.1 (L) 12/15/2023   MCV 100.6 (H) 12/15/2023   PLT 270 12/15/2023     RADIOGRAPHIC STUDIES: NM PET Image Restage (PS) Skull Base to Thigh (F-18 FDG) Result Date: 12/14/2023 CLINICAL DATA:  Subsequent treatment strategy for breast cancer. EXAM: NUCLEAR MEDICINE PET SKULL BASE TO THIGH TECHNIQUE: 8.35 mCi F-18 FDG was injected intravenously. Full-ring PET imaging was performed from the skull base to thigh after the radiotracer. CT data was obtained and used for attenuation correction and anatomic localization. Fasting blood glucose: 111 mg/dl COMPARISON:  PET-CT 16/06/9603.  Older  exams as well FINDINGS: Mediastinal blood pool activity: SUV max 2.7 Liver activity: SUV max 3.1 NECK: No specific abnormal radiotracer uptake is seen in the neck including along lymph node change of the submandibular, posterior triangle or internal jugular regions. Near symmetric uptake along the visualized portions of the intracranial compartment. Incidental CT findings: Visualized portions of the paranasal sinuses and mastoid air  cells are grossly clear except for some small mucous retention cysts or polyps along the inferior aspect of the maxillary sinuses. Slight streak artifact from the patient's dental hardware. The parotid glands, submandibular glands and thyroid gland are grossly unremarkable. One slight heterogeneity of the thyroid is seen, unchanged from previous. CHEST: No specific abnormal radiotracer uptake identified above blood pool in the axillary regions, hilum or mediastinum. No abnormal uptake along the lung parenchyma. Incidental CT findings: Bilateral breast implants are identified. Clips in the left axillary regions. Heart is nonenlarged. No pericardial effusion. The thoracic aorta is normal course and caliber with some calcified atherosclerotic plaque. Significant streak artifact identified from the patient's extensive spinal fixation hardware. Lungs are without consolidation, pneumothorax or effusion. Stable areas of interstitial septal thickening along the anterior left hemithorax along lung. Possible fibrosis. ABDOMEN/PELVIS: There is physiologic distribution radiotracer along the parenchymal organs, bowel and renal collecting systems. The previous area of uptake along the right pelvic sidewall is not well appreciated today. Incidental CT findings: Diffuse colonic stool identified. Few colonic diverticula. No bowel obstruction. Normal appendix in the right hemipelvis. Mild fatty liver infiltration. No renal or ureteral stones. Mild rotated left kidney which is somewhat lower in the  retroperitoneum the newly seen, upper pelvis. Congenital. No free air or free fluid. Underdistended urinary bladder. Slight rectus muscle diastasis. Previous fluid along the subcutaneous fat anteriorly along the mid abdomen abdominal wall has improved. SKELETON: Once again there is widespread sclerotic bone changes consistent with extensive osseous metastatic disease. Several punctate sclerotic foci identified most severely involving the spine, pelvis, femoral heads. Several areas as well along the clavicles, sternum and ribs. These areas today do not show abnormal radiotracer uptake. Harrington rods along thoracic spine. Degenerative changes seen particularly along the cervical spine, lumbar region there also some elsewhere. IMPRESSION: No developing areas of abnormal radiotracer uptake. The subtle uptake along the right pelvic sidewall on the prior is not well seen today it may have been bowel or ureteral related. Extensive sclerotic bone disease once again identified. These areas are without specific abnormal radiotracer uptake. Spinal fixation rods. Electronically Signed   By: Adrianna Horde M.D.   On: 12/14/2023 10:32    Addendum I have seen the patient, examined her. I agree with the assessment and and plan and have edited the notes.   Venessa is clinically doing well, denies any new symptoms.  I personally reviewed her restaging PET scan, which showed stable disease, her bone lesions are not hypermetabolic.  Will continue current therapy.  I answered all her questions.  Sonja Livingston  12/15/2023

## 2023-12-10 NOTE — Assessment & Plan Note (Addendum)
 Metastatic Breast Cancer to bones, lobular carcinoma, ER+/PR+, HER2 IHC 2+ (low expression) -left breast cancer initially diagnosed 07/12/19. S/p left mastectomy on 09/27/19 showed stage IIIA p(T3, N1) ILC, SLND on 11/13/19 was negative (0/7). Mammaprint was low risk. -she received postmastectomy radiation 12/26/19 - 02/06/20 and took tamoxifen 02/29/20 - 07/2020 -genetics 06/27/20 were negative. -she was found to have bony metastatic disease in 07/2020, confirmed with bone biopsy 08/06/20. -she began Xgeva and fulvestrant on 08/15/20 and palbociclib on 09/12/20. She tolerates well overall. -FoundationOne testing obtained on her 07/2020 biopsy showed no targetable mutation.  -PET scan on 12/22/2022 showed treated diffuse bone mets, no hypermetabolic uptake  -Due to high co-pay, Anibal Kent was switched to Verzenio in February 2024, she is tolerating well with low-dose 100 mg twice daily, with occasional diarrhea. -continue Xgeva every 3 months  -PET scan done 12/03/2023 - PET scan shows no developing areas of abnormal radiotracer uptake.  Previously noted area of subtle uptake along the right pelvic sidewall is not well appreciated on most recent exam.  There continues to be extensive sclerotic bone disease.  These areas are without specific abnormal radiotracer uptake - Continue anastrozole 1 mg daily and Verzenio 100 mg twice daily.  Xgeva injection today, for 16 2025.  Continue Xgeva injections every 3 months for now.  Consider extending this to every 6 months in the future.

## 2023-12-14 ENCOUNTER — Other Ambulatory Visit: Payer: Self-pay

## 2023-12-15 ENCOUNTER — Inpatient Hospital Stay: Payer: BC Managed Care – PPO | Attending: Hematology

## 2023-12-15 ENCOUNTER — Inpatient Hospital Stay: Payer: BC Managed Care – PPO

## 2023-12-15 ENCOUNTER — Inpatient Hospital Stay (HOSPITAL_BASED_OUTPATIENT_CLINIC_OR_DEPARTMENT_OTHER): Payer: BC Managed Care – PPO | Admitting: Nurse Practitioner

## 2023-12-15 VITALS — BP 130/82 | HR 105 | Temp 98.7°F | Resp 17 | Wt 169.4 lb

## 2023-12-15 DIAGNOSIS — C50512 Malignant neoplasm of lower-outer quadrant of left female breast: Secondary | ICD-10-CM | POA: Diagnosis not present

## 2023-12-15 DIAGNOSIS — Z9012 Acquired absence of left breast and nipple: Secondary | ICD-10-CM | POA: Diagnosis not present

## 2023-12-15 DIAGNOSIS — Z923 Personal history of irradiation: Secondary | ICD-10-CM | POA: Insufficient documentation

## 2023-12-15 DIAGNOSIS — C7951 Secondary malignant neoplasm of bone: Secondary | ICD-10-CM

## 2023-12-15 DIAGNOSIS — Z170 Estrogen receptor positive status [ER+]: Secondary | ICD-10-CM

## 2023-12-15 DIAGNOSIS — Z79899 Other long term (current) drug therapy: Secondary | ICD-10-CM | POA: Diagnosis not present

## 2023-12-15 LAB — CBC WITH DIFFERENTIAL (CANCER CENTER ONLY)
Abs Immature Granulocytes: 0.01 10*3/uL (ref 0.00–0.07)
Basophils Absolute: 0.1 10*3/uL (ref 0.0–0.1)
Basophils Relative: 2 %
Eosinophils Absolute: 0 10*3/uL (ref 0.0–0.5)
Eosinophils Relative: 1 %
HCT: 35.1 % — ABNORMAL LOW (ref 36.0–46.0)
Hemoglobin: 12.8 g/dL (ref 12.0–15.0)
Immature Granulocytes: 0 %
Lymphocytes Relative: 33 %
Lymphs Abs: 1.1 10*3/uL (ref 0.7–4.0)
MCH: 36.7 pg — ABNORMAL HIGH (ref 26.0–34.0)
MCHC: 36.5 g/dL — ABNORMAL HIGH (ref 30.0–36.0)
MCV: 100.6 fL — ABNORMAL HIGH (ref 80.0–100.0)
Monocytes Absolute: 0.3 10*3/uL (ref 0.1–1.0)
Monocytes Relative: 10 %
Neutro Abs: 1.8 10*3/uL (ref 1.7–7.7)
Neutrophils Relative %: 54 %
Platelet Count: 270 10*3/uL (ref 150–400)
RBC: 3.49 MIL/uL — ABNORMAL LOW (ref 3.87–5.11)
RDW: 12.2 % (ref 11.5–15.5)
WBC Count: 3.4 10*3/uL — ABNORMAL LOW (ref 4.0–10.5)
nRBC: 0 % (ref 0.0–0.2)

## 2023-12-15 LAB — CMP (CANCER CENTER ONLY)
ALT: 24 U/L (ref 0–44)
AST: 20 U/L (ref 15–41)
Albumin: 4.4 g/dL (ref 3.5–5.0)
Alkaline Phosphatase: 47 U/L (ref 38–126)
Anion gap: 8 (ref 5–15)
BUN: 15 mg/dL (ref 8–23)
CO2: 30 mmol/L (ref 22–32)
Calcium: 9.7 mg/dL (ref 8.9–10.3)
Chloride: 103 mmol/L (ref 98–111)
Creatinine: 1.03 mg/dL — ABNORMAL HIGH (ref 0.44–1.00)
GFR, Estimated: >60 mL/min (ref >60)
Glucose, Bld: 83 mg/dL (ref 70–99)
Potassium: 3.5 mmol/L (ref 3.5–5.1)
Sodium: 141 mmol/L (ref 135–145)
Total Bilirubin: 0.5 mg/dL (ref 0.0–1.2)
Total Protein: 7.1 g/dL (ref 6.5–8.1)

## 2023-12-15 MED ORDER — DENOSUMAB 120 MG/1.7ML ~~LOC~~ SOLN
120.0000 mg | Freq: Once | SUBCUTANEOUS | Status: AC
  Administered 2023-12-15: 120 mg via SUBCUTANEOUS
  Filled 2023-12-15: qty 1.7

## 2023-12-16 LAB — CANCER ANTIGEN 27.29: CA 27.29: 38.7 U/mL — ABNORMAL HIGH (ref 0.0–38.6)

## 2023-12-25 ENCOUNTER — Other Ambulatory Visit: Payer: Self-pay | Admitting: Hematology

## 2024-01-04 ENCOUNTER — Other Ambulatory Visit: Payer: Self-pay | Admitting: Hematology

## 2024-01-04 ENCOUNTER — Other Ambulatory Visit: Payer: Self-pay | Admitting: Pharmacy Technician

## 2024-01-04 ENCOUNTER — Other Ambulatory Visit (HOSPITAL_COMMUNITY): Payer: Self-pay

## 2024-01-04 ENCOUNTER — Other Ambulatory Visit: Payer: Self-pay

## 2024-01-04 MED ORDER — ABEMACICLIB 100 MG PO TABS
100.0000 mg | ORAL_TABLET | Freq: Two times a day (BID) | ORAL | 2 refills | Status: DC
  Filled 2024-01-04 (×2): qty 56, 28d supply, fill #0
  Filled 2024-02-04: qty 56, 28d supply, fill #1
  Filled 2024-02-24 – 2024-03-02 (×2): qty 56, 28d supply, fill #2

## 2024-01-04 NOTE — Progress Notes (Signed)
 Specialty Pharmacy Ongoing Clinical Assessment Note  Natasha Graham is a 63 y.o. female who is being followed by the specialty pharmacy service for RxSp Oncology   Patient's specialty medication(s) reviewed today: Abemaciclib  (VERZENIO )   Missed doses in the last 4 weeks: 0   Patient/Caregiver did not have any additional questions or concerns.   Therapeutic benefit summary: Patient is achieving benefit   Adverse events/side effects summary: Experienced adverse events/side effects (diarrhea which may have been due to a stomach bug but took longer to resolve than she expected, has now resolved.)   Patient's therapy is appropriate to: Continue    Goals Addressed             This Visit's Progress    Slow Disease Progression       Patient is on track. Patient will maintain adherence.  Most recent visit notes from 4/16 state that last PET scan did not show signs of progression.         Follow up:  6 months  [name] M Mayo Faulk Specialty Pharmacist

## 2024-01-04 NOTE — Progress Notes (Signed)
 Specialty Pharmacy Refill Coordination Note  Natasha Graham is a 63 y.o. female contacted today regarding refills of specialty medication(s) Abemaciclib  (VERZENIO )   Patient requested [name] at [facility] at [place] date: 01/11/24   Medication will be filled on 01/11/24.

## 2024-01-05 ENCOUNTER — Other Ambulatory Visit: Payer: Self-pay

## 2024-01-21 DIAGNOSIS — Z23 Encounter for immunization: Secondary | ICD-10-CM | POA: Diagnosis not present

## 2024-01-21 DIAGNOSIS — I10 Essential (primary) hypertension: Secondary | ICD-10-CM | POA: Diagnosis not present

## 2024-01-21 DIAGNOSIS — K760 Fatty (change of) liver, not elsewhere classified: Secondary | ICD-10-CM | POA: Diagnosis not present

## 2024-01-21 DIAGNOSIS — C50912 Malignant neoplasm of unspecified site of left female breast: Secondary | ICD-10-CM | POA: Diagnosis not present

## 2024-01-21 DIAGNOSIS — Z1331 Encounter for screening for depression: Secondary | ICD-10-CM | POA: Diagnosis not present

## 2024-01-21 DIAGNOSIS — Z131 Encounter for screening for diabetes mellitus: Secondary | ICD-10-CM | POA: Diagnosis not present

## 2024-01-21 DIAGNOSIS — E7800 Pure hypercholesterolemia, unspecified: Secondary | ICD-10-CM | POA: Diagnosis not present

## 2024-02-04 ENCOUNTER — Other Ambulatory Visit: Payer: Self-pay

## 2024-02-04 NOTE — Progress Notes (Signed)
 Specialty Pharmacy Refill Coordination Note  Natasha Graham is a 63 y.o. female contacted today regarding refills of specialty medication(s) Abemaciclib  (VERZENIO )   Patient requested [name] at [facility] at [place] date: 02/09/24   Medication will be filled on 02/08/24.

## 2024-02-08 ENCOUNTER — Other Ambulatory Visit: Payer: Self-pay

## 2024-02-22 ENCOUNTER — Encounter: Payer: Self-pay | Admitting: Plastic Surgery

## 2024-02-22 ENCOUNTER — Ambulatory Visit (INDEPENDENT_AMBULATORY_CARE_PROVIDER_SITE_OTHER): Admitting: Plastic Surgery

## 2024-02-22 VITALS — BP 172/80 | HR 87

## 2024-02-22 DIAGNOSIS — N651 Disproportion of reconstructed breast: Secondary | ICD-10-CM | POA: Diagnosis not present

## 2024-02-22 DIAGNOSIS — C50512 Malignant neoplasm of lower-outer quadrant of left female breast: Secondary | ICD-10-CM | POA: Diagnosis not present

## 2024-02-22 DIAGNOSIS — Z9011 Acquired absence of right breast and nipple: Secondary | ICD-10-CM | POA: Diagnosis not present

## 2024-02-22 DIAGNOSIS — Z170 Estrogen receptor positive status [ER+]: Secondary | ICD-10-CM | POA: Diagnosis not present

## 2024-02-22 NOTE — Progress Notes (Signed)
   Subjective:    Patient ID: Natasha Graham, female    DOB: 1960-12-19, 63 y.o.   MRN: [id number]  The patient is a 63 year old female here for follow-up on her breast reconstruction.  After abnormal mammograms the patient was diagnosed with right breast cancer in 2015 and then in 2021 she decided to do a left mastectomy.  In July 2022 she had removal of the left expander with placement of an implant so now she has a 350 cc implant on the left and a 450 cc on the right.  She does have a spinal rod due to scoliosis.  She has noticed a lot of contracture and tightening on the left due to the radiation and the scar contracture.  She would like to see if something could be done to help release that and get her better symmetry.      Review of Systems  Constitutional: Negative.   Eyes: Negative.   Respiratory: Negative.    Cardiovascular: Negative.   Gastrointestinal: Negative.   Genitourinary: Negative.   Musculoskeletal: Negative.        Objective:   Physical Exam Vitals reviewed.  HENT:     Head: Atraumatic.   Cardiovascular:     Rate and Rhythm: Normal rate.     Pulses: Normal pulses.  Pulmonary:     Effort: Pulmonary effort is normal.  Abdominal:     Palpations: Abdomen is soft.   Skin:    General: Skin is warm.     Capillary Refill: Capillary refill takes less than 2 seconds.   Neurological:     Mental Status: She is alert and oriented to person, place, and time.   Psychiatric:        Mood and Affect: Mood normal.        Behavior: Behavior normal.        Thought Content: Thought content normal.        Judgment: Judgment normal.         Assessment & Plan:     ICD-10-CM   1. Malignant neoplasm of lower-outer quadrant of left breast of female, estrogen receptor positive (HCC)  C50.512    Z17.0     2. Acquired absence of right breast  Z90.11     3. Breast asymmetry following reconstructive surgery  N65.1       Pictures were obtained of the patient and  placed in the chart with the patient's or guardian's permission. Think that the patient is a good candidate for releasing that lateral contracture and trying to do some fat filling on the lateral left breast and then a little on the medial right breast.

## 2024-02-24 ENCOUNTER — Other Ambulatory Visit: Payer: Self-pay

## 2024-02-25 ENCOUNTER — Encounter: Payer: Self-pay | Admitting: Surgical

## 2024-02-25 ENCOUNTER — Encounter: Payer: Self-pay | Admitting: Nurse Practitioner

## 2024-02-25 ENCOUNTER — Ambulatory Visit (INDEPENDENT_AMBULATORY_CARE_PROVIDER_SITE_OTHER): Admitting: Surgical

## 2024-02-25 VITALS — BP 155/88 | HR 81 | Ht 63.5 in | Wt 171.8 lb

## 2024-02-25 DIAGNOSIS — Z9011 Acquired absence of right breast and nipple: Secondary | ICD-10-CM

## 2024-02-25 DIAGNOSIS — Z9012 Acquired absence of left breast and nipple: Secondary | ICD-10-CM

## 2024-02-25 DIAGNOSIS — N651 Disproportion of reconstructed breast: Secondary | ICD-10-CM

## 2024-02-25 DIAGNOSIS — Z9889 Other specified postprocedural states: Secondary | ICD-10-CM

## 2024-02-25 DIAGNOSIS — Z170 Estrogen receptor positive status [ER+]: Secondary | ICD-10-CM

## 2024-02-25 DIAGNOSIS — C50512 Malignant neoplasm of lower-outer quadrant of left female breast: Secondary | ICD-10-CM

## 2024-02-25 NOTE — H&P (View-Only) (Signed)
 Patient ID: Natasha Graham, female    DOB: 1960-10-02, 63 y.o.   MRN: [id number]  Chief Complaint  Patient presents with   Pre-op Exam      ICD-10-CM   1. Malignant neoplasm of lower-outer quadrant of left breast of female, estrogen receptor positive (HCC)  C50.512    Z17.0     2. Status post right breast reconstruction  Z98.890     3. S/P mastectomy, left  Z90.12     4. Acquired absence of right breast  Z90.11     5. Breast asymmetry following reconstructive surgery  N65.1       History of Present Illness: Natasha Graham is a 63 y.o.  female  with a history of breast reconstruction, radiation to the left breast.  She presents for preoperative evaluation for upcoming procedure, fat grafting to bilateral breast, liposuction of abdomen for fat grafting, release of left breast scar contracture, scheduled for 03/16/2024 with Dr. Lowery.  The patient has not had problems with anesthesia. No history of DVT/PE.  No family history of DVT/PE.  No family or personal history of bleeding or clotting disorders.  Patient is not currently taking any blood thinners.  No history of CVA/MI.   Patient denies any history of cardiac or pulmonary disease, denies any cardiac or pulmonary symptoms.   Summary of Previous Visit: diagnosed with right breast cancer in 2015 and then in 2021 she decided to do a left mastectomy. In July 2022 she had removal of the left expander with placement of an implant so now she has a 350 cc implant on the left and a 450 cc on the right. She does have a spinal rod due to scoliosis. She has noticed a lot of contracture and tightening on the left due to the radiation and the scar contracture.  PMH Significant for: Radiation to left breast, breast cancer, currently on Verzenio  daily, also receives Xgeva  injections every 3 months, aortic atherosclerosis, history of bone mets.  She is on OTC supplements which she is aware to hold 2 weeks prior to surgery.  She  denies any cardiac or pulmonary disease today.  Her blood pressure was slightly elevated, however she reports no vision changes headaches or any other symptomatic changes.  She reports she had a hernia repair about a year ago, tolerated this well.   Past Medical History: Allergies: No Known Allergies  Current Medications:  Current Outpatient Medications:    abemaciclib  (VERZENIO ) 100 MG tablet, Take 1 tablet (100 mg total) by mouth 2 (two) times daily., Disp: 56 tablet, Rfl: 2   ALOE VERA PO, Take 1 capsule by mouth daily., Disp: , Rfl:    anastrozole  (ARIMIDEX ) 1 MG tablet, Take 1 tablet by mouth once daily, Disp: 90 tablet, Rfl: 0   Ascorbic Acid (VITAMIN C) 1000 MG tablet, Take 1,000 mg by mouth daily., Disp: , Rfl:    Calcium Carb-Cholecalciferol (CALCIUM PLUS VITAMIN D3) 600-20 MG-MCG TABS, Take 1 tablet by mouth 2 (two) times daily., Disp: , Rfl:    cholecalciferol (VITAMIN D3) 25 MCG (1000 UNIT) tablet, Take 1,000 Units by mouth daily., Disp: , Rfl:    Coenzyme Q10 (COQ10) 200 MG CAPS, Take 200 mg by mouth daily., Disp: , Rfl:    denosumab  (XGEVA ) 120 MG/1.7ML SOLN injection, Inject 120 mg into the skin every 3 (three) months., Disp: , Rfl:    Flaxseed Oil (LINSEED OIL) OIL, Take 1,200 mg by mouth daily., Disp: , Rfl:  hydrochlorothiazide  (HYDRODIURIL ) 25 MG tablet, Take 25 mg by mouth daily., Disp: , Rfl:    ibuprofen  (ADVIL ) 200 MG tablet, Take 400 mg by mouth every 8 (eight) hours as needed for moderate pain., Disp: , Rfl:    lisinopril  (ZESTRIL ) 30 MG tablet, Take 30 mg by mouth daily., Disp: , Rfl:    Magnesium 400 MG TABS, Take 400 mg by mouth every evening., Disp: , Rfl:    Multiple Vitamin (MULTIVITAMIN) capsule, Take 1 capsule by mouth daily., Disp: , Rfl:    Potassium 99 MG TABS, Take 99 mg by mouth in the morning and at bedtime., Disp: , Rfl:    Probiotic Product (PROBIOTIC DAILY PO), Take 1 capsule by mouth 2 (two) times a week., Disp: , Rfl:    Turmeric 500 MG CAPS,  Take 500 mg by mouth 2 (two) times daily., Disp: , Rfl:   Past Medical Problems: Past Medical History:  Diagnosis Date   Cancer (HCC)    breast cancer left 2021 roght 2015   Family history of brain cancer    Family history of breast cancer    Family history of colon cancer    Family history of stomach cancer    History of radiation therapy last done February 06 2020   Hypertension    PMB (postmenopausal bleeding)    PONV (postoperative nausea and vomiting)    likes scopolamine  patch   Scoliosis    Wears glasses    Wears glasses     Past Surgical History: Past Surgical History:  Procedure Laterality Date   AUGMENTATION MAMMAPLASTY Right    2015 post mastectomy   AXILLARY LYMPH NODE DISSECTION Left 11/13/2019   Procedure: LEFT AXILLARY LYMPH NODE DISSECTION;  Surgeon: Ebbie Cough, MD;  Location: [facility];  Service: General;  Laterality: Left;   BREAST IMPLANT EXCHANGE Right 03/19/2021   Procedure: exchange of right saline implant;  Surgeon: Lowery Estefana RAMAN, DO;  Location: [facility];  Service: Plastics;  Laterality: Right;   BREAST RECONSTRUCTION WITH PLACEMENT OF TISSUE EXPANDER AND FLEX HD (ACELLULAR HYDRATED DERMIS) Right 05/16/2014   Procedure: IMMEDIATE RIGHT BREAST RECONSTRUCTION WITH PLACEMENT OF TISSUE EXPANDER AND FLEX HD (ACELLULAR HYDRATED DERMIS);  Surgeon: Estefana Reichert, DO;  Location: [facility];  Service: Plastics;  Laterality: Right;   BREAST RECONSTRUCTION WITH PLACEMENT OF TISSUE EXPANDER AND FLEX HD (ACELLULAR HYDRATED DERMIS) Left 09/27/2019   Procedure: LEFT BREAST RECONSTRUCTION WITH PLACEMENT OF TISSUE EXPANDER AND FLEX HD (ACELLULAR HYDRATED DERMIS);  Surgeon: Lowery Estefana RAMAN, DO;  Location: [facility];  Service: Plastics;  Laterality: Left;   BREAST SURGERY     right breast excisional biopsy   DILATATION & CURETTAGE/HYSTEROSCOPY WITH MYOSURE N/A 12/20/2020   Procedure: DILATATION & CURETTAGE/HYSTEROSCOPY WITH   MYOSURE;  Surgeon: Gorge Ade, MD;  Location: [facility];  Service: Gynecology;  Laterality: N/A;   DILATION AND CURETTAGE OF UTERUS     HYSTEROSCOPY WITH D & C N/A 09/20/2015   Procedure: DILATATION AND CURETTAGE /HYSTEROSCOPY;  Surgeon: Ade Gorge, MD;  Location: [facility];  Service: Gynecology;  Laterality: N/A;   INCISIONAL HERNIA REPAIR N/A 04/27/2023   Procedure: LAPAROSCOPIC ASSISTED INCISIONAL HERNIA WITH MESH;  Surgeon: Ebbie Cough, MD;  Location: [facility];  Service: General;  Laterality: N/A;  GEN/TAP BLOCK   LIPOSUCTION Bilateral 08/08/2014   Procedure: LIPO SUCTION ;  Surgeon: Estefana Reichert, DO;  Location:  SURGERY CENTER;  Service: Plastics;  Laterality: Bilateral;   MASTECTOMY  Right 05/16/2014   placement of acellular dermal matrix & tissue expanders    MASTOPEXY Left 08/08/2014   Procedure:  MASTOPEXY FOR SYMMETRY;  Surgeon: Estefana Reichert, DO;  Location: [facility];  Service: Plastics;  Laterality: Left;   NIPPLE SPARING MASTECTOMY WITH SENTINEL LYMPH NODE BIOPSY Left 09/27/2019   Procedure: LEFT NIPPLE SPARING MASTECTOMY WITH LEFT AXILLARY SENTINEL LYMPH NODE BIOPSY;  Surgeon: Ebbie Cough, MD;  Location: [facility];  Service: General;  Laterality: Left;   REDUCTION MAMMAPLASTY Left    2015   REMOVAL OF TISSUE EXPANDER AND PLACEMENT OF IMPLANT Right 08/08/2014   Procedure: REMOVAL OF RIGHT  TISSUE EXPANDERS WITH PLACEMENT OF RIGHT BREAST IMPLANTS WITH LIPO SUCTION ;  Surgeon: Estefana Reichert, DO;  Location: [facility];  Service: Plastics;  Laterality: Right;   REMOVAL OF TISSUE EXPANDER AND PLACEMENT OF IMPLANT Left 03/19/2021   Procedure: Removal of left expander for saline implant;  Surgeon: Lowery Estefana RAMAN, DO;  Location: [facility];  Service: Plastics;  Laterality: Left;  2 hours   ROBOTIC ASSISTED TOTAL HYSTERECTOMY WITH BILATERAL SALPINGO OOPHERECTOMY Bilateral 02/06/2021    Procedure: XI ROBOTIC ASSISTED TOTAL HYSTERECTOMY WITH BILATERAL SALPINGO OOPHORECTOMY, LYSIS of SIGMOID ADHESIONS MCCALL CUL DE PLASTY;  Surgeon: Gorge Ade, MD;  Location: [facility];  Service: Gynecology;  Laterality: Bilateral;   SCAR REVISION Left 11/13/2019   Procedure: EXCISION OF LEFT MASTECTOMY SKIN;  Surgeon: Ebbie Cough, MD;  Location: [facility];  Service: General;  Laterality: Left;   scoliosis  1972   harrington rods-age 25   TONSILLECTOMY  as child    Social History: Social History   Socioeconomic History   Marital status: Married    Spouse name: Not on file   Number of children: 3   Years of education: Not on file   Highest education level: Not on file  Occupational History   Not on file  Tobacco Use   Smoking status: Never   Smokeless tobacco: Never  Vaping Use   Vaping status: Never Used  Substance and Sexual Activity   Alcohol use: No   Drug use: No   Sexual activity: Not on file  Other Topics Concern   Not on file  Social History Narrative   Not on file   Social Drivers of Health   Financial Resource Strain: Not on file  Food Insecurity: No Food Insecurity (04/27/2023)   Hunger Vital Sign    Worried About Running Out of Food in the Last Year: Never true    Ran Out of Food in the Last Year: Never true  Transportation Needs: No Transportation Needs (04/27/2023)   PRAPARE - Administrator, Civil Service (Medical): No    Lack of Transportation (Non-Medical): No  Physical Activity: Not on file  Stress: Not on file  Social Connections: Not on file  Intimate Partner Violence: Not At Risk (04/27/2023)   Humiliation, Afraid, Rape, and Kick questionnaire    Fear of Current or Ex-Partner: No    Emotionally Abused: No    Physically Abused: No    Sexually Abused: No    Family History: Family History  Problem Relation Age of Onset   Heart disease Mother    Cancer Father        liver   Colon cancer  Father 79   Head & neck cancer Father        oral cancer - dx in 43s-50s  Breast cancer Paternal Aunt        dx <50   Brain cancer Brother        possible meningioma   Cancer Maternal Aunt        liver/lung cancer   Stomach cancer Paternal Uncle    Heart disease Maternal Grandmother    Lymphoma Maternal Grandfather        NHL   Heart attack Paternal Grandfather    Cancer Paternal Uncle        NOS    Review of Systems: Review of Systems  Constitutional: Negative.   Respiratory: Negative.    Cardiovascular: Negative.   Gastrointestinal: Negative.   Neurological: Negative.     Physical Exam: Vital Signs BP (!) 155/88 (BP Location: Right Arm, Patient Position: Sitting, Cuff Size: Large)   Pulse 81   Ht 5' 3.5 (1.613 m)   Wt 171 lb 12.8 oz (77.9 kg)   LMP 10/16/2013   SpO2 99%   BMI 29.96 kg/m   Physical Exam Constitutional:      General: Not in acute distress.    Appearance: Normal appearance. Not ill-appearing.  HENT:     Head: Normocephalic and atraumatic.  Eyes:     Pupils: Pupils are equal, round Neck:     Musculoskeletal: Normal range of motion.  Cardiovascular:     Rate and Rhythm: Normal rate    Pulses: Normal pulses.  Pulmonary:     Effort: Pulmonary effort is normal. No respiratory distress.  Abdominal:     General: Abdomen is flat. There is no distension.  Musculoskeletal: Normal range of motion.  Skin:    General: Skin is warm and dry.     Findings: No erythema or rash.  Neurological:     General: No focal deficit present.     Mental Status: Alert and oriented to person, place, and time. Mental status is at baseline.     Motor: No weakness.  Psychiatric:        Mood and Affect: Mood normal.        Behavior: Behavior normal.    Assessment/Plan: The patient is scheduled for fat grafting to bilateral breasts, release of scar contracture of left breast, liposuction of abdomen with Dr. Lowery.  Risks, benefits, and alternatives of procedure  discussed, questions answered and consent obtained.    Smoking Status: Non smoker; Counseling Given?  N/A Last Mammogram: Status post bilateral mastectomy  Caprini Score: 7; Risk Factors include: age, BMI > 25, hx of cancer, and length of planned surgery. Recommendation for mechanical prophylaxis. Encourage early ambulation.   Pictures obtained: @consult   Post-op Rx sent to pharmacy: Oxycodone , Zofran , Keflex   Patient was provided with the General Surgical Risk consent document and Pain Medication Agreement prior to their appointment.  They had adequate time to read through the risk consent documents and Pain Medication Agreement. We also discussed them in person together during this preop appointment. All of their questions were answered to their satisfaction.  Recommended calling if they have any further questions.  Risk consent form and Pain Medication Agreement to be scanned into patient's chart.  The risks that can be encountered with and after liposuction were discussed and include the following but no limited to these:  Asymmetry, fluid accumulation, firmness of the area, fat necrosis with death of fat tissue, bleeding, infection, delayed healing, anesthesia risks, skin sensation changes, injury to structures including nerves, blood vessels, and muscles which may be temporary or permanent, allergies to tape, suture materials and  glues, blood products, topical preparations or injected agents, skin and contour irregularities, skin discoloration and swelling, deep vein thrombosis, cardiac and pulmonary complications, pain, which may persist, persistent pain, recurrence of the lesion, poor healing of the incision, possible need for revisional surgery or staged procedures. Thiere can also be persistent swelling, poor wound healing, rippling or loose skin, worsening of cellulite, swelling, and thermal burn or heat injury from ultrasound with the ultrasound-assisted lipoplasty technique. Any change in  weight fluctuations can alter the outcome.  Reached out to patient's oncology team to discuss holding Verzenio  and Xgeva  prior to surgery  Electronically signed by: Donnice PARAS Dorothy Landgrebe, PA-C 02/25/2024 9:44 AM

## 2024-02-25 NOTE — Progress Notes (Signed)
 Patient ID: Natasha Graham, female    DOB: 1960-10-02, 63 y.o.   MRN: [id number]  Chief Complaint  Patient presents with   Pre-op Exam      ICD-10-CM   1. Malignant neoplasm of lower-outer quadrant of left breast of female, estrogen receptor positive (HCC)  C50.512    Z17.0     2. Status post right breast reconstruction  Z98.890     3. S/P mastectomy, left  Z90.12     4. Acquired absence of right breast  Z90.11     5. Breast asymmetry following reconstructive surgery  N65.1       History of Present Illness: Natasha Graham is a 63 y.o.  female  with a history of breast reconstruction, radiation to the left breast.  She presents for preoperative evaluation for upcoming procedure, fat grafting to bilateral breast, liposuction of abdomen for fat grafting, release of left breast scar contracture, scheduled for 03/16/2024 with Dr. Lowery.  The patient has not had problems with anesthesia. No history of DVT/PE.  No family history of DVT/PE.  No family or personal history of bleeding or clotting disorders.  Patient is not currently taking any blood thinners.  No history of CVA/MI.   Patient denies any history of cardiac or pulmonary disease, denies any cardiac or pulmonary symptoms.   Summary of Previous Visit: diagnosed with right breast cancer in 2015 and then in 2021 she decided to do a left mastectomy. In July 2022 she had removal of the left expander with placement of an implant so now she has a 350 cc implant on the left and a 450 cc on the right. She does have a spinal rod due to scoliosis. She has noticed a lot of contracture and tightening on the left due to the radiation and the scar contracture.  PMH Significant for: Radiation to left breast, breast cancer, currently on Verzenio  daily, also receives Xgeva  injections every 3 months, aortic atherosclerosis, history of bone mets.  She is on OTC supplements which she is aware to hold 2 weeks prior to surgery.  She  denies any cardiac or pulmonary disease today.  Her blood pressure was slightly elevated, however she reports no vision changes headaches or any other symptomatic changes.  She reports she had a hernia repair about a year ago, tolerated this well.   Past Medical History: Allergies: No Known Allergies  Current Medications:  Current Outpatient Medications:    abemaciclib  (VERZENIO ) 100 MG tablet, Take 1 tablet (100 mg total) by mouth 2 (two) times daily., Disp: 56 tablet, Rfl: 2   ALOE VERA PO, Take 1 capsule by mouth daily., Disp: , Rfl:    anastrozole  (ARIMIDEX ) 1 MG tablet, Take 1 tablet by mouth once daily, Disp: 90 tablet, Rfl: 0   Ascorbic Acid (VITAMIN C) 1000 MG tablet, Take 1,000 mg by mouth daily., Disp: , Rfl:    Calcium Carb-Cholecalciferol (CALCIUM PLUS VITAMIN D3) 600-20 MG-MCG TABS, Take 1 tablet by mouth 2 (two) times daily., Disp: , Rfl:    cholecalciferol (VITAMIN D3) 25 MCG (1000 UNIT) tablet, Take 1,000 Units by mouth daily., Disp: , Rfl:    Coenzyme Q10 (COQ10) 200 MG CAPS, Take 200 mg by mouth daily., Disp: , Rfl:    denosumab  (XGEVA ) 120 MG/1.7ML SOLN injection, Inject 120 mg into the skin every 3 (three) months., Disp: , Rfl:    Flaxseed Oil (LINSEED OIL) OIL, Take 1,200 mg by mouth daily., Disp: , Rfl:  hydrochlorothiazide  (HYDRODIURIL ) 25 MG tablet, Take 25 mg by mouth daily., Disp: , Rfl:    ibuprofen  (ADVIL ) 200 MG tablet, Take 400 mg by mouth every 8 (eight) hours as needed for moderate pain., Disp: , Rfl:    lisinopril  (ZESTRIL ) 30 MG tablet, Take 30 mg by mouth daily., Disp: , Rfl:    Magnesium 400 MG TABS, Take 400 mg by mouth every evening., Disp: , Rfl:    Multiple Vitamin (MULTIVITAMIN) capsule, Take 1 capsule by mouth daily., Disp: , Rfl:    Potassium 99 MG TABS, Take 99 mg by mouth in the morning and at bedtime., Disp: , Rfl:    Probiotic Product (PROBIOTIC DAILY PO), Take 1 capsule by mouth 2 (two) times a week., Disp: , Rfl:    Turmeric 500 MG CAPS,  Take 500 mg by mouth 2 (two) times daily., Disp: , Rfl:   Past Medical Problems: Past Medical History:  Diagnosis Date   Cancer (HCC)    breast cancer left 2021 roght 2015   Family history of brain cancer    Family history of breast cancer    Family history of colon cancer    Family history of stomach cancer    History of radiation therapy last done February 06 2020   Hypertension    PMB (postmenopausal bleeding)    PONV (postoperative nausea and vomiting)    likes scopolamine  patch   Scoliosis    Wears glasses    Wears glasses     Past Surgical History: Past Surgical History:  Procedure Laterality Date   AUGMENTATION MAMMAPLASTY Right    2015 post mastectomy   AXILLARY LYMPH NODE DISSECTION Left 11/13/2019   Procedure: LEFT AXILLARY LYMPH NODE DISSECTION;  Surgeon: Ebbie Cough, MD;  Location: [facility];  Service: General;  Laterality: Left;   BREAST IMPLANT EXCHANGE Right 03/19/2021   Procedure: exchange of right saline implant;  Surgeon: Lowery Estefana RAMAN, DO;  Location: [facility];  Service: Plastics;  Laterality: Right;   BREAST RECONSTRUCTION WITH PLACEMENT OF TISSUE EXPANDER AND FLEX HD (ACELLULAR HYDRATED DERMIS) Right 05/16/2014   Procedure: IMMEDIATE RIGHT BREAST RECONSTRUCTION WITH PLACEMENT OF TISSUE EXPANDER AND FLEX HD (ACELLULAR HYDRATED DERMIS);  Surgeon: Estefana Reichert, DO;  Location: [facility];  Service: Plastics;  Laterality: Right;   BREAST RECONSTRUCTION WITH PLACEMENT OF TISSUE EXPANDER AND FLEX HD (ACELLULAR HYDRATED DERMIS) Left 09/27/2019   Procedure: LEFT BREAST RECONSTRUCTION WITH PLACEMENT OF TISSUE EXPANDER AND FLEX HD (ACELLULAR HYDRATED DERMIS);  Surgeon: Lowery Estefana RAMAN, DO;  Location: [facility];  Service: Plastics;  Laterality: Left;   BREAST SURGERY     right breast excisional biopsy   DILATATION & CURETTAGE/HYSTEROSCOPY WITH MYOSURE N/A 12/20/2020   Procedure: DILATATION & CURETTAGE/HYSTEROSCOPY WITH   MYOSURE;  Surgeon: Gorge Ade, MD;  Location: [facility];  Service: Gynecology;  Laterality: N/A;   DILATION AND CURETTAGE OF UTERUS     HYSTEROSCOPY WITH D & C N/A 09/20/2015   Procedure: DILATATION AND CURETTAGE /HYSTEROSCOPY;  Surgeon: Ade Gorge, MD;  Location: [facility];  Service: Gynecology;  Laterality: N/A;   INCISIONAL HERNIA REPAIR N/A 04/27/2023   Procedure: LAPAROSCOPIC ASSISTED INCISIONAL HERNIA WITH MESH;  Surgeon: Ebbie Cough, MD;  Location: [facility];  Service: General;  Laterality: N/A;  GEN/TAP BLOCK   LIPOSUCTION Bilateral 08/08/2014   Procedure: LIPO SUCTION ;  Surgeon: Estefana Reichert, DO;  Location:  SURGERY CENTER;  Service: Plastics;  Laterality: Bilateral;   MASTECTOMY  Right 05/16/2014   placement of acellular dermal matrix & tissue expanders    MASTOPEXY Left 08/08/2014   Procedure:  MASTOPEXY FOR SYMMETRY;  Surgeon: Estefana Reichert, DO;  Location: [facility];  Service: Plastics;  Laterality: Left;   NIPPLE SPARING MASTECTOMY WITH SENTINEL LYMPH NODE BIOPSY Left 09/27/2019   Procedure: LEFT NIPPLE SPARING MASTECTOMY WITH LEFT AXILLARY SENTINEL LYMPH NODE BIOPSY;  Surgeon: Ebbie Cough, MD;  Location: [facility];  Service: General;  Laterality: Left;   REDUCTION MAMMAPLASTY Left    2015   REMOVAL OF TISSUE EXPANDER AND PLACEMENT OF IMPLANT Right 08/08/2014   Procedure: REMOVAL OF RIGHT  TISSUE EXPANDERS WITH PLACEMENT OF RIGHT BREAST IMPLANTS WITH LIPO SUCTION ;  Surgeon: Estefana Reichert, DO;  Location: [facility];  Service: Plastics;  Laterality: Right;   REMOVAL OF TISSUE EXPANDER AND PLACEMENT OF IMPLANT Left 03/19/2021   Procedure: Removal of left expander for saline implant;  Surgeon: Lowery Estefana RAMAN, DO;  Location: [facility];  Service: Plastics;  Laterality: Left;  2 hours   ROBOTIC ASSISTED TOTAL HYSTERECTOMY WITH BILATERAL SALPINGO OOPHERECTOMY Bilateral 02/06/2021    Procedure: XI ROBOTIC ASSISTED TOTAL HYSTERECTOMY WITH BILATERAL SALPINGO OOPHORECTOMY, LYSIS of SIGMOID ADHESIONS MCCALL CUL DE PLASTY;  Surgeon: Gorge Ade, MD;  Location: [facility];  Service: Gynecology;  Laterality: Bilateral;   SCAR REVISION Left 11/13/2019   Procedure: EXCISION OF LEFT MASTECTOMY SKIN;  Surgeon: Ebbie Cough, MD;  Location: [facility];  Service: General;  Laterality: Left;   scoliosis  1972   harrington rods-age 25   TONSILLECTOMY  as child    Social History: Social History   Socioeconomic History   Marital status: Married    Spouse name: Not on file   Number of children: 3   Years of education: Not on file   Highest education level: Not on file  Occupational History   Not on file  Tobacco Use   Smoking status: Never   Smokeless tobacco: Never  Vaping Use   Vaping status: Never Used  Substance and Sexual Activity   Alcohol use: No   Drug use: No   Sexual activity: Not on file  Other Topics Concern   Not on file  Social History Narrative   Not on file   Social Drivers of Health   Financial Resource Strain: Not on file  Food Insecurity: No Food Insecurity (04/27/2023)   Hunger Vital Sign    Worried About Running Out of Food in the Last Year: Never true    Ran Out of Food in the Last Year: Never true  Transportation Needs: No Transportation Needs (04/27/2023)   PRAPARE - Administrator, Civil Service (Medical): No    Lack of Transportation (Non-Medical): No  Physical Activity: Not on file  Stress: Not on file  Social Connections: Not on file  Intimate Partner Violence: Not At Risk (04/27/2023)   Humiliation, Afraid, Rape, and Kick questionnaire    Fear of Current or Ex-Partner: No    Emotionally Abused: No    Physically Abused: No    Sexually Abused: No    Family History: Family History  Problem Relation Age of Onset   Heart disease Mother    Cancer Father        liver   Colon cancer  Father 79   Head & neck cancer Father        oral cancer - dx in 43s-50s  Breast cancer Paternal Aunt        dx <50   Brain cancer Brother        possible meningioma   Cancer Maternal Aunt        liver/lung cancer   Stomach cancer Paternal Uncle    Heart disease Maternal Grandmother    Lymphoma Maternal Grandfather        NHL   Heart attack Paternal Grandfather    Cancer Paternal Uncle        NOS    Review of Systems: Review of Systems  Constitutional: Negative.   Respiratory: Negative.    Cardiovascular: Negative.   Gastrointestinal: Negative.   Neurological: Negative.     Physical Exam: Vital Signs BP (!) 155/88 (BP Location: Right Arm, Patient Position: Sitting, Cuff Size: Large)   Pulse 81   Ht 5' 3.5 (1.613 m)   Wt 171 lb 12.8 oz (77.9 kg)   LMP 10/16/2013   SpO2 99%   BMI 29.96 kg/m   Physical Exam Constitutional:      General: Not in acute distress.    Appearance: Normal appearance. Not ill-appearing.  HENT:     Head: Normocephalic and atraumatic.  Eyes:     Pupils: Pupils are equal, round Neck:     Musculoskeletal: Normal range of motion.  Cardiovascular:     Rate and Rhythm: Normal rate    Pulses: Normal pulses.  Pulmonary:     Effort: Pulmonary effort is normal. No respiratory distress.  Abdominal:     General: Abdomen is flat. There is no distension.  Musculoskeletal: Normal range of motion.  Skin:    General: Skin is warm and dry.     Findings: No erythema or rash.  Neurological:     General: No focal deficit present.     Mental Status: Alert and oriented to person, place, and time. Mental status is at baseline.     Motor: No weakness.  Psychiatric:        Mood and Affect: Mood normal.        Behavior: Behavior normal.    Assessment/Plan: The patient is scheduled for fat grafting to bilateral breasts, release of scar contracture of left breast, liposuction of abdomen with Dr. Lowery.  Risks, benefits, and alternatives of procedure  discussed, questions answered and consent obtained.    Smoking Status: Non smoker; Counseling Given?  N/A Last Mammogram: Status post bilateral mastectomy  Caprini Score: 7; Risk Factors include: age, BMI > 25, hx of cancer, and length of planned surgery. Recommendation for mechanical prophylaxis. Encourage early ambulation.   Pictures obtained: @consult   Post-op Rx sent to pharmacy: Oxycodone , Zofran , Keflex   Patient was provided with the General Surgical Risk consent document and Pain Medication Agreement prior to their appointment.  They had adequate time to read through the risk consent documents and Pain Medication Agreement. We also discussed them in person together during this preop appointment. All of their questions were answered to their satisfaction.  Recommended calling if they have any further questions.  Risk consent form and Pain Medication Agreement to be scanned into patient's chart.  The risks that can be encountered with and after liposuction were discussed and include the following but no limited to these:  Asymmetry, fluid accumulation, firmness of the area, fat necrosis with death of fat tissue, bleeding, infection, delayed healing, anesthesia risks, skin sensation changes, injury to structures including nerves, blood vessels, and muscles which may be temporary or permanent, allergies to tape, suture materials and  glues, blood products, topical preparations or injected agents, skin and contour irregularities, skin discoloration and swelling, deep vein thrombosis, cardiac and pulmonary complications, pain, which may persist, persistent pain, recurrence of the lesion, poor healing of the incision, possible need for revisional surgery or staged procedures. Thiere can also be persistent swelling, poor wound healing, rippling or loose skin, worsening of cellulite, swelling, and thermal burn or heat injury from ultrasound with the ultrasound-assisted lipoplasty technique. Any change in  weight fluctuations can alter the outcome.  Reached out to patient's oncology team to discuss holding Verzenio  and Xgeva  prior to surgery  Electronically signed by: Donnice PARAS Dorothy Landgrebe, PA-C 02/25/2024 9:44 AM

## 2024-03-01 ENCOUNTER — Telehealth: Payer: Self-pay | Admitting: Hematology

## 2024-03-01 NOTE — Telephone Encounter (Signed)
 Rescheduled appointments per staff message from Dr.Feng. Talked with the patient and she is aware of the changes made to her upcoming appointments.

## 2024-03-02 ENCOUNTER — Other Ambulatory Visit: Payer: Self-pay

## 2024-03-02 ENCOUNTER — Other Ambulatory Visit: Payer: Self-pay | Admitting: Pharmacy Technician

## 2024-03-02 NOTE — Progress Notes (Signed)
 Specialty Pharmacy Refill Coordination Note  Natasha Graham is a 63 y.o. female contacted today regarding refills of specialty medication(s) Abemaciclib  (VERZENIO )   Patient requested [name] at [facility] [place] date: 03/31/24   Medication will be filled on 03/31/24.

## 2024-03-09 ENCOUNTER — Encounter (HOSPITAL_BASED_OUTPATIENT_CLINIC_OR_DEPARTMENT_OTHER): Payer: Self-pay | Admitting: Plastic Surgery

## 2024-03-09 ENCOUNTER — Other Ambulatory Visit: Payer: Self-pay

## 2024-03-09 ENCOUNTER — Telehealth: Payer: Self-pay

## 2024-03-09 MED ORDER — ONDANSETRON 4 MG PO TBDP
4.0000 mg | ORAL_TABLET | Freq: Three times a day (TID) | ORAL | 0 refills | Status: AC | PRN
Start: 2024-03-09 — End: ?

## 2024-03-09 MED ORDER — OXYCODONE HCL 5 MG PO CAPS: 5.0000 mg | ORAL_CAPSULE | Freq: Four times a day (QID) | ORAL | 0 refills | Status: DC | PRN

## 2024-03-09 MED ORDER — CEPHALEXIN 500 MG PO CAPS: 500.0000 mg | ORAL_CAPSULE | Freq: Four times a day (QID) | ORAL | 0 refills | Status: AC

## 2024-03-09 NOTE — Telephone Encounter (Signed)
 Dawn from [facility] called stating that Oxy-IR is not covered by insurance but oxy 5 mg tablets is.

## 2024-03-13 ENCOUNTER — Encounter (HOSPITAL_BASED_OUTPATIENT_CLINIC_OR_DEPARTMENT_OTHER)
Admission: RE | Admit: 2024-03-13 | Discharge: 2024-03-13 | Disposition: A | Discharge status: Home / Self Care | Source: Ambulatory Visit | Attending: Plastic Surgery

## 2024-03-13 ENCOUNTER — Other Ambulatory Visit: Payer: Self-pay | Admitting: Surgical

## 2024-03-13 DIAGNOSIS — Z01812 Encounter for preprocedural laboratory examination: Secondary | ICD-10-CM | POA: Insufficient documentation

## 2024-03-13 DIAGNOSIS — Z923 Personal history of irradiation: Secondary | ICD-10-CM | POA: Diagnosis not present

## 2024-03-13 DIAGNOSIS — L905 Scar conditions and fibrosis of skin: Secondary | ICD-10-CM | POA: Diagnosis not present

## 2024-03-13 DIAGNOSIS — Z79811 Long term (current) use of aromatase inhibitors: Secondary | ICD-10-CM | POA: Diagnosis not present

## 2024-03-13 DIAGNOSIS — Z170 Estrogen receptor positive status [ER+]: Secondary | ICD-10-CM | POA: Diagnosis not present

## 2024-03-13 DIAGNOSIS — M419 Scoliosis, unspecified: Secondary | ICD-10-CM | POA: Diagnosis not present

## 2024-03-13 DIAGNOSIS — N651 Disproportion of reconstructed breast: Secondary | ICD-10-CM | POA: Diagnosis not present

## 2024-03-13 DIAGNOSIS — C50512 Malignant neoplasm of lower-outer quadrant of left female breast: Secondary | ICD-10-CM | POA: Diagnosis not present

## 2024-03-13 DIAGNOSIS — Z9889 Other specified postprocedural states: Secondary | ICD-10-CM | POA: Diagnosis not present

## 2024-03-13 DIAGNOSIS — Z9013 Acquired absence of bilateral breasts and nipples: Secondary | ICD-10-CM | POA: Diagnosis not present

## 2024-03-13 DIAGNOSIS — Z79899 Other long term (current) drug therapy: Secondary | ICD-10-CM | POA: Diagnosis not present

## 2024-03-13 DIAGNOSIS — I10 Essential (primary) hypertension: Secondary | ICD-10-CM | POA: Diagnosis not present

## 2024-03-13 DIAGNOSIS — I700 Atherosclerosis of aorta: Secondary | ICD-10-CM | POA: Diagnosis not present

## 2024-03-13 LAB — BASIC METABOLIC PANEL WITH GFR
Anion gap: 10 (ref 5–15)
BUN: 9 mg/dL (ref 8–23)
CO2: 27 mmol/L (ref 22–32)
Calcium: 9.1 mg/dL (ref 8.9–10.3)
Chloride: 101 mmol/L (ref 98–111)
Creatinine, Ser: 0.84 mg/dL (ref 0.44–1.00)
GFR, Estimated: >60 mL/min (ref >60)
Glucose, Bld: 99 mg/dL (ref 70–99)
Potassium: 3.5 mmol/L (ref 3.5–5.1)
Sodium: 138 mmol/L (ref 135–145)

## 2024-03-13 MED ORDER — OXYCODONE HCL 5 MG PO TABS: 5.0000 mg | ORAL_TABLET | Freq: Four times a day (QID) | ORAL | 0 refills | Status: AC | PRN

## 2024-03-13 NOTE — Telephone Encounter (Signed)
 Rx resent as Oxy 5mg .

## 2024-03-15 ENCOUNTER — Ambulatory Visit: Admitting: Hematology

## 2024-03-15 ENCOUNTER — Ambulatory Visit

## 2024-03-15 ENCOUNTER — Other Ambulatory Visit

## 2024-03-16 ENCOUNTER — Other Ambulatory Visit: Payer: Self-pay

## 2024-03-16 ENCOUNTER — Encounter (HOSPITAL_BASED_OUTPATIENT_CLINIC_OR_DEPARTMENT_OTHER)
Admission: RE | Disposition: A | Discharge status: Home / Self Care | Payer: Self-pay | Source: Home / Self Care | Attending: Plastic Surgery

## 2024-03-16 ENCOUNTER — Ambulatory Visit (HOSPITAL_BASED_OUTPATIENT_CLINIC_OR_DEPARTMENT_OTHER)
Admission: RE | Admit: 2024-03-16 | Discharge: 2024-03-16 | Disposition: A | Discharge status: Home / Self Care | Attending: Plastic Surgery | Admitting: Plastic Surgery

## 2024-03-16 ENCOUNTER — Encounter (HOSPITAL_BASED_OUTPATIENT_CLINIC_OR_DEPARTMENT_OTHER): Payer: Self-pay | Admitting: Plastic Surgery

## 2024-03-16 ENCOUNTER — Ambulatory Visit (HOSPITAL_BASED_OUTPATIENT_CLINIC_OR_DEPARTMENT_OTHER): Admitting: Anesthesiology

## 2024-03-16 DIAGNOSIS — N651 Disproportion of reconstructed breast: Secondary | ICD-10-CM | POA: Diagnosis not present

## 2024-03-16 DIAGNOSIS — T8544XA Capsular contracture of breast implant, initial encounter: Secondary | ICD-10-CM

## 2024-03-16 DIAGNOSIS — Z79811 Long term (current) use of aromatase inhibitors: Secondary | ICD-10-CM | POA: Diagnosis not present

## 2024-03-16 DIAGNOSIS — Z853 Personal history of malignant neoplasm of breast: Secondary | ICD-10-CM

## 2024-03-16 DIAGNOSIS — Z9889 Other specified postprocedural states: Secondary | ICD-10-CM | POA: Diagnosis not present

## 2024-03-16 DIAGNOSIS — Z9013 Acquired absence of bilateral breasts and nipples: Secondary | ICD-10-CM | POA: Diagnosis not present

## 2024-03-16 DIAGNOSIS — I700 Atherosclerosis of aorta: Secondary | ICD-10-CM | POA: Insufficient documentation

## 2024-03-16 DIAGNOSIS — M419 Scoliosis, unspecified: Secondary | ICD-10-CM | POA: Insufficient documentation

## 2024-03-16 DIAGNOSIS — C50512 Malignant neoplasm of lower-outer quadrant of left female breast: Secondary | ICD-10-CM | POA: Insufficient documentation

## 2024-03-16 DIAGNOSIS — L905 Scar conditions and fibrosis of skin: Secondary | ICD-10-CM | POA: Insufficient documentation

## 2024-03-16 DIAGNOSIS — I10 Essential (primary) hypertension: Secondary | ICD-10-CM | POA: Insufficient documentation

## 2024-03-16 DIAGNOSIS — Z170 Estrogen receptor positive status [ER+]: Secondary | ICD-10-CM | POA: Diagnosis not present

## 2024-03-16 DIAGNOSIS — Z923 Personal history of irradiation: Secondary | ICD-10-CM | POA: Diagnosis not present

## 2024-03-16 DIAGNOSIS — Z79899 Other long term (current) drug therapy: Secondary | ICD-10-CM | POA: Insufficient documentation

## 2024-03-16 DIAGNOSIS — Z01818 Encounter for other preprocedural examination: Secondary | ICD-10-CM

## 2024-03-16 HISTORY — PX: LIPOSUCTION WITH LIPOFILLING: SHX6436

## 2024-03-16 HISTORY — PX: REVISION, RECONSTRUCTION, BREAST: SHX7641

## 2024-03-16 SURGERY — REVISION, RECONSTRUCTION, BREAST
Anesthesia: General | Site: Breast | Laterality: Bilateral

## 2024-03-16 MED ORDER — ONDANSETRON HCL 4 MG/2ML IJ SOLN: 4.0000 mg | Freq: Once | INTRAMUSCULAR | Status: DC | PRN

## 2024-03-16 MED ORDER — ACETAMINOPHEN 10 MG/ML IV SOLN
INTRAVENOUS | Status: AC
Start: 2024-03-16 — End: 2024-03-16
  Filled 2024-03-16: qty 100

## 2024-03-16 MED ORDER — ONDANSETRON HCL 4 MG/2ML IJ SOLN
INTRAMUSCULAR | Status: AC
  Filled 2024-03-16: qty 2

## 2024-03-16 MED ORDER — LACTATED RINGERS IV SOLN: INTRAVENOUS | Status: DC

## 2024-03-16 MED ORDER — SODIUM CHLORIDE 0.9% FLUSH
3.0000 mL | Freq: Two times a day (BID) | INTRAVENOUS | Status: DC
Start: 2024-03-16 — End: 2024-03-16

## 2024-03-16 MED ORDER — DEXAMETHASONE SODIUM PHOSPHATE 10 MG/ML IJ SOLN
INTRAMUSCULAR | Status: AC
  Filled 2024-03-16: qty 1

## 2024-03-16 MED ORDER — MIDAZOLAM HCL 5 MG/5ML IJ SOLN
INTRAMUSCULAR | Status: DC | PRN
  Administered 2024-03-16: 2 mg via INTRAVENOUS

## 2024-03-16 MED ORDER — HYDROMORPHONE HCL 1 MG/ML IJ SOLN
INTRAMUSCULAR | Status: DC | PRN
  Administered 2024-03-16: .5 mg via INTRAVENOUS

## 2024-03-16 MED ORDER — FENTANYL CITRATE (PF) 100 MCG/2ML IJ SOLN
INTRAMUSCULAR | Status: AC
  Filled 2024-03-16: qty 2

## 2024-03-16 MED ORDER — HYDROMORPHONE HCL 1 MG/ML IJ SOLN: 0.2500 mg | INTRAMUSCULAR | Status: DC | PRN

## 2024-03-16 MED ORDER — 0.9 % SODIUM CHLORIDE (POUR BTL) OPTIME
TOPICAL | Status: DC | PRN
  Administered 2024-03-16: 1000 mL

## 2024-03-16 MED ORDER — OXYCODONE HCL 5 MG/5ML PO SOLN: 5.0000 mg | Freq: Once | ORAL | Status: AC | PRN

## 2024-03-16 MED ORDER — FENTANYL CITRATE (PF) 100 MCG/2ML IJ SOLN
INTRAMUSCULAR | Status: DC | PRN
  Administered 2024-03-16: 50 ug via INTRAVENOUS
  Administered 2024-03-16: 100 ug via INTRAVENOUS
  Administered 2024-03-16: 50 ug via INTRAVENOUS

## 2024-03-16 MED ORDER — ATROPINE SULFATE 0.4 MG/ML IV SOLN
INTRAVENOUS | Status: AC
  Filled 2024-03-16: qty 1

## 2024-03-16 MED ORDER — OXYCODONE HCL 5 MG PO TABS
5.0000 mg | ORAL_TABLET | Freq: Once | ORAL | Status: AC | PRN
  Administered 2024-03-16: 5 mg via ORAL

## 2024-03-16 MED ORDER — PROPOFOL 10 MG/ML IV BOLUS
INTRAVENOUS | Status: DC | PRN
Start: 2024-03-16 — End: 2024-03-16
  Administered 2024-03-16: 200 mg via INTRAVENOUS

## 2024-03-16 MED ORDER — SCOPOLAMINE 1 MG/3DAYS TD PT72
1.0000 | MEDICATED_PATCH | TRANSDERMAL | Status: DC
  Administered 2024-03-16: 1.5 mg via TRANSDERMAL

## 2024-03-16 MED ORDER — LIDOCAINE HCL 1 % IJ SOLN
INTRAVENOUS | Status: DC | PRN
  Administered 2024-03-16: 600 mL

## 2024-03-16 MED ORDER — LACTATED RINGERS IV SOLN
INTRAVENOUS | Status: AC | PRN
  Administered 2024-03-16 (×2): 1000 mL

## 2024-03-16 MED ORDER — SUCCINYLCHOLINE CHLORIDE 200 MG/10ML IV SOSY
PREFILLED_SYRINGE | INTRAVENOUS | Status: AC
  Filled 2024-03-16: qty 10

## 2024-03-16 MED ORDER — FENTANYL CITRATE (PF) 100 MCG/2ML IJ SOLN
25.0000 ug | INTRAMUSCULAR | Status: DC | PRN
  Administered 2024-03-16: 50 ug via INTRAVENOUS

## 2024-03-16 MED ORDER — ACETAMINOPHEN 325 MG PO TABS: 650.0000 mg | ORAL_TABLET | ORAL | Status: DC | PRN

## 2024-03-16 MED ORDER — MIDAZOLAM HCL 2 MG/2ML IJ SOLN
INTRAMUSCULAR | Status: AC
  Filled 2024-03-16: qty 2

## 2024-03-16 MED ORDER — PHENYLEPHRINE HCL (PRESSORS) 10 MG/ML IV SOLN
INTRAVENOUS | Status: DC | PRN
Start: 2024-03-16 — End: 2024-03-16
  Administered 2024-03-16: 80 ug via INTRAVENOUS

## 2024-03-16 MED ORDER — DEXAMETHASONE SODIUM PHOSPHATE 4 MG/ML IJ SOLN
INTRAMUSCULAR | Status: DC | PRN
  Administered 2024-03-16: 5 mg via INTRAVENOUS

## 2024-03-16 MED ORDER — BUPIVACAINE LIPOSOME 1.3 % IJ SUSP
INTRAMUSCULAR | Status: DC | PRN
  Administered 2024-03-16: 10 mL

## 2024-03-16 MED ORDER — AMISULPRIDE (ANTIEMETIC) 5 MG/2ML IV SOLN: 10.0000 mg | Freq: Once | INTRAVENOUS | Status: DC | PRN

## 2024-03-16 MED ORDER — BUPIVACAINE LIPOSOME 1.3 % IJ SUSP
INTRAMUSCULAR | Status: AC
Start: 2024-03-16 — End: 2024-03-16
  Filled 2024-03-16: qty 20

## 2024-03-16 MED ORDER — CHLORHEXIDINE GLUCONATE CLOTH 2 % EX PADS: 6.0000 | MEDICATED_PAD | Freq: Once | CUTANEOUS | Status: DC

## 2024-03-16 MED ORDER — SCOPOLAMINE 1 MG/3DAYS TD PT72
MEDICATED_PATCH | TRANSDERMAL | Status: AC
  Filled 2024-03-16: qty 1

## 2024-03-16 MED ORDER — CEFAZOLIN SODIUM-DEXTROSE 2-4 GM/100ML-% IV SOLN
2.0000 g | INTRAVENOUS | Status: AC
  Administered 2024-03-16: 2 g via INTRAVENOUS

## 2024-03-16 MED ORDER — LIDOCAINE 2% (20 MG/ML) 5 ML SYRINGE
INTRAMUSCULAR | Status: DC | PRN
  Administered 2024-03-16: 50 mg via INTRAVENOUS

## 2024-03-16 MED ORDER — OXYCODONE HCL 5 MG PO TABS
ORAL_TABLET | ORAL | Status: AC
  Filled 2024-03-16: qty 1

## 2024-03-16 MED ORDER — HYDROMORPHONE HCL 1 MG/ML IJ SOLN
INTRAMUSCULAR | Status: AC
Start: 2024-03-16 — End: 2024-03-16
  Filled 2024-03-16: qty 0.5

## 2024-03-16 MED ORDER — SODIUM CHLORIDE 0.9 % IV SOLN: 250.0000 mL | INTRAVENOUS | Status: DC | PRN

## 2024-03-16 MED ORDER — PHENYLEPHRINE 80 MCG/ML (10ML) SYRINGE FOR IV PUSH (FOR BLOOD PRESSURE SUPPORT)
PREFILLED_SYRINGE | INTRAVENOUS | Status: AC
  Filled 2024-03-16: qty 10

## 2024-03-16 MED ORDER — ONDANSETRON HCL 4 MG/2ML IJ SOLN
INTRAMUSCULAR | Status: DC | PRN
  Administered 2024-03-16: 4 mg via INTRAVENOUS

## 2024-03-16 MED ORDER — LIDOCAINE-EPINEPHRINE 1 %-1:100000 IJ SOLN
INTRAMUSCULAR | Status: DC | PRN
  Administered 2024-03-16: 7 mL

## 2024-03-16 MED ORDER — LIDOCAINE 2% (20 MG/ML) 5 ML SYRINGE
INTRAMUSCULAR | Status: AC
  Filled 2024-03-16: qty 5

## 2024-03-16 MED ORDER — OXYCODONE HCL 5 MG PO TABS: 5.0000 mg | ORAL_TABLET | ORAL | Status: DC | PRN

## 2024-03-16 MED ORDER — BUPIVACAINE LIPOSOME 1.3 % IJ SUSP
INTRAMUSCULAR | Status: AC
  Filled 2024-03-16: qty 10

## 2024-03-16 MED ORDER — ACETAMINOPHEN 10 MG/ML IV SOLN
INTRAVENOUS | Status: DC | PRN
  Administered 2024-03-16: 1000 mg via INTRAVENOUS

## 2024-03-16 MED ORDER — EPHEDRINE 5 MG/ML INJ
INTRAVENOUS | Status: AC
  Filled 2024-03-16: qty 5

## 2024-03-16 MED ORDER — SODIUM CHLORIDE 0.9% FLUSH
3.0000 mL | INTRAVENOUS | Status: DC | PRN
Start: 2024-03-16 — End: 2024-03-16

## 2024-03-16 MED ORDER — ACETAMINOPHEN 325 MG RE SUPP: 650.0000 mg | RECTAL | Status: DC | PRN

## 2024-03-16 SURGICAL SUPPLY — 48 items
BINDER ABDOMINAL 10 UNV 27-48 (MISCELLANEOUS) IMPLANT
BINDER ABDOMINAL 12 SM 30-45 (SOFTGOODS) IMPLANT
BINDER ABDOMINAL 9 SM 30-45 (SOFTGOODS) IMPLANT
BINDER BREAST LRG (GAUZE/BANDAGES/DRESSINGS) IMPLANT
BINDER BREAST MEDIUM (GAUZE/BANDAGES/DRESSINGS) IMPLANT
BINDER BREAST XLRG (GAUZE/BANDAGES/DRESSINGS) IMPLANT
BINDER BREAST XXLRG (GAUZE/BANDAGES/DRESSINGS) IMPLANT
BLADE HEX COATED 2.75 (ELECTRODE) IMPLANT
BLADE SURG 15 STRL LF DISP TIS (BLADE) ×1 IMPLANT
COVER BACK TABLE 60X90IN (DRAPES) ×1 IMPLANT
COVER MAYO STAND STRL (DRAPES) ×1 IMPLANT
DERMABOND ADVANCED .7 DNX12 (GAUZE/BANDAGES/DRESSINGS) ×1 IMPLANT
DRAPE LAPAROSCOPIC ABDOMINAL (DRAPES) ×1 IMPLANT
DRAPE UTILITY XL STRL (DRAPES) ×1 IMPLANT
DRESSING MEPILEX FLEX 4X4 (GAUZE/BANDAGES/DRESSINGS) IMPLANT
ELECTRODE REM PT RTRN 9FT ADLT (ELECTROSURGICAL) ×1 IMPLANT
EXTRACTOR CANIST REVOLVE STRL (CANNISTER) ×1 IMPLANT
GAUZE PAD ABD 8X10 STRL (GAUZE/BANDAGES/DRESSINGS) ×2 IMPLANT
GLOVE BIO SURGEON STRL SZ 6.5 (GLOVE) ×2 IMPLANT
GLOVE BIO SURGEON STRL SZ7.5 (GLOVE) IMPLANT
GLOVE BIOGEL PI IND STRL 7.0 (GLOVE) IMPLANT
GLOVE BIOGEL PI IND STRL 8 (GLOVE) IMPLANT
GOWN STRL REUS W/ TWL LRG LVL3 (GOWN DISPOSABLE) ×2 IMPLANT
GOWN STRL REUS W/ TWL XL LVL3 (GOWN DISPOSABLE) IMPLANT
IV LACTATED RINGERS 1000ML (IV SOLUTION) ×2 IMPLANT
LINER CANISTER 1000CC FLEX (MISCELLANEOUS) ×1 IMPLANT
NDL HYPO 25X1 1.5 SAFETY (NEEDLE) ×1 IMPLANT
NEEDLE HYPO 25X1 1.5 SAFETY (NEEDLE) ×2 IMPLANT
PACK BASIN DAY SURGERY FS (CUSTOM PROCEDURE TRAY) ×1 IMPLANT
PAD ALCOHOL SWAB (MISCELLANEOUS) ×1 IMPLANT
PAD FOAM SILICONE BACKED (GAUZE/BANDAGES/DRESSINGS) ×1 IMPLANT
PENCIL SMOKE EVACUATOR (MISCELLANEOUS) IMPLANT
SLEEVE SCD COMPRESS KNEE MED (STOCKING) ×1 IMPLANT
SPIKE FLUID TRANSFER (MISCELLANEOUS) IMPLANT
SPONGE T-LAP 18X18 ~~LOC~~+RFID (SPONGE) ×1 IMPLANT
STRIP SUTURE WOUND CLOSURE 1/2 (MISCELLANEOUS) ×1 IMPLANT
SUT MNCRL AB 4-0 PS2 18 (SUTURE) IMPLANT
SUT MON AB 5-0 PS2 18 (SUTURE) ×2 IMPLANT
SYR 10ML LL (SYRINGE) ×5 IMPLANT
SYR 3ML 18GX1 1/2 (SYRINGE) IMPLANT
SYR 50ML LL SCALE MARK (SYRINGE) ×1 IMPLANT
SYR CONTROL 10ML LL (SYRINGE) ×1 IMPLANT
SYR TOOMEY 50ML (SYRINGE) IMPLANT
TOWEL GREEN STERILE FF (TOWEL DISPOSABLE) ×2 IMPLANT
TRAY DSU PREP LF (CUSTOM PROCEDURE TRAY) ×1 IMPLANT
TUBING INFILTRATION IT-10001 (TUBING) ×1 IMPLANT
TUBING SET GRADUATE ASPIR 12FT (MISCELLANEOUS) ×1 IMPLANT
UNDERPAD 30X36 HEAVY ABSORB (UNDERPADS AND DIAPERS) ×2 IMPLANT

## 2024-03-16 NOTE — Anesthesia Postprocedure Evaluation (Signed)
 Anesthesia Post Note  Patient: Natasha Graham  Procedure(s) Performed: REVISION, RECONSTRUCTION, BREAST (Bilateral: Breast) LIPOSUCTION, WITH FAT TRANSFER (Bilateral: Breast)     Patient location during evaluation: PACU Anesthesia Type: General Level of consciousness: awake and alert Pain management: pain level controlled Vital Signs Assessment: post-procedure vital signs reviewed and stable Respiratory status: spontaneous breathing, nonlabored ventilation, respiratory function stable and patient connected to nasal cannula oxygen Cardiovascular status: blood pressure returned to baseline and stable Postop Assessment: no apparent nausea or vomiting Anesthetic complications: no   No notable events documented.  Last Vitals:  Vitals:   03/16/24 1115 03/16/24 1130  BP: (!) 168/68 (!) 152/78  Pulse: 82 71  Resp: 15 13  Temp:    SpO2: 100% 100%    Last Pain:  Vitals:   03/16/24 1130  TempSrc:   PainSc: 6                  Jaley Yan L Taariq Leitz

## 2024-03-16 NOTE — Interval H&P Note (Signed)
 History and Physical Interval Note:  03/16/2024 7:26 AM  Natasha Graham  has presented today for surgery, with the diagnosis of breast asymmetry following reconstructive surgery.  The various methods of treatment have been discussed with the patient and family. After consideration of risks, benefits and other options for treatment, the patient has consented to  Procedure(s) with comments: REVISION, RECONSTRUCTION, BREAST (Bilateral) - Fat grafting bilateral breasts with release of scar contracture left breast LIPOSUCTION, WITH FAT TRANSFER (Bilateral) as a surgical intervention.  The patient's history has been reviewed, patient examined, no change in status, stable for surgery.  I have reviewed the patient's chart and labs.  Questions were answered to the patient's satisfaction.     [name]

## 2024-03-16 NOTE — Transfer of Care (Signed)
 Immediate Anesthesia Transfer of Care Note  Patient: Natasha Graham  Procedure(s) Performed: REVISION, RECONSTRUCTION, BREAST (Bilateral: Breast) LIPOSUCTION, WITH FAT TRANSFER (Bilateral: Breast)  Patient Location: PACU  Anesthesia Type:General  Level of Consciousness: awake, alert , oriented, drowsy, and patient cooperative  Airway & Oxygen Therapy: Patient Spontanous Breathing and Patient connected to face mask oxygen  Post-op Assessment: Report given to RN and Post -op Vital signs reviewed and stable  Post vital signs: Reviewed and stable  Last Vitals:  Vitals Value Taken Time  BP 161/95 03/16/24 11:04  Temp    Pulse 89 03/16/24 11:05  Resp 13 03/16/24 11:05  SpO2 100 % 03/16/24 11:05  Vitals shown include unfiled device data.  Last Pain:  Vitals:   03/16/24 0716  TempSrc: Temporal  PainSc: 0-No pain      Patients Stated Pain Goal: 3 (03/16/24 0716)  Complications: No notable events documented.

## 2024-03-16 NOTE — Discharge Instructions (Addendum)
 INSTRUCTIONS FOR AFTER BREAST SURGERY   You will likely have some questions about what to expect following your operation.  The following information will help you and your family understand what to expect when you are discharged from the hospital.  It is important to follow these guidelines to help ensure a smooth recovery and reduce complication.  Postoperative instructions include information on: diet, wound care, medications and physical activity.  AFTER SURGERY Expect to go home after the procedure.  In some cases, you may need to spend one night in the hospital for observation.  DIET Breast surgery does not require a specific diet.  However, the healthier you eat the better your body will heal. It is important to increasing your protein intake.  This means limiting the foods with sugar and carbohydrates.  Focus on vegetables and some meat.  If you have liposuction during your procedure be sure to drink water .  If your urine is bright yellow, then it is concentrated, and you need to drink more water .  As a general rule after surgery, you should have 8 ounces of water  every hour while awake.  If you find you are persistently nauseated or unable to take in liquids let us  know.  NO TOBACCO USE or EXPOSURE.  This will slow your healing process and lead to a wound.  WOUND CARE Leave the binder on for 3 days . Use fragrance free soap like Dial, Dove or [place].   After 3 days you can remove the binder to shower. Once dry apply binder or sports bra. If you have liposuction you will have a soft and spongy dressing (Lipofoam) that helps prevent creases in your skin.  Remove before you shower and then replace it.  It is also available on [employer]. If you have steri-strips / tape directly attached to your skin leave them in place. It is OK to get these wet.   No baths, pools or hot tubs for four weeks. We close your incision to leave the smallest and best-looking scar. No ointment or creams on your incisions  for four weeks.  No Neosporin (Too many skin reactions).  A few weeks after surgery you can use Mederma and start massaging the scar. We ask you to wear your binder or sports bra for the first 6 weeks around the clock, including while sleeping. This provides added comfort and helps reduce the fluid accumulation at the surgery site. NO Ice or heating pads to the operative site.  You have a very high risk of a BURN before you feel the temperature change.  ACTIVITY No heavy lifting until cleared by the doctor.  This usually means no more than a half-gallon of milk.  It is OK to walk and climb stairs. Moving your legs is very important to decrease your risk of a blood clot.  It will also help keep you from getting deconditioned.  Every 1 to 2 hours get up and walk for 5 minutes. This will help with a quicker recovery back to normal.  Let pain be your guide so you don't do too much.  This time is for you to recover.  You will be more comfortable if you sleep and rest with your head elevated either with a few pillows under you or in a recliner.  No stomach sleeping for a three months.  WORK Everyone returns to work at different times. As a rough guide, most people take at least 1 - 2 weeks off prior to returning to work. If  you need documentation for your job, give the forms to the front staff at the clinic.  DRIVING Arrange for someone to bring you home from the hospital after your surgery.  You may be able to drive a few days after surgery but not while taking any narcotics or valium .  BOWEL MOVEMENTS Constipation can occur after anesthesia and while taking pain medication.  It is important to stay ahead for your comfort.  We recommend taking Milk of Magnesia (2 tablespoons; twice a day) while taking the pain pills.  MEDICATIONS You may be prescribed should start after surgery At your preoperative visit for you history and physical you may have been given the following medications: An antibiotic: Start  this medication when you get home and take according to the instructions on the bottle. Zofran  4 mg:  This is to treat nausea and vomiting.  You can take this every 6 hours as needed and only if needed. Valium  2 mg for breast cancer patients: This is for muscle tightness if you have an implant or expander. This will help relax your muscle which also helps with pain control.  This can be taken every 12 hours as needed. Don't drive after taking this medication. Norco (hydrocodone /acetaminophen ) 5/325 mg:  This is only to be used after you have taken the Motrin  or the Tylenol . Every 8 hours as needed.   Over the counter Medication to take: Ibuprofen  (Motrin ) 600 mg:  Take this every 6 hours.  If you have additional pain then take 500 mg of the Tylenol  every 8 hours.  Only take the Norco after you have tried these two. MiraLAX or Milk of Magnesia: Take this according to the bottle if you take the Norco.  WHEN TO CALL Call your surgeon's office if any of the following occur: Fever 101 degrees F or greater Excessive bleeding or fluid from the incision site. Pain that increases over time without aid from the medications Redness, warmth, or pus draining from incision sites Persistent nausea or inability to take in liquids Severe misshapen area that underwent the operation.   Post Anesthesia Home Care Instructions  Activity: Get plenty of rest for the remainder of the day. A responsible individual must stay with you for 24 hours following the procedure.  For the next 24 hours, DO NOT: -Drive a car -Advertising copywriter -Drink alcoholic beverages -Take any medication unless instructed by your physician -Make any legal decisions or sign important papers.  Meals: Start with liquid foods such as gelatin or soup. Progress to regular foods as tolerated. Avoid greasy, spicy, heavy foods. If nausea and/or vomiting occur, drink only clear liquids until the nausea and/or vomiting subsides. Call your  physician if vomiting continues.  Special Instructions/Symptoms: Your throat may feel dry or sore from the anesthesia or the breathing tube placed in your throat during surgery. If this causes discomfort, gargle with warm salt water . The discomfort should disappear within 24 hours.  If you had a scopolamine  patch placed behind your ear for the management of post- operative nausea and/or vomiting:  1. The medication in the patch is effective for 72 hours, after which it should be removed.  Wrap patch in a tissue and discard in the trash. Wash hands thoroughly with soap and water . 2. You may remove the patch earlier than 72 hours if you experience unpleasant side effects which may include dry mouth, dizziness or visual disturbances. 3. Avoid touching the patch. Wash your hands with soap and water  after contact with the  patch.    Next dose of tylenol  at 4:30pm

## 2024-03-16 NOTE — Anesthesia Preprocedure Evaluation (Addendum)
 Anesthesia Evaluation  Patient identified by MRN, date of birth, ID band Patient awake    Reviewed: Allergy & Precautions, H&P , NPO status , Patient's Chart, lab work & pertinent test results  History of Anesthesia Complications (+) PONV and history of anesthetic complications (does well w/ scop patch)  Airway Mallampati: III  TM Distance: >3 FB Neck ROM: Full    Dental  (+) Teeth Intact, Dental Advisory Given   Pulmonary  Snores at night, no sleep study    Pulmonary exam normal breath sounds clear to auscultation       Cardiovascular hypertension (164/84 preop, per pt normally under SBP 140 at home), Pt. on medications Normal cardiovascular exam Rhythm:Regular Rate:Normal     Neuro/Psych negative neurological ROS  negative psych ROS   GI/Hepatic negative GI ROS, Neg liver ROS,,,  Endo/Other  negative endocrine ROS    Renal/GU negative Renal ROS  negative genitourinary   Musculoskeletal negative musculoskeletal ROS (+)    Abdominal   Peds negative pediatric ROS (+)  Hematology negative hematology ROS (+)   Anesthesia Other Findings Breast ca   Reproductive/Obstetrics negative OB ROS                              Anesthesia Physical Anesthesia Plan  ASA: 2  Anesthesia Plan: General   Post-op Pain Management: Tylenol  PO (pre-op)* and Dilaudid  IV   Induction: Intravenous  PONV Risk Score and Plan: 4 or greater and Ondansetron , Dexamethasone , Midazolam , Treatment may vary due to age or medical condition and Scopolamine  patch - Pre-op  Airway Management Planned: LMA  Additional Equipment: None  Intra-op Plan:   Post-operative Plan: Extubation in OR  Informed Consent: I have reviewed the patients History and Physical, chart, labs and discussed the procedure including the risks, benefits and alternatives for the proposed anesthesia with the patient or authorized representative  who has indicated his/her understanding and acceptance.     Dental advisory given  Plan Discussed with: CRNA  Anesthesia Plan Comments:          Anesthesia Quick Evaluation

## 2024-03-16 NOTE — Anesthesia Procedure Notes (Signed)
 Procedure Name: LMA Insertion Date/Time: 03/16/2024 9:51 AM  Performed by: Emilio Rock BIRCH, CRNAPre-anesthesia Checklist: Patient identified, Emergency Drugs available, Suction available and Patient being monitored Patient Re-evaluated:Patient Re-evaluated prior to induction Oxygen Delivery Method: Circle System Utilized Preoxygenation: Pre-oxygenation with 100% oxygen Induction Type: IV induction Ventilation: Mask ventilation without difficulty LMA: LMA inserted LMA Size: 4.0 Number of attempts: 1 Airway Equipment and Method: bite block Placement Confirmation: positive ETCO2 Tube secured with: Tape Dental Injury: Teeth and Oropharynx as per pre-operative assessment

## 2024-03-16 NOTE — Op Note (Signed)
 DATE OF OPERATION: 03/16/2024  LOCATION: [facility]  PREOPERATIVE DIAGNOSIS: Bilateral breast asymmetry after breast cancer treatment with capsule contracture  POSTOPERATIVE DIAGNOSIS: Same  PROCEDURE:  Release of left lateral breast contracture 50 cm2 Release of right medial great contracture 100 cm2 Adipose grafting to right breast 60 cc Adipose grafting to left breast 30 cc  SURGEON: Estefana Fritter, DO  ASSISTANT: Matt Scheeler, PA  EBL: none  CONDITION: Stable  COMPLICATIONS: None  INDICATION: The patient, Natasha Graham, is a 63 y.o. female born on 1960-11-25, is here for treatment of breast asymmetry after reconstruction for breast cancer.   PROCEDURE DETAILS:  The patient was seen prior to surgery and marked.  The IV antibiotics were given. The patient was taken to the operating room and given a general anesthetic. A standard time out was performed and all information was confirmed by those in the room. SCDs were placed.   The abdomen and breast were prepped and draped.  Local was injected into the incision sites.  The #15 blade was used to make an incision at the lower abdominal area.  The tumescent was placed.  Liposuction was then done with harvest into the Revolve. The adipose was prepared according to the manufacture guidelines.  The right breast incision was made medially.  The forked blade was used to release 100 cm2 of contracture.  The adipose was then placed in the plan superficial to the ADM.   The #15 blade was sued to make an incision on the lateral left breast.  The forked knife was used to release 50 cm2 of contracture.  The adipose was then injected and we were able to place 50 cc. All incisions were closed with the 5-0 Monocryl. The patient was allowed to wake up and taken to recovery room in stable condition at the end of the case. The family was notified at the end of the case.   The advanced practice practitioner (APP) assisted throughout  the case.  The APP was essential in retraction and counter traction when needed to make the case progress smoothly.  This retraction and assistance made it possible to see the tissue plans for the procedure.  The assistance was needed for blood control, tissue re-approximation and assisted with closure of the incision site.

## 2024-03-17 ENCOUNTER — Encounter (HOSPITAL_BASED_OUTPATIENT_CLINIC_OR_DEPARTMENT_OTHER): Payer: Self-pay | Admitting: Plastic Surgery

## 2024-03-22 ENCOUNTER — Encounter: Payer: Self-pay | Admitting: Surgical

## 2024-03-22 ENCOUNTER — Ambulatory Visit: Admitting: Surgical

## 2024-03-22 VITALS — BP 161/81 | HR 89 | Ht 60.5 in | Wt 171.8 lb

## 2024-03-22 DIAGNOSIS — Z9012 Acquired absence of left breast and nipple: Secondary | ICD-10-CM

## 2024-03-22 DIAGNOSIS — C50512 Malignant neoplasm of lower-outer quadrant of left female breast: Secondary | ICD-10-CM

## 2024-03-22 DIAGNOSIS — N651 Disproportion of reconstructed breast: Secondary | ICD-10-CM

## 2024-03-22 DIAGNOSIS — Z9889 Other specified postprocedural states: Secondary | ICD-10-CM

## 2024-03-22 DIAGNOSIS — Z170 Estrogen receptor positive status [ER+]: Secondary | ICD-10-CM

## 2024-03-22 NOTE — Progress Notes (Signed)
 63 year old female here for follow-up after fat grafting to bilateral breasts and liposuction of abdomen on 03/16/2024 with Dr. Lowery.  She is 1 week postop.  She reports she is overall doing well, she does not have any specific concerns about recovery.  Pain has been well-controlled.  She has been having normal bowel movements and urinating normally.  Does not report any infectious symptoms.  She does have some ecchymosis of bilateral breast and abdomen.  She feels as if it has been improving.  Chaperone present on exam On exam ecchymosis noted of right medial breast and left lateral breast, mild irritation of abdomen noted from TOPA foam pads.  No significant swelling is noted of abdomen.  She does have some swelling of bilateral breast.  No open wounds are noted.  Steri-Strips are in place.  A/P:  Discussed expected recovery from fat grafting procedure with patient.  All of her questions were answered to her content.  We will plan to see her in 2 weeks for reevaluation.  We discussed flipping around the TOPA foam pads to avoid irritation from the adhesive portion.  If she continues to have issues with the TOPI foam, can remove and only wear compressive garment on abdomen.  No signs of infection or concern on exam.  Call with questions or concerns.

## 2024-03-25 ENCOUNTER — Other Ambulatory Visit: Payer: Self-pay | Admitting: Hematology

## 2024-03-28 NOTE — Assessment & Plan Note (Addendum)
 Metastatic Breast Cancer to bones, lobular carcinoma, ER+/PR+, HER2 IHC 2+ (low expression) -left breast cancer initially diagnosed 07/12/19. S/p left mastectomy on 09/27/19 showed stage IIIA p(T3, N1) ILC, SLND on 11/13/19 was negative (0/7). Mammaprint was low risk. -she received postmastectomy radiation 12/26/19 - 02/06/20 and took tamoxifen  02/29/20 - 07/2020 -genetics 06/27/20 were negative. -she was found to have bony metastatic disease in 07/2020, confirmed with bone biopsy 08/06/20. -she began Xgeva  and fulvestrant  on 08/15/20 and palbociclib  on 09/12/20. She tolerates well overall. -[employer] testing obtained on her 07/2020 biopsy showed no targetable mutation.  -PET scan on 12/22/2022 showed treated diffuse bone mets, no hypermetabolic uptake  -Due to high co-pay, Ibrance  was switched to Verzenio  in February 2024, she is tolerating well with low-dose 100 mg twice daily, with occasional diarrhea. -continue Xgeva  every 3 months  - She underwent bilateral reconstruction revision surgery in July 2025.

## 2024-03-29 ENCOUNTER — Other Ambulatory Visit (HOSPITAL_COMMUNITY): Payer: Self-pay

## 2024-03-29 ENCOUNTER — Inpatient Hospital Stay: Attending: Hematology

## 2024-03-29 ENCOUNTER — Inpatient Hospital Stay

## 2024-03-29 ENCOUNTER — Inpatient Hospital Stay (HOSPITAL_BASED_OUTPATIENT_CLINIC_OR_DEPARTMENT_OTHER): Admitting: Hematology

## 2024-03-29 ENCOUNTER — Other Ambulatory Visit: Payer: Self-pay

## 2024-03-29 VITALS — BP 138/70 | HR 99 | Temp 97.6°F | Resp 19 | Ht 60.5 in | Wt 170.6 lb

## 2024-03-29 DIAGNOSIS — Z170 Estrogen receptor positive status [ER+]: Secondary | ICD-10-CM | POA: Insufficient documentation

## 2024-03-29 DIAGNOSIS — C50512 Malignant neoplasm of lower-outer quadrant of left female breast: Secondary | ICD-10-CM | POA: Diagnosis not present

## 2024-03-29 DIAGNOSIS — C7951 Secondary malignant neoplasm of bone: Secondary | ICD-10-CM | POA: Insufficient documentation

## 2024-03-29 DIAGNOSIS — Z1721 Progesterone receptor positive status: Secondary | ICD-10-CM | POA: Diagnosis not present

## 2024-03-29 DIAGNOSIS — Z79811 Long term (current) use of aromatase inhibitors: Secondary | ICD-10-CM | POA: Diagnosis not present

## 2024-03-29 LAB — CMP (CANCER CENTER ONLY)
ALT: 26 U/L (ref 0–44)
AST: 20 U/L (ref 15–41)
Albumin: 4.1 g/dL (ref 3.5–5.0)
Alkaline Phosphatase: 55 U/L (ref 38–126)
Anion gap: 6 (ref 5–15)
BUN: 12 mg/dL (ref 8–23)
CO2: 32 mmol/L (ref 22–32)
Calcium: 9.5 mg/dL (ref 8.9–10.3)
Chloride: 102 mmol/L (ref 98–111)
Creatinine: 0.84 mg/dL (ref 0.44–1.00)
GFR, Estimated: >60 mL/min (ref >60)
Glucose, Bld: 113 mg/dL — ABNORMAL HIGH (ref 70–99)
Potassium: 3.3 mmol/L — ABNORMAL LOW (ref 3.5–5.1)
Sodium: 140 mmol/L (ref 135–145)
Total Bilirubin: 0.5 mg/dL (ref 0.0–1.2)
Total Protein: 7.1 g/dL (ref 6.5–8.1)

## 2024-03-29 LAB — CBC WITH DIFFERENTIAL (CANCER CENTER ONLY)
Abs Immature Granulocytes: 0.05 K/uL (ref 0.00–0.07)
Basophils Absolute: 0.1 K/uL (ref 0.0–0.1)
Basophils Relative: 1 %
Eosinophils Absolute: 0.1 K/uL (ref 0.0–0.5)
Eosinophils Relative: 2 %
HCT: 34.8 % — ABNORMAL LOW (ref 36.0–46.0)
Hemoglobin: 12.6 g/dL (ref 12.0–15.0)
Immature Granulocytes: 1 %
Lymphocytes Relative: 23 %
Lymphs Abs: 1.4 K/uL (ref 0.7–4.0)
MCH: 35.7 pg — ABNORMAL HIGH (ref 26.0–34.0)
MCHC: 36.2 g/dL — ABNORMAL HIGH (ref 30.0–36.0)
MCV: 98.6 fL (ref 80.0–100.0)
Monocytes Absolute: 0.5 K/uL (ref 0.1–1.0)
Monocytes Relative: 8 %
Neutro Abs: 3.8 K/uL (ref 1.7–7.7)
Neutrophils Relative %: 65 %
Platelet Count: 283 K/uL (ref 150–400)
RBC: 3.53 MIL/uL — ABNORMAL LOW (ref 3.87–5.11)
RDW: 11.6 % (ref 11.5–15.5)
WBC Count: 5.9 K/uL (ref 4.0–10.5)
nRBC: 0 % (ref 0.0–0.2)

## 2024-03-29 MED ORDER — DENOSUMAB 120 MG/1.7ML ~~LOC~~ SOLN
120.0000 mg | Freq: Once | SUBCUTANEOUS | Status: AC
Start: 2024-03-29 — End: 2024-03-29
  Administered 2024-03-29: 120 mg via SUBCUTANEOUS
  Filled 2024-03-29: qty 1.7

## 2024-03-29 MED ORDER — ANASTROZOLE 1 MG PO TABS
1.0000 mg | ORAL_TABLET | Freq: Every day | ORAL | 3 refills | Status: DC
  Filled 2024-03-29: qty 90, 90d supply, fill #0

## 2024-03-29 MED ORDER — ABEMACICLIB 100 MG PO TABS
100.0000 mg | ORAL_TABLET | Freq: Two times a day (BID) | ORAL | 2 refills | Status: DC
  Filled 2024-03-29: qty 56, 28d supply, fill #0
  Filled 2024-04-25: qty 56, 28d supply, fill #1
  Filled 2024-05-24: qty 56, 28d supply, fill #2

## 2024-03-29 MED ORDER — FULVESTRANT 250 MG/5ML IM SOSY: 500.0000 mg | PREFILLED_SYRINGE | Freq: Once | INTRAMUSCULAR | Status: DC

## 2024-03-29 NOTE — Progress Notes (Signed)
 [facility]   [phone] [phone]   Clinic Follow up Note   Patient Care Team: Hamrick, Charlene CROME, MD as PCP - General (Family Medicine) Ebbie Cough, MD as Consulting Physician (General Surgery) Glean Stephane BROCKS, RN (Inactive) as Registered Nurse Tyree Nanetta SAILOR, RN as Registered Nurse Dillingham, Estefana RAMAN, DO as Attending Physician (Plastic Surgery) Shannon Agent, MD as Consulting Physician (Radiation Oncology) Gorge Ade, MD as Consulting Physician (Obstetrics and Gynecology) Lucila Norleen LABOR, RPH-CPP (Pharmacist) Lanny Callander, MD as Consulting Physician (Medical Oncology)  Date of Service:  03/29/2024  CHIEF COMPLAINT: f/u of metastatic breast cancer  CURRENT THERAPY:  Anastrozole  and Verzenio  Xgeva  every 3 months  Oncology History   Malignant neoplasm metastatic to bone [facility]) Metastatic Breast Cancer to bones, lobular carcinoma, ER+/PR+, HER2 IHC 2+ (low expression) -left breast cancer initially diagnosed 07/12/19. S/p left mastectomy on 09/27/19 showed stage IIIA p(T3, N1) ILC, SLND on 11/13/19 was negative (0/7). Mammaprint was low risk. -she received postmastectomy radiation 12/26/19 - 02/06/20 and took tamoxifen  02/29/20 - 07/2020 -genetics 06/27/20 were negative. -she was found to have bony metastatic disease in 07/2020, confirmed with bone biopsy 08/06/20. -she began Xgeva  and fulvestrant  on 08/15/20 and palbociclib  on 09/12/20. She tolerates well overall. -FoundationOne testing obtained on her 07/2020 biopsy showed no targetable mutation.  -PET scan on 12/22/2022 showed treated diffuse bone mets, no hypermetabolic uptake  -Due to high co-pay, Ibrance  was switched to Verzenio  in February 2024, she is tolerating well with low-dose 100 mg twice daily, with occasional diarrhea. -continue Xgeva  every 3 months  - She underwent bilateral reconstruction revision surgery in July 2025.  Assessment & Plan Metastatic breast cancer The  metastatic breast cancer is well-managed, with the PET scan in April indicating stable disease with low activity. She is on anastrozole  and Verzenio , which she paused before her recent surgery and will now resume. She previously tolerated Verzenio  well, with only mild diarrhea initially, which resolved. She is also receiving Xgeva  to strengthen bones and prevent fractures due to bone metastasis. - Continue anastrozole  and Verzenio . - Resume Verzenio  post-surgical healing. - Continue Xgeva  every three months, with a small risk of jaw osteonecrosis, necessitating routine dental care. - Order PET scan in three months, around the end of October. - Schedule follow-up appointment in three months, around Thanksgiving. - Order lab work and PET scan one week before the next appointment. - Schedule Xgeva  injection with the next appointment.  Status post breast reconstruction with implant revision She recently underwent breast reconstruction revision to relieve tightness from implant retraction. The surgery was successful, and the incisions are healing well. She is satisfied with the outcome, and there are no complications reported. - Monitor surgical site for healing and any potential complications.  Plan - Her surgical incisions on bilateral breast has healed well, will restart Verzenio  later this week - Continue anastrozole  - Will proceed Xgeva  today and continue every 3 months, changed to every 6 months after next injection - Lab and follow-up in 3 months with restaging PET scan 1 week before   SUMMARY OF ONCOLOGIC HISTORY: Oncology History Overview Note   Cancer Staging  Malignant neoplasm metastatic to bone [facility]) Staging form: Bone - Appendicular Skeleton, Trunk, Skull, and Facial Bones, AJCC 8th Edition - Clinical: Stage IVB (cT3, cN1, cM1b) - Signed by Layla Sandria BROCKS, MD on 08/15/2020  Malignant neoplasm of lower-outer quadrant of left breast of female, estrogen receptor positive  (HCC) Staging form: Breast, AJCC 8th Edition -  Clinical: Stage IIIA (cT3, cN1, cM0, G2, ER+, PR-, HER2-) - Signed by Layla Sandria BROCKS, MD on 08/15/2020     Breast neoplasm, Tis (DCIS), right (Resolved)  05/16/2014 Initial Diagnosis   Breast neoplasm, Tis (DCIS), right   06/27/2020 Genetic Testing   Negative genetic testing on the CancerNext-Expanded+RNAinsight panel testing.  The CancerNext-Expanded gene panel offered by [facility] and includes sequencing and rearrangement analysis for the following 77 genes: AIP, ALK, APC*, ATM*, AXIN2, BAP1, BARD1, BLM, BMPR1A, BRCA1*, BRCA2*, BRIP1*, CDC73, CDH1*, CDK4, CDKN1B, CDKN2A, CHEK2*, CTNNA1, DICER1, FANCC, FH, FLCN, GALNT12, KIF1B, LZTR1, MAX, MEN1, MET, MLH1*, MSH2*, MSH3, MSH6*, MUTYH*, NBN, NF1*, NF2, NTHL1, PALB2*, PHOX2B, PMS2*, POT1, PRKAR1A, PTCH1, PTEN*, RAD51C*, RAD51D*, RB1, RECQL, RET, SDHA, SDHAF2, SDHB, SDHC, SDHD, SMAD4, SMARCA4, SMARCB1, SMARCE1, STK11, SUFU, TMEM127, TP53*, TSC1, TSC2, VHL and XRCC2 (sequencing and deletion/duplication); EGFR, EGLN1, HOXB13, KIT, MITF, PDGFRA, POLD1, and POLE (sequencing only); EPCAM and GREM1 (deletion/duplication only). DNA and RNA analyses performed for * genes. The report date is 06/27/2020.   Malignant neoplasm of lower-outer quadrant of left breast of female, estrogen receptor positive (HCC)  10/18/2019 Initial Diagnosis   Malignant neoplasm of lower-outer quadrant of left breast of female, estrogen receptor positive (HCC)   08/15/2020 Cancer Staging   Staging form: Breast, AJCC 8th Edition - Clinical: Stage IIIA (cT3, cN1, cM0, G2, ER+, PR-, HER2-) - Signed by Layla Sandria BROCKS, MD on 08/15/2020   Malignant neoplasm metastatic to bone (HCC)  08/05/2020 Initial Diagnosis   Bone metastases (HCC)   08/15/2020 Cancer Staging   Staging form: Bone - Appendicular Skeleton, Trunk, Skull, and Facial Bones, AJCC 8th Edition - Clinical: Stage IVB (cT3, cN1, cM1b) - Signed by Layla Sandria BROCKS,  MD on 08/15/2020   12/21/2022 PET scan    IMPRESSION: Status post bilateral breast reconstruction with left axillary lymph node dissection.   Multifocal/diffuse treated osseous metastases, as above.   Otherwise, no evidence of recurrent or metastatic disease.     12/03/2023 PET scan   IMPRESSION: No developing areas of abnormal radiotracer uptake.   The subtle uptake along the right pelvic sidewall on the prior is not well seen today it may have been bowel or ureteral related.  Extensive sclerotic bone disease once again identified. These areas are without specific abnormal radiotracer uptake. Spinal fixation rods.      Discussed the use of AI scribe software for clinical note transcription with the patient, who gave verbal consent to proceed.  History of Present Illness Natasha Graham is a 63 year old female with metastatic breast cancer who presents for follow-up.  She recently underwent revision surgery for breast reconstruction, which alleviated previous tightness, and she is satisfied with the outcome. In April 2025, a CT PET scan showed stable results. She continues on anastrozole  and plans to resume Verzenio  after pausing it for surgery. Initially, Verzenio  caused diarrhea, which resolved before surgery. She is on Xgeva  for bone metastasis since December 2021, with no recent dental issues. She takes supplements, including Trianex, for joint pain, which she paused before surgery and noticed significant pain relief upon resuming.     All other systems were reviewed with the patient and are negative.  MEDICAL HISTORY:  Past Medical History:  Diagnosis Date   Cancer [facility])    breast cancer left 2021 roght 2015   Family history of brain cancer    Family history of breast cancer    Family history of colon cancer    Family history of  stomach cancer    History of radiation therapy last done February 06 2020   Hypertension    PMB (postmenopausal bleeding)    PONV (postoperative  nausea and vomiting)    likes scopolamine  patch   Scoliosis    Wears glasses    Wears glasses     SURGICAL HISTORY: Past Surgical History:  Procedure Laterality Date   AUGMENTATION MAMMAPLASTY Right    2015 post mastectomy   AXILLARY LYMPH NODE DISSECTION Left 11/13/2019   Procedure: LEFT AXILLARY LYMPH NODE DISSECTION;  Surgeon: Ebbie Cough, MD;  Location: [facility];  Service: General;  Laterality: Left;   BREAST IMPLANT EXCHANGE Right 03/19/2021   Procedure: exchange of right saline implant;  Surgeon: Lowery Estefana RAMAN, DO;  Location: [facility];  Service: Plastics;  Laterality: Right;   BREAST RECONSTRUCTION WITH PLACEMENT OF TISSUE EXPANDER AND FLEX HD (ACELLULAR HYDRATED DERMIS) Right 05/16/2014   Procedure: IMMEDIATE RIGHT BREAST RECONSTRUCTION WITH PLACEMENT OF TISSUE EXPANDER AND FLEX HD (ACELLULAR HYDRATED DERMIS);  Surgeon: Estefana Reichert, DO;  Location: [facility] OR;  Service: Plastics;  Laterality: Right;   BREAST RECONSTRUCTION WITH PLACEMENT OF TISSUE EXPANDER AND FLEX HD (ACELLULAR HYDRATED DERMIS) Left 09/27/2019   Procedure: LEFT BREAST RECONSTRUCTION WITH PLACEMENT OF TISSUE EXPANDER AND FLEX HD (ACELLULAR HYDRATED DERMIS);  Surgeon: Lowery Estefana RAMAN, DO;  Location: [facility];  Service: Plastics;  Laterality: Left;   BREAST SURGERY     right breast excisional biopsy   DILATATION & CURETTAGE/HYSTEROSCOPY WITH MYOSURE N/A 12/20/2020   Procedure: DILATATION & CURETTAGE/HYSTEROSCOPY WITH  MYOSURE;  Surgeon: Gorge Ade, MD;  Location: [facility];  Service: Gynecology;  Laterality: N/A;   DILATION AND CURETTAGE OF UTERUS     HYSTEROSCOPY WITH D & C N/A 09/20/2015   Procedure: DILATATION AND CURETTAGE /HYSTEROSCOPY;  Surgeon: Ade Gorge, MD;  Location: [facility];  Service: Gynecology;  Laterality: N/A;   INCISIONAL HERNIA REPAIR N/A 04/27/2023   Procedure: LAPAROSCOPIC ASSISTED INCISIONAL HERNIA WITH MESH;   Surgeon: Ebbie Cough, MD;  Location: [facility];  Service: General;  Laterality: N/A;  GEN/TAP BLOCK   LIPOSUCTION Bilateral 08/08/2014   Procedure: LIPO SUCTION ;  Surgeon: Estefana Reichert, DO;  Location: [facility];  Service: Plastics;  Laterality: Bilateral;   LIPOSUCTION WITH LIPOFILLING Bilateral 03/16/2024   Procedure: LIPOSUCTION, WITH FAT TRANSFER;  Surgeon: Lowery Estefana RAMAN, DO;  Location: [facility];  Service: Plastics;  Laterality: Bilateral;   MASTECTOMY Right 05/16/2014   placement of acellular dermal matrix & tissue expanders    MASTOPEXY Left 08/08/2014   Procedure:  MASTOPEXY FOR SYMMETRY;  Surgeon: Estefana Reichert, DO;  Location: [facility];  Service: Plastics;  Laterality: Left;   NIPPLE SPARING MASTECTOMY WITH SENTINEL LYMPH NODE BIOPSY Left 09/27/2019   Procedure: LEFT NIPPLE SPARING MASTECTOMY WITH LEFT AXILLARY SENTINEL LYMPH NODE BIOPSY;  Surgeon: Ebbie Cough, MD;  Location: [facility];  Service: General;  Laterality: Left;   REDUCTION MAMMAPLASTY Left    2015   REMOVAL OF TISSUE EXPANDER AND PLACEMENT OF IMPLANT Right 08/08/2014   Procedure: REMOVAL OF RIGHT  TISSUE EXPANDERS WITH PLACEMENT OF RIGHT BREAST IMPLANTS WITH LIPO SUCTION ;  Surgeon: Estefana Reichert, DO;  Location: [facility];  Service: Plastics;  Laterality: Right;   REMOVAL OF TISSUE EXPANDER AND PLACEMENT OF IMPLANT Left 03/19/2021   Procedure: Removal of left expander for saline implant;  Surgeon: Lowery Estefana RAMAN, DO;  Location:  [facility];  Service: Plastics;  Laterality: Left;  2 hours   REVISION, RECONSTRUCTION, BREAST Bilateral 03/16/2024   Procedure: REVISION, RECONSTRUCTION, BREAST;  Surgeon: Lowery Estefana RAMAN, DO;  Location: [facility];  Service: Plastics;  Laterality: Bilateral;  Fat grafting bilateral breasts with release of scar contracture left breast   ROBOTIC ASSISTED TOTAL  HYSTERECTOMY WITH BILATERAL SALPINGO OOPHERECTOMY Bilateral 02/06/2021   Procedure: XI ROBOTIC ASSISTED TOTAL HYSTERECTOMY WITH BILATERAL SALPINGO OOPHORECTOMY, LYSIS of SIGMOID ADHESIONS MCCALL CUL DE PLASTY;  Surgeon: Gorge Ade, MD;  Location: [facility];  Service: Gynecology;  Laterality: Bilateral;   SCAR REVISION Left 11/13/2019   Procedure: EXCISION OF LEFT MASTECTOMY SKIN;  Surgeon: Ebbie Cough, MD;  Location: [facility];  Service: General;  Laterality: Left;   scoliosis  1972   harrington rods-age 38   TONSILLECTOMY  as child    I have reviewed the social history and family history with the patient and they are unchanged from previous note.  ALLERGIES:  has no known allergies.  MEDICATIONS:  Current Outpatient Medications  Medication Sig Dispense Refill   abemaciclib  (VERZENIO ) 100 MG tablet Take 1 tablet (100 mg total) by mouth 2 (two) times daily. 56 tablet 2   ALOE VERA PO Take 1 capsule by mouth daily.     anastrozole  (ARIMIDEX ) 1 MG tablet Take 1 tablet (1 mg total) by mouth daily. 90 tablet 3   Ascorbic Acid (VITAMIN C) 1000 MG tablet Take 1,000 mg by mouth daily.     Calcium Carb-Cholecalciferol (CALCIUM PLUS VITAMIN D3) 600-20 MG-MCG TABS Take 1 tablet by mouth 2 (two) times daily.     cholecalciferol (VITAMIN D3) 25 MCG (1000 UNIT) tablet Take 1,000 Units by mouth daily.     Coenzyme Q10 (COQ10) 200 MG CAPS Take 200 mg by mouth daily.     denosumab  (XGEVA ) 120 MG/1.7ML SOLN injection Inject 120 mg into the skin every 3 (three) months.     Flaxseed Oil (LINSEED OIL) OIL Take 1,200 mg by mouth daily.     hydrochlorothiazide  (HYDRODIURIL ) 25 MG tablet Take 25 mg by mouth daily.     ibuprofen  (ADVIL ) 200 MG tablet Take 400 mg by mouth every 8 (eight) hours as needed for moderate pain.     lisinopril  (ZESTRIL ) 30 MG tablet Take 30 mg by mouth daily.     Magnesium 400 MG TABS Take 400 mg by mouth every evening.     Multiple Vitamin  (MULTIVITAMIN) capsule Take 1 capsule by mouth daily.     ondansetron  (ZOFRAN -ODT) 4 MG disintegrating tablet Take 1 tablet (4 mg total) by mouth every 8 (eight) hours as needed for nausea or vomiting. (Patient not taking: Reported on 03/22/2024) 20 tablet 0   Potassium 99 MG TABS Take 99 mg by mouth in the morning and at bedtime.     Probiotic Product (PROBIOTIC DAILY PO) Take 1 capsule by mouth 2 (two) times a week.     Turmeric 500 MG CAPS Take 500 mg by mouth 2 (two) times daily.     No current facility-administered medications for this visit.    PHYSICAL EXAMINATION: ECOG PERFORMANCE STATUS: 1 - Symptomatic but completely ambulatory  Vitals:   03/29/24 0951 03/29/24 0952  BP: (!) 168/82 138/70  Pulse: 99   Resp: 19   Temp: 97.6 F (36.4 C)   SpO2: 96%    Wt Readings from Last 3 Encounters:  03/29/24 170 lb 9.6 oz (77.4 kg)  03/22/24 171 lb 12.8 oz (77.9 kg)  03/16/24 168 lb 6.9 oz (76.4 kg)     GENERAL:alert, no distress and comfortable SKIN: skin color, texture, turgor are normal, no rashes or significant lesions EYES: normal, Conjunctiva are pink and non-injected, sclera clear NECK: supple, thyroid  normal size, non-tender, without nodularity LYMPH:  no palpable lymphadenopathy in the cervical, axillary  LUNGS: clear to auscultation and percussion with normal breathing effort HEART: regular rate & rhythm and no murmurs and no lower extremity edema ABDOMEN:abdomen soft, non-tender and normal bowel sounds Musculoskeletal:no cyanosis of digits and no clubbing  NEURO: alert & oriented x 3 with fluent speech, no focal motor/sensory deficits  Physical Exam    LABORATORY DATA:  I have reviewed the data as listed    Latest Ref Rng & Units 03/29/2024    9:34 AM 12/15/2023    8:03 AM 09/15/2023    8:17 AM  CBC  WBC 4.0 - 10.5 K/uL 5.9  3.4  3.8   Hemoglobin 12.0 - 15.0 g/dL 87.3  87.1  87.2   Hematocrit 36.0 - 46.0 % 34.8  35.1  35.8   Platelets 150 - 400 K/uL 283  270   282         Latest Ref Rng & Units 03/29/2024    9:34 AM 03/13/2024    1:00 PM 12/15/2023    8:03 AM  CMP  Glucose 70 - 99 mg/dL 886  99  83   BUN 8 - 23 mg/dL 12  9  15    Creatinine 0.44 - 1.00 mg/dL 9.15  9.15  8.96   Sodium 135 - 145 mmol/L 140  138  141   Potassium 3.5 - 5.1 mmol/L 3.3  3.5  3.5   Chloride 98 - 111 mmol/L 102  101  103   CO2 22 - 32 mmol/L 32  27  30   Calcium 8.9 - 10.3 mg/dL 9.5  9.1  9.7   Total Protein 6.5 - 8.1 g/dL 7.1   7.1   Total Bilirubin 0.0 - 1.2 mg/dL 0.5   0.5   Alkaline Phos 38 - 126 U/L 55   47   AST 15 - 41 U/L 20   20   ALT 0 - 44 U/L 26   24       RADIOGRAPHIC STUDIES: I have personally reviewed the radiological images as listed and agreed with the findings in the report. No results found.    Orders Placed This Encounter  Procedures   NM PET Image Restag (PS) Skull Base To Thigh    Standing Status:   Future    Expected Date:   06/22/2024    Expiration Date:   03/29/2025    If indicated for the ordered procedure, I authorize the administration of a radiopharmaceutical per Radiology protocol:   Yes    Preferred imaging location?:   [facility]   All questions were answered. The patient knows to call the clinic with any problems, questions or concerns. No barriers to learning was detected. The total time spent in the appointment was 30 minutes, including review of chart and various tests results, discussions about plan of care and coordination of care plan     Onita Mattock, MD 03/29/2024

## 2024-03-30 ENCOUNTER — Other Ambulatory Visit (HOSPITAL_COMMUNITY): Payer: Self-pay

## 2024-03-30 ENCOUNTER — Other Ambulatory Visit: Payer: Self-pay

## 2024-04-04 ENCOUNTER — Ambulatory Visit (INDEPENDENT_AMBULATORY_CARE_PROVIDER_SITE_OTHER): Admitting: Plastic Surgery

## 2024-04-04 ENCOUNTER — Encounter: Payer: Self-pay | Admitting: Plastic Surgery

## 2024-04-04 VITALS — BP 165/78 | HR 100 | Ht 63.5 in | Wt 170.2 lb

## 2024-04-04 DIAGNOSIS — Z9889 Other specified postprocedural states: Secondary | ICD-10-CM

## 2024-04-04 NOTE — Progress Notes (Signed)
 The patient is a 63 year old female here for follow-up after undergoing contracture release and fat grafting to the breast.  She has noticed a difference.  There is less pulling and tightness particularly on the left lateral breast area.  She is pleased with the results.  There is no sign of infection.  She has 1 more follow-up visit and then we will go to yearly visits.  Pictures were obtained of the patient and placed in the chart with the patient's or guardian's permission.

## 2024-04-18 ENCOUNTER — Other Ambulatory Visit (HOSPITAL_COMMUNITY): Payer: Self-pay

## 2024-04-19 ENCOUNTER — Encounter: Payer: Self-pay | Admitting: Surgical

## 2024-04-19 ENCOUNTER — Ambulatory Visit: Admitting: Surgical

## 2024-04-19 VITALS — BP 151/78 | HR 87 | Ht 63.0 in | Wt 169.6 lb

## 2024-04-19 DIAGNOSIS — Z170 Estrogen receptor positive status [ER+]: Secondary | ICD-10-CM

## 2024-04-19 DIAGNOSIS — Z9011 Acquired absence of right breast and nipple: Secondary | ICD-10-CM

## 2024-04-19 DIAGNOSIS — Z9889 Other specified postprocedural states: Secondary | ICD-10-CM

## 2024-04-19 DIAGNOSIS — N651 Disproportion of reconstructed breast: Secondary | ICD-10-CM

## 2024-04-19 DIAGNOSIS — C50512 Malignant neoplasm of lower-outer quadrant of left female breast: Secondary | ICD-10-CM

## 2024-04-19 DIAGNOSIS — Z9012 Acquired absence of left breast and nipple: Secondary | ICD-10-CM

## 2024-04-19 NOTE — Progress Notes (Signed)
 63 year old female here for follow-up after undergoing contracture release and fat grafting to bilateral breast on 03/16/2024.  She is 5 weeks postop.  She reports she is doing well.  She has no specific concerns or questions.  She does have a Monocryl suture stitch that she would like removed.  She is happy with her result so far and has noticed improvement.    Chaperone present on exam On exam breast incisions are intact and well-healed.  Abdominal incisions are intact and well-healed.  No erythema or cellulitic changes noted of breast or abdomen.  No ecchymosis.  No swelling noted. She still has some scar contracture noted of the left lateral breast, but improved.  A/P:  Patient is doing well, no signs of infection or concern on exam.  All of her questions were answered to her content.  We will plan to see her for yearly follow-ups unless she has changes prior to then.

## 2024-04-25 ENCOUNTER — Other Ambulatory Visit: Payer: Self-pay

## 2024-04-25 NOTE — Progress Notes (Signed)
 Specialty Pharmacy Refill Coordination Note  Natasha Graham is a 63 y.o. female contacted today regarding refills of specialty medication(s) Abemaciclib  (VERZENIO )   Patient requested [name] at [facility] at [place] date: 04/26/24   Medication will be filled on 04/25/24.

## 2024-05-24 ENCOUNTER — Other Ambulatory Visit (HOSPITAL_COMMUNITY): Payer: Self-pay

## 2024-05-24 ENCOUNTER — Other Ambulatory Visit: Payer: Self-pay

## 2024-05-24 NOTE — Progress Notes (Signed)
 Specialty Pharmacy Refill Coordination Note  Natasha Graham is a 63 y.o. female contacted today regarding refills of specialty medication(s) Abemaciclib  (VERZENIO )   Patient requested [name] at [facility] at [place] date: 05/27/24   Medication will be filled on 05/26/24.

## 2024-05-25 ENCOUNTER — Other Ambulatory Visit: Payer: Self-pay

## 2024-05-27 ENCOUNTER — Other Ambulatory Visit (HOSPITAL_COMMUNITY): Payer: Self-pay

## 2024-06-08 ENCOUNTER — Other Ambulatory Visit: Payer: Self-pay

## 2024-06-15 ENCOUNTER — Encounter (INDEPENDENT_AMBULATORY_CARE_PROVIDER_SITE_OTHER): Payer: Self-pay

## 2024-06-15 ENCOUNTER — Other Ambulatory Visit: Payer: Self-pay | Admitting: Hematology

## 2024-06-15 ENCOUNTER — Other Ambulatory Visit: Payer: Self-pay

## 2024-06-15 MED ORDER — ABEMACICLIB 100 MG PO TABS
100.0000 mg | ORAL_TABLET | Freq: Two times a day (BID) | ORAL | 2 refills | Status: DC
  Filled 2024-06-15 – 2024-06-16 (×2): qty 56, 28d supply, fill #0
  Filled 2024-07-18: qty 56, 28d supply, fill #1
  Filled 2024-08-14: qty 56, 28d supply, fill #2

## 2024-06-16 ENCOUNTER — Other Ambulatory Visit: Payer: Self-pay

## 2024-06-16 NOTE — Progress Notes (Signed)
 Specialty Pharmacy Refill Coordination Note  Natasha Graham is a 63 y.o. female contacted today regarding refills of specialty medication(s) Abemaciclib  (VERZENIO )   Patient requested (Patient-Rptd) Pickup at [facility] date: (Patient-Rptd) 06/27/24   Medication will be filled on 06/26/24.

## 2024-06-19 ENCOUNTER — Ambulatory Visit (HOSPITAL_COMMUNITY)
Admission: RE | Admit: 2024-06-19 | Discharge: 2024-06-19 | Disposition: A | Discharge status: Home / Self Care | Source: Ambulatory Visit | Attending: Hematology | Admitting: Hematology

## 2024-06-19 DIAGNOSIS — Z170 Estrogen receptor positive status [ER+]: Secondary | ICD-10-CM | POA: Diagnosis present

## 2024-06-19 DIAGNOSIS — Z9889 Other specified postprocedural states: Secondary | ICD-10-CM | POA: Insufficient documentation

## 2024-06-19 DIAGNOSIS — C7951 Secondary malignant neoplasm of bone: Secondary | ICD-10-CM | POA: Insufficient documentation

## 2024-06-19 DIAGNOSIS — C50512 Malignant neoplasm of lower-outer quadrant of left female breast: Secondary | ICD-10-CM | POA: Diagnosis not present

## 2024-06-19 LAB — GLUCOSE, CAPILLARY: Glucose-Capillary: 119 mg/dL — ABNORMAL HIGH (ref 70–99)

## 2024-06-19 MED ORDER — FLUDEOXYGLUCOSE F - 18 (FDG) INJECTION
8.8000 | Freq: Once | INTRAVENOUS | Status: AC
  Administered 2024-06-19: 8.32 via INTRAVENOUS

## 2024-06-22 ENCOUNTER — Encounter (HOSPITAL_COMMUNITY)

## 2024-06-22 ENCOUNTER — Other Ambulatory Visit

## 2024-06-22 ENCOUNTER — Other Ambulatory Visit: Payer: Self-pay | Admitting: Hematology

## 2024-06-26 ENCOUNTER — Other Ambulatory Visit: Payer: Self-pay

## 2024-06-27 NOTE — Assessment & Plan Note (Addendum)
 Metastatic Breast Cancer to bones, lobular carcinoma, ER+/PR+, HER2 IHC 2+ (low expression) -left breast cancer initially diagnosed 07/12/19. S/p left mastectomy on 09/27/19 showed stage IIIA p(T3, N1) ILC, SLND on 11/13/19 was negative (0/7). Mammaprint was low risk. -she received postmastectomy radiation 12/26/19 - 02/06/20 and took tamoxifen  02/29/20 - 07/2020 -genetics 06/27/20 were negative. -she was found to have bony metastatic disease in 07/2020, confirmed with bone biopsy 08/06/20. -she began Xgeva  and fulvestrant  on 08/15/20 and palbociclib  on 09/12/20.  -FoundationOne testing obtained on her 07/2020 biopsy showed no targetable mutation.  -PET scan on 12/22/2022 showed treated diffuse bone mets, no hypermetabolic uptake  -Due to high co-pay, Ibrance  was switched to Verzenio  in February 2024, she is tolerating well with low-dose 100 mg twice daily, with occasional diarrhea. -Per patient's request, I changed fulvestrant  injection to anastrozole  in October 2024. -continue Xgeva  every 3 months  - She underwent bilateral reconstruction revision surgery in July 2025. -PET 06/19/2024 showed non-hypermetabolic stable bone lesions.

## 2024-06-28 ENCOUNTER — Inpatient Hospital Stay

## 2024-06-28 ENCOUNTER — Inpatient Hospital Stay: Attending: Hematology | Admitting: Hematology

## 2024-06-28 ENCOUNTER — Other Ambulatory Visit

## 2024-06-28 VITALS — BP 140/92 | HR 115 | Temp 97.8°F | Resp 18 | Ht 63.0 in | Wt 169.9 lb

## 2024-06-28 DIAGNOSIS — Z79811 Long term (current) use of aromatase inhibitors: Secondary | ICD-10-CM | POA: Insufficient documentation

## 2024-06-28 DIAGNOSIS — C50512 Malignant neoplasm of lower-outer quadrant of left female breast: Secondary | ICD-10-CM | POA: Insufficient documentation

## 2024-06-28 DIAGNOSIS — C7951 Secondary malignant neoplasm of bone: Secondary | ICD-10-CM

## 2024-06-28 DIAGNOSIS — Z170 Estrogen receptor positive status [ER+]: Secondary | ICD-10-CM | POA: Diagnosis not present

## 2024-06-28 DIAGNOSIS — Z79899 Other long term (current) drug therapy: Secondary | ICD-10-CM | POA: Insufficient documentation

## 2024-06-28 LAB — CMP (CANCER CENTER ONLY)
ALT: 26 U/L (ref 0–44)
AST: 21 U/L (ref 15–41)
Albumin: 4.2 g/dL (ref 3.5–5.0)
Alkaline Phosphatase: 45 U/L (ref 38–126)
Anion gap: 7 (ref 5–15)
BUN: 11 mg/dL (ref 8–23)
CO2: 31 mmol/L (ref 22–32)
Calcium: 9.7 mg/dL (ref 8.9–10.3)
Chloride: 101 mmol/L (ref 98–111)
Creatinine: 1.08 mg/dL — ABNORMAL HIGH (ref 0.44–1.00)
GFR, Estimated: 57 mL/min — ABNORMAL LOW (ref >60)
Glucose, Bld: 94 mg/dL (ref 70–99)
Potassium: 3.6 mmol/L (ref 3.5–5.1)
Sodium: 139 mmol/L (ref 135–145)
Total Bilirubin: 0.4 mg/dL (ref 0.0–1.2)
Total Protein: 7.1 g/dL (ref 6.5–8.1)

## 2024-06-28 LAB — CBC WITH DIFFERENTIAL (CANCER CENTER ONLY)
Abs Immature Granulocytes: 0.02 K/uL (ref 0.00–0.07)
Basophils Absolute: 0.1 K/uL (ref 0.0–0.1)
Basophils Relative: 2 %
Eosinophils Absolute: 0 K/uL (ref 0.0–0.5)
Eosinophils Relative: 1 %
HCT: 34.3 % — ABNORMAL LOW (ref 36.0–46.0)
Hemoglobin: 12.6 g/dL (ref 12.0–15.0)
Immature Granulocytes: 1 %
Lymphocytes Relative: 38 %
Lymphs Abs: 1.2 K/uL (ref 0.7–4.0)
MCH: 35.8 pg — ABNORMAL HIGH (ref 26.0–34.0)
MCHC: 36.7 g/dL — ABNORMAL HIGH (ref 30.0–36.0)
MCV: 97.4 fL (ref 80.0–100.0)
Monocytes Absolute: 0.3 K/uL (ref 0.1–1.0)
Monocytes Relative: 9 %
Neutro Abs: 1.6 K/uL — ABNORMAL LOW (ref 1.7–7.7)
Neutrophils Relative %: 49 %
Platelet Count: 266 K/uL (ref 150–400)
RBC: 3.52 MIL/uL — ABNORMAL LOW (ref 3.87–5.11)
RDW: 12.9 % (ref 11.5–15.5)
WBC Count: 3.2 K/uL — ABNORMAL LOW (ref 4.0–10.5)
nRBC: 0 % (ref 0.0–0.2)

## 2024-06-28 MED ORDER — DENOSUMAB 120 MG/1.7ML ~~LOC~~ SOLN
120.0000 mg | Freq: Once | SUBCUTANEOUS | Status: AC
  Administered 2024-06-28: 120 mg via SUBCUTANEOUS
  Filled 2024-06-28: qty 1.7

## 2024-06-28 NOTE — Progress Notes (Signed)
 [facility]   [phone] [phone]   Clinic Follow up Note   Patient Care Team: Hamrick, Charlene CROME, MD as PCP - General (Family Medicine) Ebbie Cough, MD as Consulting Physician (General Surgery) Tyree Nanetta SAILOR, RN as Registered Nurse Dillingham, Estefana RAMAN, DO as Attending Physician (Plastic Surgery) Shannon Agent, MD as Consulting Physician (Radiation Oncology) Gorge Ade, MD as Consulting Physician (Obstetrics and Gynecology) Lucila Norleen LABOR, RPH-CPP (Pharmacist) Lanny Callander, MD as Consulting Physician (Medical Oncology)  Date of Service:  06/28/2024  CHIEF COMPLAINT: f/u of metastatic breast cancer  CURRENT THERAPY:  Anastrozole , Verzenio  100 mg twice daily Xgeva  every 3 months  Oncology History   Malignant neoplasm metastatic to bone [facility]) Metastatic Breast Cancer to bones, lobular carcinoma, ER+/PR+, HER2 IHC 2+ (low expression) -left breast cancer initially diagnosed 07/12/19. S/p left mastectomy on 09/27/19 showed stage IIIA p(T3, N1) ILC, SLND on 11/13/19 was negative (0/7). Mammaprint was low risk. -she received postmastectomy radiation 12/26/19 - 02/06/20 and took tamoxifen  02/29/20 - 07/2020 -genetics 06/27/20 were negative. -she was found to have bony metastatic disease in 07/2020, confirmed with bone biopsy 08/06/20. -she began Xgeva  and fulvestrant  on 08/15/20 and palbociclib  on 09/12/20.  -FoundationOne testing obtained on her 07/2020 biopsy showed no targetable mutation.  -PET scan on 12/22/2022 showed treated diffuse bone mets, no hypermetabolic uptake  -Due to high co-pay, Ibrance  was switched to Verzenio  in February 2024, she is tolerating well with low-dose 100 mg twice daily, with occasional diarrhea. -Per patient's request, I changed fulvestrant  injection to anastrozole  in October 2024. -continue Xgeva  every 3 months  - She underwent bilateral reconstruction revision surgery in July 2025. -PET 06/19/2024 showed  non-hypermetabolic stable bone lesions.   Assessment & Plan Metastatic breast cancer with stable bone metastasis Metastatic breast cancer with stable bone metastasis. Recent PET scan shows no new lesions and stable older bone lesion. Blood counts are well-managed with slightly low white count. No anemia or platelet issues. Kidney and liver functions are normal. Potassium levels have normalized with supplementation. She has been on Xgeva  for nearly four years, with plans to extend treatment to five years, potentially spacing doses to every three months, and then every six months thereafter. Dental health is stable, and she is taking calcium and vitamin D supplements. - Continue anastrozole  as prescribed. - Administered Xgeva  injection today. - Scheduled next Xgeva  injection in three months. - Continue calcium and vitamin D supplementation. - Monitor for dental issues and hold Xgeva  if dental procedures are needed. - Plan for annual PET scan if stable.  Drug-induced diarrhea Diarrhea likely induced by anastrozole . She experienced a spell of diarrhea a month ago but has managed symptoms with aloe vera gel and kefir, resulting in improvement over the last few weeks. - Continue using aloe vera gel and kefir to manage diarrhea. - Monitor for recurrence of diarrhea and adjust management as needed.  Plan - She is clinically doing very well, asymptomatic, she is tolerating treatment well with mild intermittent diarrhea - Continue anastrozole  and Verzenio  - Proceed Xgeva  injection today and continue 3 months for now - Follow-up in 3 months   SUMMARY OF ONCOLOGIC HISTORY: Oncology History Overview Note   Cancer Staging  Malignant neoplasm metastatic to bone [facility]) Staging form: Bone - Appendicular Skeleton, Trunk, Skull, and Facial Bones, AJCC 8th Edition - Clinical: Stage IVB (cT3, cN1, cM1b) - Signed by Layla Sandria BROCKS, MD on 08/15/2020  Malignant neoplasm of lower-outer quadrant of left  breast of female,  estrogen receptor positive (HCC) Staging form: Breast, AJCC 8th Edition - Clinical: Stage IIIA (cT3, cN1, cM0, G2, ER+, PR-, HER2-) - Signed by Layla Sandria BROCKS, MD on 08/15/2020     Breast neoplasm, Tis (DCIS), right (Resolved)  05/16/2014 Initial Diagnosis   Breast neoplasm, Tis (DCIS), right   06/27/2020 Genetic Testing   Negative genetic testing on the CancerNext-Expanded+RNAinsight panel testing.  The CancerNext-Expanded gene panel offered by [facility] and includes sequencing and rearrangement analysis for the following 77 genes: AIP, ALK, APC*, ATM*, AXIN2, BAP1, BARD1, BLM, BMPR1A, BRCA1*, BRCA2*, BRIP1*, CDC73, CDH1*, CDK4, CDKN1B, CDKN2A, CHEK2*, CTNNA1, DICER1, FANCC, FH, FLCN, GALNT12, KIF1B, LZTR1, MAX, MEN1, MET, MLH1*, MSH2*, MSH3, MSH6*, MUTYH*, NBN, NF1*, NF2, NTHL1, PALB2*, PHOX2B, PMS2*, POT1, PRKAR1A, PTCH1, PTEN*, RAD51C*, RAD51D*, RB1, RECQL, RET, SDHA, SDHAF2, SDHB, SDHC, SDHD, SMAD4, SMARCA4, SMARCB1, SMARCE1, STK11, SUFU, TMEM127, TP53*, TSC1, TSC2, VHL and XRCC2 (sequencing and deletion/duplication); EGFR, EGLN1, HOXB13, KIT, MITF, PDGFRA, POLD1, and POLE (sequencing only); EPCAM and GREM1 (deletion/duplication only). DNA and RNA analyses performed for * genes. The report date is 06/27/2020.   Malignant neoplasm of lower-outer quadrant of left breast of female, estrogen receptor positive (HCC)  10/18/2019 Initial Diagnosis   Malignant neoplasm of lower-outer quadrant of left breast of female, estrogen receptor positive (HCC)   08/15/2020 Cancer Staging   Staging form: Breast, AJCC 8th Edition - Clinical: Stage IIIA (cT3, cN1, cM0, G2, ER+, PR-, HER2-) - Signed by Layla Sandria BROCKS, MD on 08/15/2020   Malignant neoplasm metastatic to bone (HCC)  08/05/2020 Initial Diagnosis   Bone metastases (HCC)   08/15/2020 Cancer Staging   Staging form: Bone - Appendicular Skeleton, Trunk, Skull, and Facial Bones, AJCC 8th Edition - Clinical: Stage IVB  (cT3, cN1, cM1b) - Signed by Layla Sandria BROCKS, MD on 08/15/2020   12/21/2022 PET scan    IMPRESSION: Status post bilateral breast reconstruction with left axillary lymph node dissection.   Multifocal/diffuse treated osseous metastases, as above.   Otherwise, no evidence of recurrent or metastatic disease.     12/03/2023 PET scan   IMPRESSION: No developing areas of abnormal radiotracer uptake.   The subtle uptake along the right pelvic sidewall on the prior is not well seen today it may have been bowel or ureteral related.  Extensive sclerotic bone disease once again identified. These areas are without specific abnormal radiotracer uptake. Spinal fixation rods.      Discussed the use of AI scribe software for clinical note transcription with the patient, who gave verbal consent to proceed.  History of Present Illness Natasha Graham is a 63 year old female with metastatic breast cancer who presents for follow-up.  She has no new symptoms, with stable appetite and weight, and describes her energy level as 'okay'. Recent blood work shows a slightly low white blood cell count, with normal kidney and liver functions. Potassium levels have normalized with supplementation. A PET scan from last week shows no new lesions, and the existing bone lesion is stable. She is on anastrozole  and Visenial, with a recent refill of anastrozole . She experienced diarrhea a month ago, resolved with aloe vera gel pills and kefir. She has been on Xgeva  for nearly four years, takes calcium and vitamin D supplements, and reports no dental problems with normal recent dental x-rays.     All other systems were reviewed with the patient and are negative.  MEDICAL HISTORY:  Past Medical History:  Diagnosis Date   Cancer [facility])    breast cancer  left 2021 roght 2015   Family history of brain cancer    Family history of breast cancer    Family history of colon cancer    Family history of stomach cancer     History of radiation therapy last done February 06 2020   Hypertension    PMB (postmenopausal bleeding)    PONV (postoperative nausea and vomiting)    likes scopolamine  patch   Scoliosis    Wears glasses    Wears glasses     SURGICAL HISTORY: Past Surgical History:  Procedure Laterality Date   AUGMENTATION MAMMAPLASTY Right    2015 post mastectomy   AXILLARY LYMPH NODE DISSECTION Left 11/13/2019   Procedure: LEFT AXILLARY LYMPH NODE DISSECTION;  Surgeon: Ebbie Cough, MD;  Location: [facility];  Service: General;  Laterality: Left;   BREAST IMPLANT EXCHANGE Right 03/19/2021   Procedure: exchange of right saline implant;  Surgeon: Lowery Estefana RAMAN, DO;  Location: [facility];  Service: Plastics;  Laterality: Right;   BREAST RECONSTRUCTION WITH PLACEMENT OF TISSUE EXPANDER AND FLEX HD (ACELLULAR HYDRATED DERMIS) Right 05/16/2014   Procedure: IMMEDIATE RIGHT BREAST RECONSTRUCTION WITH PLACEMENT OF TISSUE EXPANDER AND FLEX HD (ACELLULAR HYDRATED DERMIS);  Surgeon: Estefana Reichert, DO;  Location: [facility];  Service: Plastics;  Laterality: Right;   BREAST RECONSTRUCTION WITH PLACEMENT OF TISSUE EXPANDER AND FLEX HD (ACELLULAR HYDRATED DERMIS) Left 09/27/2019   Procedure: LEFT BREAST RECONSTRUCTION WITH PLACEMENT OF TISSUE EXPANDER AND FLEX HD (ACELLULAR HYDRATED DERMIS);  Surgeon: Lowery Estefana RAMAN, DO;  Location: [facility];  Service: Plastics;  Laterality: Left;   BREAST SURGERY     right breast excisional biopsy   DILATATION & CURETTAGE/HYSTEROSCOPY WITH MYOSURE N/A 12/20/2020   Procedure: DILATATION & CURETTAGE/HYSTEROSCOPY WITH  MYOSURE;  Surgeon: Gorge Ade, MD;  Location: [facility];  Service: Gynecology;  Laterality: N/A;   DILATION AND CURETTAGE OF UTERUS     HYSTEROSCOPY WITH D & C N/A 09/20/2015   Procedure: DILATATION AND CURETTAGE /HYSTEROSCOPY;  Surgeon: Ade Gorge, MD;  Location: [facility];  Service: Gynecology;   Laterality: N/A;   INCISIONAL HERNIA REPAIR N/A 04/27/2023   Procedure: LAPAROSCOPIC ASSISTED INCISIONAL HERNIA WITH MESH;  Surgeon: Ebbie Cough, MD;  Location: [facility] OR;  Service: General;  Laterality: N/A;  GEN/TAP BLOCK   LIPOSUCTION Bilateral 08/08/2014   Procedure: LIPO SUCTION ;  Surgeon: Estefana Reichert, DO;  Location: [facility];  Service: Plastics;  Laterality: Bilateral;   LIPOSUCTION WITH LIPOFILLING Bilateral 03/16/2024   Procedure: LIPOSUCTION, WITH FAT TRANSFER;  Surgeon: Lowery Estefana RAMAN, DO;  Location: [facility];  Service: Plastics;  Laterality: Bilateral;   MASTECTOMY Right 05/16/2014   placement of acellular dermal matrix & tissue expanders    MASTOPEXY Left 08/08/2014   Procedure:  MASTOPEXY FOR SYMMETRY;  Surgeon: Estefana Reichert, DO;  Location: [facility];  Service: Plastics;  Laterality: Left;   NIPPLE SPARING MASTECTOMY WITH SENTINEL LYMPH NODE BIOPSY Left 09/27/2019   Procedure: LEFT NIPPLE SPARING MASTECTOMY WITH LEFT AXILLARY SENTINEL LYMPH NODE BIOPSY;  Surgeon: Ebbie Cough, MD;  Location: [facility];  Service: General;  Laterality: Left;   REDUCTION MAMMAPLASTY Left    2015   REMOVAL OF TISSUE EXPANDER AND PLACEMENT OF IMPLANT Right 08/08/2014   Procedure: REMOVAL OF RIGHT  TISSUE EXPANDERS WITH PLACEMENT OF RIGHT BREAST IMPLANTS WITH LIPO SUCTION ;  Surgeon: Estefana Reichert, DO;  Location: [facility];  Service: Plastics;  Laterality: Right;   REMOVAL OF TISSUE EXPANDER AND PLACEMENT OF IMPLANT Left 03/19/2021   Procedure: Removal of left expander for saline implant;  Surgeon: Lowery Estefana RAMAN, DO;  Location: [facility];  Service: Plastics;  Laterality: Left;  2 hours   REVISION, RECONSTRUCTION, BREAST Bilateral 03/16/2024   Procedure: REVISION, RECONSTRUCTION, BREAST;  Surgeon: Lowery Estefana RAMAN, DO;  Location: [facility];  Service: Plastics;  Laterality:  Bilateral;  Fat grafting bilateral breasts with release of scar contracture left breast   ROBOTIC ASSISTED TOTAL HYSTERECTOMY WITH BILATERAL SALPINGO OOPHERECTOMY Bilateral 02/06/2021   Procedure: XI ROBOTIC ASSISTED TOTAL HYSTERECTOMY WITH BILATERAL SALPINGO OOPHORECTOMY, LYSIS of SIGMOID ADHESIONS MCCALL CUL DE PLASTY;  Surgeon: Gorge Ade, MD;  Location: [facility];  Service: Gynecology;  Laterality: Bilateral;   SCAR REVISION Left 11/13/2019   Procedure: EXCISION OF LEFT MASTECTOMY SKIN;  Surgeon: Ebbie Cough, MD;  Location: [facility];  Service: General;  Laterality: Left;   scoliosis  1972   harrington rods-age 57   TONSILLECTOMY  as child    I have reviewed the social history and family history with the patient and they are unchanged from previous note.  ALLERGIES:  has no known allergies.  MEDICATIONS:  Current Outpatient Medications  Medication Sig Dispense Refill   abemaciclib  (VERZENIO ) 100 MG tablet Take 1 tablet (100 mg total) by mouth 2 (two) times daily. 56 tablet 2   ALOE VERA PO Take 1 capsule by mouth daily.     anastrozole  (ARIMIDEX ) 1 MG tablet Take 1 tablet by mouth once daily 90 tablet 0   Ascorbic Acid (VITAMIN C) 1000 MG tablet Take 1,000 mg by mouth daily.     Calcium Carb-Cholecalciferol (CALCIUM PLUS VITAMIN D3) 600-20 MG-MCG TABS Take 1 tablet by mouth 2 (two) times daily.     cholecalciferol (VITAMIN D3) 25 MCG (1000 UNIT) tablet Take 1,000 Units by mouth daily.     Coenzyme Q10 (COQ10) 200 MG CAPS Take 200 mg by mouth daily.     denosumab  (XGEVA ) 120 MG/1.7ML SOLN injection Inject 120 mg into the skin every 3 (three) months.     Flaxseed Oil (LINSEED OIL) OIL Take 1,200 mg by mouth daily.     hydrochlorothiazide  (HYDRODIURIL ) 25 MG tablet Take 25 mg by mouth daily.     ibuprofen  (ADVIL ) 200 MG tablet Take 400 mg by mouth every 8 (eight) hours as needed for moderate pain.     lisinopril  (ZESTRIL ) 30 MG tablet Take 30 mg  by mouth daily.     Magnesium 400 MG TABS Take 400 mg by mouth every evening.     Multiple Vitamin (MULTIVITAMIN) capsule Take 1 capsule by mouth daily.     ondansetron  (ZOFRAN -ODT) 4 MG disintegrating tablet Take 1 tablet (4 mg total) by mouth every 8 (eight) hours as needed for nausea or vomiting. 20 tablet 0   Potassium 99 MG TABS Take 99 mg by mouth in the morning and at bedtime.     Probiotic Product (PROBIOTIC DAILY PO) Take 1 capsule by mouth 2 (two) times a week.     Turmeric 500 MG CAPS Take 500 mg by mouth 2 (two) times daily.     No current facility-administered medications for this visit.    PHYSICAL EXAMINATION: ECOG PERFORMANCE STATUS: 0 - Asymptomatic  Vitals:   06/28/24 0912 06/28/24 0917  BP: (!) 148/90 (!) 140/92  Pulse: (!) 115   Resp: 18   Temp: 97.8 F (36.6  C)   SpO2: 97%    Wt Readings from Last 3 Encounters:  06/28/24 169 lb 14.4 oz (77.1 kg)  04/19/24 169 lb 9.6 oz (76.9 kg)  04/04/24 170 lb 3.2 oz (77.2 kg)     GENERAL:alert, no distress and comfortable SKIN: skin color, texture, turgor are normal, no rashes or significant lesions EYES: normal, Conjunctiva are pink and non-injected, sclera clear NECK: supple, thyroid  normal size, non-tender, without nodularity LYMPH:  no palpable lymphadenopathy in the cervical, axillary  LUNGS: clear to auscultation and percussion with normal breathing effort HEART: regular rate & rhythm and no murmurs and no lower extremity edema ABDOMEN:abdomen soft, non-tender and normal bowel sounds Musculoskeletal:no cyanosis of digits and no clubbing  NEURO: alert & oriented x 3 with fluent speech, no focal motor/sensory deficits  Physical Exam    LABORATORY DATA:  I have reviewed the data as listed    Latest Ref Rng & Units 06/28/2024    8:35 AM 03/29/2024    9:34 AM 12/15/2023    8:03 AM  CBC  WBC 4.0 - 10.5 K/uL 3.2  5.9  3.4   Hemoglobin 12.0 - 15.0 g/dL 87.3  87.3  87.1   Hematocrit 36.0 - 46.0 % 34.3  34.8   35.1   Platelets 150 - 400 K/uL 266  283  270         Latest Ref Rng & Units 06/28/2024    8:35 AM 03/29/2024    9:34 AM 03/13/2024    1:00 PM  CMP  Glucose 70 - 99 mg/dL 94  886  99   BUN 8 - 23 mg/dL 11  12  9    Creatinine 0.44 - 1.00 mg/dL 8.91  9.15  9.15   Sodium 135 - 145 mmol/L 139  140  138   Potassium 3.5 - 5.1 mmol/L 3.6  3.3  3.5   Chloride 98 - 111 mmol/L 101  102  101   CO2 22 - 32 mmol/L 31  32  27   Calcium 8.9 - 10.3 mg/dL 9.7  9.5  9.1   Total Protein 6.5 - 8.1 g/dL 7.1  7.1    Total Bilirubin 0.0 - 1.2 mg/dL 0.4  0.5    Alkaline Phos 38 - 126 U/L 45  55    AST 15 - 41 U/L 21  20    ALT 0 - 44 U/L 26  26        RADIOGRAPHIC STUDIES: I have personally reviewed the radiological images as listed and agreed with the findings in the report. No results found.    No orders of the defined types were placed in this encounter.  All questions were answered. The patient knows to call the clinic with any problems, questions or concerns. No barriers to learning was detected. The total time spent in the appointment was 30 minutes, including review of chart and various tests results, discussions about plan of care and coordination of care plan     Onita Mattock, MD 06/28/2024

## 2024-06-29 ENCOUNTER — Other Ambulatory Visit: Payer: Self-pay

## 2024-06-29 NOTE — Progress Notes (Signed)
 Specialty Pharmacy Ongoing Clinical Assessment Note  Natasha Graham is a 63 y.o. female who is being followed by the specialty pharmacy service for RxSp Oncology   Patient's specialty medication(s) reviewed today: Abemaciclib  (VERZENIO )   Missed doses in the last 4 weeks: 0   Patient/Caregiver did not have any additional questions or concerns.   Therapeutic benefit summary: Patient is achieving benefit   Adverse events/side effects summary: Experienced adverse events/side effects (occasional diarrhea, uses imodium if needed)   Patient's therapy is appropriate to: Continue    Goals Addressed             This Visit's Progress    Slow Disease Progression   On track    Patient is on track. Patient will maintain adherence.  Most recent visit notes from 10/29 stated recent PET scan on 10/20 showed no new lesions and stable older bone lesions.        Follow up: 6 months  [facility]

## 2024-07-18 ENCOUNTER — Other Ambulatory Visit: Payer: Self-pay

## 2024-07-18 ENCOUNTER — Other Ambulatory Visit (HOSPITAL_COMMUNITY): Payer: Self-pay

## 2024-07-18 NOTE — Progress Notes (Signed)
 Specialty Pharmacy Refill Coordination Note  Natasha Graham is a 63 y.o. female contacted today regarding refills of specialty medication(s) Abemaciclib  (VERZENIO )   Patient requested [name] at [facility] at [place] date: 07/24/24   Medication will be filled on: 07/21/24

## 2024-07-20 ENCOUNTER — Other Ambulatory Visit: Payer: Self-pay

## 2024-08-04 DIAGNOSIS — E7800 Pure hypercholesterolemia, unspecified: Secondary | ICD-10-CM | POA: Diagnosis not present

## 2024-08-04 DIAGNOSIS — I10 Essential (primary) hypertension: Secondary | ICD-10-CM | POA: Diagnosis not present

## 2024-08-04 DIAGNOSIS — K760 Fatty (change of) liver, not elsewhere classified: Secondary | ICD-10-CM | POA: Diagnosis not present

## 2024-08-04 DIAGNOSIS — C50912 Malignant neoplasm of unspecified site of left female breast: Secondary | ICD-10-CM | POA: Diagnosis not present

## 2024-08-14 ENCOUNTER — Other Ambulatory Visit: Payer: Self-pay

## 2024-08-15 ENCOUNTER — Other Ambulatory Visit: Payer: Self-pay

## 2024-08-15 NOTE — Progress Notes (Signed)
 Specialty Pharmacy Refill Coordination Note  Natasha Graham is a 63 y.o. female contacted today regarding refills of specialty medication(s) Abemaciclib  (VERZENIO )   Patient requested [name] at [facility] at [place] date: 08/18/24   Medication will be filled on: 08/17/24

## 2024-08-17 ENCOUNTER — Other Ambulatory Visit: Payer: Self-pay

## 2024-09-06 ENCOUNTER — Other Ambulatory Visit: Payer: Self-pay | Admitting: Hematology

## 2024-09-06 ENCOUNTER — Other Ambulatory Visit (HOSPITAL_COMMUNITY): Payer: Self-pay

## 2024-09-06 ENCOUNTER — Other Ambulatory Visit: Payer: Self-pay

## 2024-09-06 ENCOUNTER — Encounter: Payer: Self-pay | Admitting: Hematology

## 2024-09-06 MED ORDER — ABEMACICLIB 100 MG PO TABS
100.0000 mg | ORAL_TABLET | Freq: Two times a day (BID) | ORAL | 2 refills | Status: AC
  Filled 2024-09-06 – 2024-09-13 (×4): qty 56, 28d supply, fill #0
  Filled 2024-10-06: qty 56, 28d supply, fill #1

## 2024-09-08 ENCOUNTER — Other Ambulatory Visit: Payer: Self-pay

## 2024-09-11 ENCOUNTER — Other Ambulatory Visit: Payer: Self-pay

## 2024-09-11 ENCOUNTER — Other Ambulatory Visit: Payer: Self-pay | Admitting: Pharmacy Technician

## 2024-09-13 ENCOUNTER — Telehealth: Payer: Self-pay

## 2024-09-13 ENCOUNTER — Encounter: Payer: Self-pay | Admitting: Hematology

## 2024-09-13 ENCOUNTER — Other Ambulatory Visit (HOSPITAL_COMMUNITY): Payer: Self-pay

## 2024-09-13 ENCOUNTER — Other Ambulatory Visit: Payer: Self-pay

## 2024-09-13 NOTE — Progress Notes (Signed)
 Specialty Pharmacy Refill Coordination Note  Spoke with Natasha Graham is a 64 y.o. female contacted today regarding refills of specialty medication(s) Abemaciclib  (VERZENIO )  Doses on hand: 11 (22 tabs)   Patient requested: Pickup at [facility] at [place] date: 09/15/24  Medication will be filled on 09/14/24

## 2024-09-13 NOTE — Telephone Encounter (Signed)
 Oral Oncology Patient Advocate Encounter   Was successful in obtaining a copay card for Verzenio .  This copay card will make the patients copay $0.00.   The billing information is as follows and has been shared with [facility].   RxBin: [id number] PCN: [id number] Member ID: [id number] Group ID: [id number]    Charlott Hamilton,  CPhT-Adv  she/her/hers [facility]  [facility] Specialty Pharmacy Services Pharmacy Technician Patient Advocate Specialist III [facility] Phone: [phone]  Fax: [phone] [email or web address]

## 2024-09-14 ENCOUNTER — Other Ambulatory Visit: Payer: Self-pay | Admitting: Hematology

## 2024-09-14 ENCOUNTER — Other Ambulatory Visit: Payer: Self-pay

## 2024-09-15 ENCOUNTER — Ambulatory Visit: Payer: BC Managed Care – PPO | Admitting: Plastic Surgery

## 2024-09-17 ENCOUNTER — Other Ambulatory Visit: Payer: Self-pay | Admitting: Hematology

## 2024-09-25 ENCOUNTER — Other Ambulatory Visit: Payer: Self-pay

## 2024-09-25 ENCOUNTER — Telehealth: Payer: Self-pay

## 2024-09-25 NOTE — Telephone Encounter (Signed)
 Oral Oncology Patient Advocate Encounter   Received notification that prior authorization for Verzenio  is required.   PA submitted on 09/25/24 Key [id number] Status is pending      Charlott Hamilton,  CPhT-Adv  she/her/hers [facility]  [facility] Specialty Pharmacy Services Pharmacy Technician Patient Advocate Specialist III [facility] Phone: [phone]  Fax: [phone] [email or web address]

## 2024-09-26 ENCOUNTER — Other Ambulatory Visit: Payer: Self-pay

## 2024-09-26 DIAGNOSIS — C7951 Secondary malignant neoplasm of bone: Secondary | ICD-10-CM

## 2024-09-26 DIAGNOSIS — C50512 Malignant neoplasm of lower-outer quadrant of left female breast: Secondary | ICD-10-CM

## 2024-09-26 NOTE — Assessment & Plan Note (Signed)
 Metastatic Breast Cancer to bones, lobular carcinoma, ER+/PR+, HER2 IHC 2+ (low expression) -left breast cancer initially diagnosed 07/12/19. S/p left mastectomy on 09/27/19 showed stage IIIA p(T3, N1) ILC, SLND on 11/13/19 was negative (0/7). Mammaprint was low risk. -she received postmastectomy radiation 12/26/19 - 02/06/20 and took tamoxifen  02/29/20 - 07/2020 -genetics 06/27/20 were negative. -she was found to have bony metastatic disease in 07/2020, confirmed with bone biopsy 08/06/20. -she began Xgeva  and fulvestrant  on 08/15/20 and palbociclib  on 09/12/20.  -FoundationOne testing obtained on her 07/2020 biopsy showed no targetable mutation.  -PET scan on 12/22/2022 showed treated diffuse bone mets, no hypermetabolic uptake  -Due to high co-pay, Ibrance  was switched to Verzenio  in February 2024, she is tolerating well with low-dose 100 mg twice daily, with occasional diarrhea. -Per patient's request, I changed fulvestrant  injection to anastrozole  in October 2024. -continue Xgeva  every 3 months  - She underwent bilateral reconstruction revision surgery in July 2025. -PET 06/19/2024 showed non-hypermetabolic stable bone lesions.

## 2024-09-26 NOTE — Telephone Encounter (Signed)
 Oral Oncology Patient Advocate Encounter  Prior Authorization renewal for Verzenio  has been approved.    PA# [id number] Effective dates: 09/25/24 through 09/25/25     Charlott Hamilton,  CPhT-Adv  she/her/hers [place]  [facility] Specialty Pharmacy Services Pharmacy Technician Patient Advocate Specialist III [facility] Phone: [phone]  Fax: [phone] [email or web address]

## 2024-09-27 ENCOUNTER — Inpatient Hospital Stay: Admitting: Hematology

## 2024-09-27 ENCOUNTER — Inpatient Hospital Stay

## 2024-09-27 ENCOUNTER — Inpatient Hospital Stay: Attending: Hematology

## 2024-09-27 VITALS — BP 150/81 | HR 110 | Temp 98.0°F | Resp 17 | Wt 173.5 lb

## 2024-09-27 DIAGNOSIS — C7951 Secondary malignant neoplasm of bone: Secondary | ICD-10-CM

## 2024-09-27 DIAGNOSIS — Z170 Estrogen receptor positive status [ER+]: Secondary | ICD-10-CM

## 2024-09-27 LAB — CBC WITH DIFFERENTIAL (CANCER CENTER ONLY)
Abs Immature Granulocytes: 0.01 10*3/uL (ref 0.00–0.07)
Basophils Absolute: 0 10*3/uL (ref 0.0–0.1)
Basophils Relative: 1 %
Eosinophils Absolute: 0 10*3/uL (ref 0.0–0.5)
Eosinophils Relative: 1 %
HCT: 35.7 % — ABNORMAL LOW (ref 36.0–46.0)
Hemoglobin: 13.1 g/dL (ref 12.0–15.0)
Immature Granulocytes: 0 %
Lymphocytes Relative: 29 %
Lymphs Abs: 1 10*3/uL (ref 0.7–4.0)
MCH: 36.5 pg — ABNORMAL HIGH (ref 26.0–34.0)
MCHC: 36.7 g/dL — ABNORMAL HIGH (ref 30.0–36.0)
MCV: 99.4 fL (ref 80.0–100.0)
Monocytes Absolute: 0.3 10*3/uL (ref 0.1–1.0)
Monocytes Relative: 8 %
Neutro Abs: 2 10*3/uL (ref 1.7–7.7)
Neutrophils Relative %: 61 %
Platelet Count: 262 10*3/uL (ref 150–400)
RBC: 3.59 MIL/uL — ABNORMAL LOW (ref 3.87–5.11)
RDW: 12 % (ref 11.5–15.5)
WBC Count: 3.3 10*3/uL — ABNORMAL LOW (ref 4.0–10.5)
nRBC: 0 % (ref 0.0–0.2)

## 2024-09-27 LAB — CMP (CANCER CENTER ONLY)
ALT: 33 U/L (ref 0–44)
AST: 30 U/L (ref 15–41)
Albumin: 4.6 g/dL (ref 3.5–5.0)
Alkaline Phosphatase: 52 U/L (ref 38–126)
Anion gap: 12 (ref 5–15)
BUN: 16 mg/dL (ref 8–23)
CO2: 28 mmol/L (ref 22–32)
Calcium: 10.1 mg/dL (ref 8.9–10.3)
Chloride: 100 mmol/L (ref 98–111)
Creatinine: 1.03 mg/dL — ABNORMAL HIGH (ref 0.44–1.00)
GFR, Estimated: >60 mL/min
Glucose, Bld: 104 mg/dL — ABNORMAL HIGH (ref 70–99)
Potassium: 3.4 mmol/L — ABNORMAL LOW (ref 3.5–5.1)
Sodium: 140 mmol/L (ref 135–145)
Total Bilirubin: 0.4 mg/dL (ref 0.0–1.2)
Total Protein: 7.4 g/dL (ref 6.5–8.1)

## 2024-09-27 MED ORDER — CLOTRIMAZOLE-BETAMETH & ZN OX 1-0.05 & 20 % EX THPK: 1.0000 | PACK | Freq: Every day | CUTANEOUS | 0 refills | Status: AC | PRN

## 2024-09-27 NOTE — Progress Notes (Signed)
 " [facility]   [phone] Fax:([phone]   Clinic Follow up Note   Patient Care Team: Hamrick, Charlene CROME, MD as PCP - General (Family Medicine) Ebbie Cough, MD as Consulting Physician (General Surgery) Tyree Nanetta SAILOR, RN as Registered Nurse Dillingham, Estefana RAMAN, DO as Attending Physician (Plastic Surgery) Shannon Agent, MD as Consulting Physician (Radiation Oncology) Gorge Ade, MD (Inactive) as Consulting Physician (Obstetrics and Gynecology) Lucila Norleen LABOR, RPH-CPP (Pharmacist) Lanny Callander, MD as Consulting Physician (Medical Oncology)  Date of Service:  09/27/2024  CHIEF COMPLAINT: f/u of metastatic breast cancer  CURRENT THERAPY:  Anastrozole  and Verzenio  Xgeva , will change to every 6 months  Oncology History   Malignant neoplasm metastatic to bone [facility]) Metastatic Breast Cancer to bones, lobular carcinoma, ER+/PR+, HER2 IHC 2+ (low expression) -left breast cancer initially diagnosed 07/12/19. S/p left mastectomy on 09/27/19 showed stage IIIA p(T3, N1) ILC, SLND on 11/13/19 was negative (0/7). Mammaprint was low risk. -she received postmastectomy radiation 12/26/19 - 02/06/20 and took tamoxifen  02/29/20 - 07/2020 -genetics 06/27/20 were negative. -she was found to have bony metastatic disease in 07/2020, confirmed with bone biopsy 08/06/20. -she began Xgeva  and fulvestrant  on 08/15/20 and palbociclib  on 09/12/20.  -[employer] testing obtained on her 07/2020 biopsy showed no targetable mutation.  -PET scan on 12/22/2022 showed treated diffuse bone mets, no hypermetabolic uptake  -Due to high co-pay, Ibrance  was switched to Verzenio  in February 2024, she is tolerating well with low-dose 100 mg twice daily, with occasional diarrhea. -Per patient's request, I changed fulvestrant  injection to anastrozole  in October 2024. -continue Xgeva  every 3 months  - She underwent bilateral reconstruction revision surgery in July 2025. -PET 06/19/2024  showed non-hypermetabolic stable bone lesions.   Assessment & Plan Metastatic breast cancer with bone involvement Metastatic breast carcinoma with osseous metastases, currently well-managed on anastrozole  and Visenya. She has received Xgeva  for four years for bone protection, with the dosing interval extended to every six months due to concerns regarding long-term adverse effects, including osteonecrosis of the jaw and atypical fractures. Insurance changes are impacting Xgeva  access, with a plan to transition to Zometa after five years of Xgeva  to mitigate rapid bone loss upon discontinuation. No new bone pain, swelling, or skeletal complications. Last PET scan (October) demonstrated stable disease. She tolerates systemic therapy with mild cytopenias and oral mucositis. - Changed Xgeva  dosing interval to every six months. - Deferred Xgeva  injection today due to insurance coverage; plan to administer next dose in April/May after Medicare transition. - Continue Xgeva  for up to five years total (one or two more doses anticipated). - Transition to Zometa (IV infusion every six months for two years) after Xgeva  completion to reduce risk of bone loss for 2 years. - Ordered PET scan for April/May to monitor disease status. - Labs (including calcium) to be obtained one week prior to PET scan and injection visit. - Continue anastrozole  and Visenya as tolerated. - Monitor for adverse effects of bone-modifying agents, including osteonecrosis of the jaw and atypical fractures.   Oral mucositis due to cancer therapy Mild oral mucositis, with occasional mouth ulcers exacerbated by spicy foods. She uses sensitive mouthwash as recommended by her dentist. Mild leukopenia increases risk for mucosal infection, but no current evidence of infection or severe mucositis. - Continue sensitive mouthwash as needed for oral discomfort. - Reassured regarding current oral findings; no additional intervention  required.  Plan - She is clinically doing well, will continue current therapy. - Due to her insurance  coverage, we will cancel her Xgeva  injection today, and postponed to 3 months, she will use her Medicare insurance at that time. - Follow-up in 3 months with Xgeva  injection, lab and PET scan a week before   SUMMARY OF ONCOLOGIC HISTORY: Oncology History Overview Note   Cancer Staging  Malignant neoplasm metastatic to bone [facility]) Staging form: Bone - Appendicular Skeleton, Trunk, Skull, and Facial Bones, AJCC 8th Edition - Clinical: Stage IVB (cT3, cN1, cM1b) - Signed by Layla Sandria BROCKS, MD on 08/15/2020  Malignant neoplasm of lower-outer quadrant of left breast of female, estrogen receptor positive (HCC) Staging form: Breast, AJCC 8th Edition - Clinical: Stage IIIA (cT3, cN1, cM0, G2, ER+, PR-, HER2-) - Signed by Layla Sandria BROCKS, MD on 08/15/2020     Breast neoplasm, Tis (DCIS), right (Resolved)  05/16/2014 Initial Diagnosis   Breast neoplasm, Tis (DCIS), right   06/27/2020 Genetic Testing   Negative genetic testing on the CancerNext-Expanded+RNAinsight panel testing.  The CancerNext-Expanded gene panel offered by [facility] and includes sequencing and rearrangement analysis for the following 77 genes: AIP, ALK, APC*, ATM*, AXIN2, BAP1, BARD1, BLM, BMPR1A, BRCA1*, BRCA2*, BRIP1*, CDC73, CDH1*, CDK4, CDKN1B, CDKN2A, CHEK2*, CTNNA1, DICER1, FANCC, FH, FLCN, GALNT12, KIF1B, LZTR1, MAX, MEN1, MET, MLH1*, MSH2*, MSH3, MSH6*, MUTYH*, NBN, NF1*, NF2, NTHL1, PALB2*, PHOX2B, PMS2*, POT1, PRKAR1A, PTCH1, PTEN*, RAD51C*, RAD51D*, RB1, RECQL, RET, SDHA, SDHAF2, SDHB, SDHC, SDHD, SMAD4, SMARCA4, SMARCB1, SMARCE1, STK11, SUFU, TMEM127, TP53*, TSC1, TSC2, VHL and XRCC2 (sequencing and deletion/duplication); EGFR, EGLN1, HOXB13, KIT, MITF, PDGFRA, POLD1, and POLE (sequencing only); EPCAM and GREM1 (deletion/duplication only). DNA and RNA analyses performed for * genes. The report date is  06/27/2020.   Malignant neoplasm of lower-outer quadrant of left breast of female, estrogen receptor positive (HCC)  10/18/2019 Initial Diagnosis   Malignant neoplasm of lower-outer quadrant of left breast of female, estrogen receptor positive (HCC)   08/15/2020 Cancer Staging   Staging form: Breast, AJCC 8th Edition - Clinical: Stage IIIA (cT3, cN1, cM0, G2, ER+, PR-, HER2-) - Signed by Layla Sandria BROCKS, MD on 08/15/2020   Malignant neoplasm metastatic to bone (HCC)  08/05/2020 Initial Diagnosis   Bone metastases (HCC)   08/15/2020 Cancer Staging   Staging form: Bone - Appendicular Skeleton, Trunk, Skull, and Facial Bones, AJCC 8th Edition - Clinical: Stage IVB (cT3, cN1, cM1b) - Signed by Layla Sandria BROCKS, MD on 08/15/2020   12/21/2022 PET scan    IMPRESSION: Status post bilateral breast reconstruction with left axillary lymph node dissection.   Multifocal/diffuse treated osseous metastases, as above.   Otherwise, no evidence of recurrent or metastatic disease.     12/03/2023 PET scan   IMPRESSION: No developing areas of abnormal radiotracer uptake.   The subtle uptake along the right pelvic sidewall on the prior is not well seen today it may have been bowel or ureteral related.  Extensive sclerotic bone disease once again identified. These areas are without specific abnormal radiotracer uptake. Spinal fixation rods.      Discussed the use of AI scribe software for clinical note transcription with the patient, who gave verbal consent to proceed.  History of Present Illness Natasha Graham is a 64 year old female with metastatic breast cancer to bone who presents for follow-up of disease status and management of bone-directed therapy.  She has received anastrozole , abemaciclib , and Xgeva  since December 2021. Her Xgeva  interval was recently extended to every six months. She has missed Xgeva  for three months due to loss of coverage under her current  [employer] plan and is  considering delaying the next dose until Medicare starts in February, when she expects coverage or a switch to a lower-cost alternative. Her last PET in October showed stable disease. She has no new bone pain, swelling, or back pain.  She has mild oral mucositis likely related to abemaciclib , with mouth sores worsened by spicy foods. She uses a sensitive mouthwash with good relief. She has no severe oral lesions or dental problems and keeps regular dental care.  She has recurrent painful acneiform lesions on the external nose controlled with daily 0.5% topical steroid cream, with recurrence if she skips it for one to two days. She also notes intermittent facial breakouts in the prior radiation field.     All other systems were reviewed with the patient and are negative.  MEDICAL HISTORY:  Past Medical History:  Diagnosis Date   Cancer [facility])    breast cancer left 2021 roght 2015   Family history of brain cancer    Family history of breast cancer    Family history of colon cancer    Family history of stomach cancer    History of radiation therapy last done February 06 2020   Hypertension    PMB (postmenopausal bleeding)    PONV (postoperative nausea and vomiting)    likes scopolamine  patch   Scoliosis    Wears glasses    Wears glasses     SURGICAL HISTORY: Past Surgical History:  Procedure Laterality Date   AUGMENTATION MAMMAPLASTY Right    2015 post mastectomy   AXILLARY LYMPH NODE DISSECTION Left 11/13/2019   Procedure: LEFT AXILLARY LYMPH NODE DISSECTION;  Surgeon: Ebbie Cough, MD;  Location: [facility];  Service: General;  Laterality: Left;   BREAST IMPLANT EXCHANGE Right 03/19/2021   Procedure: exchange of right saline implant;  Surgeon: Lowery Estefana RAMAN, DO;  Location: [facility];  Service: Plastics;  Laterality: Right;   BREAST RECONSTRUCTION WITH PLACEMENT OF TISSUE EXPANDER AND FLEX HD (ACELLULAR HYDRATED DERMIS) Right 05/16/2014    Procedure: IMMEDIATE RIGHT BREAST RECONSTRUCTION WITH PLACEMENT OF TISSUE EXPANDER AND FLEX HD (ACELLULAR HYDRATED DERMIS);  Surgeon: Estefana Reichert, DO;  Location: [facility];  Service: Plastics;  Laterality: Right;   BREAST RECONSTRUCTION WITH PLACEMENT OF TISSUE EXPANDER AND FLEX HD (ACELLULAR HYDRATED DERMIS) Left 09/27/2019   Procedure: LEFT BREAST RECONSTRUCTION WITH PLACEMENT OF TISSUE EXPANDER AND FLEX HD (ACELLULAR HYDRATED DERMIS);  Surgeon: Lowery Estefana RAMAN, DO;  Location: [facility];  Service: Plastics;  Laterality: Left;   BREAST SURGERY     right breast excisional biopsy   DILATATION & CURETTAGE/HYSTEROSCOPY WITH MYOSURE N/A 12/20/2020   Procedure: DILATATION & CURETTAGE/HYSTEROSCOPY WITH  MYOSURE;  Surgeon: Gorge Ade, MD;  Location: [facility];  Service: Gynecology;  Laterality: N/A;   DILATION AND CURETTAGE OF UTERUS     HYSTEROSCOPY WITH D & C N/A 09/20/2015   Procedure: DILATATION AND CURETTAGE /HYSTEROSCOPY;  Surgeon: Ade Gorge, MD;  Location: [facility];  Service: Gynecology;  Laterality: N/A;   INCISIONAL HERNIA REPAIR N/A 04/27/2023   Procedure: LAPAROSCOPIC ASSISTED INCISIONAL HERNIA WITH MESH;  Surgeon: Ebbie Cough, MD;  Location: [facility];  Service: General;  Laterality: N/A;  GEN/TAP BLOCK   LIPOSUCTION Bilateral 08/08/2014   Procedure: LIPO SUCTION ;  Surgeon: Estefana Reichert, DO;  Location: [facility];  Service: Plastics;  Laterality: Bilateral;   LIPOSUCTION WITH LIPOFILLING Bilateral 03/16/2024   Procedure: LIPOSUCTION, WITH FAT TRANSFER;  Surgeon: Lowery,  Estefana RAMAN, DO;  Location: [facility];  Service: Plastics;  Laterality: Bilateral;   MASTECTOMY Right 05/16/2014   placement of acellular dermal matrix & tissue expanders    MASTOPEXY Left 08/08/2014   Procedure:  MASTOPEXY FOR SYMMETRY;  Surgeon: Estefana Reichert, DO;  Location: [facility];  Service: Plastics;  Laterality: Left;   NIPPLE  SPARING MASTECTOMY WITH SENTINEL LYMPH NODE BIOPSY Left 09/27/2019   Procedure: LEFT NIPPLE SPARING MASTECTOMY WITH LEFT AXILLARY SENTINEL LYMPH NODE BIOPSY;  Surgeon: Ebbie Cough, MD;  Location: [facility];  Service: General;  Laterality: Left;   REDUCTION MAMMAPLASTY Left    2015   REMOVAL OF TISSUE EXPANDER AND PLACEMENT OF IMPLANT Right 08/08/2014   Procedure: REMOVAL OF RIGHT  TISSUE EXPANDERS WITH PLACEMENT OF RIGHT BREAST IMPLANTS WITH LIPO SUCTION ;  Surgeon: Estefana Reichert, DO;  Location: [facility];  Service: Plastics;  Laterality: Right;   REMOVAL OF TISSUE EXPANDER AND PLACEMENT OF IMPLANT Left 03/19/2021   Procedure: Removal of left expander for saline implant;  Surgeon: Lowery Estefana RAMAN, DO;  Location: [facility];  Service: Plastics;  Laterality: Left;  2 hours   REVISION, RECONSTRUCTION, BREAST Bilateral 03/16/2024   Procedure: REVISION, RECONSTRUCTION, BREAST;  Surgeon: Lowery Estefana RAMAN, DO;  Location: [facility];  Service: Plastics;  Laterality: Bilateral;  Fat grafting bilateral breasts with release of scar contracture left breast   ROBOTIC ASSISTED TOTAL HYSTERECTOMY WITH BILATERAL SALPINGO OOPHERECTOMY Bilateral 02/06/2021   Procedure: XI ROBOTIC ASSISTED TOTAL HYSTERECTOMY WITH BILATERAL SALPINGO OOPHORECTOMY, LYSIS of SIGMOID ADHESIONS MCCALL CUL DE PLASTY;  Surgeon: Gorge Ade, MD;  Location: [facility];  Service: Gynecology;  Laterality: Bilateral;   SCAR REVISION Left 11/13/2019   Procedure: EXCISION OF LEFT MASTECTOMY SKIN;  Surgeon: Ebbie Cough, MD;  Location:  SURGERY CENTER;  Service: General;  Laterality: Left;   scoliosis  1972   harrington rods-age 35   TONSILLECTOMY  as child    I have reviewed the social history and family history with the patient and they are unchanged from previous note.  ALLERGIES:  has no known allergies.  MEDICATIONS:  Current  Outpatient Medications  Medication Sig Dispense Refill   abemaciclib  (VERZENIO ) 100 MG tablet Take 1 tablet (100 mg total) by mouth 2 (two) times daily. 56 tablet 2   ALOE VERA PO Take 1 capsule by mouth daily.     anastrozole  (ARIMIDEX ) 1 MG tablet Take 1 tablet by mouth once daily 90 tablet 0   Ascorbic Acid (VITAMIN C) 1000 MG tablet Take 1,000 mg by mouth daily.     Calcium Carb-Cholecalciferol (CALCIUM PLUS VITAMIN D3) 600-20 MG-MCG TABS Take 1 tablet by mouth 2 (two) times daily.     cholecalciferol (VITAMIN D3) 25 MCG (1000 UNIT) tablet Take 1,000 Units by mouth daily.     Clotrimazole -Betameth & Zn Ox 1-0.05 & 20 % THPK Apply 1 Application topically daily as needed. 15 g 0   Coenzyme Q10 (COQ10) 200 MG CAPS Take 200 mg by mouth daily.     denosumab  (XGEVA ) 120 MG/1.7ML SOLN injection Inject 120 mg into the skin every 3 (three) months.     Flaxseed Oil (LINSEED OIL) OIL Take 1,200 mg by mouth daily.     hydrochlorothiazide  (HYDRODIURIL ) 25 MG tablet Take 25 mg by mouth daily.     ibuprofen  (ADVIL ) 200 MG tablet Take 400 mg by mouth every 8 (eight) hours as needed for moderate  pain.     lisinopril  (ZESTRIL ) 30 MG tablet Take 30 mg by mouth daily.     Magnesium 400 MG TABS Take 400 mg by mouth every evening.     Multiple Vitamin (MULTIVITAMIN) capsule Take 1 capsule by mouth daily.     ondansetron  (ZOFRAN -ODT) 4 MG disintegrating tablet Take 1 tablet (4 mg total) by mouth every 8 (eight) hours as needed for nausea or vomiting. 20 tablet 0   Potassium 99 MG TABS Take 99 mg by mouth in the morning and at bedtime.     Probiotic Product (PROBIOTIC DAILY PO) Take 1 capsule by mouth 2 (two) times a week.     Turmeric 500 MG CAPS Take 500 mg by mouth 2 (two) times daily.     No current facility-administered medications for this visit.    PHYSICAL EXAMINATION: ECOG PERFORMANCE STATUS: 1 - Symptomatic but completely ambulatory  Vitals:   09/27/24 0915  BP: (!) 150/81  Pulse: (!) 110   Resp: 17  Temp: 98 F (36.7 C)  SpO2: 100%   Wt Readings from Last 3 Encounters:  09/27/24 173 lb 8 oz (78.7 kg)  06/28/24 169 lb 14.4 oz (77.1 kg)  04/19/24 169 lb 9.6 oz (76.9 kg)     GENERAL:alert, no distress and comfortable SKIN: skin color, texture, turgor are normal, no rashes or significant lesions EYES: normal, Conjunctiva are pink and non-injected, sclera clear NECK: supple, thyroid  normal size, non-tender, without nodularity LYMPH:  no palpable lymphadenopathy in the cervical, axillary  LUNGS: clear to auscultation and percussion with normal breathing effort HEART: regular rate & rhythm and no murmurs and no lower extremity edema ABDOMEN:abdomen soft, non-tender and normal bowel sounds Musculoskeletal:no cyanosis of digits and no clubbing  NEURO: alert & oriented x 3 with fluent speech, no focal motor/sensory deficits  Physical Exam    LABORATORY DATA:  I have reviewed the data as listed    Latest Ref Rng & Units 09/27/2024    8:34 AM 06/28/2024    8:35 AM 03/29/2024    9:34 AM  CBC  WBC 4.0 - 10.5 K/uL 3.3  3.2  5.9   Hemoglobin 12.0 - 15.0 g/dL 86.8  87.3  87.3   Hematocrit 36.0 - 46.0 % 35.7  34.3  34.8   Platelets 150 - 400 K/uL 262  266  283         Latest Ref Rng & Units 09/27/2024    8:34 AM 06/28/2024    8:35 AM 03/29/2024    9:34 AM  CMP  Glucose 70 - 99 mg/dL 895  94  886   BUN 8 - 23 mg/dL 16  11  12    Creatinine 0.44 - 1.00 mg/dL 8.96  8.91  9.15   Sodium 135 - 145 mmol/L 140  139  140   Potassium 3.5 - 5.1 mmol/L 3.4  3.6  3.3   Chloride 98 - 111 mmol/L 100  101  102   CO2 22 - 32 mmol/L 28  31  32   Calcium 8.9 - 10.3 mg/dL 89.8  9.7  9.5   Total Protein 6.5 - 8.1 g/dL 7.4  7.1  7.1   Total Bilirubin 0.0 - 1.2 mg/dL 0.4  0.4  0.5   Alkaline Phos 38 - 126 U/L 52  45  55   AST 15 - 41 U/L 30  21  20    ALT 0 - 44 U/L 33  26  26  RADIOGRAPHIC STUDIES: I have personally reviewed the radiological images as listed and agreed with  the findings in the report. No results found.    Orders Placed This Encounter  Procedures   NM PET Image Restag (PS) Skull Base To Thigh    Standing Status:   Future    Expected Date:   12/19/2024    Expiration Date:   09/27/2025    If indicated for the ordered procedure, I authorize the administration of a radiopharmaceutical per Radiology protocol:   Yes    Preferred imaging location?:   [facility]   All questions were answered. The patient knows to call the clinic with any problems, questions or concerns. No barriers to learning was detected. The total time spent in the appointment was 30 minutes, including review of chart and various tests results, discussions about plan of care and coordination of care plan     Onita Mattock, MD 09/27/2024     "

## 2024-09-28 ENCOUNTER — Other Ambulatory Visit (HOSPITAL_COMMUNITY): Payer: Self-pay

## 2024-10-06 ENCOUNTER — Encounter: Payer: Self-pay | Admitting: Hematology

## 2024-10-06 ENCOUNTER — Other Ambulatory Visit: Payer: Self-pay

## 2024-12-20 ENCOUNTER — Inpatient Hospital Stay

## 2024-12-27 ENCOUNTER — Inpatient Hospital Stay: Admitting: Hematology

## 2024-12-27 ENCOUNTER — Inpatient Hospital Stay

## 2025-04-03 ENCOUNTER — Ambulatory Visit: Admitting: Plastic Surgery
